# Patient Record
Sex: Female | Born: 1935
Health system: Southern US, Community
[De-identification: ages and names within clinical notes are randomized; demographics above are authoritative.]

## PROBLEM LIST (undated history)

## (undated) DIAGNOSIS — G8929 Other chronic pain: Secondary | ICD-10-CM

## (undated) DIAGNOSIS — E7800 Pure hypercholesterolemia, unspecified: Secondary | ICD-10-CM

## (undated) DIAGNOSIS — M2040 Other hammer toe(s) (acquired), unspecified foot: Secondary | ICD-10-CM

## (undated) DIAGNOSIS — I482 Chronic atrial fibrillation, unspecified: Secondary | ICD-10-CM

## (undated) DIAGNOSIS — F329 Major depressive disorder, single episode, unspecified: Secondary | ICD-10-CM

## (undated) DIAGNOSIS — I340 Nonrheumatic mitral (valve) insufficiency: Secondary | ICD-10-CM

## (undated) DIAGNOSIS — M1990 Unspecified osteoarthritis, unspecified site: Secondary | ICD-10-CM

## (undated) DIAGNOSIS — R296 Repeated falls: Secondary | ICD-10-CM

## (undated) DIAGNOSIS — F32A Depression, unspecified: Secondary | ICD-10-CM

## (undated) DIAGNOSIS — M549 Dorsalgia, unspecified: Secondary | ICD-10-CM

## (undated) DIAGNOSIS — I10 Essential (primary) hypertension: Secondary | ICD-10-CM

## (undated) DIAGNOSIS — I2510 Atherosclerotic heart disease of native coronary artery without angina pectoris: Secondary | ICD-10-CM

## (undated) DIAGNOSIS — E785 Hyperlipidemia, unspecified: Secondary | ICD-10-CM

## (undated) DIAGNOSIS — I4821 Permanent atrial fibrillation: Secondary | ICD-10-CM

## (undated) DIAGNOSIS — Z9289 Personal history of other medical treatment: Secondary | ICD-10-CM

## (undated) DIAGNOSIS — D649 Anemia, unspecified: Secondary | ICD-10-CM

## (undated) DIAGNOSIS — I119 Hypertensive heart disease without heart failure: Secondary | ICD-10-CM

## (undated) DIAGNOSIS — H3530 Unspecified macular degeneration: Secondary | ICD-10-CM

## (undated) DIAGNOSIS — J189 Pneumonia, unspecified organism: Secondary | ICD-10-CM

## (undated) DIAGNOSIS — Z7901 Long term (current) use of anticoagulants: Secondary | ICD-10-CM

## (undated) DIAGNOSIS — I272 Pulmonary hypertension, unspecified: Secondary | ICD-10-CM

## (undated) HISTORY — DX: Chronic atrial fibrillation, unspecified: I48.20

## (undated) HISTORY — PX: TOTAL HIP ARTHROPLASTY: SHX124

## (undated) HISTORY — PX: CARDIAC CATHETERIZATION: SHX172

## (undated) HISTORY — DX: Depression, unspecified: F32.A

## (undated) HISTORY — DX: Pulmonary hypertension, unspecified: I27.20

## (undated) HISTORY — DX: Dorsalgia, unspecified: M54.9

## (undated) HISTORY — DX: Major depressive disorder, single episode, unspecified: F32.9

## (undated) HISTORY — DX: Other chronic pain: G89.29

## (undated) HISTORY — DX: Pure hypercholesterolemia, unspecified: E78.00

## (undated) HISTORY — PX: ABDOMINAL HYSTERECTOMY: SHX81

## (undated) HISTORY — DX: Hypertensive heart disease without heart failure: I11.9

## (undated) HISTORY — DX: Nonrheumatic mitral (valve) insufficiency: I34.0

---

## 2001-11-07 HISTORY — PX: CATARACT EXTRACTION W/ INTRAOCULAR LENS  IMPLANT, BILATERAL: SHX1307

## 2002-02-04 ENCOUNTER — Ambulatory Visit (HOSPITAL_COMMUNITY): Admission: RE | Admit: 2002-02-04 | Discharge: 2002-02-04 | Payer: Self-pay | Admitting: Ophthalmology

## 2002-02-04 ENCOUNTER — Encounter: Payer: Self-pay | Admitting: Ophthalmology

## 2002-02-04 HISTORY — PX: PARS PLANA VITRECTOMY W/ REPAIR OF MACULAR HOLE: SHX2170

## 2002-02-20 ENCOUNTER — Other Ambulatory Visit: Admission: RE | Admit: 2002-02-20 | Discharge: 2002-02-20 | Payer: Self-pay | Admitting: Obstetrics and Gynecology

## 2002-04-16 ENCOUNTER — Encounter: Payer: Self-pay | Admitting: Obstetrics and Gynecology

## 2002-04-16 ENCOUNTER — Encounter: Admission: RE | Admit: 2002-04-16 | Discharge: 2002-04-16 | Payer: Self-pay | Admitting: Obstetrics and Gynecology

## 2002-05-03 ENCOUNTER — Ambulatory Visit: Admission: RE | Admit: 2002-05-03 | Discharge: 2002-05-03 | Payer: Self-pay | Admitting: Obstetrics and Gynecology

## 2002-05-03 HISTORY — PX: OTHER SURGICAL HISTORY: SHX169

## 2004-05-20 ENCOUNTER — Encounter: Admission: RE | Admit: 2004-05-20 | Discharge: 2004-05-20 | Payer: Self-pay | Admitting: Obstetrics and Gynecology

## 2004-05-31 ENCOUNTER — Ambulatory Visit (HOSPITAL_COMMUNITY): Admission: RE | Admit: 2004-05-31 | Discharge: 2004-05-31 | Payer: Self-pay | Admitting: Gastroenterology

## 2005-04-28 ENCOUNTER — Encounter: Admission: RE | Admit: 2005-04-28 | Discharge: 2005-04-28 | Payer: Self-pay | Admitting: Orthopedic Surgery

## 2005-05-05 ENCOUNTER — Observation Stay (HOSPITAL_COMMUNITY): Admission: RE | Admit: 2005-05-05 | Discharge: 2005-05-06 | Payer: Self-pay | Admitting: Orthopedic Surgery

## 2005-05-05 HISTORY — PX: OTHER SURGICAL HISTORY: SHX169

## 2005-12-28 ENCOUNTER — Encounter
Admission: RE | Admit: 2005-12-28 | Discharge: 2005-12-28 | Payer: Self-pay | Admitting: Physical Medicine and Rehabilitation

## 2006-05-22 ENCOUNTER — Encounter: Admission: RE | Admit: 2006-05-22 | Discharge: 2006-05-22 | Payer: Self-pay | Admitting: Obstetrics and Gynecology

## 2008-01-15 ENCOUNTER — Emergency Department (HOSPITAL_COMMUNITY): Admission: EM | Admit: 2008-01-15 | Discharge: 2008-01-15 | Payer: Self-pay | Admitting: Emergency Medicine

## 2008-02-26 ENCOUNTER — Encounter: Admission: RE | Admit: 2008-02-26 | Discharge: 2008-02-26 | Payer: Self-pay | Admitting: Internal Medicine

## 2008-05-22 ENCOUNTER — Encounter: Admission: RE | Admit: 2008-05-22 | Discharge: 2008-05-22 | Payer: Self-pay | Admitting: Obstetrics and Gynecology

## 2008-06-05 ENCOUNTER — Encounter: Admission: RE | Admit: 2008-06-05 | Discharge: 2008-06-05 | Payer: Self-pay | Admitting: Internal Medicine

## 2008-06-05 IMAGING — CR DG CHEST 2V
2 series · 2 of 2 positions shown · non-contrast
Comparison: PA and lateral chest [DATE].

CLINICAL DATA: Shortness of breath.

CHEST - 2 VIEW

[view not recorded (1 of 2)]
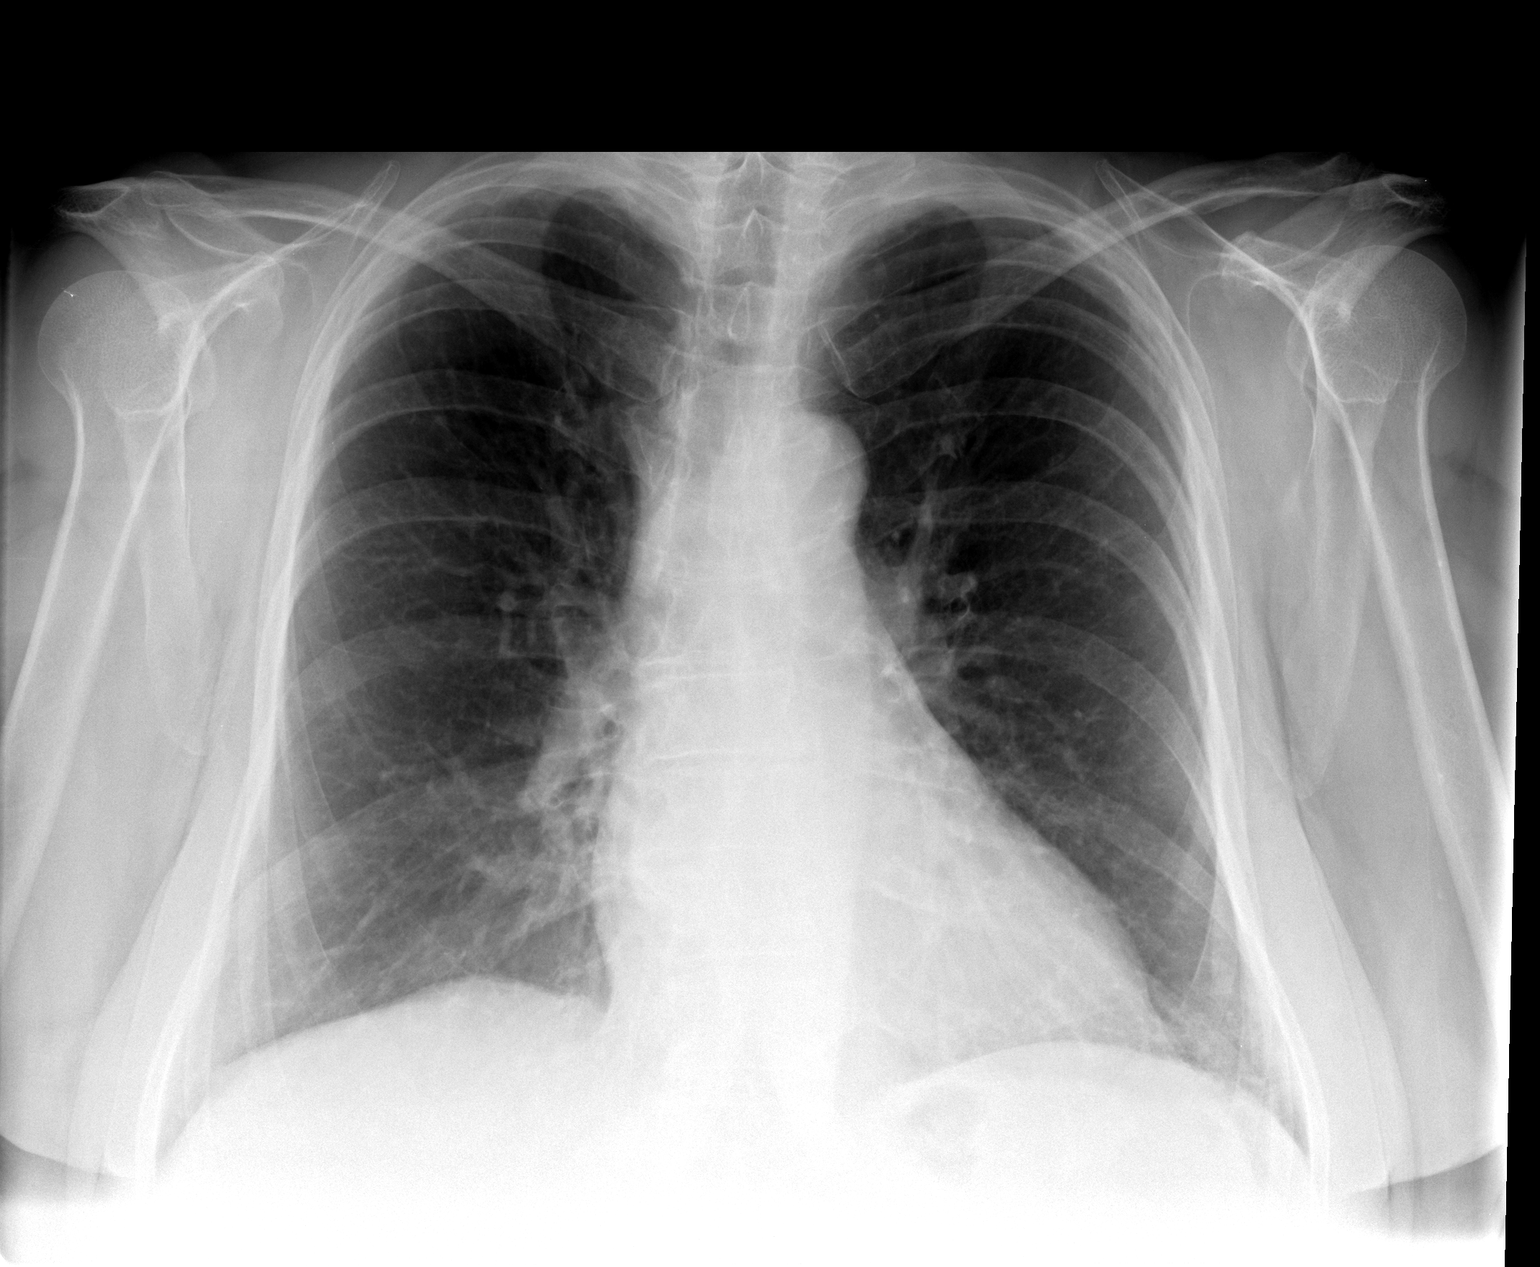

[view not recorded (2 of 2)]
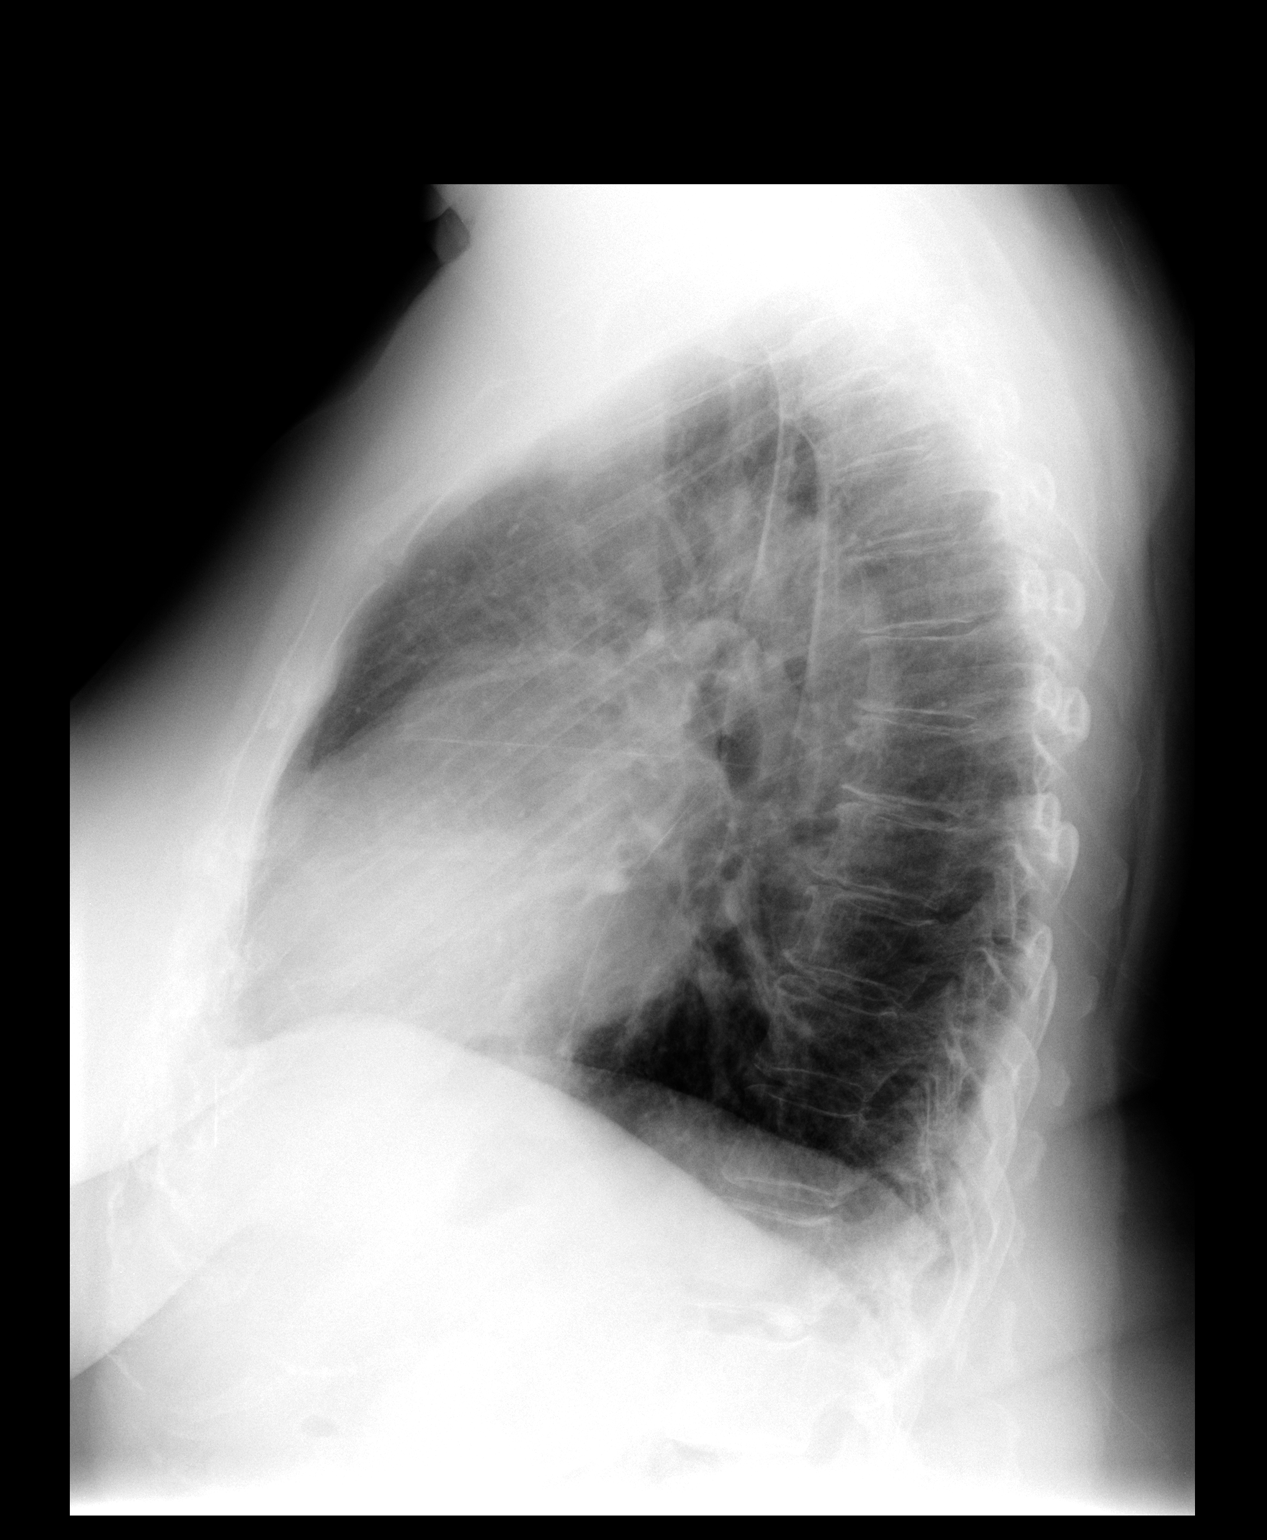

[2 of 2 positions shown; findings below may reference images not displayed]

FINDINGS: There is some minimal left base atelectasis or scar.
Lungs otherwise clear.  No effusion.  Heart size normal.
IMPRESSION: No acute disease.

## 2008-06-23 ENCOUNTER — Encounter: Admission: RE | Admit: 2008-06-23 | Discharge: 2008-06-23 | Payer: Self-pay | Admitting: Internal Medicine

## 2008-06-27 ENCOUNTER — Emergency Department (HOSPITAL_COMMUNITY): Admission: EM | Admit: 2008-06-27 | Discharge: 2008-06-28 | Payer: Self-pay | Admitting: Emergency Medicine

## 2008-09-25 ENCOUNTER — Other Ambulatory Visit: Payer: Self-pay | Admitting: Orthopedic Surgery

## 2008-10-27 ENCOUNTER — Ambulatory Visit (HOSPITAL_COMMUNITY): Admission: RE | Admit: 2008-10-27 | Discharge: 2008-10-27 | Payer: Self-pay | Admitting: Orthopedic Surgery

## 2008-10-27 HISTORY — PX: OTHER SURGICAL HISTORY: SHX169

## 2009-01-09 ENCOUNTER — Encounter: Admission: RE | Admit: 2009-01-09 | Discharge: 2009-01-09 | Payer: Self-pay | Admitting: Internal Medicine

## 2009-01-26 ENCOUNTER — Ambulatory Visit (HOSPITAL_COMMUNITY): Admission: RE | Admit: 2009-01-26 | Discharge: 2009-01-26 | Payer: Self-pay | Admitting: Cardiology

## 2009-02-03 ENCOUNTER — Inpatient Hospital Stay (HOSPITAL_BASED_OUTPATIENT_CLINIC_OR_DEPARTMENT_OTHER): Admission: RE | Admit: 2009-02-03 | Discharge: 2009-02-03 | Payer: Self-pay | Admitting: Cardiology

## 2009-02-17 ENCOUNTER — Encounter: Payer: Self-pay | Admitting: Cardiology

## 2009-02-17 ENCOUNTER — Ambulatory Visit (HOSPITAL_COMMUNITY): Admission: RE | Admit: 2009-02-17 | Discharge: 2009-02-17 | Payer: Self-pay | Admitting: Cardiology

## 2009-11-22 IMAGING — US US CAROTID DUPLEX BILAT
1 series · 13 of 24 positions shown · non-contrast
Comparison: None available

CLINICAL DATA: HTN; ;

BILATERAL CAROTID DUPLEX ULTRASOUND
TECHNIQUE: Gray scale imaging, color Doppler and duplex ultrasound
was performed of bilateral carotid and vertebral arteries in the
neck.

[Series 1: us carotid duplex bilat · 0.08mm/px · 13 of 55 slices shown]
[im 1/55]
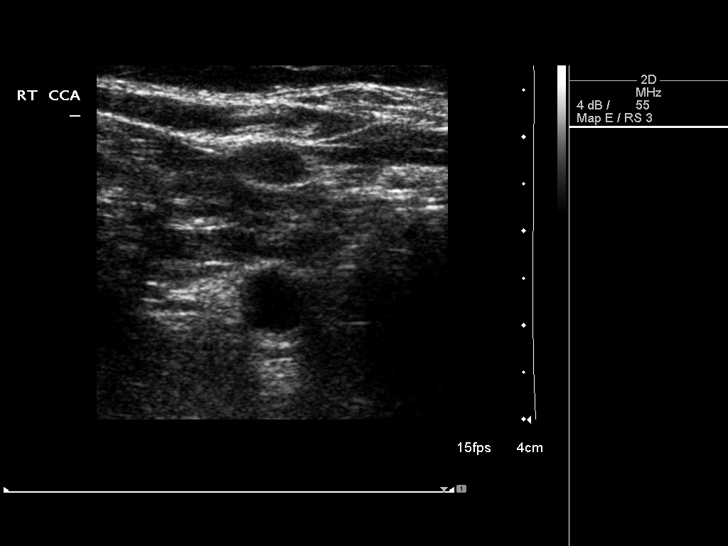
[im 5/55]
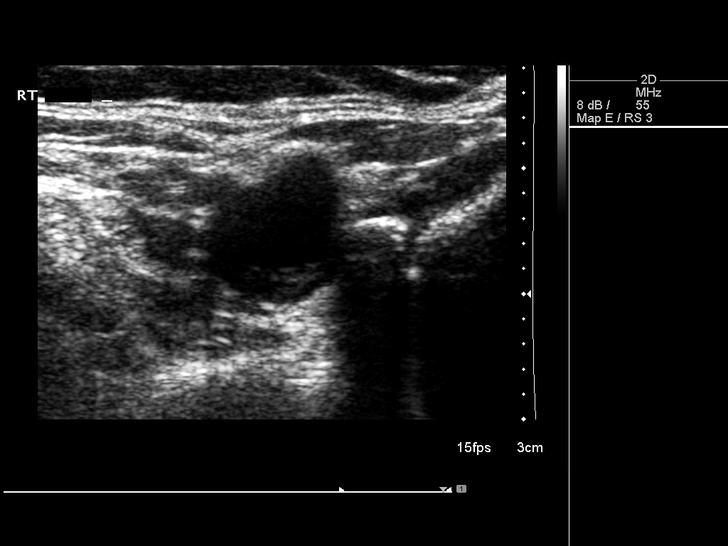
[im 10/55]
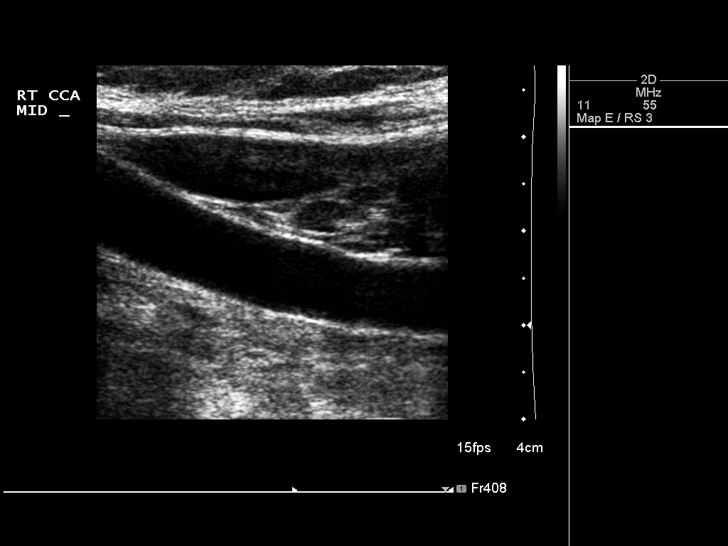
[im 15/55]
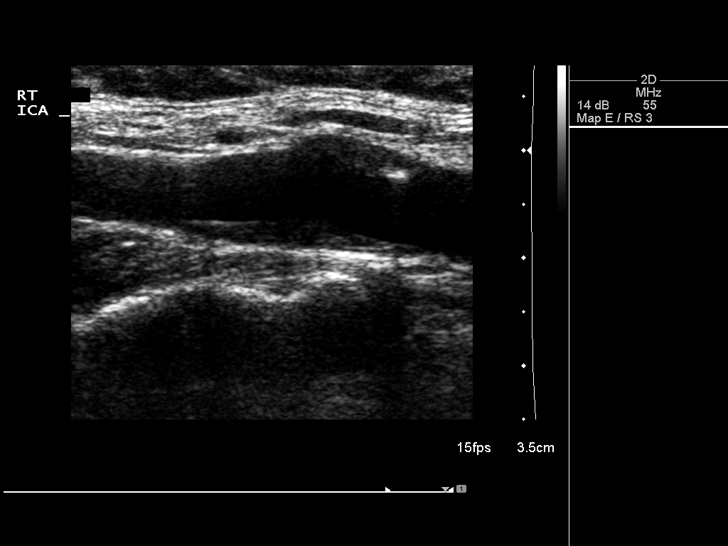
[im 19/55]
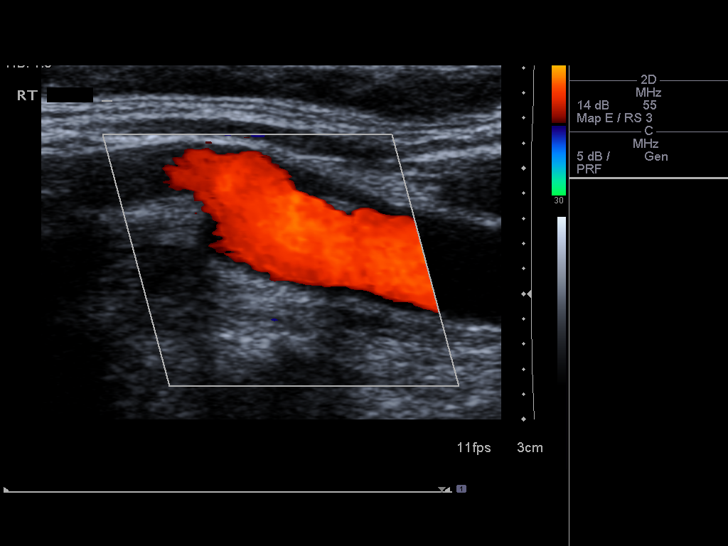
[im 24/55]
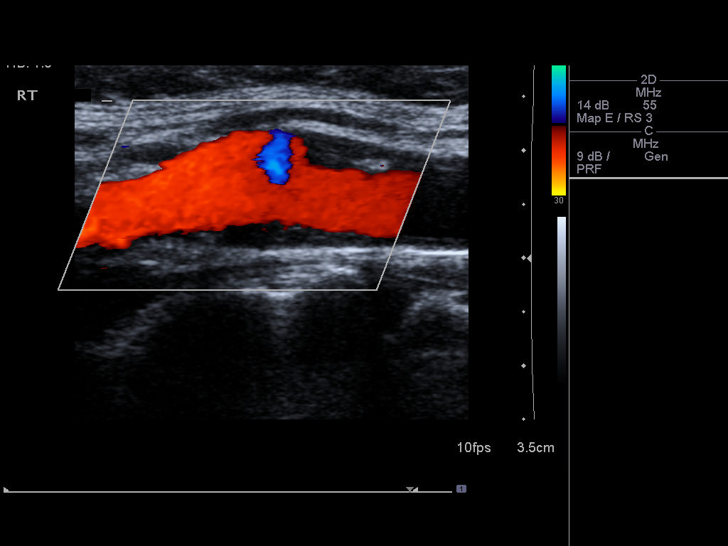
[im 29/55]
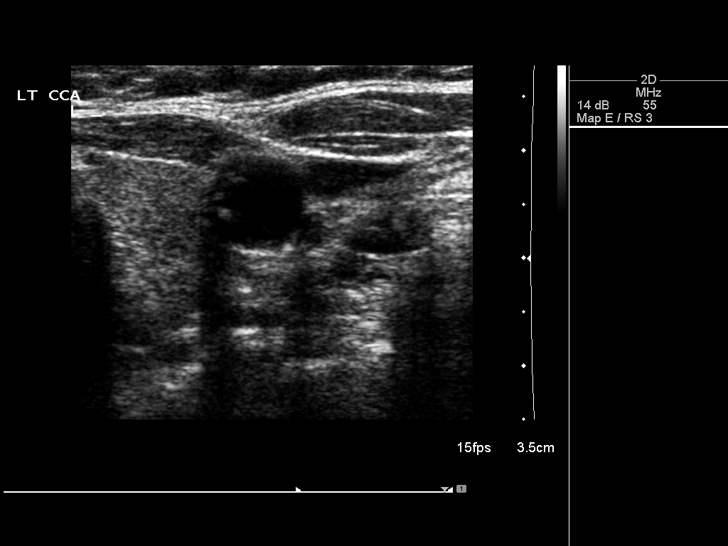
[im 31/55]
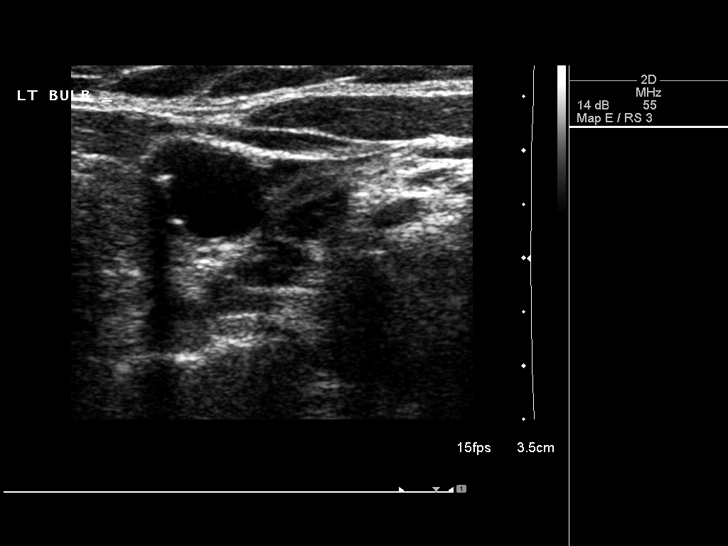
[im 36/55]
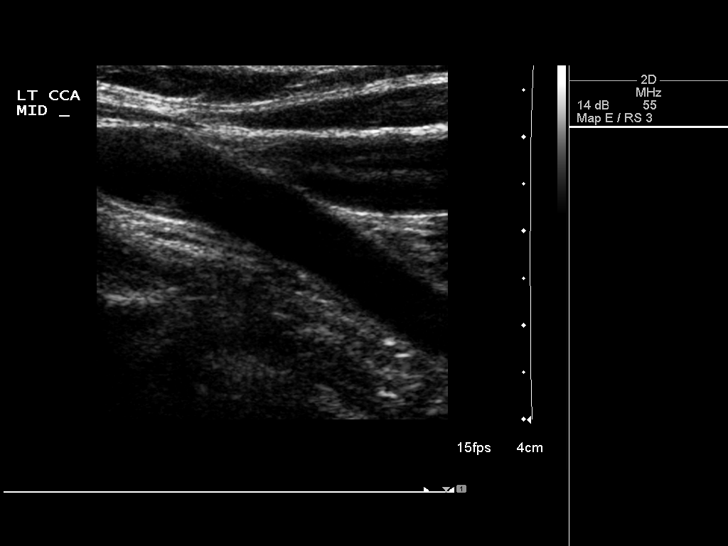
[im 40/55]
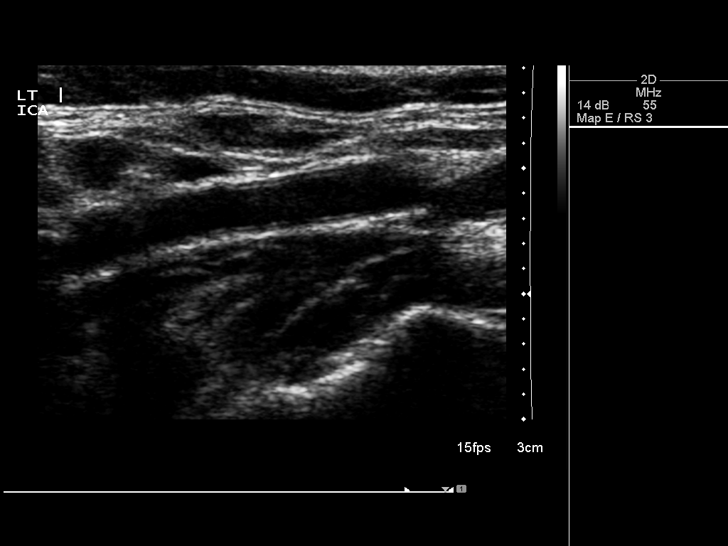
[im 45/55]
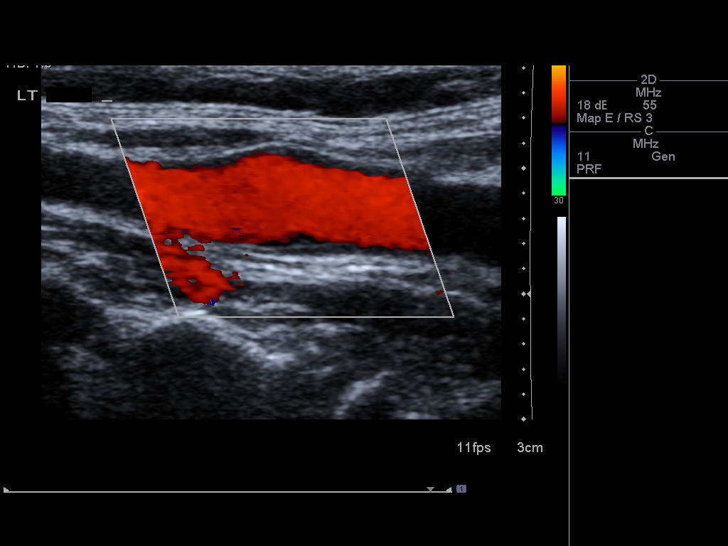
[im 50/55]
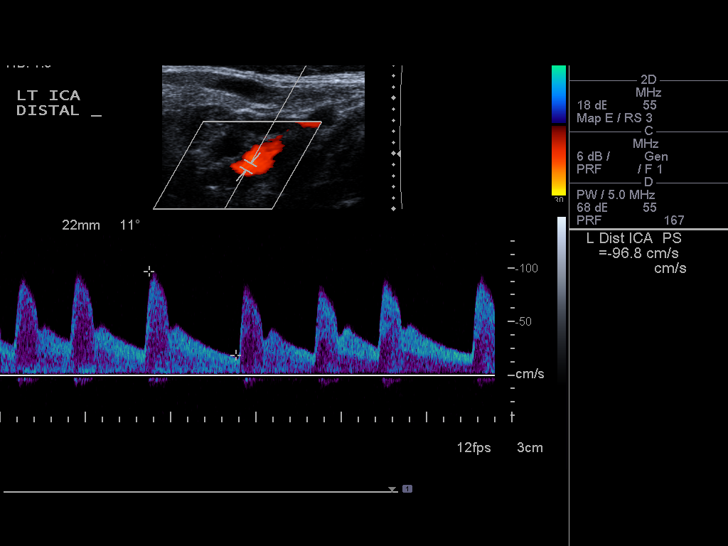
[im 55/55]
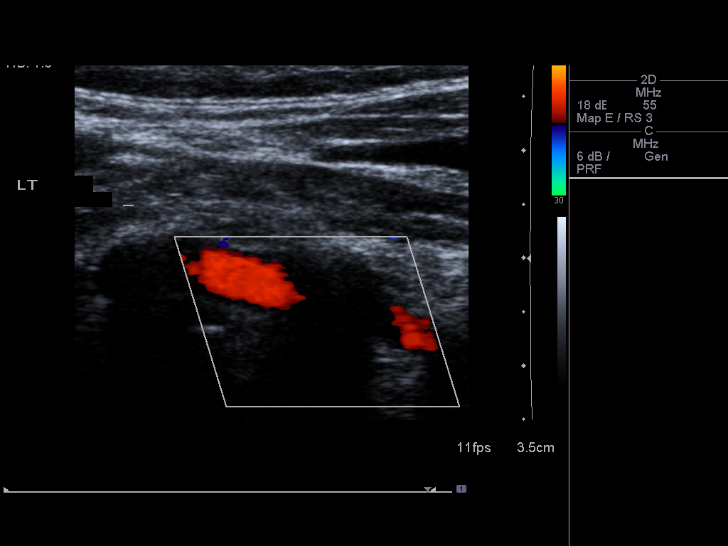

[13 of 24 positions shown; findings below may reference images not displayed]

Criteria:  Quantification of carotid stenosis is based on velocity
parameters that correlate the residual internal carotid diameter
with NASCET-based stenosis levels.

The following velocity measurements were obtained:

                 PEAK SYSTOLIC/END DIASTOLIC
RIGHT
ICA:                        75/18cm/sec
CCA:                        64/16cm/sec
SYSTOLIC ICA/CCA RATIO:
DIASTOLIC ICA/CCA RATIO:
ECA:                        73cm/sec

LEFT
ICA:                        97/19cm/sec
CCA:                        64/15cm/sec
SYSTOLIC ICA/CCA RATIO:
DIASTOLIC ICA/CCA RATIO:
ECA:                        83cm/sec
FINDINGS: RIGHT CAROTID ARTERY: Mild partially calcified carotid bifurcation
plaque without significant stenosis.

RIGHT VERTEBRAL ARTERY:  Normal flow direction and wave form

LEFT CAROTID ARTERY: Mild partially calcified carotid bifurcation
plaque extending to the proximal external carotid artery without
significant stenosis.

LEFT VERTEBRAL ARTERY:  Normal flow direction and wave form
IMPRESSION: 1.  Bilateral carotid bifurcation plaque without hemodynamically
significant stenosis.  Continued surveillance recommended.

## 2010-01-17 ENCOUNTER — Emergency Department (HOSPITAL_COMMUNITY): Admission: EM | Admit: 2010-01-17 | Discharge: 2010-01-17 | Payer: Self-pay | Admitting: Emergency Medicine

## 2010-03-25 ENCOUNTER — Encounter: Admission: RE | Admit: 2010-03-25 | Discharge: 2010-03-25 | Payer: Self-pay | Admitting: Cardiology

## 2010-06-07 ENCOUNTER — Ambulatory Visit: Payer: Self-pay | Admitting: Cardiology

## 2010-06-09 ENCOUNTER — Ambulatory Visit: Payer: Self-pay | Admitting: Cardiology

## 2010-06-19 ENCOUNTER — Emergency Department (HOSPITAL_COMMUNITY): Admission: EM | Admit: 2010-06-19 | Discharge: 2010-06-19 | Payer: Self-pay | Admitting: Emergency Medicine

## 2010-06-21 ENCOUNTER — Ambulatory Visit: Payer: Self-pay | Admitting: Cardiology

## 2010-06-29 ENCOUNTER — Ambulatory Visit: Payer: Self-pay | Admitting: Internal Medicine

## 2010-07-05 ENCOUNTER — Ambulatory Visit: Payer: Self-pay | Admitting: Cardiology

## 2010-07-19 ENCOUNTER — Ambulatory Visit: Payer: Self-pay | Admitting: Cardiology

## 2010-07-30 ENCOUNTER — Ambulatory Visit: Payer: Self-pay | Admitting: Cardiology

## 2010-08-09 ENCOUNTER — Ambulatory Visit: Payer: Self-pay | Admitting: Cardiology

## 2010-08-25 ENCOUNTER — Ambulatory Visit: Payer: Self-pay | Admitting: Cardiology

## 2010-09-22 ENCOUNTER — Ambulatory Visit: Payer: Self-pay | Admitting: Cardiology

## 2010-10-06 ENCOUNTER — Ambulatory Visit: Payer: Self-pay | Admitting: Cardiology

## 2010-10-13 ENCOUNTER — Ambulatory Visit: Payer: Self-pay | Admitting: Cardiology

## 2010-10-13 ENCOUNTER — Encounter
Admission: RE | Admit: 2010-10-13 | Discharge: 2010-10-13 | Payer: Self-pay | Source: Home / Self Care | Admitting: Obstetrics and Gynecology

## 2010-11-02 ENCOUNTER — Ambulatory Visit: Payer: Self-pay | Admitting: Cardiology

## 2010-11-24 ENCOUNTER — Ambulatory Visit: Payer: Self-pay | Admitting: Cardiology

## 2010-12-07 ENCOUNTER — Ambulatory Visit: Payer: Self-pay | Admitting: Cardiology

## 2010-12-07 NOTE — Assessment & Plan Note (Signed)
Summary: Pulmonary/ new pt eval, atypical sob/hoarseness ?ACE?   Visit Type:  Initial Consult Copy to:  Dr. Peter Sw[place]iland Primary Provider/Referring Provider:  Dr. Andi Devon  CC:  Dyspnea.  History of Present Illness: 51 yowf never smoker with HBP on ACE complicated by CAF and intermittent doe since 2010  June 29, 2010  1st pulmonary office eval c/o sev year h/o variable  sob worse on hot days and can do unlimited activity as long as cool and paces herself.  Hoarse for years and doesn't bother her. Worse with laughing or lots of voice use. Denies excess cough.   Pt denies any significant sore throat, dysphagia, itching, sneezing,  nasal congestion or excess secretions,  fever, chills, sweats, unintended wt loss, pleuritic or exertional cp, hempoptysis, orthopnea pnd or leg swelling Pt also denies any obvious fluctuation in symptoms with weather or environmental change or other alleviating or aggravating factors x for weather effects as above  Has never used inhalers  Current Medications (verified): 1)  Lisinopril 20 Mg Tabs (Lisinopril) .Marland Kitchen.. 1 Once Daily 2)  Lipitor 20 Mg Tabs (Atorvastatin Calcium) .... Take 1 Tablet By Mouth Once A Day 3)  Vitamin D .... Take 1 Tablet By Mouth Once A Day 4)  Vitamin B-6 100 Mg Tabs (Pyridoxine Hcl) .... Take 1 Tablet By Mouth Once A Day 5)  Vitamin C 1000 Mg Tabs (Ascorbic Acid) .... Take 1 Tablet By Mouth Once A Day 6)  Paxil Cr 37.5 Mg Xr24h-Tab (Paroxetine Hcl) .... Take 1 Tablet By Mouth Once A Day 7)  Vitamin A .... Take 1 Tablet By Mouth Once A Day 8)  Calcium + Vitamin D .... Take 1 Tablet By Mouth Once A Day 9)  Magnesium Oxide 400 Mg Tabs (Magnesium Oxide) .... Take 1 Tablet By Mouth Once A Day 10)  Zinc .... Take 1 Tablet By Mouth Once A Day 11)  Omega 3 Fish Oil .... Take 1 Tablet By Mouth Once A Day 12)  Metoprolol Succinate 50 Mg Xr24h-Tab (Metoprolol Succinate) .Marland Kitchen.. 1 At Bedtime 13)  Biotin 1000 Mcg Tabs (Biotin) .... Take 1  Tablet By Mouth Once A Day 14)  Hydrocodone-Acetaminophen 10-325 Mg Tabs (Hydrocodone-Acetaminophen) .... As Needed 15)  Furosemide 40 Mg Tabs (Furosemide) .... Take 2 Tabs By Mouth Each Morning and 1 Tab By Mouth Each Evening 16)  Coumadin 5 Mg Tabs (Warfarin Sodium) .... Take As Directed 17)  Cardizem 120 Mg Tabs (Diltiazem Hcl) .... Take 1 Tablet By Mouth Once A Day 18)  Premarin 1.25 Mg Tabs (Estrogens Conjugated) .... Take 1 Tablet By Mouth Once A Day 19)  Neurontin 100 Mg Caps (Gabapentin) .Marland Kitchen.. 1 Every Evening 20)  Potassium Gluconate 595 Mg Tabs (Potassium Gluconate) .... 3 Once Daily  Allergies: 1)  ! Sulfa 2)  ! Codeine  Past History:  Past Medical History: BACK PAIN, CHRONIC (ICD-724.5) HYPERCHOLESTEROLEMIA (ICD-272.0) HYPERTENSION, PULMONARY (ICD-416.8) ATRIAL FIBRILLATION (ICD-427.31) HYPERTENSIVE CARDIOVASCULAR DISEASE (ICD-402.90) MITRAL INSUFFICIENCY (ICD-396.3)...............................................Marland KitchenSwaziland    - ECHO 5/13/11Mild conc lvh, mild as, mild lae, mod pulmonary hypertension PAH     - RIGHT Ht cath 02/03/09 PA mean 39 vs Wedge 24 C0 4.4 so PVR = 272 (nl < 250) SPN  RLL by CT only    -See CT 03/25/10, c/w atx Unexplained sob/ hoarseness....................................................Marland KitchenWert     - PFT's 01/26/09 FEV1 1.59 ( 80%) ratio 73 no insp truncation    Family History: father : deceased with prostate cancer at age 88 mother deceased at age 24  with coronary disease Negative for respiratory diseases or atopy   Social History: pt is a retired Environmental health practitioner. pt is divorced. pt has 3 children. Never smoker  Review of Systems       The patient complains of shortness of breath with activity and joint stiffness or pain.  The patient denies shortness of breath at rest, productive cough, non-productive cough, coughing up blood, chest pain, irregular heartbeats, acid heartburn, indigestion, loss of appetite, weight change, abdominal pain,  difficulty swallowing, sore throat, tooth/dental problems, headaches, nasal congestion/difficulty breathing through nose, sneezing, itching, ear ache, anxiety, depression, hand/feet swelling, rash, change in color of mucus, and fever.    Vital Signs:  Patient profile:   77 year old female Height:      65 inches Weight:      173.25 pounds BMI:     28.93 O2 Sat:      91 % on Room air Temp:     98.1 degrees F oral Pulse rate:   95 / minute BP sitting:   122 / 60  (left arm)  Vitals Entered By: Vernie Murders (June 29, 2010 11:31 AM)  O2 Flow:  Room air  Physical Exam  Additional Exam:  amb very hoarse wf with classic pseudowheeze resolves with purse lip maneuver  wt 173 June 29, 2010 HEENT: nl dentition, turbinates, and orophanx. Nl external ear canals without cough reflex NECK :  without JVD/Nodes/TM/ nl carotid upstrokes bilaterally LUNGS: no acc muscle use, clear to A and P bilaterally without cough on insp or exp maneuvers CV:   IRR  no s3 or murmur or increase in P2, no edema  ABD:  soft and nontender with nl excursion in the supine position. No bruits or organomegaly, bowel sounds nl MS:  warm without deformities, calf tenderness, cyanosis or clubbing SKIN: warm and dry without lesions   NEURO:  alert, approp, no deficits     Impression & Recommendations:  Problem # 1:  HYPERTENSION, PULMONARY (ICD-416.8) Extremely mild and would not explain her variable sob or hoarseness, prob mostly related to valvular ht dz and lvh, no w/u needed at this point x perhaps overight pulse ox to r/o noct desat  Problem # 2:  DYSPNEA (ICD-786.09)  Quite variable and assoc with hoarseness so is most likely a form of  Upper airway cough syndrome, so named because it's frequently impossible to sort out how much is  CR/sinusitis with freq throat clearing (which can be related to primary GERD)   vs  causing  secondary extra esophageal GERD from wide swings in gastric pressure that occur with  throat clearing, promoting self use of mint and menthol lozenges that reduce the lower esophageal sphincter tone and exacerbate the problem further These are the same pts who not infrequently have failed to tolerate ace inhibitors,  dry powder inhalers or biphosphonates or report having reflux symptoms that don't respond to standard doses of PPI  I would avoid ace and treat possible gerd with both diet and ppi before I would consider treating her for primary airways dz given her age and lack of risk factors for asthma, including essentially nl pft's while symptomatic and the risk of worsening her afib.  I discussed all the options which she politely listened to then declined every one of them, especially when it comes to reducing or adjusting any over 20  medications so pulmonary f/u can be prn  Orders: New Patient Level V (16109)  Medications Added to Medication List This Visit:  1)  Lisinopril 20 Mg Tabs (Lisinopril) .Marland Kitchen.. 1 once daily 2)  Metoprolol Succinate 50 Mg Xr24h-tab (Metoprolol succinate) .Marland Kitchen.. 1 at bedtime 3)  Furosemide 40 Mg Tabs (Furosemide) .... Take 2 tabs by mouth each morning and 1 tab by mouth each evening 4)  Neurontin 100 Mg Caps (Gabapentin) .Marland Kitchen.. 1 every evening 5)  Potassium Gluconate 595 Mg Tabs (Potassium gluconate) .... 3 once daily   Patient Instructions: 1)  GERD (REFLUX)  is a common cause of respiratory symptoms. It commonly presents without heartburn and can be treated with medication, but also with lifestyle changes including avoidance of late meals, excessive alcohol, smoking cessation, and avoid fatty foods, chocolate, peppermint, colas, red wine, and acidic juices such as orange juice. NO MINT OR MENTHOL PRODUCTS SO NO COUGH DROPS  2)  USE SUGARLESS CANDY INSTEAD (jolley ranchers)  3)  NO OIL BASED VITAMINS (no fish oil) 4)  I strongly suspect the hoarseness and much of your breathing problems are caused by the medication you are taking with Lisnopril leading  the list  suspects  and would need to be subsituted for 6 weeks to see the difference  5)  We can see anytime on a same day basis Wert/Tammy NP if needed for breathing problems

## 2010-12-28 ENCOUNTER — Encounter (INDEPENDENT_AMBULATORY_CARE_PROVIDER_SITE_OTHER): Payer: Medicare Other

## 2010-12-28 DIAGNOSIS — I4891 Unspecified atrial fibrillation: Secondary | ICD-10-CM

## 2010-12-28 DIAGNOSIS — Z7901 Long term (current) use of anticoagulants: Secondary | ICD-10-CM

## 2011-01-24 ENCOUNTER — Encounter: Payer: Self-pay | Admitting: *Deleted

## 2011-01-24 DIAGNOSIS — I4891 Unspecified atrial fibrillation: Secondary | ICD-10-CM

## 2011-01-25 ENCOUNTER — Ambulatory Visit (INDEPENDENT_AMBULATORY_CARE_PROVIDER_SITE_OTHER): Payer: Medicare Other | Admitting: *Deleted

## 2011-01-25 DIAGNOSIS — Z7901 Long term (current) use of anticoagulants: Secondary | ICD-10-CM

## 2011-01-25 DIAGNOSIS — I4891 Unspecified atrial fibrillation: Secondary | ICD-10-CM

## 2011-01-25 LAB — POCT INR: INR: 1.4

## 2011-02-01 ENCOUNTER — Ambulatory Visit (INDEPENDENT_AMBULATORY_CARE_PROVIDER_SITE_OTHER): Payer: Medicare Other | Admitting: *Deleted

## 2011-02-01 DIAGNOSIS — Z7901 Long term (current) use of anticoagulants: Secondary | ICD-10-CM

## 2011-02-01 DIAGNOSIS — I4891 Unspecified atrial fibrillation: Secondary | ICD-10-CM

## 2011-02-01 LAB — POCT INR: INR: 1.5

## 2011-02-14 ENCOUNTER — Ambulatory Visit (INDEPENDENT_AMBULATORY_CARE_PROVIDER_SITE_OTHER): Payer: Medicare Other | Admitting: *Deleted

## 2011-02-14 DIAGNOSIS — Z7901 Long term (current) use of anticoagulants: Secondary | ICD-10-CM

## 2011-02-14 DIAGNOSIS — I4891 Unspecified atrial fibrillation: Secondary | ICD-10-CM

## 2011-02-14 LAB — POCT INR: INR: 2.6

## 2011-02-17 LAB — POCT I-STAT 3, VENOUS BLOOD GAS (G3P V)
Acid-base deficit: 1 mmol/L (ref 0.0–2.0)
Bicarbonate: 24.6 mEq/L — ABNORMAL HIGH (ref 20.0–24.0)
Bicarbonate: 25.7 mEq/L — ABNORMAL HIGH (ref 20.0–24.0)
O2 Saturation: 59 %
pCO2, Ven: 42.6 mmHg — ABNORMAL LOW (ref 45.0–50.0)
pCO2, Ven: 43.3 mmHg — ABNORMAL LOW (ref 45.0–50.0)
pH, Ven: 7.362 — ABNORMAL HIGH (ref 7.250–7.300)
pH, Ven: 7.389 — ABNORMAL HIGH (ref 7.250–7.300)
pO2, Ven: 32 mmHg (ref 30.0–45.0)
pO2, Ven: 32 mmHg (ref 30.0–45.0)

## 2011-02-17 LAB — POCT I-STAT 3, ART BLOOD GAS (G3+)
O2 Saturation: 91 %
pCO2 arterial: 37.3 mmHg (ref 35.0–45.0)
pH, Arterial: 7.424 — ABNORMAL HIGH (ref 7.350–7.400)
pO2, Arterial: 60 mmHg — ABNORMAL LOW (ref 80.0–100.0)

## 2011-02-22 ENCOUNTER — Encounter: Payer: Self-pay | Admitting: Cardiology

## 2011-02-22 DIAGNOSIS — I272 Pulmonary hypertension, unspecified: Secondary | ICD-10-CM | POA: Insufficient documentation

## 2011-02-22 DIAGNOSIS — I340 Nonrheumatic mitral (valve) insufficiency: Secondary | ICD-10-CM | POA: Insufficient documentation

## 2011-02-22 DIAGNOSIS — E785 Hyperlipidemia, unspecified: Secondary | ICD-10-CM | POA: Insufficient documentation

## 2011-02-22 DIAGNOSIS — M549 Dorsalgia, unspecified: Secondary | ICD-10-CM | POA: Insufficient documentation

## 2011-02-22 DIAGNOSIS — I119 Hypertensive heart disease without heart failure: Secondary | ICD-10-CM | POA: Insufficient documentation

## 2011-02-22 DIAGNOSIS — F329 Major depressive disorder, single episode, unspecified: Secondary | ICD-10-CM | POA: Insufficient documentation

## 2011-02-22 DIAGNOSIS — I10 Essential (primary) hypertension: Secondary | ICD-10-CM | POA: Insufficient documentation

## 2011-02-22 DIAGNOSIS — I482 Chronic atrial fibrillation, unspecified: Secondary | ICD-10-CM | POA: Insufficient documentation

## 2011-02-24 ENCOUNTER — Other Ambulatory Visit: Payer: Self-pay | Admitting: *Deleted

## 2011-02-24 ENCOUNTER — Ambulatory Visit (INDEPENDENT_AMBULATORY_CARE_PROVIDER_SITE_OTHER): Payer: Medicare Other | Admitting: Cardiology

## 2011-02-24 ENCOUNTER — Encounter: Payer: Self-pay | Admitting: Cardiology

## 2011-02-24 DIAGNOSIS — Z7901 Long term (current) use of anticoagulants: Secondary | ICD-10-CM

## 2011-02-24 DIAGNOSIS — I4891 Unspecified atrial fibrillation: Secondary | ICD-10-CM

## 2011-02-24 DIAGNOSIS — I482 Chronic atrial fibrillation, unspecified: Secondary | ICD-10-CM

## 2011-02-24 DIAGNOSIS — I059 Rheumatic mitral valve disease, unspecified: Secondary | ICD-10-CM

## 2011-02-24 DIAGNOSIS — I119 Hypertensive heart disease without heart failure: Secondary | ICD-10-CM

## 2011-02-24 DIAGNOSIS — I340 Nonrheumatic mitral (valve) insufficiency: Secondary | ICD-10-CM

## 2011-02-24 MED ORDER — DILTIAZEM HCL 120 MG PO TABS: 120.0000 mg | ORAL_TABLET | Freq: Every day | ORAL | Status: DC

## 2011-02-24 NOTE — Patient Instructions (Signed)
We will refill your diltiazem.  Continue your current medications.  We will see you back in 4 months and get fasting lab work.

## 2011-02-24 NOTE — Telephone Encounter (Signed)
Called for refill on cardizem;sent

## 2011-02-25 DIAGNOSIS — Z7901 Long term (current) use of anticoagulants: Secondary | ICD-10-CM | POA: Insufficient documentation

## 2011-02-25 NOTE — Assessment & Plan Note (Signed)
She is well compensated on exam without significant dyspnea. We will continue with her current medical therapy.

## 2011-02-25 NOTE — Assessment & Plan Note (Signed)
Rate is well controlled and she is therapeutic on her anticoagulation. She is scheduled for followup INR next week.

## 2011-02-25 NOTE — Progress Notes (Signed)
[name] Date of Birth: Aug 20, 1932   History of Present Illness: [name] is seen today for followup. She states she's doing very well. She remains reactive with her exercise program. She states the pollen doesn't really bother her very much but she has more trouble breathing when the humidity is high. She's had no recent bronchitic symptoms. She denies any chest pain.  Current Outpatient Prescriptions on File Prior to Visit  Medication Sig Dispense Refill  . Ascorbic Acid (VITAMIN C) 1000 MG tablet Take 1,000 mg by mouth daily.        Marland Kitchen atorvastatin (LIPITOR) 20 MG tablet Take 1 tablet by mouth Daily.      . Biotin 1000 MCG tablet Take 1,000 mcg by mouth daily.        . Calcium Carbonate-Vitamin D (CALCIUM + D PO) Take by mouth daily.        . Cholecalciferol (VITAMIN D PO) Take by mouth daily.        Marland Kitchen diltiazem (CARDIZEM) 120 MG tablet Take 1 tablet (120 mg total) by mouth daily.  30 tablet  5  . estrogens, conjugated, (PREMARIN) 1.25 MG tablet Take 1.25 mg by mouth daily.        . furosemide (LASIX) 40 MG tablet Take 40 mg by mouth daily. 2 PILLS IN THE AM, 1 PILL IN THE PM       . gabapentin (NEURONTIN) 100 MG capsule Take 1 tablet by mouth Daily.      Marland Kitchen HYDROcodone-acetaminophen (NORCO) 10-325 MG per tablet Take 1 tablet by mouth every 6 (six) hours as needed.        Marland Kitchen lisinopril (PRINIVIL,ZESTRIL) 20 MG tablet Take 1 tablet by mouth Daily.      Marland Kitchen lisinopril-hydrochlorothiazide (PRINZIDE,ZESTORETIC) 20-25 MG per tablet Take 1 tablet by mouth daily.        . Magnesium 400 MG CAPS Take by mouth daily.        . Multiple Vitamins-Minerals (ZINC PO) Take by mouth daily.        Marland Kitchen PARoxetine (PAXIL-CR) 37.5 MG 24 hr tablet Take 37.5 mg by mouth every morning.        . Potassium (POTASSIMIN PO) Take 595 mg by mouth daily. 3 TABLETS DAILY      . pyridOXINE (VITAMIN B-6) 100 MG tablet Take 100 mg by mouth daily.        . vitamin A 8000 UNIT capsule Take 8,000 Units by mouth  daily.        Marland Kitchen warfarin (COUMADIN) 5 MG tablet Take by mouth as directed.        Marland Kitchen DISCONTD: diltiazem (CARDIZEM CD) 120 MG 24 hr capsule Take 1 tablet by mouth Daily.        Allergies  Allergen Reactions  . Codeine   . Penicillins   . Sulfonamide Derivatives     Past Medical History  Diagnosis Date  . Mitral insufficiency     CHRONIC  . Hypertensive heart disease     WITH LVH  . LVH (left ventricular hypertrophy) due to hypertensive disease   . Chronic atrial fibrillation   . Pulmonary hypertension   . Hypercholesterolemia   . Back pain, chronic   . Depression     Past Surgical History  Procedure Date  . Total hip arthroplasty     BILATERAL  . Bladder surgery   . Abdominal hysterectomy     History  Smoking status  . Never Smoker   Smokeless tobacco  .  Never Used    History  Alcohol Use No    Family History  Problem Relation Age of Onset  . Heart disease Mother 82  . Prostate cancer Father 33    Review of Systems:   All other systems were reviewed and are negative.  Physical Exam: BP 138/80  Pulse 88  Ht 5' 4.5" (1.638 m)  Wt 157 lb (71.215 kg)  BMI 26.53 kg/m2 She is a pleasant white female in no acute distress. Her HEENT exam is unremarkable. Sclera are clear. Oropharynx is clear. Neck is supple without JVD, adenopathy, thyromegaly, or bruits. Lungs are clear. Cardiac exam reveals a regular rate and rhythm without gallop. There is a grade 2/6 systolic murmur heard best at the left sternal border. Abdomen is soft and nontender. She has no significant edema. Pedal pulses are palpable. She is alert and oriented x3. Cranial nerves II through XII are intact.  LABORATORY DATA: Recent INR was 2.6.  Assessment / Plan:

## 2011-02-25 NOTE — Assessment & Plan Note (Signed)
Blood pressure is well controlled on current therapy 

## 2011-02-28 ENCOUNTER — Ambulatory Visit (INDEPENDENT_AMBULATORY_CARE_PROVIDER_SITE_OTHER): Payer: Medicare Other | Admitting: *Deleted

## 2011-02-28 ENCOUNTER — Encounter: Payer: Medicare Other | Admitting: *Deleted

## 2011-02-28 DIAGNOSIS — I4891 Unspecified atrial fibrillation: Secondary | ICD-10-CM

## 2011-03-18 ENCOUNTER — Ambulatory Visit (INDEPENDENT_AMBULATORY_CARE_PROVIDER_SITE_OTHER): Payer: Medicare Other | Admitting: *Deleted

## 2011-03-18 DIAGNOSIS — I4891 Unspecified atrial fibrillation: Secondary | ICD-10-CM

## 2011-03-18 LAB — POCT INR: INR: 3.7

## 2011-03-22 NOTE — Op Note (Signed)
[name], [name]           ACCOUNT NO.:  [id number]   MEDICAL RECORD NO.:  [id number]          PATIENT TYPE:  AMB   LOCATION:  DAY                          FACILITY:  [facility]   PHYSICIAN:  Marlowe Kays, M.D.  DATE OF BIRTH:  [birth year]   DATE OF PROCEDURE:  10/27/2008  DATE OF DISCHARGE:                               OPERATIVE REPORT   PREOPERATIVE DIAGNOSIS:  Painful callus, inner aspect second toe,  secondary valgus deformity great toe, left foot.   POSTOPERATIVE DIAGNOSIS:  Painful callus, inner aspect second toe,  secondary valgus deformity great toe, left foot.   OPERATION/PROCEDURE:  1. Varus osteotomy proximal phalanx, great toe.  2. Paring down the callus, second toe left foot.   SURGEON:  Marlowe Kays, M.D.   ASSISTANT:  Nurse.   ANESTHESIA:  General.   INDICATIONS:  She has small bunion which she did not desire to have  corrected.  She had valgus of the great toe and which was rubbing on the  inner aspect of the second toe.  It was thought that the above procedure  would be appropriate to correct her problems.  She had cardiac clearance  prior to her surgery.   DESCRIPTION OF PROCEDURE:  Prophylactic antibiotics.  She has bilateral  total hips.  Pneumatic tourniquet to 300 mmHg with the left leg  Esmarched out non-sterilely and prepped from midcalf to toes with  DuraPrep, draped in a sterile field.  Time-out performed.  I first  gently pared down the callus over the inner aspect of the second toe  with 15 knife blade.  I then made a midline incision over the proximal  phalanx of the great toe and using the mini C-arm, placed two guide  pins, one at roughly the metaphyseal flare and the other obliquely to  this until they were in good position.  I then used a micro saw  supplemented with a very small osteotome to remove a wedge of bone from  the inner side of the great toe.  I then cracked down the osteotomy and  placed at 2.045 smooth K-wires distally  and then proximally, stabilizing  the osteotomy position. Pins were confirmed on this with a a mini C-arm.  She had a little bit of gapping left medially and I placed some of the  cancellous bone from the osteotomy in the gap area after irrigating the  wound well with sterile saline and closed the periosteal fascial complex  over with interrupted 3-0 Vicryl and skin with interrupted 4-0 nylon  mattress sutures.  The pins of the toe were blocked with 0.5% plain  Marcaine.  The  pins were bent, cut and capped.  Betadine, Adaptic, dry sterile dressing  were applied.  Tourniquet was released.  She tolerated the procedure  well and was taken to the recovery room in satisfactory condition with  no known complications.           ______________________________  Marlowe Kays, M.D.     JA/MEDQ  D:  10/27/2008  T:  10/28/2008  Job:  [id number]

## 2011-03-22 NOTE — Cardiovascular Report (Signed)
NAMELAQUILLA, [name]           ACCOUNT NO.:  [id number]   MEDICAL RECORD NO.:  [id number]          PATIENT TYPE:  OIB   LOCATION:  1961                         FACILITY:  [facility]   PHYSICIAN:  Peter M. Swaziland, M.D.  DATE OF BIRTH:  [birth year]   DATE OF PROCEDURE:  02/03/2009  DATE OF DISCHARGE:  02/03/2009                            CARDIAC CATHETERIZATION   INDICATIONS FOR PROCEDURE:  A 77 year old white female who presents with  progressive dyspnea.  She has chronic persistent atrial fibrillation.  By echocardiogram, she had evidence of moderate mitral insufficiency  with severe pulmonary hypertension and severe tricuspid insufficiency.  Cardiac catheterization was indicated to further define her hemodynamic  status.   PROCEDURES:  1. Right and left heart catheterization.  2. Coronary left ventricular angiography.  3. Access via the right femoral artery and vein using standard      Seldinger technique equipment.  4. A 4-French 4 cm left Judkins catheter, 4-French 3-D RCA catheter, 4-      French pigtail catheter, 4-French arterial sheath, 5-French venous      sheath, with 5-French balloon-tip Swan-Ganz catheter.   MEDICATIONS:  Local anesthesia with 1% Xylocaine and Versed 2 mg IV.   CONTRAST:  120 mL of Omnipaque.   HEMODYNAMIC DATA:  Thermodilution cardiac output was 3 L/min with an  index of 1.6 L/min/m2.  Cardiac output by Fick was 4.4 with an index of  2.4 L/min/m2.  Right atrial saturation was 59%.  Pulmonary artery  saturation was 60% showing no evidence of step-up.  Aortic saturation  was 91%, right atrial pressure was 19/18 with a mean of 15 mmHg, right  ventricular pressure was 61 with an EDP of 19 mmHg, coronary artery  pressure was 56/24 with a mean of 39 mmHg.  Pulmonary capillary wedge  pressure is 28/33 with a mean of 24 mmHg, left ventricular pressure was  141 with EDP of 25 mmHg.  The aortic pressure was 141/81 with a mean of  106 mmHg.   ANGIOGRAPHIC  DATA:  Left ventricular angiography was performed in the  RAO view.  This demonstrates modest mitral annular calcification.  Left  ventricular size and contractility were normal with ejection fraction  estimated at 65%.  There appears to be severe mitral insufficiency with  reflux freely into the pulmonary veins.   The left coronary artery arises and distributes normally.  The left main  coronary artery is short and appears normal.   Left anterior descending artery, large vessel and appears normal  throughout its course.  There is a very small first diagonal branch,  which has a 80-90% stenosis proximally.   The left circumflex coronary artery is a large dominant vessel and is  normal throughout.   The right coronary artery is a small-nondominant vessel appears normal.   FINAL INTERPRETATION:  1. Single-vessel obstructive coronary artery disease involving very      small diagonal branch.  2. Normal left ventricular function.  3. Severe mitral insufficiency.  4. Severe pulmonary hypertension, did elevated left ventricular      filling pressures of mitral insufficiency.  ______________________________  Peter M. [place], M.D.     PMJ/MEDQ  D:  02/03/2009  T:  02/03/2009  Job:  [id number]   cc:   Merlene Laughter. Renae Gloss, M.D.

## 2011-03-22 NOTE — H&P (Signed)
[name], MELKONIAN NO.:  [id number]   MEDICAL RECORD NO.:  [id number]           PATIENT TYPE:   LOCATION:                                 FACILITY:   PHYSICIAN:  Peter M. Swaziland, M.D.  DATE OF BIRTH:  04/22/1932   DATE OF ADMISSION:  DATE OF DISCHARGE:                              HISTORY & PHYSICAL   HISTORY OF PRESENT ILLNESS:  [name] is a 77 year old white female  who was seen for evaluation of progressive dyspnea.  She has a history  of chronic persistent atrial fibrillation dating back to at least June  2009.  She has been on Toprol for rate control.  She had an  echocardiogram performed in July 2009, which was reportedly showing  normal left ventricular function with mild biatrial enlargement.  She  had moderate-to-mitral insufficiency and mild-to-moderate tricuspid  insufficiency at that time.  Her estimated right ventricular systolic  pressure was felt to be mildly elevated.  She also had a stress  Cardiolite study in December 2009, which she was able to exercise for  only 4.5 minutes on the Bruce protocol.  She was noted to have 1.5-2 mm  ST-segment depression, but the leads were not mentioned.  Her Cardiolite  images apparently demonstrated no perfusion abnormality and normal left  trick function.  The patient denies any symptoms of chest pain or  palpitations.  She has had progressive shortness of breath.  She is  unable to do much of any exertion without getting short of breath and  having to stop.  She has never been a smoker.  Chest x-ray performed on  January 09, 2009 by Dr. Renae Gloss which suggested some mild bronchitis  changes.  There was also mild blunting of the costophrenic angle  consistent with a small effusion.  The patient denies any orthopnea or  PND.  She has had mild increased in lower extremity edema.  She has been  on chronic diuretic therapy and Coumadin.  To further evaluate her  progressive dyspnea, we performed a limited  walk test in our office,  which showed normal oxygen saturations at rest of 99%.  Her saturation  remained good at 97% despite significant dyspnea symptoms.  Her heart  rate increased from 90 to about 118.  We increased her diltiazem for  little better rate control and she has noticed some improvement with  this.  Further evaluation included pulmonary function studies, which  were performed on January 26, 2009.  This demonstrated reduction in FVC,  FEV-1, and FEV-1 to FVC ratio, and FEF 25-75% suggesting mild  obstructive airway disease in the peripheral airway.  There was no  significant response to bronchodilators and there was felt to be a lot  of variability in her effort.  She also had a repeat echocardiogram and  this showed mild concentric LVH with normal systolic function.  There  was moderate mitral insufficiency.  There was very mild aortic stenosis.  She had mild biatrial enlargement.  She now has severe tricuspid  insufficiency with estimated severe pulmonary hypertension with a right  ventricular systolic pressure estimated at 75 mmHg.  Her BNP level was  normal at 116.  Her BMET, CBC, TSH were unremarkable.  Given these  findings of severe pulmonary hypertension, it was recommended she  undergo right and left heart catheterization with coronary angiography  and potential vasodilator, evaluated for vasodilator response if  indicated.  She is now admitted for that purpose.   PAST MEDICAL HISTORY:  1. Chronic atrial fibrillation.  2. Chronic mitral insufficiency.  3. Hypertension.  4. Chronic back pain.  5. Hypercholesterolemia.   Prior surgeries include left and right hip replacement.  She has had  previous bladder tuck and sling.  She has had previous hysterectomy for  bleeding fibroids.   She is allergic to PENICILLIN and SULFA.   CURRENT MEDICATIONS:  1. Diltiazem ER 120 mg daily.  2. Furosemide 40 mg per day.  3. Hydrocodone APAP 10/325 mg q.6 h. p.r.n. pain.  4.  Lipitor 20 mg per day.  5. Lisinopril 20 mg daily.  6. Methocarbamol 500 mg q.8 h. p.r.n. muscle spasm.  7. Metoprolol ER 50 mg daily.  8. Paroxetine CR 37.5 mg daily.  9. Premarin 1.25 mg daily.  10.Warfarin, the patient has been on 2.5 mg daily.  11.Valtrex 500 mg b.i.d. p.r.n.  12.Ultram ER 200 mg p.r.n.   She was on a lot of vitamins including vitamin A 8000 international  units daily, B6 200 mg daily, B12 1000 mg daily, C 1000 mg daily,  calcium 1200 mg plus 200 international units vitamin D3 daily, D3 5000  units daily, magnesium and zinc supplement daily, omega-3 fish oil 1000  mg daily, potassium 595 mg daily, zinc 50 mg per day.   SOCIAL HISTORY:  The patient is a retired Environmental health practitioner.  She  is divorced.  She has 3 children.  She denies tobacco use.  She does  drink occasional alcohol.   FAMILY HISTORY:  Father died at age 63 with prostate CA.  Mother died at  age 58 with coronary artery disease and hypertension.  One brother's  history is unknown and he is still living.  One sister died with cancer.   REVIEW OF SYSTEMS:  She has had no hemoptysis, cough, fever, or chills.  No history of any bleeding disorder.  She does note that she bruises  easily on aspirin.  No history of TIA or stroke or other embolic events.  No recent change in bowel or bladder habits.  Her appetite has been  good.  All other systems were reviewed are negative.   PHYSICAL EXAMINATION:  GENERAL:  The patient is a pleasant white female  in no distress.  VITAL SIGNS:  Weight is 175.8, blood pressure 154/78, pulse is 82 and  irregular, respirations are 20 and unlabored.  HEENT:  She is normocephalic, atraumatic.  Pupils equal, round, reactive  to light, and accommodation.  Oropharynx is clear.  NECK:  Supple without JVD, adenopathy, thyromegaly, or bruits.  LUNGS:  Clear.  CARDIAC:  A grade 2/6 systolic murmur heard best at the left sternal  border.  There is no significant gallop.  PMI  is normal.  ABDOMEN:  Soft and nontender.  She has good bowel sounds without masses  or bruits.  EXTREMITIES:  Good pedal pulses and femoral pulses.  She has trace to 1+  edema.  There is no cyanosis.  She has some superficial venous  varicosities.  NEUROLOGIC:  She is alert and oriented x4.  Cranial nerves II through  XII are intact.  Mood is appropriate.  She  has no focal, sensory, motor  deficits.   LABORATORY DATA:  INR was 1.6 on January 30, 2009.  Coumadin subsequently  held for planned procedure.  Prior BUN was 11, creatinine 1.11, TSH of  3.620.  CBC was normal.  ECG shows atrial fibrillation with nonspecific  ST-T wave changes.   IMPRESSION:  1. Severe pulmonary hypertension with progressive dyspnea.  Need to      assess whether this is due to diastolic dysfunction or atrial      fibrillation with mitral insufficiency or is due predominately to      primary lung pathology.  2. Chronic atrial fibrillation.  3. Hypertension.  4. Anticoagulation.  5. Hypercholesterolemia.   PLAN:  We will admit for right and left heart catheterization, coronary  angiography, and possible vasodilator study.           ______________________________  Peter M. Swaziland, M.D.     PMJ/MEDQ  D:  01/30/2009  T:  01/31/2009  Job:  [id number]   cc:   Merlene Laughter. Renae Gloss, M.D.

## 2011-03-25 NOTE — Op Note (Signed)
[facility]  Patient:    [name], [name] Visit Number: [id number] MRN: [id number]          Service Type: DSU Location: [id number] Attending Physician:  Ernesto Rutherford Dictated by:   Ernesto Rutherford, M.D. Proc. Date: 02/04/02 Admit Date:  02/04/2002 Discharge Date: 02/04/2002                             Operative Report  PREOPERATIVE DIAGNOSIS:  Macular hole, left eye, stage 3 profound vision loss.  POSTOPERATIVE DIAGNOSIS:  Macular hole, left eye, stage 3 profound vision loss.  PROCEDURES: 1. Posterior vitrectomy with membrane peel-internal limiting membrane using    ICG guidance. 2. Injection of vitreous substitute-SF6 20% OS.  SURGEON:  Ernesto Rutherford, M.D.  ANESTHESIA:  General endotracheal anesthesia.  INDICATION FOR PROCEDURE:  The patient is a 77 year old woman with profound visual loss with 20/70, 20/80 vision who requires surgical intervention to recover the potential visual acuity in the left eye.  She understands the risk of anesthesia, including the rare occurrence of death; as well as to the eye including hemorrhage, infection, scarring, need for further surgery, no change in vision, loss of vision, and progression of disease despite intervention and the need for another surgery.  She understands the risk of anesthesia as well as surgical.  She also understands that there is a high risk of progression over the next year.  DESCRIPTION OF PROCEDURE:  After appropriate signed consent was obtained, the patient was taken to the operating room.  In the operating room, general endotracheal anesthesia was instituted without difficulty.  The left periocular region was sterilely prepped and draped in the usual ophthalmic fashion.  A lid speculum was applied.  A conjunctival peritomy was fashioned temporally and superonasally.  A 4 mm infusion was secured 4 mm posterior to the lumbus in the inferotemporal quadrant.   Placement in the vitreous cavity was verified visually.  A superior sclerotomy was then fashioned.  The [employer] was positioned with a Biom attachment.  Core vitrectomy was then begun.  Posterior vitreous detachment was then created.  Elevation anterior to the equated 360 degrees was carried out and scleral depression was then used to trim the vitreous base.  Care was taken to avoid the lens.  At this time, fluid air exchange was then completed.  ICD was mixed and secured on the table sterilely.  One cc was mixed with 9 cc of BSS to make a dilute solution of ICG.  A small amount less than 1 cc total was then placed over the posterior pole and immediately aspirated with a soft tip needle.  This procedure was repeated x1.  At this time, an air fluid exchange was then completed. Excellent staining of the eye and internal membrane was obtained.  At this time, a Rice pick was then used to engage the internal membrane.  DRC forceps were then used to complete a circular continuous tear of the internal membrane and its attachments to the edge of the macular hole.  This was completed without difficulty.  At this time, the fluid air exchange was then completed again.  An air and SF6 20% exchange was then completed.  No complications occurred.  The air fluid compression was found to be adequate.  The superior sclerotomy was then closed with a 7-0 Vicryl suture.  The infusion wound was similarly closed with 7-0 Vicryl suture.  The conjunctiva  was closed with a 7-0 Vicryl suture.  The subconjunctival injections of steroid were applied and Cipro had been given for prophylaxis intravenously.  The patient was awakened from anesthesia and returned to the recovery room in good, stable condition.  The patient tolerated the procedure well without complication. Dictated by:   Ernesto Rutherford, M.D. Attending Physician:  Ernesto Rutherford DD:  02/05/99 TD:  02/04/02 Job: [id number]

## 2011-03-25 NOTE — Op Note (Signed)
[name], [name]           ACCOUNT NO.:  [id number]   MEDICAL RECORD NO.:  [id number]          PATIENT TYPE:  AMB   LOCATION:  DAY                          FACILITY:  [facility]   PHYSICIAN:  Marlowe Kays, M.D.  DATE OF BIRTH:  1932-03-14   DATE OF PROCEDURE:  05/05/2005  DATE OF DISCHARGE:                                 OPERATIVE REPORT   PREOPERATIVE DIAGNOSES:  Symptomatic right second claw toe and hallux valgus  deformities.   POSTOPERATIVE DIAGNOSES:  Symptomatic right second claw toe and hallux  valgus deformities.   OPERATION:  1.  Fusion PIP joint with Z lengthening of extensor tendon and dorsal      capsulotomy MP joint second toe.  2.  Varus medial closing wedge osteotomy proximal phalanx great toe right      foot.   PATHOLOGY AND JUSTIFICATION FOR PROCEDURE:  She had bilateral total hips,  had been getting some chronic breakdown of the skin on the dorsum of the PIP  joint of her second toe. She had a very minimal bunion deformity which she  did not wish to have corrected. Her major problem elevating up the second  toe was valgus of the great toe due to deformity of the proximal phalanx.   DESCRIPTION OF PROCEDURE:  Satisfactory general anesthesia, prophylactic  antibiotics, pneumatic tourniquet, leg was esmarched out nonsterilely,  prepped with DuraPrep from mid calf to toes, draped in a sterile field. I  used the C-arm to monitor my osteotomy. I made a dorsal medial incision over  the proximal phalanx of the great toe and with subperiosteal dissection  exposed the base of the proximal phalanx. At the metaphyseal flare, I placed  a 0.045 guidepin until it was horizontal to the joint and just above the  metaphyseal flare on the C-arm. I then placed an oblique 0.035 K wire so  that it formed a pie shaped wedge and checked this position with a C-arm as  well. I then used a microsaw to remove most of the pie shaped wedge cracking  the osteotomy closed manually  with a nice correction of her valgus and good  apposition of the two bone surfaces. I then placed two smooth K wires  stabilizing the osteotomy with a 0.045 K wire obliquely and 0.062 through  the tip of the toe with position checked on AP and lateral C-arm. Pins were  in the proximal phalanx and in the proximal base of the phalanx without  injuring the MP joint. The wound was irrigated with sterile saline, the  subcutaneous tissue closed with 3-0 Vicryl and the skin with interrupted 4-0  nylon mattress sutures. The pins were bent, cut and capped. On the second  toe, I made a dorsal incision over the split in the extensor tendon over the  PIP joint. The PIP joint was exposed. I then, with large and small bone  cutters, removed the articular cartilage in squared off fashion of the  distal portion of the proximal phalanx and the proximal portion of the  middle phalanx. Even after removing adequate bone, she still had some  tightness and in trying  to flex the toe back to a neutral position, the  extensor tendon appeared to be tight and consequently I opened the incision  proximally splitting the extensor tendon and cutting it in Z fashion. I also  under direct visualization opened the dorsum of the MP joint with some small  scissors. These maneuvers allowed me to bring the toe into a correct  position. I irrigated this wound well with sterile saline as well and placed  a 0.45 smooth K wire antegrade and then retrograde stabilizing the PIP joint  fusion site in good position with a pin going up through the MP joint into  the second metatarsal. I then repaired the Z lengthened extensor tendon with  4-0 Vicryl, subcutaneous tissue with the same and skin with interrupted 4-0  nylon mattress sutures. This pin was likewise bent,  cut and capped. Both toes were blocked with 0.5% plain Marcaine. Betadine  adaptic dry sterile dressing were applied, tourniquet was released. She was  taken to the  recovery room in satisfactory condition with no known  complications.       JA/MEDQ  D:  05/05/2005  T:  05/05/2005  Job:  [id number]

## 2011-03-25 NOTE — Op Note (Signed)
[facility]  Patient:    [name], [name] Visit Number: [id number] MRN: [id number]          Service Type: GYN Location: 1S X005 01 Attending Physician:  Oliver Pila Dictated by:   Alvino Chapel, M.D. Proc. Date: 05/03/02 Admit Date:  05/03/2002                             Operative Report  PREOPERATIVE DIAGNOSES: 1. Cystocele. 2. Stress urinary incontinence.  POSTOPERATIVE DIAGNOSES: 1. Cystocele. 2. Stress urinary incontinence.  OPERATION: 1. Anterior repair. 2. Renaye Rakers sling by Dr. Vernie Ammons.  SURGEON:  Alvino Chapel, M.D./Mark C. Vernie Ammons, M.D.  ASSISTANT:  Zenaida Niece, M.D.  ANESTHESIA:   General  ESTIMATED BLOOD LOSS:  350  IV FLUIDS:  1700 cc  FINDINGS:  There is a moderate cystocele.  No rectocele was present.  DESCRIPTION OF PROCEDURE:  The patient was taken to the operating room where she was placed in the dorsal lithotomy position, awake secondary to a history of bilateral hip replacement and was in no discomfort.  She was then placed under general anesthesia without difficulty.  The patient was then prepped in a normal sterile fashion and the bladder emptied with a Robinson catheter. Attention was then turned to the vagina where a weighted speculum was placed within the vagina exposing the anterior vaginal mucosa.  The vaginal mucosa was grasped at the level of the vaginal cuff with two Allis clamps and area of vaginal mucosa removed with the Mayo scissors.  This was then underscored with the Metzenbaum scissors and a midline incision begin.  At each step Allis clamps were placed on the retracting vaginal mucosa and this incision was extended along the midline up to about 1 cm below the urethra.  With the vaginal mucosa thus opened in the midline, the underlying pubovesical fascia was dissected away with the Metzenbaum scissors and retracted medially.  This was performed bilaterally with  mucosal flaps satisfactorily isolated from the underlying mucosa.  Two sutures of 0 Vicryl were then placed suburethrally and reapproximated the pubovesical fascia in this portion of the cystocele.  At this point, Dr. Ihor Gully scrubbed in and performed his Sparks sling procedure.  For details of that please see his separately dictated operative report.  At the conclusion of the sling procedure, the remainder of the cystocele was reduced with interrupted sutures of 0 Vicryl placed to reapproximate the pubovesical fascia in the midline.  When the cystocele was reduced the overlying mucosal flaps were trimmed away with Mayo scissors and the vaginal mucosa itself then closed with 2-0 Vicryl in a running lock suture. One additional small area of bleeding was sutured with a 3-0 Vicryl in an interrupted suture.  The bladder was then emptied and there was some question of blood tinged urine; however, the Foley catheter had quite a bit of blood on it from the procedure itself.  Therefore this Foley was removed and a clean catheter placed within the bladder.  The vaginal was also packed with estrogen cream gauze.  With the clean Foley in place, the urine was immediately clear.  At the conclusion of the procedure, sponge, lap and needle counts were correct times two and the patient was taken to the recovery room awake and in stable condition. Dictated by:   Alvino Chapel, M.D. Attending Physician:  Oliver Pila DD:  05/03/02 TD:  05/06/02 Job: [id number]

## 2011-03-25 NOTE — Op Note (Signed)
NAME:  [name], [name]                     ACCOUNT NO.:  [id number]   MEDICAL RECORD NO.:  [id number]                   PATIENT TYPE:  AMB   LOCATION:  ENDO                                 FACILITY:  [facility]   PHYSICIAN:  Anselmo Rod, M.D.               DATE OF BIRTH:  1932-11-04   DATE OF PROCEDURE:  05/31/2004  DATE OF DISCHARGE:                                 OPERATIVE REPORT   PROCEDURE PERFORMED:  Screening colonoscopy.   ENDOSCOPIST:  Anselmo Rod, M.D.   INSTRUMENT USED:  Olympus video colonoscope.   INDICATIONS FOR PROCEDURE:  A 77 year old white female with history of  occasional rectal bleeding underwent screening colonoscopy to rule out  chronic polyps, masses, etc.   PREPROCEDURE PREPARATION:  Informed consent was procured from the patient.  The patient was fasted for eight hours prior to the procedure and prepped  with a bottle of magnesium citrate and a gallon of GoLYTELY the night prior  to the procedure.   PREPROCEDURE PHYSICAL:  VITAL SIGNS:  Stable.  NECK:  Supple.  CHEST:  Clear to auscultation.  CARDIOVASCULAR:  S1 and S2 regular.  ABDOMEN:  Soft with normal bowel sounds.   DESCRIPTION OF PROCEDURE:  The patient was placed in the left lateral  decubitus position, sedated with 50 mg of Demerol and 5 mg of Versed in slow  incremental doses.  Once the patient was adequately sedated and maintained  on low flow oxygen and continuous cardiac monitoring, the Olympus video  colonoscope was advanced into the rectum and the cecum, small internal  hemorrhoids were seen on retroflexion.  The rest of the examination was  unrevealing.  There was some residual stool in the colon.  Multiple washings  were done.  Visualization was adequate.  No masses, polyps or diverticula  were seen.  The patient tolerated the procedure well without immediate  complications.   IMPRESSION:  Normal colonoscopy of the cecum except for small internal  hemorrhoids.   RECOMMENDATIONS:  1. Continue high fiber diet with liberal fluid intake.  2. Repeat colonoscopy in the next 10 years unless the patient develops any     abnormal symptoms in the interim.  3. Outpatient follow-up as need arises in the future.                                               Anselmo Rod, M.D.    JNM/MEDQ  D:  05/31/2004  T:  05/31/2004  Job:  [id number]   cc:   Merlene Laughter. Renae Gloss, M.D.  [address]  [address]  [place]  [place] [address]  Fax: [phone]   Huel Cote, M.D.  [address] [place], [address]  [place], [place] [address]  Fax: [phone]

## 2011-03-25 NOTE — Op Note (Signed)
[facility]  Patient:    LEXIS, POTENZA Visit Number: [id number] MRN: [id number]          Service Type: GYN Location: 1S X005 01 Attending Physician:  Oliver Pila Dictated by:   Veverly Fells Vernie Ammons, M.D. Proc. Date: 05/03/02 Admit Date:  05/03/2002   CC:         Alvino Chapel, M.D.   Operative Report  PREOPERATIVE DIAGNOSIS:  Stress urinary incontinence.  POSTOPERATIVE DIAGNOSIS:  Stress urinary incontinence.  PROCEDURE:  SPARC pubovaginal sling.  SURGEON:  Mark C. Vernie Ammons, M.D.  ANESTHESIA:  General.  DRAINS:  None.  ESTIMATED BLOOD LOSS:  Approximately 50 cc.  SPECIMENS:  None.  COMPLICATIONS:  None.  INDICATIONS FOR PROCEDURE:  The patient is a 77 year old white female with stress urinary incontinence occurring with coughing, sneezing, and no significant irritative symptoms. She was very active and is bothered significantly by the leakage. She also had a significant cystocele. She was found on exam to have in addition to her cystocele loss of urethrovesical junction support and a moderate deflection by Q-tip exam. The patient understands the risks, complications and alternatives to surgical correction which she has elected to proceed with.  DESCRIPTION OF PROCEDURE:  After informed consent, the patient was brought to the major OR, placed on the table, administered general anesthesia and then moved to the dorsal lithotomy position. Her lower abdomen, genitalia and vagina were then sterilely prepped and draped and Dr. Senaida Ores initially incised the vaginal mucosa in the midline in preparation for the cystocele repair. After this was performed, I made two stab incisions in the suprapubic region about 3 fingerbreadths apart and placed the cystoscope sheath in the bladder and draining the bladder after palpating the mid urethra with the Foley catheter in. I then passed the trocar through the suprapubic incision and  out through the mid urethra behind the symphysis pubis on the left and then right sides.  The 7 degree lens was then inserted in the cystoscope sheath and the bladder was inspected and noted to have no perforations or bleeding and also was noted to be free of tumor, stones or inflammatory lesions.  I fixed the sling material to the trocars on each side and passed those up back behind the symphysis pubis and out through the suprapubic incisions. I positioned the sling material beneath the mid urethra, placed the Foley catheter in the bladder and then tightened the sling over a cystoscope sheath. Moderate tension was used but it still was tension free.  I then closed the suprapubic stab incisions with dermabond.  Dr. Senaida Ores then completed the anterior repair and closed the vaginal mucosa which has been dictated by her separately. The patient desires to go home today so she will be given a prescription for antibiotics and will follow-up in my office in 7-10 days. Dictated by:   Veverly Fells Vernie Ammons, M.D. Attending Physician:  Oliver Pila DD:  05/03/02 TD:  05/06/02 Job: [id number]

## 2011-03-25 NOTE — H&P (Signed)
[facility]  Patient:    [name], [name] Visit Number: [id number] MRN: [id number]          Service Type: Attending:  Alvino Chapel, M.D. Dictated by:   Alvino Chapel, M.D. Adm. Date:  05/03/02                           History and Physical  HISTORY OF PRESENT ILLNESS:  The patient is a 77 year old G3, P3, who is admitted for a scheduled anterior repair and placement of Sparks ring for a cystocele and symptomatic stress urinary incontinence.  The patient reports an ongoing problem with stress urinary incontinence where she leaks a tablespoon with a cough, sneeze, or exercise.  The patient has also been evaluated by [name], M.D., who agrees with this diagnosis and feels that she will be best suited with a Sparks ring to correct her urethral hypermobility.  PAST MEDICAL HISTORY: 1. Chronic hypertension. 2. Hypercholesterolemia. 3. Bilateral hip replacements.  PAST SURGICAL HISTORY:  The patient had a vaginal hysterectomy in 1986.  She had her left hip replacement in 1998, right hip replacement in 1998, and also had left macular eye surgery in 2003.  PAST OBSTETRICAL HISTORY:  Three normal spontaneous vaginal deliveries.  PAST GYNECOLOGIC HISTORY:  No abnormal Pap smears.  ALLERGIES:  PENICILLIN.  MEDICATIONS:  Premarin 1.25 mg recently decreased to 0.9 mg, Lovastatin, Lisinopril, Celebrex, Paxil, Lotemax, and several p.r.n. medications.  FAMILY HISTORY:  Insignificant, with no breast or colon cancer.  PHYSICAL EXAMINATION:  VITAL SIGNS:  The patients weight is 177 pounds, blood pressure 160/80.  CHEST:  Lungs are clear.  CARDIAC:  Regular rate and rhythm.  ABDOMEN:  Soft and nontender.  BREASTS:  No masses, discharge, or adenopathy noted.  PELVIC:  She has normal external genitalia with a moderate cystocele.  The cuff is well-supported, and there is no rectocele present.  Simple urodynamics revealed a  hyperdynamic UVJ with Q-Tip deflection of approximately 90 degrees.  PLAN:  Again, the patient is evaluated by Dr. Vernie Ammons, who agrees with a diagnosis of stress urinary incontinence, and has been counseled to the risks and benefits of proceeding with an anterior repair and placement of a Sparks sling.  The patient was counseled as to the possible risks of bleeding and bladder damage, including possible need for a larger incision as well as repair of the bladder should there be any incidental cystotomy.  The patient understands these risks and desires to proceed with surgery. Dictated by:   Alvino Chapel, M.D. Attending:  Alvino Chapel, M.D. DD:  04/30/02 TD:  05/01/02 Job: [id number]

## 2011-03-29 ENCOUNTER — Ambulatory Visit (INDEPENDENT_AMBULATORY_CARE_PROVIDER_SITE_OTHER): Payer: Medicare Other | Admitting: *Deleted

## 2011-03-29 DIAGNOSIS — I4891 Unspecified atrial fibrillation: Secondary | ICD-10-CM

## 2011-03-29 LAB — POCT INR: INR: 3

## 2011-04-12 ENCOUNTER — Ambulatory Visit (INDEPENDENT_AMBULATORY_CARE_PROVIDER_SITE_OTHER): Payer: Medicare Other | Admitting: *Deleted

## 2011-04-12 DIAGNOSIS — I4891 Unspecified atrial fibrillation: Secondary | ICD-10-CM

## 2011-04-12 LAB — POCT INR: INR: 3.3

## 2011-04-26 ENCOUNTER — Encounter: Payer: Medicare Other | Admitting: *Deleted

## 2011-04-27 ENCOUNTER — Ambulatory Visit (INDEPENDENT_AMBULATORY_CARE_PROVIDER_SITE_OTHER): Payer: Medicare Other | Admitting: *Deleted

## 2011-04-27 DIAGNOSIS — I4891 Unspecified atrial fibrillation: Secondary | ICD-10-CM

## 2011-05-12 ENCOUNTER — Ambulatory Visit (INDEPENDENT_AMBULATORY_CARE_PROVIDER_SITE_OTHER): Payer: Medicare Other | Admitting: *Deleted

## 2011-05-12 DIAGNOSIS — I4891 Unspecified atrial fibrillation: Secondary | ICD-10-CM

## 2011-05-12 LAB — POCT INR: INR: 2

## 2011-05-14 ENCOUNTER — Other Ambulatory Visit: Payer: Self-pay | Admitting: Cardiology

## 2011-05-16 NOTE — Telephone Encounter (Signed)
escribe medication per fax request  

## 2011-06-08 ENCOUNTER — Ambulatory Visit (INDEPENDENT_AMBULATORY_CARE_PROVIDER_SITE_OTHER): Payer: Medicare Other | Admitting: *Deleted

## 2011-06-08 DIAGNOSIS — I4891 Unspecified atrial fibrillation: Secondary | ICD-10-CM

## 2011-06-27 ENCOUNTER — Other Ambulatory Visit: Payer: Self-pay | Admitting: Cardiology

## 2011-06-27 DIAGNOSIS — I340 Nonrheumatic mitral (valve) insufficiency: Secondary | ICD-10-CM

## 2011-06-27 DIAGNOSIS — I119 Hypertensive heart disease without heart failure: Secondary | ICD-10-CM

## 2011-06-29 ENCOUNTER — Encounter: Payer: Self-pay | Admitting: Cardiology

## 2011-06-29 ENCOUNTER — Other Ambulatory Visit (INDEPENDENT_AMBULATORY_CARE_PROVIDER_SITE_OTHER): Payer: Medicare Other | Admitting: *Deleted

## 2011-06-29 ENCOUNTER — Ambulatory Visit (INDEPENDENT_AMBULATORY_CARE_PROVIDER_SITE_OTHER): Payer: Medicare Other | Admitting: *Deleted

## 2011-06-29 ENCOUNTER — Ambulatory Visit (INDEPENDENT_AMBULATORY_CARE_PROVIDER_SITE_OTHER): Payer: Medicare Other | Admitting: Cardiology

## 2011-06-29 DIAGNOSIS — Z7901 Long term (current) use of anticoagulants: Secondary | ICD-10-CM

## 2011-06-29 DIAGNOSIS — I482 Chronic atrial fibrillation, unspecified: Secondary | ICD-10-CM

## 2011-06-29 DIAGNOSIS — I2789 Other specified pulmonary heart diseases: Secondary | ICD-10-CM

## 2011-06-29 DIAGNOSIS — I4891 Unspecified atrial fibrillation: Secondary | ICD-10-CM

## 2011-06-29 DIAGNOSIS — I340 Nonrheumatic mitral (valve) insufficiency: Secondary | ICD-10-CM

## 2011-06-29 DIAGNOSIS — I272 Pulmonary hypertension, unspecified: Secondary | ICD-10-CM

## 2011-06-29 DIAGNOSIS — I119 Hypertensive heart disease without heart failure: Secondary | ICD-10-CM

## 2011-06-29 DIAGNOSIS — I059 Rheumatic mitral valve disease, unspecified: Secondary | ICD-10-CM

## 2011-06-29 LAB — BASIC METABOLIC PANEL
BUN: 15 mg/dL (ref 6–23)
CO2: 30 mEq/L (ref 19–32)
Calcium: 10 mg/dL (ref 8.4–10.5)
GFR: 66.16 mL/min (ref >60.00)
Glucose, Bld: 99 mg/dL (ref 70–99)
Sodium: 148 mEq/L — ABNORMAL HIGH (ref 135–145)

## 2011-06-29 LAB — POCT INR: INR: 2.8

## 2011-06-29 NOTE — Assessment & Plan Note (Signed)
INR is therapeutic. We will recheck in 4 weeks.

## 2011-06-29 NOTE — Progress Notes (Signed)
[name] Date of Birth: 1932-07-21   History of Present Illness: [name] is seen today for followup. She states she's doing very well. She remains very active with her exercise program. Her breathing has improved since the heat and humidity have decreased. She has more trouble breathing when the humidity is high. She's had no recent bronchitic symptoms. She denies any chest pain.  Current Outpatient Prescriptions on File Prior to Visit  Medication Sig Dispense Refill  . Ascorbic Acid (VITAMIN C) 1000 MG tablet Take 1,000 mg by mouth daily.        Marland Kitchen atorvastatin (LIPITOR) 20 MG tablet Take 1 tablet by mouth Daily.      . Biotin 1000 MCG tablet Take 1,000 mcg by mouth daily.        . Calcium Carbonate-Vitamin D (CALCIUM + D PO) Take by mouth daily.        . Cholecalciferol (VITAMIN D PO) Take by mouth daily.        Marland Kitchen diltiazem (CARDIZEM) 120 MG tablet Take 1 tablet (120 mg total) by mouth daily.  30 tablet  5  . estrogens, conjugated, (PREMARIN) 1.25 MG tablet Take 1.25 mg by mouth daily.        . furosemide (LASIX) 40 MG tablet TAKE 2 TABLETS IN THE MORNING AND 1 TABLET IN THE EVENING  90 tablet  5  . gabapentin (NEURONTIN) 100 MG capsule Take 1 tablet by mouth Daily.      Marland Kitchen HYDROcodone-acetaminophen (NORCO) 10-325 MG per tablet Take 1 tablet by mouth every 6 (six) hours as needed.        Marland Kitchen lisinopril (PRINIVIL,ZESTRIL) 20 MG tablet Take 1 tablet by mouth Daily.      Marland Kitchen lisinopril-hydrochlorothiazide (PRINZIDE,ZESTORETIC) 20-25 MG per tablet Take 1 tablet by mouth daily.        . Magnesium 400 MG CAPS Take by mouth daily.        . Multiple Vitamins-Minerals (ZINC PO) Take by mouth daily.        Marland Kitchen PARoxetine (PAXIL-CR) 37.5 MG 24 hr tablet Take 37.5 mg by mouth every morning.        . Potassium (POTASSIMIN PO) Take 595 mg by mouth daily. 3 TABLETS DAILY      . pyridOXINE (VITAMIN B-6) 100 MG tablet Take 100 mg by mouth daily.        . vitamin A 8000 UNIT capsule Take 8,000  Units by mouth daily.        Marland Kitchen warfarin (COUMADIN) 5 MG tablet TAKE 1/2 TABLET BY MOUTH DAILY FOR 6 DAYS, THEN 1 TABLET (5MG ) FOR 1 DAY, OR AS DIRECTED  30 tablet  5  . DISCONTD: diltiazem (CARDIZEM CD) 120 MG 24 hr capsule Take 1 tablet by mouth Daily.        Allergies  Allergen Reactions  . Codeine   . Penicillins   . Sulfonamide Derivatives     Past Medical History  Diagnosis Date  . Mitral insufficiency     CHRONIC  . Hypertensive heart disease     WITH LVH  . LVH (left ventricular hypertrophy) due to hypertensive disease   . Chronic atrial fibrillation   . Pulmonary hypertension   . Hypercholesterolemia   . Back pain, chronic   . Depression     Past Surgical History  Procedure Date  . Total hip arthroplasty     BILATERAL  . Bladder surgery   . Abdominal hysterectomy     History  Smoking  status  . Never Smoker   Smokeless tobacco  . Never Used    History  Alcohol Use No    Family History  Problem Relation Age of Onset  . Heart disease Mother 51  . Prostate cancer Father 47    Review of Systems:   All other systems were reviewed and are negative.  Physical Exam: BP 138/78  Pulse 80  Ht 5\' 5"  (1.651 m)  Wt 155 lb 3.2 oz (70.398 kg)  BMI 25.83 kg/m2 She is a pleasant white female in no acute distress. Her HEENT exam is unremarkable. Sclera are clear. Oropharynx is clear. Neck is supple without JVD, adenopathy, thyromegaly, or bruits. Lungs are clear. Cardiac exam reveals a regular rate and rhythm without gallop. There is a grade 2/6 systolic murmur heard best at the left sternal border. Abdomen is soft and nontender. She has no significant edema. Pedal pulses are palpable. She is alert and oriented x3. Cranial nerves II through XII are intact.  LABORATORY DATA: Recent INR was 2.8. ECG today demonstrates atrial fibrillation with low voltage and nonspecific ST abnormality.  Assessment / Plan:

## 2011-06-29 NOTE — Assessment & Plan Note (Signed)
Rate is well controlled and she is asymptomatic. She is on chronic anticoagulation and is therapeutic.

## 2011-06-29 NOTE — Patient Instructions (Signed)
Continue your current medications.  We will check your coumadin again in 4 weeks.  I will see you again in 3 months.

## 2011-06-29 NOTE — Assessment & Plan Note (Signed)
Symptoms of dyspnea have improved with exercise program. She will remain on her current therapy.

## 2011-06-29 NOTE — Assessment & Plan Note (Signed)
Her exam is stable. We'll continue with her current therapy and monitor.

## 2011-06-30 ENCOUNTER — Encounter: Payer: Self-pay | Admitting: *Deleted

## 2011-06-30 ENCOUNTER — Telehealth: Payer: Self-pay | Admitting: Cardiology

## 2011-06-30 NOTE — Telephone Encounter (Signed)
Patient requested to speak with you.  She is requesting a copy of her labwork done 06/29/2011.  She would like it mailed.  Confirmed address and phone number.

## 2011-06-30 NOTE — Telephone Encounter (Signed)
Message copied by Lorayne Bender on Thu Jun 30, 2011 11:43 AM ------      Message from: [place], PETER M      Created: Wed Jun 29, 2011  5:33 PM       bmet looks ok.

## 2011-06-30 NOTE — Telephone Encounter (Signed)
Notified of lab results. Will send to copy to Dr. Renae Gloss and to her.

## 2011-07-07 ENCOUNTER — Telehealth: Payer: Self-pay | Admitting: Cardiology

## 2011-07-07 NOTE — Telephone Encounter (Signed)
Pt wants to discuss bw results please call

## 2011-07-07 NOTE — Telephone Encounter (Signed)
Returned her call regarding lab results. She wants to speak with Synetta Fail regarding her high sodium and GFR. Informed her Synetta Fail is out of the office til later this afternoon. She verbalizes understanding that it may be tomorrow before she hears from [place].

## 2011-07-27 ENCOUNTER — Encounter: Payer: Medicare Other | Admitting: *Deleted

## 2011-07-28 ENCOUNTER — Ambulatory Visit (INDEPENDENT_AMBULATORY_CARE_PROVIDER_SITE_OTHER): Payer: Medicare Other | Admitting: *Deleted

## 2011-07-28 DIAGNOSIS — I4891 Unspecified atrial fibrillation: Secondary | ICD-10-CM

## 2011-08-09 LAB — BASIC METABOLIC PANEL
BUN: 6
CO2: 29
Calcium: 10.1
GFR calc Af Amer: >60
GFR calc non Af Amer: >60
Glucose, Bld: 122 — ABNORMAL HIGH
Potassium: 3.8
Sodium: 137

## 2011-08-11 LAB — BASIC METABOLIC PANEL
BUN: 11 mg/dL (ref 6–23)
Calcium: 9.1 mg/dL (ref 8.4–10.5)
Chloride: 106 mEq/L (ref 96–112)
Creatinine, Ser: 0.91 mg/dL (ref 0.4–1.2)
GFR calc Af Amer: >60 mL/min (ref >60)
Glucose, Bld: 111 mg/dL — ABNORMAL HIGH (ref 70–99)
Potassium: 4.1 mEq/L (ref 3.5–5.1)
Sodium: 141 mEq/L (ref 135–145)

## 2011-08-11 LAB — HEMOGLOBIN AND HEMATOCRIT, BLOOD
HCT: 36.3 % (ref 36.0–46.0)
Hemoglobin: 12 g/dL (ref 12.0–15.0)

## 2011-08-22 ENCOUNTER — Other Ambulatory Visit: Payer: Self-pay | Admitting: Cardiology

## 2011-08-24 ENCOUNTER — Ambulatory Visit (INDEPENDENT_AMBULATORY_CARE_PROVIDER_SITE_OTHER): Payer: Medicare Other | Admitting: *Deleted

## 2011-08-24 DIAGNOSIS — Z7901 Long term (current) use of anticoagulants: Secondary | ICD-10-CM

## 2011-08-24 DIAGNOSIS — I482 Chronic atrial fibrillation, unspecified: Secondary | ICD-10-CM

## 2011-08-24 DIAGNOSIS — I4891 Unspecified atrial fibrillation: Secondary | ICD-10-CM

## 2011-09-21 ENCOUNTER — Encounter: Payer: Medicare Other | Admitting: *Deleted

## 2011-09-26 ENCOUNTER — Ambulatory Visit: Payer: Medicare Other | Admitting: Cardiology

## 2011-09-28 ENCOUNTER — Ambulatory Visit (INDEPENDENT_AMBULATORY_CARE_PROVIDER_SITE_OTHER): Payer: Medicare Other | Admitting: *Deleted

## 2011-09-28 DIAGNOSIS — Z7901 Long term (current) use of anticoagulants: Secondary | ICD-10-CM

## 2011-09-28 DIAGNOSIS — I4891 Unspecified atrial fibrillation: Secondary | ICD-10-CM

## 2011-09-28 DIAGNOSIS — I482 Chronic atrial fibrillation, unspecified: Secondary | ICD-10-CM

## 2011-09-28 LAB — POCT INR: INR: 1.7

## 2011-10-07 ENCOUNTER — Ambulatory Visit (INDEPENDENT_AMBULATORY_CARE_PROVIDER_SITE_OTHER): Payer: Medicare Other | Admitting: Cardiology

## 2011-10-07 ENCOUNTER — Encounter: Payer: Self-pay | Admitting: Cardiology

## 2011-10-07 VITALS — BP 151/95 | HR 64 | Ht 65.0 in | Wt 149.0 lb

## 2011-10-07 DIAGNOSIS — I4891 Unspecified atrial fibrillation: Secondary | ICD-10-CM

## 2011-10-07 DIAGNOSIS — I340 Nonrheumatic mitral (valve) insufficiency: Secondary | ICD-10-CM

## 2011-10-07 DIAGNOSIS — I059 Rheumatic mitral valve disease, unspecified: Secondary | ICD-10-CM

## 2011-10-07 DIAGNOSIS — I10 Essential (primary) hypertension: Secondary | ICD-10-CM

## 2011-10-07 DIAGNOSIS — I2789 Other specified pulmonary heart diseases: Secondary | ICD-10-CM

## 2011-10-07 DIAGNOSIS — I482 Chronic atrial fibrillation, unspecified: Secondary | ICD-10-CM

## 2011-10-07 DIAGNOSIS — I272 Pulmonary hypertension, unspecified: Secondary | ICD-10-CM

## 2011-10-07 NOTE — Assessment & Plan Note (Addendum)
She remains asymptomatic. She has no cyanosis. Continue with her exercise program.

## 2011-10-07 NOTE — Patient Instructions (Signed)
Monitor your blood pressure at home. If it is staying high let me know and we will adjust your medication.  Check with your pharmacist about Xarelto. If is affordable we can make the switch.  I will see you again in 4 months.

## 2011-10-07 NOTE — Progress Notes (Signed)
[name] Date of Birth: [birth year]   History of Present Illness: [name] is seen today for followup. She states she's doing very well. She remains very active with her exercise program. Her breathing has improved since. He denies any cough or chest pain. She has had no lightheadedness or dizziness. She is currently living in an apartment in [place] to be closer to her daughter.  Current Outpatient Prescriptions on File Prior to Visit  Medication Sig Dispense Refill  . Ascorbic Acid (VITAMIN C) 1000 MG tablet Take 1,000 mg by mouth daily.        Marland Kitchen atorvastatin (LIPITOR) 20 MG tablet Take 1 tablet by mouth Daily.      . Biotin 1000 MCG tablet Take 1,000 mcg by mouth daily.        . Calcium Carbonate-Vitamin D (CALCIUM + D PO) Take by mouth daily.        . Cholecalciferol (VITAMIN D PO) Take by mouth daily.        Marland Kitchen diltiazem (CARDIZEM CD) 120 MG 24 hr capsule TAKE ONE CAPSULE EVERY DAY  30 capsule  5  . estrogens, conjugated, (PREMARIN) 1.25 MG tablet Take 1.25 mg by mouth daily.        . furosemide (LASIX) 40 MG tablet TAKE 2 TABLETS IN THE MORNING AND 1 TABLET IN THE EVENING  90 tablet  5  . gabapentin (NEURONTIN) 100 MG capsule Take 1 tablet by mouth Daily.      Marland Kitchen HYDROcodone-acetaminophen (NORCO) 10-325 MG per tablet Take 1 tablet by mouth every 6 (six) hours as needed.        Marland Kitchen lisinopril (PRINIVIL,ZESTRIL) 20 MG tablet Take 1 tablet by mouth Daily.      . Magnesium 400 MG CAPS Take by mouth daily.        . Multiple Vitamins-Minerals (ZINC PO) Take by mouth daily.        Marland Kitchen PARoxetine (PAXIL-CR) 37.5 MG 24 hr tablet Take 37.5 mg by mouth every morning.        . Potassium (POTASSIMIN PO) Take 595 mg by mouth daily. 3 TABLETS DAILY      . pyridOXINE (VITAMIN B-6) 100 MG tablet Take 100 mg by mouth daily.        . vitamin A 8000 UNIT capsule Take 8,000 Units by mouth daily.        Marland Kitchen warfarin (COUMADIN) 5 MG tablet TAKE 1/2 TABLET BY MOUTH DAILY FOR 6 DAYS, THEN 1 TABLET  (5MG ) FOR 1 DAY, OR AS DIRECTED  30 tablet  5  . DISCONTD: diltiazem (CARDIZEM) 120 MG tablet Take 1 tablet (120 mg total) by mouth daily.  30 tablet  5    Allergies  Allergen Reactions  . Codeine   . Penicillins   . Sulfonamide Derivatives     Past Medical History  Diagnosis Date  . Mitral insufficiency     CHRONIC  . Hypertensive heart disease     WITH LVH  . LVH (left ventricular hypertrophy) due to hypertensive disease   . Chronic atrial fibrillation   . Pulmonary hypertension   . Hypercholesterolemia   . Back pain, chronic   . Depression     Past Surgical History  Procedure Date  . Total hip arthroplasty     BILATERAL  . Bladder surgery   . Abdominal hysterectomy     History  Smoking status  . Never Smoker   Smokeless tobacco  . Never Used    History  Alcohol Use No    Family History  Problem Relation Age of Onset  . Heart disease Mother 20  . Prostate cancer Father 64    Review of Systems:  As noted in history of present illness. All other systems were reviewed and are negative.  Physical Exam: BP 151/95  Pulse 64  Ht 5\' 5"  (1.651 m)  Wt 149 lb (67.586 kg)  BMI 24.79 kg/m2 She is a pleasant white female in no acute distress. Her HEENT exam is unremarkable. Sclera are clear. Oropharynx is clear. Neck is supple without JVD, adenopathy, thyromegaly, or bruits. Lungs are clear. Cardiac exam reveals a regular rate and rhythm without gallop. There is a grade 2/6 systolic murmur heard best at the left sternal border. Abdomen is soft and nontender. She has no significant edema. Pedal pulses are palpable. She is alert and oriented x3. Cranial nerves II through XII are intact.  LABORATORY DATA:   Assessment / Plan:

## 2011-10-07 NOTE — Assessment & Plan Note (Signed)
Her rate is well controlled and she is on chronic anticoagulation. We will continue with her current therapy.

## 2011-10-19 ENCOUNTER — Encounter: Payer: Medicare Other | Admitting: *Deleted

## 2011-10-24 ENCOUNTER — Telehealth: Payer: Self-pay | Admitting: *Deleted

## 2011-10-24 ENCOUNTER — Telehealth: Payer: Self-pay | Admitting: Cardiology

## 2011-10-24 MED ORDER — RIVAROXABAN 20 MG PO TABS: 20.0000 mg | ORAL_TABLET | Freq: Once | ORAL | Status: DC

## 2011-10-24 NOTE — Telephone Encounter (Signed)
Pt ready to try the xarelto 30 day supply to [facility] [address], Pls call if ok [phone]

## 2011-10-24 NOTE — Telephone Encounter (Signed)
Received fax from [facility] for Prior Authorization for Xarelto. Called [phone] spoke w/Linda who approved. Notified [facility]. Also notified Ms. Ding that prior Berkley Harvey has been done and the cost to her would be $37. States she is going to stay on coumadin. Will advised coumadin clinic.

## 2011-10-24 NOTE — Telephone Encounter (Signed)
Called stating she wanted to start Xarelto. Will send Rx to [facility] [address]. Advised to stop Coumadin one day and start Xarelto 20 mg next day. Take w/largest meal usually supper.

## 2011-10-25 ENCOUNTER — Telehealth: Payer: Self-pay | Admitting: *Deleted

## 2011-10-25 NOTE — Telephone Encounter (Signed)
[name] changed her mind and does not want to be put on Xarelto but will stay on Coumadin.

## 2011-10-26 ENCOUNTER — Ambulatory Visit (INDEPENDENT_AMBULATORY_CARE_PROVIDER_SITE_OTHER): Payer: Medicare Other | Admitting: *Deleted

## 2011-10-26 DIAGNOSIS — I482 Chronic atrial fibrillation, unspecified: Secondary | ICD-10-CM

## 2011-10-26 DIAGNOSIS — Z7901 Long term (current) use of anticoagulants: Secondary | ICD-10-CM

## 2011-10-26 DIAGNOSIS — I4891 Unspecified atrial fibrillation: Secondary | ICD-10-CM

## 2011-10-26 LAB — POCT INR: INR: 1.7

## 2011-11-16 ENCOUNTER — Encounter: Payer: Medicare Other | Admitting: *Deleted

## 2011-11-21 ENCOUNTER — Other Ambulatory Visit: Payer: Self-pay | Admitting: *Deleted

## 2011-11-21 MED ORDER — WARFARIN SODIUM 5 MG PO TABS: 5.0000 mg | ORAL_TABLET | ORAL | Status: DC

## 2011-11-23 ENCOUNTER — Ambulatory Visit (INDEPENDENT_AMBULATORY_CARE_PROVIDER_SITE_OTHER): Payer: Medicare Other | Admitting: *Deleted

## 2011-11-23 DIAGNOSIS — I4891 Unspecified atrial fibrillation: Secondary | ICD-10-CM | POA: Diagnosis not present

## 2011-11-23 DIAGNOSIS — Z7901 Long term (current) use of anticoagulants: Secondary | ICD-10-CM

## 2011-11-23 DIAGNOSIS — I482 Chronic atrial fibrillation, unspecified: Secondary | ICD-10-CM

## 2011-11-23 LAB — POCT INR: INR: 3

## 2011-11-24 DIAGNOSIS — M169 Osteoarthritis of hip, unspecified: Secondary | ICD-10-CM | POA: Diagnosis not present

## 2011-12-19 ENCOUNTER — Other Ambulatory Visit: Payer: Self-pay | Admitting: *Deleted

## 2011-12-19 MED ORDER — FUROSEMIDE 40 MG PO TABS: ORAL_TABLET | ORAL | Status: DC

## 2011-12-19 NOTE — Telephone Encounter (Signed)
Refilled furosemide

## 2011-12-21 ENCOUNTER — Encounter: Payer: Medicare Other | Admitting: *Deleted

## 2011-12-27 ENCOUNTER — Other Ambulatory Visit: Payer: Self-pay | Admitting: Cardiology

## 2011-12-27 MED ORDER — FUROSEMIDE 40 MG PO TABS: ORAL_TABLET | ORAL | Status: DC

## 2011-12-28 ENCOUNTER — Ambulatory Visit (INDEPENDENT_AMBULATORY_CARE_PROVIDER_SITE_OTHER): Payer: Medicare Other | Admitting: *Deleted

## 2011-12-28 ENCOUNTER — Other Ambulatory Visit: Payer: Self-pay | Admitting: *Deleted

## 2011-12-28 DIAGNOSIS — I4891 Unspecified atrial fibrillation: Secondary | ICD-10-CM

## 2011-12-28 DIAGNOSIS — Z7901 Long term (current) use of anticoagulants: Secondary | ICD-10-CM | POA: Diagnosis not present

## 2011-12-28 DIAGNOSIS — I482 Chronic atrial fibrillation, unspecified: Secondary | ICD-10-CM

## 2011-12-28 LAB — POCT INR: INR: 2.5

## 2012-01-26 ENCOUNTER — Ambulatory Visit (INDEPENDENT_AMBULATORY_CARE_PROVIDER_SITE_OTHER): Payer: Medicare Other | Admitting: Cardiology

## 2012-01-26 ENCOUNTER — Ambulatory Visit (INDEPENDENT_AMBULATORY_CARE_PROVIDER_SITE_OTHER): Payer: Medicare Other | Admitting: *Deleted

## 2012-01-26 ENCOUNTER — Encounter: Payer: Self-pay | Admitting: Cardiology

## 2012-01-26 VITALS — BP 134/62 | HR 74 | Ht 65.0 in | Wt 156.0 lb

## 2012-01-26 DIAGNOSIS — I119 Hypertensive heart disease without heart failure: Secondary | ICD-10-CM

## 2012-01-26 DIAGNOSIS — I340 Nonrheumatic mitral (valve) insufficiency: Secondary | ICD-10-CM

## 2012-01-26 DIAGNOSIS — Z7901 Long term (current) use of anticoagulants: Secondary | ICD-10-CM

## 2012-01-26 DIAGNOSIS — I10 Essential (primary) hypertension: Secondary | ICD-10-CM | POA: Diagnosis not present

## 2012-01-26 DIAGNOSIS — I4891 Unspecified atrial fibrillation: Secondary | ICD-10-CM

## 2012-01-26 DIAGNOSIS — I059 Rheumatic mitral valve disease, unspecified: Secondary | ICD-10-CM | POA: Diagnosis not present

## 2012-01-26 DIAGNOSIS — E7800 Pure hypercholesterolemia, unspecified: Secondary | ICD-10-CM

## 2012-01-26 DIAGNOSIS — I482 Chronic atrial fibrillation, unspecified: Secondary | ICD-10-CM

## 2012-01-26 DIAGNOSIS — M205X9 Other deformities of toe(s) (acquired), unspecified foot: Secondary | ICD-10-CM | POA: Diagnosis not present

## 2012-01-26 LAB — HEPATIC FUNCTION PANEL
ALT: 15 U/L (ref 0–35)
AST: 22 U/L (ref 0–37)
Bilirubin, Direct: 0 mg/dL (ref 0.0–0.3)
Total Bilirubin: 0.3 mg/dL (ref 0.3–1.2)

## 2012-01-26 LAB — LIPID PANEL
Cholesterol: 157 mg/dL (ref 0–200)
HDL: 70.1 mg/dL (ref >39.00)
LDL Cholesterol: 69 mg/dL (ref 0–99)
Total CHOL/HDL Ratio: 2
Triglycerides: 91 mg/dL (ref 0.0–149.0)
VLDL: 18.2 mg/dL (ref 0.0–40.0)

## 2012-01-26 LAB — CBC WITH DIFFERENTIAL/PLATELET
Basophils Absolute: 0 10*3/uL (ref 0.0–0.1)
Eosinophils Absolute: 0.1 10*3/uL (ref 0.0–0.7)
Eosinophils Relative: 3.9 % (ref 0.0–5.0)
Lymphocytes Relative: 23 % (ref 12.0–46.0)
Lymphs Abs: 0.9 10*3/uL (ref 0.7–4.0)
MCV: 94.4 fl (ref 78.0–100.0)
Monocytes Absolute: 0.6 10*3/uL (ref 0.1–1.0)
Monocytes Relative: 15.8 % — ABNORMAL HIGH (ref 3.0–12.0)
Neutrophils Relative %: 56.2 % (ref 43.0–77.0)
Platelets: 225 10*3/uL (ref 150.0–400.0)
RDW: 15.8 % — ABNORMAL HIGH (ref 11.5–14.6)
WBC: 3.8 10*3/uL — ABNORMAL LOW (ref 4.5–10.5)

## 2012-01-26 LAB — BASIC METABOLIC PANEL
BUN: 15 mg/dL (ref 6–23)
Calcium: 9.3 mg/dL (ref 8.4–10.5)
Creatinine, Ser: 0.8 mg/dL (ref 0.4–1.2)
GFR: 69.71 mL/min (ref >60.00)
Glucose, Bld: 91 mg/dL (ref 70–99)

## 2012-01-26 NOTE — Assessment & Plan Note (Signed)
She is on chronic Lipitor therapy. We'll check fasting lab work today including chemistries, lipid panel, and CBC.

## 2012-01-26 NOTE — Progress Notes (Signed)
[name] Date of Birth: 1932/07/21   History of Present Illness: [name] is seen today for followup. She states she's doing very well. She remains very active with her exercise program and walking. She denies any chest pain or shortness of breath. She has had no bronchial infections this winter. She is still adjusting to living in [place]. She is seeing Dr. Simonne Come for a hammertoe and may require surgery.  Current Outpatient Prescriptions on File Prior to Visit  Medication Sig Dispense Refill  . Ascorbic Acid (VITAMIN C) 1000 MG tablet Take 1,000 mg by mouth daily.        Marland Kitchen atorvastatin (LIPITOR) 20 MG tablet Take 1 tablet by mouth Daily.      . Biotin 1000 MCG tablet Take 1,000 mcg by mouth daily.        . Calcium Carbonate-Vitamin D (CALCIUM + D PO) Take by mouth daily.        . Cholecalciferol (VITAMIN D PO) Take by mouth daily.        Marland Kitchen diltiazem (CARDIZEM CD) 120 MG 24 hr capsule TAKE ONE CAPSULE EVERY DAY  30 capsule  5  . estrogens, conjugated, (PREMARIN) 1.25 MG tablet Take 1.25 mg by mouth daily.        . furosemide (LASIX) 40 MG tablet Take 2 tablets in the am and 1 tablet in the pm  90 tablet  6  . gabapentin (NEURONTIN) 100 MG capsule Take 1 tablet by mouth Daily.      Marland Kitchen HYDROcodone-acetaminophen (NORCO) 10-325 MG per tablet Take 1 tablet by mouth every 6 (six) hours as needed.        Marland Kitchen lisinopril (PRINIVIL,ZESTRIL) 20 MG tablet Take 1 tablet by mouth Daily.      . Magnesium 400 MG CAPS Take by mouth daily.        . Multiple Vitamins-Minerals (ZINC PO) Take by mouth daily.        Marland Kitchen PARoxetine (PAXIL-CR) 37.5 MG 24 hr tablet Take 37.5 mg by mouth every morning.        . Potassium (POTASSIMIN PO) Take 595 mg by mouth daily. 3 TABLETS DAILY      . pyridOXINE (VITAMIN B-6) 100 MG tablet Take 100 mg by mouth daily.        . vitamin A 8000 UNIT capsule Take 8,000 Units by mouth daily.        Marland Kitchen warfarin (COUMADIN) 5 MG tablet Take 1 tablet (5 mg total) by mouth  as directed.  30 tablet  3  . DISCONTD: diltiazem (CARDIZEM) 120 MG tablet Take 1 tablet (120 mg total) by mouth daily.  30 tablet  5    Allergies  Allergen Reactions  . Codeine   . Penicillins   . Sulfonamide Derivatives     Past Medical History  Diagnosis Date  . Mitral insufficiency     CHRONIC  . Hypertensive heart disease     WITH LVH  . LVH (left ventricular hypertrophy) due to hypertensive disease   . Chronic atrial fibrillation   . Pulmonary hypertension   . Hypercholesterolemia   . Back pain, chronic   . Depression     Past Surgical History  Procedure Date  . Total hip arthroplasty     BILATERAL  . Bladder surgery   . Abdominal hysterectomy     History  Smoking status  . Never Smoker   Smokeless tobacco  . Never Used    History  Alcohol Use No  Family History  Problem Relation Age of Onset  . Heart disease Mother 7  . Prostate cancer Father 8    Review of Systems:  As noted in history of present illness. All other systems were reviewed and are negative.  Physical Exam: BP 134/62  Pulse 74  Ht 5\' 5"  (1.651 m)  Wt 70.761 kg (156 lb)  BMI 25.96 kg/m2 She is a pleasant white female in no acute distress. Her HEENT exam is unremarkable. Sclera are clear. Oropharynx is clear. Neck is supple without JVD, adenopathy, thyromegaly, or bruits. Lungs are clear. Cardiac exam reveals a regular rate and rhythm without gallop. There is a grade 2/6 systolic murmur heard best at the left sternal border. Abdomen is soft and nontender. She has no significant edema. Pedal pulses are palpable. She is alert and oriented x3. Cranial nerves II through XII are intact.  LABORATORY DATA:   Assessment / Plan:

## 2012-01-26 NOTE — Assessment & Plan Note (Signed)
Blood pressure is under excellent control today. We will continue her current antihypertensive therapy.

## 2012-01-26 NOTE — Patient Instructions (Addendum)
Continue your current medication.  We will check fasting lab work today.  I will see you again in 6 months.

## 2012-01-26 NOTE — Assessment & Plan Note (Signed)
Her atrial fibrillation rate is well controlled. She is currently asymptomatic. She is on chronic anticoagulation with Coumadin with a therapeutic INR of 2.9 today. The newer anticoagulant agents were too expensive for her. She does note bruising on her forearms but otherwise has had no significant bleeding.

## 2012-01-26 NOTE — Assessment & Plan Note (Signed)
She has a history of moderate mitral and tricuspid insufficiency. She is asymptomatic. Her exam is unchanged. We'll continue with her current medical management.

## 2012-01-27 ENCOUNTER — Other Ambulatory Visit: Payer: Self-pay

## 2012-02-17 ENCOUNTER — Other Ambulatory Visit: Payer: Medicare Other

## 2012-02-21 ENCOUNTER — Other Ambulatory Visit: Payer: Self-pay

## 2012-02-21 MED ORDER — DILTIAZEM HCL ER COATED BEADS 120 MG PO CP24: 120.0000 mg | ORAL_CAPSULE | Freq: Every day | ORAL | Status: DC

## 2012-02-23 ENCOUNTER — Other Ambulatory Visit (INDEPENDENT_AMBULATORY_CARE_PROVIDER_SITE_OTHER): Payer: Medicare Other

## 2012-02-23 ENCOUNTER — Encounter (HOSPITAL_BASED_OUTPATIENT_CLINIC_OR_DEPARTMENT_OTHER): Payer: Self-pay | Admitting: *Deleted

## 2012-02-23 ENCOUNTER — Telehealth: Payer: Self-pay | Admitting: Pharmacist

## 2012-02-23 ENCOUNTER — Ambulatory Visit (INDEPENDENT_AMBULATORY_CARE_PROVIDER_SITE_OTHER): Payer: Medicare Other | Admitting: Pharmacist

## 2012-02-23 DIAGNOSIS — M542 Cervicalgia: Secondary | ICD-10-CM | POA: Diagnosis not present

## 2012-02-23 DIAGNOSIS — M5137 Other intervertebral disc degeneration, lumbosacral region: Secondary | ICD-10-CM | POA: Diagnosis not present

## 2012-02-23 DIAGNOSIS — G894 Chronic pain syndrome: Secondary | ICD-10-CM | POA: Diagnosis not present

## 2012-02-23 DIAGNOSIS — I119 Hypertensive heart disease without heart failure: Secondary | ICD-10-CM

## 2012-02-23 DIAGNOSIS — I4891 Unspecified atrial fibrillation: Secondary | ICD-10-CM

## 2012-02-23 DIAGNOSIS — I482 Chronic atrial fibrillation, unspecified: Secondary | ICD-10-CM

## 2012-02-23 DIAGNOSIS — Z7901 Long term (current) use of anticoagulants: Secondary | ICD-10-CM | POA: Diagnosis not present

## 2012-02-23 LAB — BASIC METABOLIC PANEL
BUN: 15 mg/dL (ref 6–23)
CO2: 28 mEq/L (ref 19–32)
Calcium: 9.1 mg/dL (ref 8.4–10.5)
Chloride: 104 mEq/L (ref 96–112)
Creatinine, Ser: 0.7 mg/dL (ref 0.4–1.2)
GFR: 81.95 mL/min (ref >60.00)
Glucose, Bld: 89 mg/dL (ref 70–99)
Potassium: 4.3 mEq/L (ref 3.5–5.1)
Sodium: 139 mEq/L (ref 135–145)

## 2012-02-23 LAB — POCT INR: INR: 2.3

## 2012-02-23 NOTE — Telephone Encounter (Signed)
Message copied by Ival Bible on Thu Feb 23, 2012  1:31 PM ------      Message from: [place], PETER M      Created: Thu Feb 23, 2012 12:28 PM      Regarding: RE: Pre-op coumadin hold       She is cleared for this and doesn't need to be bridged.      Peter [place] MD, [facility]                  ----- Message -----         From: Ival Bible, PHARMD         Sent: 02/23/2012  12:04 PM           To: Peter M [place], MD, Charna Elizabeth, LPN      Subject: Pre-op coumadin hold                                     Dr. [place],            Ms. Azzaro has toe surgery scheduled next Tues (4/23) and was instructed to hold her coumadin for 5 days.  Is she cleared for this and does she need any bridging?            Thank you,      Albertine Grates, PharmD

## 2012-02-24 ENCOUNTER — Other Ambulatory Visit: Payer: Self-pay | Admitting: Orthopedic Surgery

## 2012-02-27 NOTE — Progress Notes (Signed)
Pt never returned call for review health history or pre-op instructions-Dr Aplington's office made aware.Message left on voice mail to arrive at 1030 in am -npo after mn-to take cardizem w/sip water only.-will need CXR,Istat on arrival.

## 2012-02-28 ENCOUNTER — Encounter (HOSPITAL_BASED_OUTPATIENT_CLINIC_OR_DEPARTMENT_OTHER): Payer: Self-pay

## 2012-02-28 ENCOUNTER — Ambulatory Visit (HOSPITAL_COMMUNITY): Payer: Medicare Other

## 2012-02-28 ENCOUNTER — Encounter (HOSPITAL_BASED_OUTPATIENT_CLINIC_OR_DEPARTMENT_OTHER): Payer: Self-pay | Admitting: Certified Registered"

## 2012-02-28 ENCOUNTER — Encounter (HOSPITAL_BASED_OUTPATIENT_CLINIC_OR_DEPARTMENT_OTHER): Payer: Self-pay | Admitting: *Deleted

## 2012-02-28 ENCOUNTER — Encounter (HOSPITAL_BASED_OUTPATIENT_CLINIC_OR_DEPARTMENT_OTHER)
Admission: RE | Disposition: A | Discharge status: Home / Self Care | Payer: Self-pay | Source: Ambulatory Visit | Attending: Orthopedic Surgery

## 2012-02-28 ENCOUNTER — Ambulatory Visit (HOSPITAL_BASED_OUTPATIENT_CLINIC_OR_DEPARTMENT_OTHER)
Admission: RE | Admit: 2012-02-28 | Discharge: 2012-02-28 | Disposition: A | Discharge status: Home / Self Care | Payer: Medicare Other | Source: Ambulatory Visit | Attending: Orthopedic Surgery | Admitting: Orthopedic Surgery

## 2012-02-28 ENCOUNTER — Ambulatory Visit (HOSPITAL_BASED_OUTPATIENT_CLINIC_OR_DEPARTMENT_OTHER): Payer: Medicare Other

## 2012-02-28 DIAGNOSIS — I2510 Atherosclerotic heart disease of native coronary artery without angina pectoris: Secondary | ICD-10-CM | POA: Insufficient documentation

## 2012-02-28 DIAGNOSIS — Z01811 Encounter for preprocedural respiratory examination: Secondary | ICD-10-CM | POA: Diagnosis not present

## 2012-02-28 DIAGNOSIS — I4891 Unspecified atrial fibrillation: Secondary | ICD-10-CM | POA: Diagnosis not present

## 2012-02-28 DIAGNOSIS — Z79899 Other long term (current) drug therapy: Secondary | ICD-10-CM | POA: Insufficient documentation

## 2012-02-28 DIAGNOSIS — E7800 Pure hypercholesterolemia, unspecified: Secondary | ICD-10-CM | POA: Insufficient documentation

## 2012-02-28 DIAGNOSIS — M205X9 Other deformities of toe(s) (acquired), unspecified foot: Secondary | ICD-10-CM | POA: Diagnosis not present

## 2012-02-28 DIAGNOSIS — Q6689 Other  specified congenital deformities of feet: Secondary | ICD-10-CM

## 2012-02-28 DIAGNOSIS — Z96649 Presence of unspecified artificial hip joint: Secondary | ICD-10-CM | POA: Insufficient documentation

## 2012-02-28 DIAGNOSIS — Q667 Congenital pes cavus, unspecified foot: Secondary | ICD-10-CM | POA: Diagnosis not present

## 2012-02-28 DIAGNOSIS — L84 Corns and callosities: Secondary | ICD-10-CM | POA: Diagnosis not present

## 2012-02-28 DIAGNOSIS — M2040 Other hammer toe(s) (acquired), unspecified foot: Secondary | ICD-10-CM | POA: Diagnosis not present

## 2012-02-28 HISTORY — DX: Atherosclerotic heart disease of native coronary artery without angina pectoris: I25.10

## 2012-02-28 HISTORY — PX: HAMMER TOE SURGERY: SHX385

## 2012-02-28 LAB — POCT I-STAT 4, (NA,K, GLUC, HGB,HCT)
Hemoglobin: 13.3 g/dL (ref 12.0–15.0)
Potassium: 4 mEq/L (ref 3.5–5.1)

## 2012-02-28 LAB — PROTIME-INR
INR: 1.14 (ref 0.00–1.49)
Prothrombin Time: 14.8 seconds (ref 11.6–15.2)

## 2012-02-28 SURGERY — CORRECTION, HAMMER TOE
Anesthesia: General | Site: Foot | Laterality: Right | Wound class: Clean

## 2012-02-28 MED ORDER — CEFAZOLIN SODIUM 1-5 GM-% IV SOLN: 1.0000 g | INTRAVENOUS | Status: DC

## 2012-02-28 MED ORDER — FENTANYL CITRATE 0.05 MG/ML IJ SOLN
INTRAMUSCULAR | Status: DC | PRN
  Administered 2012-02-28: 50 ug via INTRAVENOUS
  Administered 2012-02-28: 25 ug via INTRAVENOUS

## 2012-02-28 MED ORDER — PROPOFOL 10 MG/ML IV EMUL
INTRAVENOUS | Status: DC | PRN
  Administered 2012-02-28: 150 mg via INTRAVENOUS

## 2012-02-28 MED ORDER — FENTANYL CITRATE 0.05 MG/ML IJ SOLN
25.0000 ug | INTRAMUSCULAR | Status: DC | PRN
  Administered 2012-02-28 (×3): 25 ug via INTRAVENOUS

## 2012-02-28 MED ORDER — HYDROCODONE-ACETAMINOPHEN 5-325 MG PO TABS
1.0000 | ORAL_TABLET | Freq: Once | ORAL | Status: AC
  Administered 2012-02-28: 1 via ORAL

## 2012-02-28 MED ORDER — VANCOMYCIN HCL 1000 MG IV SOLR
INTRAVENOUS | Status: DC | PRN
  Administered 2012-02-28: 1000 mg

## 2012-02-28 MED ORDER — LACTATED RINGERS IV SOLN
INTRAVENOUS | Status: DC
  Administered 2012-02-28: 100 mL/h via INTRAVENOUS
  Administered 2012-02-28: 12:00:00 via INTRAVENOUS

## 2012-02-28 MED ORDER — POVIDONE-IODINE 7.5 % EX SOLN: Freq: Once | CUTANEOUS | Status: DC

## 2012-02-28 MED ORDER — BUPIVACAINE HCL (PF) 0.25 % IJ SOLN
INTRAMUSCULAR | Status: DC | PRN
  Administered 2012-02-28: 4 mL

## 2012-02-28 MED ORDER — ONDANSETRON HCL 4 MG/2ML IJ SOLN
INTRAMUSCULAR | Status: DC | PRN
  Administered 2012-02-28: 4 mg via INTRAVENOUS

## 2012-02-28 MED ORDER — LIDOCAINE HCL (CARDIAC) 20 MG/ML IV SOLN
INTRAVENOUS | Status: DC | PRN
  Administered 2012-02-28: 50 mg via INTRAVENOUS

## 2012-02-28 MED ORDER — 0.9 % SODIUM CHLORIDE (POUR BTL) OPTIME
TOPICAL | Status: DC | PRN
  Administered 2012-02-28: 500 mL

## 2012-02-28 MED ORDER — LACTATED RINGERS IV SOLN: INTRAVENOUS | Status: DC

## 2012-02-28 SURGICAL SUPPLY — 59 items
BANDAGE CONFORM 3  STR LF (GAUZE/BANDAGES/DRESSINGS) ×2 IMPLANT
BANDAGE ELASTIC 3 VELCRO ST LF (GAUZE/BANDAGES/DRESSINGS) ×2 IMPLANT
BANDAGE ELASTIC 4 VELCRO ST LF (GAUZE/BANDAGES/DRESSINGS) IMPLANT
BANDAGE ESMARK 6X9 LF (GAUZE/BANDAGES/DRESSINGS) ×1 IMPLANT
BLADE FLAT COURSE (BLADE) IMPLANT
BLADE FLAT FINE (BLADE) IMPLANT
BLADE SURG 15 STRL LF DISP TIS (BLADE) ×2 IMPLANT
BLADE SURG 15 STRL SS (BLADE) ×4
BNDG CMPR 9X6 STRL LF SNTH (GAUZE/BANDAGES/DRESSINGS) ×1
BNDG COHESIVE 3X5 TAN STRL LF (GAUZE/BANDAGES/DRESSINGS) IMPLANT
BNDG ESMARK 6X9 LF (GAUZE/BANDAGES/DRESSINGS) ×2
CLOTH BEACON ORANGE TIMEOUT ST (SAFETY) ×2 IMPLANT
CORDS BIPOLAR (ELECTRODE) ×2 IMPLANT
COVER TABLE BACK 60X90 (DRAPES) ×2 IMPLANT
DRAPE EXTREMITY T 121X128X90 (DRAPE) ×2 IMPLANT
DRAPE LG THREE QUARTER DISP (DRAPES) ×4 IMPLANT
DRAPE U-SHAPE 47X51 STRL (DRAPES) ×2 IMPLANT
DRSG EMULSION OIL 3X3 NADH (GAUZE/BANDAGES/DRESSINGS) ×2 IMPLANT
DURAPREP 26ML APPLICATOR (WOUND CARE) ×2 IMPLANT
ELECT REM PT RETURN 9FT ADLT (ELECTROSURGICAL) ×2
ELECTRODE REM PT RTRN 9FT ADLT (ELECTROSURGICAL) ×1 IMPLANT
GLOVE BIO SURGEON STRL SZ7 (GLOVE) ×2 IMPLANT
GLOVE BIOGEL PI IND STRL 8 (GLOVE) IMPLANT
GLOVE BIOGEL PI INDICATOR 8 (GLOVE)
GLOVE ECLIPSE 8.0 STRL XLNG CF (GLOVE) IMPLANT
GLOVE INDICATOR 7.0 STRL GRN (GLOVE) ×2 IMPLANT
GLOVE INDICATOR 8.0 STRL GRN (GLOVE) ×2 IMPLANT
GOWN SURGICAL LARGE (GOWNS) ×2 IMPLANT
GOWN W/COTTON TOWEL STD LRG (GOWNS) ×2 IMPLANT
GOWN XL W/COTTON TOWEL STD (GOWNS) ×2 IMPLANT
K-WIRE CAPS BLUE STERILE .035 (WIRE)
K-WIRE CAPS STERILE WHITE .045 (WIRE) ×2 IMPLANT
KWIRE 4.0 X .045IN (WIRE) ×2 IMPLANT
KWIRE CAPS BLUE STRL .035 (WIRE) IMPLANT
NEEDLE 27GAX1X1/2 (NEEDLE) IMPLANT
NEEDLE HYPO 22GX1.5 SAFETY (NEEDLE) ×2 IMPLANT
NS IRRIG 500ML POUR BTL (IV SOLUTION) ×2 IMPLANT
PACK BASIN DAY SURGERY FS (CUSTOM PROCEDURE TRAY) ×2 IMPLANT
PAD CAST 3X4 CTTN HI CHSV (CAST SUPPLIES) IMPLANT
PAD CAST 4YDX4 CTTN HI CHSV (CAST SUPPLIES) ×1 IMPLANT
PADDING CAST ABS 4INX4YD NS (CAST SUPPLIES) ×1
PADDING CAST ABS COTTON 4X4 ST (CAST SUPPLIES) ×1 IMPLANT
PADDING CAST COTTON 3X4 STRL (CAST SUPPLIES)
PADDING CAST COTTON 4X4 STRL (CAST SUPPLIES) ×2
PENCIL BUTTON HOLSTER BLD 10FT (ELECTRODE) IMPLANT
SHOE DARCO WOMENS LG 8.5-10 PD (MISCELLANEOUS) ×1 IMPLANT
SHOE DARCO WOMENS LRG (MISCELLANEOUS) ×2
SPONGE GAUZE 4X4 12PLY (GAUZE/BANDAGES/DRESSINGS) ×2 IMPLANT
STOCKINETTE 4X48 STRL (DRAPES) ×2 IMPLANT
STRYKER K-WIRE ×2 IMPLANT
SUT ETHILON 4 0 PS 2 18 (SUTURE) ×4 IMPLANT
SUT VIC AB 4-0 P-3 18XBRD (SUTURE) ×1 IMPLANT
SUT VIC AB 4-0 P3 18 (SUTURE) ×2
SYR BULB 3OZ (MISCELLANEOUS) ×2 IMPLANT
SYR CONTROL 10ML LL (SYRINGE) IMPLANT
TOWEL NATURAL 6PK STERILE (DISPOSABLE) ×4 IMPLANT
TOWEL OR 17X24 6PK STRL BLUE (TOWEL DISPOSABLE) ×4 IMPLANT
UNDERPAD 30X30 INCONTINENT (UNDERPADS AND DIAPERS) ×2 IMPLANT
WATER STERILE IRR 500ML POUR (IV SOLUTION) IMPLANT

## 2012-02-28 NOTE — H&P (Signed)
[name] is an 78 y.o. female.   Chief Complaint:painful rt third toe HPIproressive clawing rt third toe with dorsal callosity  Past Medical History  Diagnosis Date  . Mitral insufficiency     CHRONIC  . Hypertensive heart disease     WITH LVH  . LVH (left ventricular hypertrophy) due to hypertensive disease   . Chronic atrial fibrillation   . Pulmonary hypertension   . Hypercholesterolemia   . Back pain, chronic   . Depression   . Coronary artery disease   . Blood transfusion     Past Surgical History  Procedure Date  . Total hip arthroplasty     BILATERAL---  LEFT 4/98;   RIGHT  11/98  . Abdominal hysterectomy   . Varus osteotomy proximal phalanx, left great toe/ paring down the callus , second left toe 10-27-2008  . Repair right second claw toe/ varus medial closing wedge osteotomy proximal phalanx right great toe 05-05-2005  . Anterior repair/ sparc pubovaginal sling 05-03-2002  . Pars plana vitrectomy w/ repair of macular hole 02-04-2002    LEFT EYE  . Cardiac catheterization 02-03-2009  DR  [place]    SINGLE-VESSEL OBSTRUCTIVE CAD, SMALL DIAGONAL BRANCH/ NORMAL LVF/ SEVERE MITRAL INSUFFICIENCY/ SEVERE PULMONARY HYPERTENSION  . Cataract extraction w/ intraocular lens  implant, bilateral 2003    Family History  Problem Relation Age of Onset  . Heart disease Mother 39  . Prostate cancer Father 18   Social History:  reports that she has never smoked. She has never used smokeless tobacco. She reports that she does not drink alcohol or use illicit drugs.  Allergies:  Allergies  Allergen Reactions  . Codeine   . Penicillins   . Sulfonamide Derivatives     Medications Prior to Admission  Medication Sig Dispense Refill  . Ascorbic Acid (VITAMIN C) 1000 MG tablet Take 1,000 mg by mouth daily.        Marland Kitchen atorvastatin (LIPITOR) 20 MG tablet Take 1 tablet by mouth Daily.      . Biotin 1000 MCG tablet Take 1,000 mcg by mouth daily.        . Calcium  Carbonate-Vitamin D (CALCIUM + D PO) Take by mouth daily.        . Cholecalciferol (VITAMIN D PO) Take by mouth daily.        Marland Kitchen diltiazem (CARDIZEM CD) 120 MG 24 hr capsule Take 1 capsule (120 mg total) by mouth daily.  30 capsule  5  . estrogens, conjugated, (PREMARIN) 1.25 MG tablet Take 1.25 mg by mouth daily.        . furosemide (LASIX) 40 MG tablet Take 2 tablets in the am and 1 tablet in the pm  90 tablet  6  . gabapentin (NEURONTIN) 100 MG capsule Take 1 tablet by mouth Daily.      Marland Kitchen HYDROcodone-acetaminophen (NORCO) 10-325 MG per tablet Take 1 tablet by mouth every 6 (six) hours as needed.        Marland Kitchen lisinopril (PRINIVIL,ZESTRIL) 20 MG tablet Take 1 tablet by mouth Daily.      . Magnesium 400 MG CAPS Take by mouth daily.        . Multiple Vitamins-Minerals (ZINC PO) Take by mouth daily.        Marland Kitchen PARoxetine (PAXIL-CR) 37.5 MG 24 hr tablet Take 37.5 mg by mouth every morning.        . Potassium (POTASSIMIN PO) Take 595 mg by mouth daily. 3 TABLETS DAILY      .  pyridOXINE (VITAMIN B-6) 100 MG tablet Take 100 mg by mouth daily.        . vitamin A 8000 UNIT capsule Take 8,000 Units by mouth daily.        Marland Kitchen warfarin (COUMADIN) 5 MG tablet Take 1 tablet (5 mg total) by mouth as directed.  30 tablet  3    Results for orders placed during the hospital encounter of 02/28/12 (from the past 48 hour(s))  PROTIME-INR     Status: Normal   Collection Time   02/28/12 11:25 AM      Component Value Range Comment   Prothrombin Time 14.8  11.6 - 15.2 (seconds)    INR 1.14  0.00 - 1.49    POCT I-STAT 4, (NA,K, GLUC, HGB,HCT)     Status: Normal   Collection Time   02/28/12 11:34 AM      Component Value Range Comment   Sodium 141  135 - 145 (mEq/L)    Potassium 4.0  3.5 - 5.1 (mEq/L)    Glucose, Bld 93  70 - 99 (mg/dL)    HCT 16.1  09.6 - 04.5 (%)    Hemoglobin 13.3  12.0 - 15.0 (g/dL)    No results found.  ROS  Blood pressure 132/83, pulse 82, temperature 98.5 F (36.9 C), temperature source  Oral, resp. rate 18, SpO2 96.00%. Physical Exam  Constitutional: She is oriented to person, place, and time. She appears well-developed and well-nourished.  HENT:  Head: Normocephalic and atraumatic.  Right Ear: External ear normal.  Left Ear: External ear normal.  Nose: Nose normal.  Mouth/Throat: Oropharynx is clear and moist.  Eyes: Conjunctivae and EOM are normal. Pupils are equal, round, and reactive to light.  Neck: Normal range of motion. Neck supple.  Cardiovascular: Normal rate, regular rhythm, normal heart sounds and intact distal pulses.   Respiratory: Effort normal and breath sounds normal.  GI: Soft. Bowel sounds are normal.  Musculoskeletal: Normal range of motion.  Neurological: She is alert and oriented to person, place, and time. She has normal reflexes.  Skin: Skin is warm.  Psychiatric: She has a normal mood and affect. Her behavior is normal. Judgment and thought content normal.     Assessment/Plan Painful rt third clawtoe Correction rt third claw toe  Kateland Leisinger P 02/28/2012, 12:38 PM

## 2012-02-28 NOTE — Transfer of Care (Signed)
Immediate Anesthesia Transfer of Care Note  Patient: [name]  Procedure(s) Performed: Procedure(s) (LRB): HAMMER TOE CORRECTION (Right)  Patient Location: PACU  Anesthesia Type: General  Level of Consciousness: awake and oriented  Airway & Oxygen Therapy: Patient Spontanous Breathing and Patient connected to face mask oxygen  Post-op Assessment: Report given to PACU RN and Post -op Vital signs reviewed and stable  Post vital signs: Reviewed and stable  Complications: No apparent anesthesia complications

## 2012-02-28 NOTE — Anesthesia Postprocedure Evaluation (Signed)
  Anesthesia Post-op Note  Patient: [name]  Procedure(s) Performed: Procedure(s) (LRB): HAMMER TOE CORRECTION (Right)  Patient Location: PACU  Anesthesia Type: General  Level of Consciousness: awake and alert   Airway and Oxygen Therapy: Patient Spontanous Breathing  Post-op Pain: mild  Post-op Assessment: Post-op Vital signs reviewed, Patient's Cardiovascular Status Stable, Respiratory Function Stable, Patent Airway and No signs of Nausea or vomiting  Post-op Vital Signs: stable  Complications: No apparent anesthesia complications

## 2012-02-28 NOTE — Anesthesia Procedure Notes (Signed)
Procedure Name: LMA Insertion Performed by: Lance Coon Pre-anesthesia Checklist: Patient identified, Timeout performed, Emergency Drugs available, Suction available and Patient being monitored Patient Re-evaluated:Patient Re-evaluated prior to inductionOxygen Delivery Method: Circle system utilized Preoxygenation: Pre-oxygenation with 100% oxygen Intubation Type: IV induction Ventilation: Mask ventilation without difficulty LMA: LMA inserted LMA Size: 5.0 and 4.0 Placement Confirmation: breath sounds checked- equal and bilateral and positive ETCO2 Dental Injury: Teeth and Oropharynx as per pre-operative assessment

## 2012-02-28 NOTE — Anesthesia Preprocedure Evaluation (Signed)
Anesthesia Evaluation  Patient identified by MRN, date of birth, ID band Patient awake    Reviewed: Allergy & Precautions, H&P , NPO status , Patient's Chart, lab work & pertinent test results  Airway Mallampati: II TM Distance: >3 FB Neck ROM: full    Dental  (+) Caps, Missing and Dental Advisory Given,    Pulmonary neg pulmonary ROS,  Pulmonary htn. breath sounds clear to auscultation  Pulmonary exam normal       Cardiovascular Exercise Tolerance: Good hypertension, Pt. on medications + CAD negative cardio ROS  + dysrhythmias Atrial Fibrillation Rhythm:regular Rate:Normal + Systolic murmurs Cath. 2010 showed severe single vessel obstruction in a small branch of LAD.  Severe pulmonary htn.  Severe mitral insufficiency.  EF 60%.  She does walk every day with no symptoms.   Neuro/Psych Depression negative neurological ROS  negative psych ROS   GI/Hepatic negative GI ROS, Neg liver ROS,   Endo/Other  negative endocrine ROS  Renal/GU negative Renal ROS  negative genitourinary   Musculoskeletal   Abdominal   Peds  Hematology negative hematology ROS (+)   Anesthesia Other Findings   Reproductive/Obstetrics negative OB ROS                           Anesthesia Physical Anesthesia Plan  ASA: III  Anesthesia Plan: General   Post-op Pain Management:    Induction: Intravenous  Airway Management Planned: LMA  Additional Equipment:   Intra-op Plan:   Post-operative Plan:   Informed Consent: I have reviewed the patients History and Physical, chart, labs and discussed the procedure including the risks, benefits and alternatives for the proposed anesthesia with the patient or authorized representative who has indicated his/her understanding and acceptance.   Dental Advisory Given  Plan Discussed with: CRNA and Surgeon  Anesthesia Plan Comments:         Anesthesia Quick Evaluation

## 2012-02-28 NOTE — Discharge Instructions (Signed)

## 2012-02-28 NOTE — Brief Op Note (Signed)
02/28/2012  2:37 PM  PATIENT:  [name]  78 y.o. female  PRE-OPERATIVE DIAGNOSIS:   PAINFUL RIGHT THIRD CLAW TOE  POST-OPERATIVE DIAGNOSIS:   PAINFUL RIGHT THIRD CLAW TOE  PROCEDURE:  Procedure(s) (LRB): claw TOE CORRECTION (Right)  SURGEON:  Surgeon(s) and Role:    * Drucilla Schmidt, MD - Primary  PHYSICIAN ASSISTANT:   ASSISTANTS: nurse  ANESTHESIA:   general  EBL:     BLOOD ADMINISTERED:none  DRAINS: none   LOCAL MEDICATIONS USED:  MARCAINE     SPECIMEN:  No Specimen  DISPOSITION OF SPECIMEN:  [id number]  COUNTS:  YES  TOURNIQUET:   Total Tourniquet Time Documented: Thigh (Right) - 72 minutes  DICTATION: .Other Dictation: Dictation Number [id number]  PLAN OF CARE: Discharge to home after PACU  PATIENT DISPOSITION:  PACU - hemodynamically stable.   Delay start of Pharmacological VTE agent (>24hrs) due to surgical blood loss or risk of bleeding: not applicable

## 2012-02-29 ENCOUNTER — Telehealth: Payer: Self-pay | Admitting: Cardiology

## 2012-02-29 ENCOUNTER — Encounter (HOSPITAL_BASED_OUTPATIENT_CLINIC_OR_DEPARTMENT_OTHER): Payer: Self-pay | Admitting: Orthopedic Surgery

## 2012-02-29 NOTE — Telephone Encounter (Signed)
Spoke with patient, she understood d/c instructions post op for coumadin dose.  02/28/2012---take an extra half tablet for 2 days, then continue your normal dose of 1/2 tablet daily except for 1 tablet on Wednesday & Friday. rov 03/08/2012.

## 2012-02-29 NOTE — Op Note (Signed)
[name], [name]           ACCOUNT NO.:  [id number]  MEDICAL RECORD NO.:  [id number]  LOCATION:                               FACILITY:  [facility]  PHYSICIAN:  Marlowe Kays, M.D.  DATE OF BIRTH:  11-04-1932  DATE OF PROCEDURE: DATE OF DISCHARGE:                              OPERATIVE REPORT   PREOPERATIVE DIAGNOSIS:  Symptomatic right third claw toe.  POSTOPERATIVE DIAGNOSIS:  Symptomatic right third claw toe.  OPERATION:  Correction of right third claw toe.  SURGEON:  Marlowe Kays, M.D.  ASSISTANT:  Nurse.  ANESTHESIA:  General.  PATHOLOGY AND JUSTIFICATION FOR PROCEDURE:  She had a very tight clawing and had developed a very tender callosity over the dorsum of the PIP joint with the concern that this might lead to an infection.  She has bilateral total hips.  Accordingly, she is here for correction of the claw toe.  PROCEDURE IN DETAIL:  Prophylactic antibiotics, satisfactory general anesthesia, right pneumatic tourniquet applied to right lower extremity with the leg Esmarched out nonsterilely and the tourniquet inflated to 250 mmHg.  Right leg and foot were then prepped with DuraPrep, draped in sterile field.  Time-out performed.  I marked out a curved incision starting distally in the midline, then laterally around the dorsal callosity and then proximally in the midline once again.  With the dorsal extensor __________ splitting incision, I demarcated the PIP joint and with a combination of osteotome and small bone cutter, squared off the endplates cartilaginous edges, so that there was nice apposition of the raw bone to either side.  However, this was not completely correcting the clawing deformity with the toe still being tight and accordingly I extended the incision proximal ward and split the extensor tendon in Z-fashion and performed a dorsal capsulotomy of the MP joint. This still does not completely correct the tightness.  Consequently, I resected the  base of the proximal phalanx at the metaphyseal flare and this did finally loosen up the toe enough that I felt comfortable with the correction.  I then placed a smooth 0.045 K-wire, first from proximal to distal through the middle and distal phalanges and then under direct visualization with the 2 raw bone edges at the PIP joint, tied together and placed it retrograde into the 1st and to the 3rd metatarsal head under direct visualization.  I then irrigated the wound well with sterile saline.  I reapproximated the extensor tendon with interrupted 4-0 Vicryl and the skin and subcutaneous tissue with interrupted 4-0 nylon mattress sutures.  I released the tourniquet with a little over 1 hour of tourniquet time having elapsed and left down from there on to the end of the case.  Looking at the 3rd toe for about 5 minutes until I was sure that pinked up satisfactorily.  The pin distally was bent and cut and capped.  Betadine Adaptic dry sterile dressing were applied.  At time of this dictation, she was on her way to recovery room in satisfied condition with no known complications.          ______________________________ Marlowe Kays, M.D.     JA/MEDQ  D:  02/28/2012  T:  02/29/2012  Job:  [id number]

## 2012-02-29 NOTE — Telephone Encounter (Signed)
Patient called wanting to make sure she understands how to take her coumadin.States she came home yesterday from surgery on toe.Patient was told message will be sent to coumadin clinic and they will call her back.

## 2012-02-29 NOTE — Telephone Encounter (Signed)
Please return call to patient concerning coumadin instructions, she can be reached at [phone].

## 2012-03-02 DIAGNOSIS — M2040 Other hammer toe(s) (acquired), unspecified foot: Secondary | ICD-10-CM | POA: Diagnosis not present

## 2012-03-13 ENCOUNTER — Ambulatory Visit (INDEPENDENT_AMBULATORY_CARE_PROVIDER_SITE_OTHER): Payer: Medicare Other | Admitting: Pharmacist

## 2012-03-13 DIAGNOSIS — Z7901 Long term (current) use of anticoagulants: Secondary | ICD-10-CM | POA: Diagnosis not present

## 2012-03-13 DIAGNOSIS — I4891 Unspecified atrial fibrillation: Secondary | ICD-10-CM | POA: Diagnosis not present

## 2012-03-13 DIAGNOSIS — I482 Chronic atrial fibrillation, unspecified: Secondary | ICD-10-CM

## 2012-03-13 LAB — POCT INR: INR: 2.3

## 2012-03-19 DIAGNOSIS — M2040 Other hammer toe(s) (acquired), unspecified foot: Secondary | ICD-10-CM | POA: Diagnosis not present

## 2012-03-20 ENCOUNTER — Other Ambulatory Visit: Payer: Self-pay | Admitting: *Deleted

## 2012-03-20 MED ORDER — WARFARIN SODIUM 5 MG PO TABS: ORAL_TABLET | ORAL | Status: DC

## 2012-04-05 DIAGNOSIS — M25539 Pain in unspecified wrist: Secondary | ICD-10-CM | POA: Diagnosis not present

## 2012-04-26 ENCOUNTER — Ambulatory Visit (INDEPENDENT_AMBULATORY_CARE_PROVIDER_SITE_OTHER): Payer: Medicare Other | Admitting: *Deleted

## 2012-04-26 DIAGNOSIS — Z7901 Long term (current) use of anticoagulants: Secondary | ICD-10-CM | POA: Diagnosis not present

## 2012-04-26 DIAGNOSIS — I4891 Unspecified atrial fibrillation: Secondary | ICD-10-CM | POA: Diagnosis not present

## 2012-04-26 DIAGNOSIS — I482 Chronic atrial fibrillation, unspecified: Secondary | ICD-10-CM

## 2012-05-21 ENCOUNTER — Ambulatory Visit (INDEPENDENT_AMBULATORY_CARE_PROVIDER_SITE_OTHER): Payer: Medicare Other | Admitting: *Deleted

## 2012-05-21 DIAGNOSIS — I119 Hypertensive heart disease without heart failure: Secondary | ICD-10-CM | POA: Diagnosis not present

## 2012-05-21 DIAGNOSIS — E785 Hyperlipidemia, unspecified: Secondary | ICD-10-CM | POA: Diagnosis not present

## 2012-05-21 DIAGNOSIS — I4891 Unspecified atrial fibrillation: Secondary | ICD-10-CM

## 2012-05-21 DIAGNOSIS — I10 Essential (primary) hypertension: Secondary | ICD-10-CM | POA: Diagnosis not present

## 2012-05-21 DIAGNOSIS — I482 Chronic atrial fibrillation, unspecified: Secondary | ICD-10-CM

## 2012-05-21 DIAGNOSIS — E559 Vitamin D deficiency, unspecified: Secondary | ICD-10-CM | POA: Diagnosis not present

## 2012-05-21 DIAGNOSIS — Z7901 Long term (current) use of anticoagulants: Secondary | ICD-10-CM | POA: Diagnosis not present

## 2012-05-21 DIAGNOSIS — Z79899 Other long term (current) drug therapy: Secondary | ICD-10-CM | POA: Diagnosis not present

## 2012-05-21 DIAGNOSIS — Z0000 Encounter for general adult medical examination without abnormal findings: Secondary | ICD-10-CM | POA: Diagnosis not present

## 2012-05-21 LAB — POCT INR: INR: 2.4

## 2012-06-07 DIAGNOSIS — M25559 Pain in unspecified hip: Secondary | ICD-10-CM | POA: Diagnosis not present

## 2012-06-28 ENCOUNTER — Ambulatory Visit (INDEPENDENT_AMBULATORY_CARE_PROVIDER_SITE_OTHER): Payer: Medicare Other | Admitting: *Deleted

## 2012-06-28 DIAGNOSIS — Z7901 Long term (current) use of anticoagulants: Secondary | ICD-10-CM

## 2012-06-28 DIAGNOSIS — G894 Chronic pain syndrome: Secondary | ICD-10-CM | POA: Diagnosis not present

## 2012-06-28 DIAGNOSIS — I4891 Unspecified atrial fibrillation: Secondary | ICD-10-CM

## 2012-06-28 DIAGNOSIS — M5137 Other intervertebral disc degeneration, lumbosacral region: Secondary | ICD-10-CM | POA: Diagnosis not present

## 2012-06-28 DIAGNOSIS — I482 Chronic atrial fibrillation, unspecified: Secondary | ICD-10-CM

## 2012-06-28 DIAGNOSIS — M542 Cervicalgia: Secondary | ICD-10-CM | POA: Diagnosis not present

## 2012-07-25 DIAGNOSIS — Z23 Encounter for immunization: Secondary | ICD-10-CM | POA: Diagnosis not present

## 2012-07-26 ENCOUNTER — Ambulatory Visit (INDEPENDENT_AMBULATORY_CARE_PROVIDER_SITE_OTHER): Payer: Medicare Other | Admitting: *Deleted

## 2012-07-26 DIAGNOSIS — Z7901 Long term (current) use of anticoagulants: Secondary | ICD-10-CM | POA: Diagnosis not present

## 2012-07-26 DIAGNOSIS — I4891 Unspecified atrial fibrillation: Secondary | ICD-10-CM

## 2012-07-26 DIAGNOSIS — I482 Chronic atrial fibrillation, unspecified: Secondary | ICD-10-CM

## 2012-07-26 LAB — POCT INR: INR: 2.1

## 2012-08-23 ENCOUNTER — Other Ambulatory Visit: Payer: Self-pay

## 2012-08-23 MED ORDER — DILTIAZEM HCL ER COATED BEADS 120 MG PO CP24: 120.0000 mg | ORAL_CAPSULE | Freq: Every day | ORAL | Status: DC

## 2012-08-24 ENCOUNTER — Ambulatory Visit (INDEPENDENT_AMBULATORY_CARE_PROVIDER_SITE_OTHER): Payer: Medicare Other | Admitting: *Deleted

## 2012-08-24 ENCOUNTER — Encounter: Payer: Self-pay | Admitting: Cardiology

## 2012-08-24 ENCOUNTER — Ambulatory Visit (INDEPENDENT_AMBULATORY_CARE_PROVIDER_SITE_OTHER): Payer: Medicare Other | Admitting: Cardiology

## 2012-08-24 VITALS — BP 146/82 | HR 79 | Ht 65.0 in | Wt 154.2 lb

## 2012-08-24 DIAGNOSIS — I482 Chronic atrial fibrillation, unspecified: Secondary | ICD-10-CM

## 2012-08-24 DIAGNOSIS — I4891 Unspecified atrial fibrillation: Secondary | ICD-10-CM | POA: Diagnosis not present

## 2012-08-24 DIAGNOSIS — Z7901 Long term (current) use of anticoagulants: Secondary | ICD-10-CM

## 2012-08-24 DIAGNOSIS — I059 Rheumatic mitral valve disease, unspecified: Secondary | ICD-10-CM

## 2012-08-24 DIAGNOSIS — I119 Hypertensive heart disease without heart failure: Secondary | ICD-10-CM | POA: Diagnosis not present

## 2012-08-24 DIAGNOSIS — Z1231 Encounter for screening mammogram for malignant neoplasm of breast: Secondary | ICD-10-CM | POA: Diagnosis not present

## 2012-08-24 DIAGNOSIS — I340 Nonrheumatic mitral (valve) insufficiency: Secondary | ICD-10-CM

## 2012-08-24 NOTE — Progress Notes (Signed)
[name] Date of Birth: Nov 27, 1931   History of Present Illness: Mrs. Starzyk is seen today for followup. She has done well from a cardiac standpoint. She has had a couple falls recently. On one occasion she was out for her regular walk and tripped and fell hitting her face. She had a lot of abrasions but no other injury. She was then climbing a ladder when she lost her balance and fell back and hit her right shoulder. These both occurred over the past one to 2 months and have resolved. She denies any significant palpitations. Her breathing has been doing very well.  Current Outpatient Prescriptions on File Prior to Visit  Medication Sig Dispense Refill  . Ascorbic Acid (VITAMIN C) 1000 MG tablet Take 1,000 mg by mouth daily.        Marland Kitchen atorvastatin (LIPITOR) 20 MG tablet Take 1 tablet by mouth Daily.      . Biotin 1000 MCG tablet Take 1,000 mcg by mouth daily.        . Calcium Carbonate-Vitamin D (CALCIUM + D PO) Take by mouth daily.        . Cholecalciferol (VITAMIN D PO) Take by mouth daily.        Marland Kitchen diltiazem (CARDIZEM CD) 120 MG 24 hr capsule Take 1 capsule (120 mg total) by mouth daily.  30 capsule  5  . estrogens, conjugated, (PREMARIN) 1.25 MG tablet Take 1.25 mg by mouth daily.        . furosemide (LASIX) 40 MG tablet Take 2 tablets in the am and 1 tablet in the pm  90 tablet  6  . gabapentin (NEURONTIN) 100 MG capsule Take 1 tablet by mouth Daily.      Marland Kitchen HYDROcodone-acetaminophen (NORCO) 10-325 MG per tablet Take 1 tablet by mouth every 6 (six) hours as needed.        Marland Kitchen lisinopril (PRINIVIL,ZESTRIL) 20 MG tablet Take 1 tablet by mouth Daily.      . Magnesium 400 MG CAPS Take by mouth daily.        . Multiple Vitamins-Minerals (ZINC PO) Take by mouth daily.        Marland Kitchen PARoxetine (PAXIL-CR) 37.5 MG 24 hr tablet Take 37.5 mg by mouth every morning.        . Potassium (POTASSIMIN PO) Take 595 mg by mouth daily. 3 TABLETS DAILY      . vitamin A 8000 UNIT capsule Take 8,000  Units by mouth daily.        Marland Kitchen warfarin (COUMADIN) 5 MG tablet Take as directed by coumadin clinic  30 tablet  3  . DISCONTD: diltiazem (CARDIZEM) 120 MG tablet Take 1 tablet (120 mg total) by mouth daily.  30 tablet  5    Allergies  Allergen Reactions  . Codeine   . Penicillins   . Sulfonamide Derivatives     Past Medical History  Diagnosis Date  . Mitral insufficiency     CHRONIC  . Hypertensive heart disease     WITH LVH  . LVH (left ventricular hypertrophy) due to hypertensive disease   . Chronic atrial fibrillation   . Pulmonary hypertension   . Hypercholesterolemia   . Back pain, chronic   . Depression   . Coronary artery disease   . Blood transfusion     Past Surgical History  Procedure Date  . Total hip arthroplasty     BILATERAL---  LEFT 4/98;   RIGHT  11/98  . Abdominal hysterectomy   .  Varus osteotomy proximal phalanx, left great toe/ paring down the callus , second left toe 10-27-2008  . Repair right second claw toe/ varus medial closing wedge osteotomy proximal phalanx right great toe 05-05-2005  . Anterior repair/ sparc pubovaginal sling 05-03-2002  . Pars plana vitrectomy w/ repair of macular hole 02-04-2002    LEFT EYE  . Cardiac catheterization 02-03-2009  DR  [place]    SINGLE-VESSEL OBSTRUCTIVE CAD, SMALL DIAGONAL BRANCH/ NORMAL LVF/ SEVERE MITRAL INSUFFICIENCY/ SEVERE PULMONARY HYPERTENSION  . Cataract extraction w/ intraocular lens  implant, bilateral 2003  . Hammer toe surgery 02/28/2012    Procedure: HAMMER TOE CORRECTION;  Surgeon: Drucilla Schmidt, MD;  Location: [facility];  Service: Orthopedics;  Laterality: Right;  FUSION OF PROXIMAL INTERPHALANGEAL  RIGHT THIRD CLAW TOE    History  Smoking status  . Never Smoker   Smokeless tobacco  . Never Used    History  Alcohol Use No    Family History  Problem Relation Age of Onset  . Heart disease Mother 23  . Prostate cancer Father 46    Review of Systems:  As noted in  history of present illness. All other systems were reviewed and are negative.  Physical Exam: BP 146/82  Pulse 79  Ht 5\' 5"  (1.651 m)  Wt 69.945 kg (154 lb 3.2 oz)  BMI 25.66 kg/m2 She is a pleasant white female in no acute distress. Her HEENT exam is unremarkable. Sclera are clear. Oropharynx is clear. Neck is supple without JVD, adenopathy, thyromegaly, or bruits. Lungs are clear. Cardiac exam reveals a regular rate and rhythm without gallop. There is a grade 2/6 systolic murmur heard best at the left sternal border. Abdomen is soft and nontender. She has no significant edema. Pedal pulses are palpable. She is alert and oriented x3. Cranial nerves II through XII are intact.  LABORATORY DATA: ECG today demonstrates atrial fibrillation with a controlled ventricular spots of 79 beats per minute. It is otherwise normal.  Assessment / Plan: 1. Atrial fibrillation, permanent. Rate is well controlled on diltiazem should. She is anticoagulated on Coumadin with a therapeutic INR today of 2.2.  2. Mitral insufficiency, moderate.  3. Hypertension with hypertensive heart disease, well controlled.  4. Hyperlipidemia on chronic Coumadin therapy. Her last lipid panel showed excellent control.

## 2012-08-24 NOTE — Patient Instructions (Addendum)
Continue your current therapy  I will see you again in 6 months.   

## 2012-09-19 DIAGNOSIS — Z961 Presence of intraocular lens: Secondary | ICD-10-CM | POA: Diagnosis not present

## 2012-09-19 DIAGNOSIS — H40019 Open angle with borderline findings, low risk, unspecified eye: Secondary | ICD-10-CM | POA: Diagnosis not present

## 2012-09-19 DIAGNOSIS — H35379 Puckering of macula, unspecified eye: Secondary | ICD-10-CM | POA: Diagnosis not present

## 2012-09-19 DIAGNOSIS — H353190 Nonexudative age-related macular degeneration, unspecified eye, stage unspecified: Secondary | ICD-10-CM | POA: Diagnosis not present

## 2012-09-19 DIAGNOSIS — H01009 Unspecified blepharitis unspecified eye, unspecified eyelid: Secondary | ICD-10-CM | POA: Diagnosis not present

## 2012-09-24 ENCOUNTER — Telehealth: Payer: Self-pay | Admitting: Cardiology

## 2012-09-24 NOTE — Telephone Encounter (Signed)
Pt having teeth pulled on Thursday needs to stop coumadin today

## 2012-09-24 NOTE — Telephone Encounter (Signed)
Pt having 4 teeth pulled by Dr. Sheria Lang.  Dr. Sheria Lang has not requested pt stop Coumadin; pt just assumed this would need to be done.  Pt is going to call and clarify with dentist then let us know.  She is aware Dr. [place] is not in the office until tomorrow morning to be able to give [place] clearance to stop Coumadin.

## 2012-09-25 ENCOUNTER — Other Ambulatory Visit: Payer: Self-pay | Admitting: *Deleted

## 2012-09-25 MED ORDER — FUROSEMIDE 40 MG PO TABS: ORAL_TABLET | ORAL | Status: DC

## 2012-09-25 NOTE — Telephone Encounter (Signed)
Patient called was told Dr.Jordan received a fax from [facility].Dr.Jordan advised to hold coumadin 3 days prior to having teeth pulled.Form faxed back to [facility] fax # [phone].

## 2012-09-25 NOTE — Telephone Encounter (Signed)
New Problem:    Patient called in to follow-up on her coumadin questions from yesterday.  Please call back.

## 2012-09-26 ENCOUNTER — Ambulatory Visit (INDEPENDENT_AMBULATORY_CARE_PROVIDER_SITE_OTHER): Payer: Medicare Other | Admitting: *Deleted

## 2012-09-26 DIAGNOSIS — I4891 Unspecified atrial fibrillation: Secondary | ICD-10-CM | POA: Diagnosis not present

## 2012-09-26 DIAGNOSIS — Z7901 Long term (current) use of anticoagulants: Secondary | ICD-10-CM

## 2012-09-26 DIAGNOSIS — M25559 Pain in unspecified hip: Secondary | ICD-10-CM | POA: Diagnosis not present

## 2012-09-26 DIAGNOSIS — I482 Chronic atrial fibrillation, unspecified: Secondary | ICD-10-CM

## 2012-09-26 LAB — POCT INR: INR: 2.2

## 2012-10-05 ENCOUNTER — Ambulatory Visit (INDEPENDENT_AMBULATORY_CARE_PROVIDER_SITE_OTHER): Payer: Medicare Other

## 2012-10-05 DIAGNOSIS — I482 Chronic atrial fibrillation, unspecified: Secondary | ICD-10-CM

## 2012-10-05 DIAGNOSIS — Z7901 Long term (current) use of anticoagulants: Secondary | ICD-10-CM

## 2012-10-05 DIAGNOSIS — I4891 Unspecified atrial fibrillation: Secondary | ICD-10-CM | POA: Diagnosis not present

## 2012-10-05 LAB — POCT INR: INR: 1.7

## 2012-10-24 ENCOUNTER — Other Ambulatory Visit: Payer: Self-pay | Admitting: *Deleted

## 2012-10-24 MED ORDER — WARFARIN SODIUM 5 MG PO TABS: ORAL_TABLET | ORAL | Status: DC

## 2012-10-30 ENCOUNTER — Ambulatory Visit (INDEPENDENT_AMBULATORY_CARE_PROVIDER_SITE_OTHER): Payer: Medicare Other

## 2012-10-30 DIAGNOSIS — I482 Chronic atrial fibrillation, unspecified: Secondary | ICD-10-CM

## 2012-10-30 DIAGNOSIS — I4891 Unspecified atrial fibrillation: Secondary | ICD-10-CM | POA: Diagnosis not present

## 2012-10-30 DIAGNOSIS — Z7901 Long term (current) use of anticoagulants: Secondary | ICD-10-CM | POA: Diagnosis not present

## 2012-11-05 DIAGNOSIS — M5137 Other intervertebral disc degeneration, lumbosacral region: Secondary | ICD-10-CM | POA: Diagnosis not present

## 2012-11-23 DIAGNOSIS — M5137 Other intervertebral disc degeneration, lumbosacral region: Secondary | ICD-10-CM | POA: Diagnosis not present

## 2012-12-06 ENCOUNTER — Ambulatory Visit (INDEPENDENT_AMBULATORY_CARE_PROVIDER_SITE_OTHER): Payer: Medicare Other

## 2012-12-06 DIAGNOSIS — Z7901 Long term (current) use of anticoagulants: Secondary | ICD-10-CM

## 2012-12-06 DIAGNOSIS — I4891 Unspecified atrial fibrillation: Secondary | ICD-10-CM | POA: Diagnosis not present

## 2012-12-06 DIAGNOSIS — I482 Chronic atrial fibrillation, unspecified: Secondary | ICD-10-CM

## 2012-12-06 LAB — POCT INR: INR: 2

## 2012-12-12 ENCOUNTER — Telehealth: Payer: Self-pay | Admitting: Cardiology

## 2012-12-12 NOTE — Telephone Encounter (Signed)
Spoke to patient she stated she has a cold.Advised ok to take mucinex with no decongestant.

## 2012-12-12 NOTE — Telephone Encounter (Signed)
Pt needs to know which mucinex to take she has a cold

## 2012-12-13 ENCOUNTER — Telehealth: Payer: Self-pay | Admitting: Cardiology

## 2012-12-13 NOTE — Telephone Encounter (Signed)
Spoke to patient was told ok to take mucous relief dm.States she spoke to pharmacist at [facility] and it does not have decongestant.

## 2012-12-13 NOTE — Telephone Encounter (Signed)
Pt wants to know if ok to take mucus relief dm max with the meds she takes for her heart, pls advise [phone]

## 2012-12-17 ENCOUNTER — Other Ambulatory Visit: Payer: Self-pay | Admitting: Obstetrics and Gynecology

## 2012-12-17 DIAGNOSIS — M8580 Other specified disorders of bone density and structure, unspecified site: Secondary | ICD-10-CM

## 2012-12-26 ENCOUNTER — Ambulatory Visit
Admission: RE | Admit: 2012-12-26 | Discharge: 2012-12-26 | Disposition: A | Discharge status: Home / Self Care | Payer: Medicare Other | Source: Ambulatory Visit | Attending: Obstetrics and Gynecology | Admitting: Obstetrics and Gynecology

## 2012-12-26 ENCOUNTER — Other Ambulatory Visit: Payer: Medicare Other

## 2013-01-10 ENCOUNTER — Ambulatory Visit (INDEPENDENT_AMBULATORY_CARE_PROVIDER_SITE_OTHER): Payer: Medicare Other | Admitting: *Deleted

## 2013-01-10 DIAGNOSIS — Z7901 Long term (current) use of anticoagulants: Secondary | ICD-10-CM | POA: Diagnosis not present

## 2013-01-10 DIAGNOSIS — I482 Chronic atrial fibrillation, unspecified: Secondary | ICD-10-CM

## 2013-01-10 DIAGNOSIS — I4891 Unspecified atrial fibrillation: Secondary | ICD-10-CM

## 2013-01-10 LAB — POCT INR: INR: 2.4

## 2013-01-14 DIAGNOSIS — M2040 Other hammer toe(s) (acquired), unspecified foot: Secondary | ICD-10-CM | POA: Diagnosis not present

## 2013-01-17 ENCOUNTER — Other Ambulatory Visit: Payer: Self-pay | Admitting: Orthopedic Surgery

## 2013-02-18 DIAGNOSIS — Z79899 Other long term (current) drug therapy: Secondary | ICD-10-CM | POA: Diagnosis not present

## 2013-02-18 DIAGNOSIS — E559 Vitamin D deficiency, unspecified: Secondary | ICD-10-CM | POA: Diagnosis not present

## 2013-02-18 DIAGNOSIS — I119 Hypertensive heart disease without heart failure: Secondary | ICD-10-CM | POA: Diagnosis not present

## 2013-02-18 DIAGNOSIS — E785 Hyperlipidemia, unspecified: Secondary | ICD-10-CM | POA: Diagnosis not present

## 2013-02-18 DIAGNOSIS — I10 Essential (primary) hypertension: Secondary | ICD-10-CM | POA: Diagnosis not present

## 2013-02-20 ENCOUNTER — Other Ambulatory Visit: Payer: Self-pay

## 2013-02-20 MED ORDER — DILTIAZEM HCL ER COATED BEADS 120 MG PO CP24: 120.0000 mg | ORAL_CAPSULE | Freq: Every day | ORAL | Status: DC

## 2013-02-21 ENCOUNTER — Ambulatory Visit (INDEPENDENT_AMBULATORY_CARE_PROVIDER_SITE_OTHER): Payer: Medicare Other | Admitting: *Deleted

## 2013-02-21 ENCOUNTER — Encounter: Payer: Self-pay | Admitting: Cardiology

## 2013-02-21 ENCOUNTER — Ambulatory Visit (INDEPENDENT_AMBULATORY_CARE_PROVIDER_SITE_OTHER): Payer: Medicare Other | Admitting: Cardiology

## 2013-02-21 ENCOUNTER — Encounter (INDEPENDENT_AMBULATORY_CARE_PROVIDER_SITE_OTHER): Payer: Medicare Other

## 2013-02-21 VITALS — BP 144/82 | HR 87 | Ht 65.0 in | Wt 160.8 lb

## 2013-02-21 DIAGNOSIS — M79609 Pain in unspecified limb: Secondary | ICD-10-CM

## 2013-02-21 DIAGNOSIS — I2789 Other specified pulmonary heart diseases: Secondary | ICD-10-CM | POA: Diagnosis not present

## 2013-02-21 DIAGNOSIS — I059 Rheumatic mitral valve disease, unspecified: Secondary | ICD-10-CM | POA: Diagnosis not present

## 2013-02-21 DIAGNOSIS — I340 Nonrheumatic mitral (valve) insufficiency: Secondary | ICD-10-CM

## 2013-02-21 DIAGNOSIS — E7800 Pure hypercholesterolemia, unspecified: Secondary | ICD-10-CM

## 2013-02-21 DIAGNOSIS — Z7901 Long term (current) use of anticoagulants: Secondary | ICD-10-CM

## 2013-02-21 DIAGNOSIS — M79672 Pain in left foot: Secondary | ICD-10-CM | POA: Insufficient documentation

## 2013-02-21 DIAGNOSIS — I4891 Unspecified atrial fibrillation: Secondary | ICD-10-CM

## 2013-02-21 DIAGNOSIS — I739 Peripheral vascular disease, unspecified: Secondary | ICD-10-CM | POA: Diagnosis not present

## 2013-02-21 DIAGNOSIS — I272 Pulmonary hypertension, unspecified: Secondary | ICD-10-CM

## 2013-02-21 DIAGNOSIS — I482 Chronic atrial fibrillation, unspecified: Secondary | ICD-10-CM

## 2013-02-21 LAB — POCT INR: INR: 1.8

## 2013-02-21 NOTE — Patient Instructions (Signed)
When Dr Simonne Come tells you to restart your coumadin take an extra 1.=/2 tablet with your dose for 2 days

## 2013-02-21 NOTE — Patient Instructions (Signed)
Continue your therapy  We will schedule you for an Echocardiogram and lower extremity doppler  We will get a copy of your lab work from Dr. Renae Gloss  I will see you in 6 months.

## 2013-02-21 NOTE — Progress Notes (Signed)
[name] Date of Birth: 1932/04/09   History of Present Illness: [name] is seen today for followup. She states she tripped one month ago as she was getting out of bed. Since then she has felt some discomfort in her right upper chest in her chest her for her to take a deep breath. She does have some pain and numbness in her left foot. She's concerned about her circulation. She has been evaluated by orthopedics in the past and apparently has some compression of L4-5. She has no significant increase shortness of breath or edema. She denies any chest pain. She states that she wants all her arteries checked.  Current Outpatient Prescriptions on File Prior to Visit  Medication Sig Dispense Refill  . Ascorbic Acid (VITAMIN C) 1000 MG tablet Take 1,000 mg by mouth daily.        Marland Kitchen atorvastatin (LIPITOR) 20 MG tablet Take 1 tablet by mouth Daily.      . Biotin 1000 MCG tablet Take 1,000 mcg by mouth daily.        . Calcium Carbonate-Vitamin D (CALCIUM + D PO) Take by mouth daily.        . Cholecalciferol (VITAMIN D PO) Take by mouth daily.        . Cyanocobalamin (VITAMIN B-12 PO) Take by mouth as directed.      . diltiazem (CARDIZEM CD) 120 MG 24 hr capsule Take 1 capsule (120 mg total) by mouth daily.  30 capsule  5  . estrogens, conjugated, (PREMARIN) 1.25 MG tablet Take 1.25 mg by mouth daily.        . furosemide (LASIX) 40 MG tablet Take 2 tablets in the am and 1 tablet in the pm  90 tablet  6  . gabapentin (NEURONTIN) 100 MG capsule Take 1 tablet by mouth Daily.      Marland Kitchen HYDROcodone-acetaminophen (NORCO) 10-325 MG per tablet Take 1 tablet by mouth every 6 (six) hours as needed.        Marland Kitchen lisinopril (PRINIVIL,ZESTRIL) 20 MG tablet Take 1 tablet by mouth Daily.      . Magnesium 400 MG CAPS Take by mouth daily.        . Multiple Vitamins-Minerals (ZINC PO) Take by mouth daily.        Marland Kitchen PARoxetine (PAXIL-CR) 37.5 MG 24 hr tablet Take 37.5 mg by mouth every morning.        .  Potassium (POTASSIMIN PO) Take 595 mg by mouth daily. 3 TABLETS DAILY      . valACYclovir (VALTREX) 500 MG tablet       . vitamin A 8000 UNIT capsule Take 8,000 Units by mouth daily.        Marland Kitchen warfarin (COUMADIN) 5 MG tablet Take as directed by coumadin clinic  30 tablet  3  . [DISCONTINUED] diltiazem (CARDIZEM) 120 MG tablet Take 1 tablet (120 mg total) by mouth daily.  30 tablet  5   No current facility-administered medications on file prior to visit.    Allergies  Allergen Reactions  . Codeine   . Penicillins   . Sulfonamide Derivatives     Past Medical History  Diagnosis Date  . Mitral insufficiency     CHRONIC  . Hypertensive heart disease     WITH LVH  . LVH (left ventricular hypertrophy) due to hypertensive disease   . Chronic atrial fibrillation   . Pulmonary hypertension   . Hypercholesterolemia   . Back pain, chronic   . Depression   .  Coronary artery disease   . Blood transfusion     Past Surgical History  Procedure Laterality Date  . Total hip arthroplasty      BILATERAL---  LEFT 4/98;   RIGHT  11/98  . Abdominal hysterectomy    . Varus osteotomy proximal phalanx, left great toe/ paring down the callus , second left toe  10-27-2008  . Repair right second claw toe/ varus medial closing wedge osteotomy proximal phalanx right great toe  05-05-2005  . Anterior repair/ sparc pubovaginal sling  05-03-2002  . Pars plana vitrectomy w/ repair of macular hole  02-04-2002    LEFT EYE  . Cardiac catheterization  02-03-2009  DR  [place]    SINGLE-VESSEL OBSTRUCTIVE CAD, SMALL DIAGONAL BRANCH/ NORMAL LVF/ SEVERE MITRAL INSUFFICIENCY/ SEVERE PULMONARY HYPERTENSION  . Cataract extraction w/ intraocular lens  implant, bilateral  2003  . Hammer toe surgery  02/28/2012    Procedure: HAMMER TOE CORRECTION;  Surgeon: Drucilla Schmidt, MD;  Location: [facility];  Service: Orthopedics;  Laterality: Right;  FUSION OF PROXIMAL INTERPHALANGEAL  RIGHT THIRD CLAW TOE     History  Smoking status  . Never Smoker   Smokeless tobacco  . Never Used    History  Alcohol Use No    Family History  Problem Relation Age of Onset  . Heart disease Mother 30  . Prostate cancer Father 34    Review of Systems:  As noted in history of present illness. She is scheduled for outpatient surgery on her toe for a hammertoe. All other systems were reviewed and are negative.  Physical Exam: BP 144/82  Pulse 87  Ht 5\' 5"  (1.651 m)  Wt 160 lb 12.8 oz (72.938 kg)  BMI 26.76 kg/m2  SpO2 97% She is a pleasant white female in no acute distress. Her HEENT exam is unremarkable. Sclera are clear. Oropharynx is clear. Neck is supple without JVD, adenopathy, thyromegaly, or bruits. Lungs are clear. Cardiac exam reveals a regular rate and rhythm without gallop. There is a grade 2/6 systolic murmur heard best at the left sternal border. Abdomen is soft and nontender. She has no significant edema. Pedal pulses are palpable. She is alert and oriented x3. Cranial nerves II through XII are intact.  LABORATORY DATA:   Assessment / Plan: 1. Atrial fibrillation, permanent. Rate is well controlled on diltiazem. She is anticoagulated on Coumadin. We'll check INR today.  2. Mitral insufficiency, moderate. Last echo in 2010. We will update that at this time.  3. Hypertension with hypertensive heart disease, well controlled.  4. Hyperlipidemia on chronic Coumadin therapy. Her last lipid panel showed excellent control. She had recent lab with her primary care we will obtain a copy.  5. Left foot pain and numbness. I suspect this is more neuropathic. We will schedule her for lower extremity arterial Dopplers.  6. Coronary disease. Cardiac catheterization in 2010 showed severe stenosis in a small diagonal branch. She otherwise had no significant disease. She is asymptomatic. I do not feel that we need to do further evaluation at this time.

## 2013-02-26 ENCOUNTER — Encounter (HOSPITAL_BASED_OUTPATIENT_CLINIC_OR_DEPARTMENT_OTHER): Payer: Self-pay | Admitting: *Deleted

## 2013-02-27 ENCOUNTER — Encounter (HOSPITAL_BASED_OUTPATIENT_CLINIC_OR_DEPARTMENT_OTHER): Payer: Self-pay | Admitting: *Deleted

## 2013-02-27 ENCOUNTER — Ambulatory Visit (HOSPITAL_COMMUNITY): Payer: Medicare Other | Attending: Cardiovascular Disease

## 2013-02-27 DIAGNOSIS — I4891 Unspecified atrial fibrillation: Secondary | ICD-10-CM | POA: Diagnosis not present

## 2013-02-27 DIAGNOSIS — I2789 Other specified pulmonary heart diseases: Secondary | ICD-10-CM | POA: Insufficient documentation

## 2013-02-27 DIAGNOSIS — I340 Nonrheumatic mitral (valve) insufficiency: Secondary | ICD-10-CM

## 2013-02-27 DIAGNOSIS — I482 Chronic atrial fibrillation, unspecified: Secondary | ICD-10-CM

## 2013-02-27 DIAGNOSIS — I059 Rheumatic mitral valve disease, unspecified: Secondary | ICD-10-CM | POA: Insufficient documentation

## 2013-02-27 DIAGNOSIS — I272 Pulmonary hypertension, unspecified: Secondary | ICD-10-CM

## 2013-02-27 DIAGNOSIS — E7800 Pure hypercholesterolemia, unspecified: Secondary | ICD-10-CM

## 2013-02-27 DIAGNOSIS — Z7901 Long term (current) use of anticoagulants: Secondary | ICD-10-CM

## 2013-02-27 DIAGNOSIS — M79672 Pain in left foot: Secondary | ICD-10-CM

## 2013-02-27 NOTE — Progress Notes (Signed)
Pt instructed npo p mn 2/24 x cardiazem, paxil, potassium, neurontin w sip of water.  To [facility] 2/25 @ 1030.  Needs istat, PT/INR on arrival.  Lovn, ekg, 2D ECHO in epic.

## 2013-02-27 NOTE — Progress Notes (Signed)
Echocardiogram performed.  

## 2013-02-28 ENCOUNTER — Telehealth: Payer: Self-pay | Admitting: *Deleted

## 2013-02-28 NOTE — Telephone Encounter (Signed)
Message copied by Carmela Hurt on Thu Feb 28, 2013  8:41 AM ------      Message from: Carmela Hurt      Created: Thu Feb 21, 2013  9:41 AM      Regarding: surgery on  03/01/2013       Dr [place], Just wanted to make sure you were aware of this:            Right foot surgery, bunion and hammer toe repair, surgery 03/01/2013, last dose of coumadin is Saturday 02/23/2013 (holding for 5 days), per Dr Leslee Home             Thanks, Addison Lank, RN ------

## 2013-03-01 ENCOUNTER — Encounter (HOSPITAL_BASED_OUTPATIENT_CLINIC_OR_DEPARTMENT_OTHER): Payer: Self-pay | Admitting: Anesthesiology

## 2013-03-01 ENCOUNTER — Ambulatory Visit (HOSPITAL_BASED_OUTPATIENT_CLINIC_OR_DEPARTMENT_OTHER)
Admission: RE | Admit: 2013-03-01 | Discharge: 2013-03-01 | Disposition: A | Discharge status: Home / Self Care | Payer: Medicare Other | Source: Ambulatory Visit | Attending: Orthopedic Surgery | Admitting: Orthopedic Surgery

## 2013-03-01 ENCOUNTER — Encounter (HOSPITAL_BASED_OUTPATIENT_CLINIC_OR_DEPARTMENT_OTHER)
Admission: RE | Disposition: A | Discharge status: Home / Self Care | Payer: Self-pay | Source: Ambulatory Visit | Attending: Orthopedic Surgery

## 2013-03-01 ENCOUNTER — Ambulatory Visit (HOSPITAL_BASED_OUTPATIENT_CLINIC_OR_DEPARTMENT_OTHER): Payer: Medicare Other | Admitting: Anesthesiology

## 2013-03-01 DIAGNOSIS — Z882 Allergy status to sulfonamides status: Secondary | ICD-10-CM | POA: Insufficient documentation

## 2013-03-01 DIAGNOSIS — I2789 Other specified pulmonary heart diseases: Secondary | ICD-10-CM | POA: Diagnosis not present

## 2013-03-01 DIAGNOSIS — I519 Heart disease, unspecified: Secondary | ICD-10-CM | POA: Insufficient documentation

## 2013-03-01 DIAGNOSIS — I2510 Atherosclerotic heart disease of native coronary artery without angina pectoris: Secondary | ICD-10-CM | POA: Insufficient documentation

## 2013-03-01 DIAGNOSIS — F329 Major depressive disorder, single episode, unspecified: Secondary | ICD-10-CM | POA: Insufficient documentation

## 2013-03-01 DIAGNOSIS — I4891 Unspecified atrial fibrillation: Secondary | ICD-10-CM | POA: Insufficient documentation

## 2013-03-01 DIAGNOSIS — M205X9 Other deformities of toe(s) (acquired), unspecified foot: Secondary | ICD-10-CM | POA: Diagnosis not present

## 2013-03-01 DIAGNOSIS — M21619 Bunion of unspecified foot: Secondary | ICD-10-CM | POA: Diagnosis not present

## 2013-03-01 DIAGNOSIS — Z79899 Other long term (current) drug therapy: Secondary | ICD-10-CM | POA: Insufficient documentation

## 2013-03-01 DIAGNOSIS — Z885 Allergy status to narcotic agent status: Secondary | ICD-10-CM | POA: Insufficient documentation

## 2013-03-01 DIAGNOSIS — Z7901 Long term (current) use of anticoagulants: Secondary | ICD-10-CM | POA: Diagnosis not present

## 2013-03-01 DIAGNOSIS — Z880 Allergy status to penicillin: Secondary | ICD-10-CM | POA: Insufficient documentation

## 2013-03-01 DIAGNOSIS — I119 Hypertensive heart disease without heart failure: Secondary | ICD-10-CM | POA: Insufficient documentation

## 2013-03-01 DIAGNOSIS — M2040 Other hammer toe(s) (acquired), unspecified foot: Secondary | ICD-10-CM | POA: Diagnosis not present

## 2013-03-01 DIAGNOSIS — D649 Anemia, unspecified: Secondary | ICD-10-CM | POA: Insufficient documentation

## 2013-03-01 DIAGNOSIS — M205X1 Other deformities of toe(s) (acquired), right foot: Secondary | ICD-10-CM

## 2013-03-01 DIAGNOSIS — F3289 Other specified depressive episodes: Secondary | ICD-10-CM | POA: Insufficient documentation

## 2013-03-01 DIAGNOSIS — E7800 Pure hypercholesterolemia, unspecified: Secondary | ICD-10-CM | POA: Insufficient documentation

## 2013-03-01 HISTORY — DX: Long term (current) use of anticoagulants: Z79.01

## 2013-03-01 HISTORY — DX: Essential (primary) hypertension: I10

## 2013-03-01 HISTORY — DX: Other hammer toe(s) (acquired), unspecified foot: M20.40

## 2013-03-01 HISTORY — PX: BUNIONECTOMY WITH HAMMERTOE RECONSTRUCTION: SHX5600

## 2013-03-01 HISTORY — DX: Personal history of other medical treatment: Z92.89

## 2013-03-01 HISTORY — DX: Unspecified osteoarthritis, unspecified site: M19.90

## 2013-03-01 HISTORY — DX: Anemia, unspecified: D64.9

## 2013-03-01 LAB — POCT I-STAT, CHEM 8
Calcium, Ion: 1.06 mmol/L — ABNORMAL LOW (ref 1.13–1.30)
Chloride: 104 mEq/L (ref 96–112)
Creatinine, Ser: 0.9 mg/dL (ref 0.50–1.10)
Glucose, Bld: 102 mg/dL — ABNORMAL HIGH (ref 70–99)
Hemoglobin: 12.2 g/dL (ref 12.0–15.0)
TCO2: 31 mmol/L (ref 0–100)

## 2013-03-01 SURGERY — BUNIONECTOMY, WITH HAMMER TOE CORRECTION
Anesthesia: General | Site: Foot | Laterality: Right | Wound class: Clean

## 2013-03-01 MED ORDER — VANCOMYCIN HCL 1000 MG IV SOLR
1000.0000 mg | INTRAVENOUS | Status: DC | PRN
  Administered 2013-03-01: 1000 mg via INTRAVENOUS

## 2013-03-01 MED ORDER — HYDROCODONE-ACETAMINOPHEN 7.5-325 MG PO TABS: 1.0000 | ORAL_TABLET | Freq: Four times a day (QID) | ORAL | Status: DC | PRN

## 2013-03-01 MED ORDER — EPHEDRINE SULFATE 50 MG/ML IJ SOLN
INTRAMUSCULAR | Status: DC | PRN
  Administered 2013-03-01 (×2): 10 mg via INTRAVENOUS

## 2013-03-01 MED ORDER — DEXAMETHASONE SODIUM PHOSPHATE 4 MG/ML IJ SOLN
INTRAMUSCULAR | Status: DC | PRN
  Administered 2013-03-01: 10 mg via INTRAVENOUS

## 2013-03-01 MED ORDER — POVIDONE-IODINE 7.5 % EX SOLN
Freq: Once | CUTANEOUS | Status: DC
  Filled 2013-03-01: qty 118

## 2013-03-01 MED ORDER — FENTANYL CITRATE 0.05 MG/ML IJ SOLN
INTRAMUSCULAR | Status: DC | PRN
  Administered 2013-03-01: 50 ug via INTRAVENOUS
  Administered 2013-03-01 (×2): 25 ug via INTRAVENOUS

## 2013-03-01 MED ORDER — BUPIVACAINE HCL (PF) 0.25 % IJ SOLN
INTRAMUSCULAR | Status: DC | PRN
  Administered 2013-03-01: 4 mL

## 2013-03-01 MED ORDER — FENTANYL CITRATE 0.05 MG/ML IJ SOLN
25.0000 ug | INTRAMUSCULAR | Status: DC | PRN
  Filled 2013-03-01: qty 1

## 2013-03-01 MED ORDER — 0.9 % SODIUM CHLORIDE (POUR BTL) OPTIME
TOPICAL | Status: DC | PRN
  Administered 2013-03-01: 1000 mL

## 2013-03-01 MED ORDER — PROMETHAZINE HCL 25 MG/ML IJ SOLN
6.2500 mg | INTRAMUSCULAR | Status: DC | PRN
  Filled 2013-03-01: qty 1

## 2013-03-01 MED ORDER — LIDOCAINE HCL (CARDIAC) 20 MG/ML IV SOLN
INTRAVENOUS | Status: DC | PRN
  Administered 2013-03-01: 70 mg via INTRAVENOUS

## 2013-03-01 MED ORDER — PROPOFOL 10 MG/ML IV BOLUS
INTRAVENOUS | Status: DC | PRN
  Administered 2013-03-01: 120 mg via INTRAVENOUS
  Administered 2013-03-01: 20 mg via INTRAVENOUS

## 2013-03-01 MED ORDER — LACTATED RINGERS IV SOLN
INTRAVENOUS | Status: DC
  Administered 2013-03-01 (×2): via INTRAVENOUS
  Filled 2013-03-01: qty 1000

## 2013-03-01 SURGICAL SUPPLY — 31 items
BANDAGE CONFORM 3  STR LF (GAUZE/BANDAGES/DRESSINGS) ×2 IMPLANT
BANDAGE ELASTIC 4 VELCRO ST LF (GAUZE/BANDAGES/DRESSINGS) ×2 IMPLANT
CAUTERY EYE LOW TEMP 1300F FIN (OPHTHALMIC RELATED) IMPLANT
DRAPE LG THREE QUARTER DISP (DRAPES) ×2 IMPLANT
DRAPE PED LAPAROTOMY (DRAPES) ×2 IMPLANT
DRAPE STERI 35X30 U-POUCH (DRAPES) ×2 IMPLANT
DRSG EMULSION OIL 3X3 NADH (GAUZE/BANDAGES/DRESSINGS) ×2 IMPLANT
ELECT REM PT RETURN 9FT ADLT (ELECTROSURGICAL) ×2
ELECTRODE REM PT RTRN 9FT ADLT (ELECTROSURGICAL) ×1 IMPLANT
GAUZE SPONGE 4X4 12PLY STRL LF (GAUZE/BANDAGES/DRESSINGS) ×4 IMPLANT
GAUZE SPONGE 4X4 16PLY XRAY LF (GAUZE/BANDAGES/DRESSINGS) ×2 IMPLANT
GLOVE BIO SURGEON STRL SZ 6 (GLOVE) ×2 IMPLANT
GLOVE BIO SURGEON STRL SZ 6.5 (GLOVE) ×2 IMPLANT
GLOVE BIO SURGEON STRL SZ8 (GLOVE) ×2 IMPLANT
GLOVE ECLIPSE 8.0 STRL XLNG CF (GLOVE) ×2 IMPLANT
GLOVE INDICATOR 6.5 STRL GRN (GLOVE) ×2 IMPLANT
GLOVE INDICATOR 7.0 STRL GRN (GLOVE) ×2 IMPLANT
GOWN BRE IMP SLV SIRUS LXLNG (GOWN DISPOSABLE) IMPLANT
GOWN SRG XL XLNG 56XLVL 4 (GOWN DISPOSABLE) IMPLANT
GOWN STRL NON-REIN LRG LVL3 (GOWN DISPOSABLE) ×4 IMPLANT
GOWN STRL NON-REIN XL XLG LVL4 (GOWN DISPOSABLE)
GOWN STRL REIN XL XLG (GOWN DISPOSABLE) ×2 IMPLANT
K-WIRE CAPS STERILE WHITE .045 (WIRE) ×2 IMPLANT
PAD CAST 4YDX4 CTTN HI CHSV (CAST SUPPLIES) ×2 IMPLANT
PADDING CAST ABS 4INX4YD NS (CAST SUPPLIES) ×1
PADDING CAST ABS COTTON 4X4 ST (CAST SUPPLIES) ×1 IMPLANT
PADDING CAST COTTON 4X4 STRL (CAST SUPPLIES) ×4
SUT ETHILON 3 0 PS 1 (SUTURE) ×2 IMPLANT
SUT VIC AB 3-0 PS1 18 (SUTURE) ×2
SUT VIC AB 3-0 PS1 18XBRD (SUTURE) ×1 IMPLANT
TOWEL OR 17X26 4PK STRL BLUE (TOWEL DISPOSABLE) ×2 IMPLANT

## 2013-03-01 NOTE — Anesthesia Procedure Notes (Signed)
Procedure Name: LMA Insertion Date/Time: 03/01/2013 1:53 PM Performed by: Norva Pavlov Pre-anesthesia Checklist: Patient identified, Emergency Drugs available, Suction available and Patient being monitored Patient Re-evaluated:Patient Re-evaluated prior to inductionOxygen Delivery Method: Circle System Utilized Preoxygenation: Pre-oxygenation with 100% oxygen Intubation Type: IV induction Ventilation: Mask ventilation without difficulty LMA: LMA inserted LMA Size: 4.0 Number of attempts: 1 Airway Equipment and Method: bite block Placement Confirmation: positive ETCO2 Tube secured with: Tape Dental Injury: Teeth and Oropharynx as per pre-operative assessment

## 2013-03-01 NOTE — Anesthesia Preprocedure Evaluation (Signed)
Anesthesia Evaluation  Patient identified by MRN, date of birth, ID band Patient awake    Reviewed: Allergy & Precautions, H&P , NPO status , Patient's Chart, lab work & pertinent test results  Airway Mallampati: II TM Distance: >3 FB Neck ROM: Full    Dental no notable dental hx.    Pulmonary neg pulmonary ROS,  breath sounds clear to auscultation  Pulmonary exam normal       Cardiovascular Exercise Tolerance: Good hypertension, Pt. on medications + CAD + dysrhythmias Atrial Fibrillation Rhythm:Regular Rate:Normal  Office visit Dr. [place] April 2014 reviewed.   ECHO results reviewed. EF normal. Moderate pulmonary hypertension. Moderate mitral regurgitation.   Neuro/Psych PSYCHIATRIC DISORDERS Depression negative neurological ROS     GI/Hepatic negative GI ROS, Neg liver ROS,   Endo/Other  negative endocrine ROS  Renal/GU negative Renal ROS  negative genitourinary   Musculoskeletal negative musculoskeletal ROS (+)   Abdominal   Peds negative pediatric ROS (+)  Hematology negative hematology ROS (+)   Anesthesia Other Findings   Reproductive/Obstetrics negative OB ROS                           Anesthesia Physical Anesthesia Plan  ASA: IV  Anesthesia Plan: General   Post-op Pain Management:    Induction: Intravenous  Airway Management Planned: LMA  Additional Equipment:   Intra-op Plan:   Post-operative Plan: Extubation in OR  Informed Consent: I have reviewed the patients History and Physical, chart, labs and discussed the procedure including the risks, benefits and alternatives for the proposed anesthesia with the patient or authorized representative who has indicated his/her understanding and acceptance.   Dental advisory given  Plan Discussed with: CRNA  Anesthesia Plan Comments: (Did fine with GA for foot procedure April 2013.)        Anesthesia Quick  Evaluation

## 2013-03-01 NOTE — H&P (Signed)
[name] is an 79 y.o. female.   Chief Complaint: painful rt foot HPI: swollen and tenderbunionett;fourth toe claws with varus angulation  Past Medical History  Diagnosis Date  . Mitral insufficiency     CHRONIC  . Hypertensive heart disease     WITH LVH  . LVH (left ventricular hypertrophy) due to hypertensive disease   . Chronic atrial fibrillation   . Pulmonary hypertension   . Hypercholesterolemia   . Back pain, chronic   . Depression   . Coronary artery disease CARDIOLOGIST- DR [place]--  LOV IN EPIC  . Anticoagulated on Coumadin     CHRONIC  . Arthritis   . Anemia   . Hypertension   . Hammer toe   . Transfusion history     Past Surgical History  Procedure Laterality Date  . Total hip arthroplasty      BILATERAL---  LEFT 4/98;   RIGHT  11/98  . Abdominal hysterectomy    . Varus osteotomy proximal phalanx, left great toe/ paring down the callus , second left toe  10-27-2008  . Repair right second claw toe/ varus medial closing wedge osteotomy proximal phalanx right great toe  05-05-2005  . Anterior repair/ sparc pubovaginal sling  05-03-2002  . Pars plana vitrectomy w/ repair of macular hole  02-04-2002    LEFT EYE  . Cardiac catheterization  02-03-2009  DR  [place]    SINGLE-VESSEL OBSTRUCTIVE CAD, SMALL DIAGONAL BRANCH/ NORMAL LVF/ SEVERE MITRAL INSUFFICIENCY/ SEVERE PULMONARY HYPERTENSION  . Cataract extraction w/ intraocular lens  implant, bilateral  2003  . Hammer toe surgery  02/28/2012    Procedure: HAMMER TOE CORRECTION;  Surgeon: Drucilla Schmidt, MD;  Location: [facility];  Service: Orthopedics;  Laterality: Right;  FUSION OF PROXIMAL INTERPHALANGEAL  RIGHT THIRD CLAW TOE    Family History  Problem Relation Age of Onset  . Heart disease Mother 30  . Prostate cancer Father 56   Social History:  reports that she has never smoked. She has never used smokeless tobacco. She reports that  drinks alcohol. She reports that she does not  use illicit drugs.  Allergies:  Allergies  Allergen Reactions  . Codeine Nausea And Vomiting  . Penicillins   . Sulfonamide Derivatives Rash    Medications Prior to Admission  Medication Sig Dispense Refill  . Ascorbic Acid (VITAMIN C) 1000 MG tablet Take 1,000 mg by mouth daily.        Marland Kitchen atorvastatin (LIPITOR) 20 MG tablet Take 1 tablet by mouth Daily.      . Biotin 1000 MCG tablet Take 1,000 mcg by mouth daily.        . Calcium Carbonate-Vitamin D (CALCIUM + D PO) Take by mouth daily.        . Cholecalciferol (VITAMIN D PO) Take by mouth daily.        . Cyanocobalamin (VITAMIN B-12 PO) Take by mouth as directed.      . diltiazem (CARDIZEM CD) 120 MG 24 hr capsule Take 1 capsule (120 mg total) by mouth daily.  30 capsule  5  . estrogens, conjugated, (PREMARIN) 1.25 MG tablet Take 1.25 mg by mouth daily.        . furosemide (LASIX) 40 MG tablet Take 2 tablets in the am and 1 tablet in the pm  90 tablet  6  . gabapentin (NEURONTIN) 100 MG capsule Take 1 tablet by mouth Daily.      Marland Kitchen HYDROcodone-acetaminophen (NORCO) 10-325 MG per tablet  Take 1 tablet by mouth every 6 (six) hours as needed.        Marland Kitchen lisinopril (PRINIVIL,ZESTRIL) 20 MG tablet Take 1 tablet by mouth Daily.      . Magnesium 400 MG CAPS Take by mouth daily.        . Multiple Vitamins-Minerals (ZINC PO) Take by mouth daily.        Marland Kitchen PARoxetine (PAXIL-CR) 37.5 MG 24 hr tablet Take 37.5 mg by mouth every morning.        . Potassium (POTASSIMIN PO) Take 595 mg by mouth daily. 3 TABLETS DAILY      . vitamin A 8000 UNIT capsule Take 8,000 Units by mouth daily.        Marland Kitchen warfarin (COUMADIN) 5 MG tablet Take as directed by coumadin clinic  30 tablet  3  . valACYclovir (VALTREX) 500 MG tablet         Results for orders placed during the hospital encounter of 03/01/13 (from the past 48 hour(s))  PROTIME-INR     Status: None   Collection Time    03/01/13 10:50 AM      Result Value Range   Prothrombin Time 13.7  11.6 - 15.2  seconds   INR 1.06  0.00 - 1.49  POCT I-STAT, CHEM 8     Status: Abnormal   Collection Time    03/01/13 11:00 AM      Result Value Range   Sodium 141  135 - 145 mEq/L   Potassium 5.0  3.5 - 5.1 mEq/L   Chloride 104  96 - 112 mEq/L   BUN 10  6 - 23 mg/dL   Creatinine, Ser 1.61  0.50 - 1.10 mg/dL   Glucose, Bld 096 (*) 70 - 99 mg/dL   Calcium, Ion 0.45 (*) 1.13 - 1.30 mmol/L   TCO2 31  0 - 100 mmol/L   Hemoglobin 12.2  12.0 - 15.0 g/dL   HCT 40.9  81.1 - 91.4 %   No results found.  ROS  Blood pressure 136/79, pulse 95, temperature 97.3 F (36.3 C), temperature source Oral, resp. rate 18, height 5\' 5"  (1.651 m), weight 72.576 kg (160 lb), SpO2 96.00%. Physical Exam  Constitutional: She is oriented to person, place, and time. She appears well-developed and well-nourished.  HENT:  Head: Normocephalic and atraumatic.  Right Ear: External ear normal.  Left Ear: External ear normal.  Nose: Nose normal.  Mouth/Throat: Oropharynx is clear and moist.  Eyes: Conjunctivae and EOM are normal. Pupils are equal, round, and reactive to light.  Neck: Normal range of motion. Neck supple.  Cardiovascular: Normal rate, regular rhythm, normal heart sounds and intact distal pulses.   Respiratory: Effort normal and breath sounds normal.  GI: Soft. Bowel sounds are normal.  Musculoskeletal: Normal range of motion.  Right foot--tender bunionette and fourth claw toe  Neurological: She is alert and oriented to person, place, and time. She has normal reflexes.  Skin: Skin is warm and dry.  Psychiatric: She has a normal mood and affect. Her behavior is normal. Judgment and thought content normal.     Assessment/Plan Painful bunionette and fourth claw toe rt foot Excision bunionette and correction claw toe 4  [name] P 03/01/2013, 1:34 PM

## 2013-03-01 NOTE — Transfer of Care (Signed)
Immediate Anesthesia Transfer of Care Note  Patient: [name]  Procedure(s) Performed: Procedure(s) (LRB): RIGHT FOOT EXCISION BUNIONETTE AND FUSION OF DIP FOURTH TOE (Right)  Patient Location: PACU  Anesthesia Type: General  Level of Consciousness: awake, alert  and oriented  Airway & Oxygen Therapy: Patient Spontanous Breathing and Patient connected to face mask oxygen  Post-op Assessment: Report given to PACU RN and Post -op Vital signs reviewed and stable  Post vital signs: Reviewed and stable  Complications: No apparent anesthesia complications

## 2013-03-01 NOTE — Anesthesia Postprocedure Evaluation (Signed)
Anesthesia Post Note  Patient: [name]  Procedure(s) Performed: Procedure(s) (LRB): RIGHT FOOT EXCISION BUNIONETTE AND FUSION OF DIP FOURTH TOE (Right)  Anesthesia type: General  Patient location: PACU  Post pain: Pain level controlled  Post assessment: Post-op Vital signs reviewed  Last Vitals: BP 122/65  Pulse 87  Temp(Src) 37.2 C (Oral)  Resp 17  Ht 5\' 5"  (1.651 m)  Wt 160 lb (72.576 kg)  BMI 26.63 kg/m2  SpO2 95%  Post vital signs: Reviewed  Level of consciousness: sedated  Complications: No apparent anesthesia complications

## 2013-03-02 NOTE — Op Note (Signed)
[name], [name]           ACCOUNT NO.:  [id number]  MEDICAL RECORD NO.:  [id number]  LOCATION:  [facility]IN[facility]                         FACILITY:  [facility]  PHYSICIAN:  Marlowe Kays, M.D.  DATE OF BIRTH:  10/14/1932  DATE OF PROCEDURE:  03/01/2013 DATE OF DISCHARGE:                              OPERATIVE REPORT   PREOPERATIVE DIAGNOSES: 1. Painful bunionette. 2. Painful claw fourth toe, right foot.  POSTOPERATIVE DIAGNOSES: 1. Painful bunionette. 2. Painful claw fourth toe, right foot.  OPERATION: 1. Excision of bunionette. 2. Fusion of the distal interphalangeal joint, right fourth toe.  SURGEON:  Marlowe Kays, M.D.  ASSISTANT:  Nurse.  ANESTHESIA:  General.  PATHOLOGY AND JUSTIFICATION FOR PROCEDURE:  She has a painful bunionette bilaterally and elected to just have the right foot performed at this time.  Her fourth toe had an element of clawing but also varus posturing cutting it into the third toe with a defect primarily at the DIP joint.  DESCRIPTION OF PROCEDURE:  Satisfactory general anesthesia, pneumatic tourniquet with the right leg Esmarched out nonsterilely and tourniquet inflated to 300 mmHg, the right foot and ankle were then prepped with DuraPrep, and draped in sterile field.  She was also covered with prophylactic antibiotics because of a total hip replacement in the form of vancomycin.  Time-out performed.  I first made a dorsal lateral incision over center of the bunionette and carried the incision down through the capsule exposing the prominent bunion which I resected with rongeur.  I felt like we had completed the resection as desired.  I irrigated the wound well with sterile saline, then closed the capsule with interrupted 3-0 Vicryl, the skin with interrupted 4-0 nylon mattress sutures.  I then made a curved incision starting laterally distally just proximal to the DIP joint and then curving across the DIP joint and coming medially adjacent to the  nail.  Incision was carried down to the DIP joint which I opened vertically to expose the joint.  I then cut off the cartilaginous endplates with a small bone cutter until I could close the raw bone in a slush fashion and also correcting the varus deformity.  I then placed a 0.045 smooth K-wire from proximal to distal off the distal phalanx and then distal to proximal.  The fusion site was secure.  I then bent and cut the pin and covered it with a pin cap.  This wound was also irrigated with sterile saline and the skin and subcutaneous tissue were closed with interrupted 4-0 nylon mattress sutures.  I then infiltrated the fourth toe wound at the base of the proximal phalanx. Betadine, Adaptic dry sterile dressings were applied.  Tourniquet was released.  She tolerated the procedure well and was taken to the recovery room in satisfied condition with no complications.          ______________________________ Marlowe Kays, M.D.     JA/MEDQ  D:  03/01/2013  T:  03/02/2013  Job:  [id number]

## 2013-03-02 NOTE — Op Note (Deleted)
NAME:  [name], [name]           ACCOUNT NO.:  [id number]  MEDICAL RECORD NO.:  [id number]  LOCATION:  [facility]IN[facility]                         FACILITY:  [facility]  PHYSICIAN:  Sadye Kiernan, M.D.  DATE OF BIRTH:  07/14/1932  DATE OF PROCEDURE:  03/01/2013 DATE OF DISCHARGE:                              OPERATIVE REPORT   PREOPERATIVE DIAGNOSES: 1. Painful bunionette. 2. Painful claw fourth toe, right foot.  POSTOPERATIVE DIAGNOSES: 1. Painful bunionette. 2. Painful claw fourth toe, right foot.  OPERATION: 1. Excision of bunionette. 2. Fusion of the distal interphalangeal joint, right fourth toe.  SURGEON:  Tenna Lacko, M.D.  ASSISTANT:  Nurse.  ANESTHESIA:  General.  PATHOLOGY AND JUSTIFICATION FOR PROCEDURE:  She has a painful bunionette bilaterally and elected to just have the right foot performed at this time.  Her fourth toe had an element of clawing but also varus posturing cutting it into the third toe with a defect primarily at the DIP joint.  DESCRIPTION OF PROCEDURE:  Satisfactory general anesthesia, pneumatic tourniquet with the right leg Esmarched out nonsterilely and tourniquet inflated to 300 mmHg, the right foot and ankle were then prepped with DuraPrep, and draped in sterile field.  She was also covered with prophylactic antibiotics because of a total hip replacement in the form of vancomycin.  Time-out performed.  I first made a dorsal lateral incision over center of the bunionette and carried the incision down through the capsule exposing the prominent bunion which I resected with rongeur.  I felt like we had completed the resection as desired.  I irrigated the wound well with sterile saline, then closed the capsule with interrupted 3-0 Vicryl, the skin with interrupted 4-0 nylon mattress sutures.  I then made a curved incision starting laterally distally just proximal to the DIP joint and then curving across the DIP joint and coming medially adjacent to the  nail.  Incision was carried down to the DIP joint which I opened vertically to expose the joint.  I then cut off the cartilaginous endplates with a small bone cutter until I could close the raw bone in a slush fashion and also correcting the varus deformity.  I then placed a 0.045 smooth K-wire from proximal to distal off the distal phalanx and then distal to proximal.  The fusion site was secure.  I then bent and cut the pin and covered it with a pin cap.  This wound was also irrigated with sterile saline and the skin and subcutaneous tissue were closed with interrupted 4-0 nylon mattress sutures.  I then infiltrated the fourth toe wound at the base of the proximal phalanx. Betadine, Adaptic dry sterile dressings were applied.  Tourniquet was released.  She tolerated the procedure well and was taken to the recovery room in satisfied condition with no complications.          ______________________________ Dhruva Orndoff, M.D.     JA/MEDQ  D:  03/01/2013  T:  03/02/2013  Job:  [id number] 

## 2013-03-04 ENCOUNTER — Encounter (HOSPITAL_BASED_OUTPATIENT_CLINIC_OR_DEPARTMENT_OTHER): Payer: Self-pay | Admitting: Orthopedic Surgery

## 2013-03-04 ENCOUNTER — Telehealth: Payer: Self-pay | Admitting: Cardiology

## 2013-03-04 NOTE — Telephone Encounter (Signed)
New Prob    Pt is calling in regards to her test results. Would like to speak to nurse.

## 2013-03-04 NOTE — Telephone Encounter (Signed)
Returned call to patient echo results given. 

## 2013-03-15 ENCOUNTER — Ambulatory Visit (INDEPENDENT_AMBULATORY_CARE_PROVIDER_SITE_OTHER): Payer: Medicare Other

## 2013-03-15 DIAGNOSIS — Z7901 Long term (current) use of anticoagulants: Secondary | ICD-10-CM

## 2013-03-15 DIAGNOSIS — I4891 Unspecified atrial fibrillation: Secondary | ICD-10-CM

## 2013-03-15 DIAGNOSIS — I482 Chronic atrial fibrillation, unspecified: Secondary | ICD-10-CM

## 2013-03-15 DIAGNOSIS — Z4789 Encounter for other orthopedic aftercare: Secondary | ICD-10-CM | POA: Diagnosis not present

## 2013-03-15 LAB — POCT INR: INR: 2

## 2013-03-28 DIAGNOSIS — Z4789 Encounter for other orthopedic aftercare: Secondary | ICD-10-CM | POA: Diagnosis not present

## 2013-04-11 ENCOUNTER — Ambulatory Visit (INDEPENDENT_AMBULATORY_CARE_PROVIDER_SITE_OTHER): Payer: Medicare Other | Admitting: *Deleted

## 2013-04-11 DIAGNOSIS — I482 Chronic atrial fibrillation, unspecified: Secondary | ICD-10-CM

## 2013-04-11 DIAGNOSIS — I4891 Unspecified atrial fibrillation: Secondary | ICD-10-CM | POA: Diagnosis not present

## 2013-04-11 DIAGNOSIS — Z7901 Long term (current) use of anticoagulants: Secondary | ICD-10-CM

## 2013-04-11 DIAGNOSIS — Z01419 Encounter for gynecological examination (general) (routine) without abnormal findings: Secondary | ICD-10-CM | POA: Diagnosis not present

## 2013-04-17 DIAGNOSIS — L578 Other skin changes due to chronic exposure to nonionizing radiation: Secondary | ICD-10-CM | POA: Diagnosis not present

## 2013-04-17 DIAGNOSIS — L821 Other seborrheic keratosis: Secondary | ICD-10-CM | POA: Diagnosis not present

## 2013-04-17 DIAGNOSIS — L570 Actinic keratosis: Secondary | ICD-10-CM | POA: Diagnosis not present

## 2013-04-29 DIAGNOSIS — Z4789 Encounter for other orthopedic aftercare: Secondary | ICD-10-CM | POA: Diagnosis not present

## 2013-05-08 ENCOUNTER — Telehealth: Payer: Self-pay | Admitting: *Deleted

## 2013-05-08 NOTE — Telephone Encounter (Signed)
Patient called wanting to move her appt out 6 weeks, she has been subtherapeutic, risks of anticoagulation therapy risks explained, moved to 05/22/2013 per her request.

## 2013-05-22 ENCOUNTER — Ambulatory Visit (INDEPENDENT_AMBULATORY_CARE_PROVIDER_SITE_OTHER): Payer: Medicare Other | Admitting: *Deleted

## 2013-05-22 DIAGNOSIS — I4891 Unspecified atrial fibrillation: Secondary | ICD-10-CM

## 2013-05-22 DIAGNOSIS — Z7901 Long term (current) use of anticoagulants: Secondary | ICD-10-CM

## 2013-05-22 DIAGNOSIS — I482 Chronic atrial fibrillation, unspecified: Secondary | ICD-10-CM

## 2013-05-22 LAB — POCT INR: INR: 1.9

## 2013-05-29 DIAGNOSIS — Z4789 Encounter for other orthopedic aftercare: Secondary | ICD-10-CM | POA: Diagnosis not present

## 2013-06-17 DIAGNOSIS — R233 Spontaneous ecchymoses: Secondary | ICD-10-CM | POA: Diagnosis not present

## 2013-06-17 DIAGNOSIS — L821 Other seborrheic keratosis: Secondary | ICD-10-CM | POA: Diagnosis not present

## 2013-06-17 DIAGNOSIS — D485 Neoplasm of uncertain behavior of skin: Secondary | ICD-10-CM | POA: Diagnosis not present

## 2013-06-26 ENCOUNTER — Other Ambulatory Visit: Payer: Self-pay | Admitting: *Deleted

## 2013-06-26 MED ORDER — WARFARIN SODIUM 5 MG PO TABS: 5.0000 mg | ORAL_TABLET | ORAL | Status: DC

## 2013-06-27 ENCOUNTER — Ambulatory Visit (INDEPENDENT_AMBULATORY_CARE_PROVIDER_SITE_OTHER): Payer: Medicare Other | Admitting: *Deleted

## 2013-06-27 DIAGNOSIS — I119 Hypertensive heart disease without heart failure: Secondary | ICD-10-CM | POA: Diagnosis not present

## 2013-06-27 DIAGNOSIS — Z0000 Encounter for general adult medical examination without abnormal findings: Secondary | ICD-10-CM | POA: Diagnosis not present

## 2013-06-27 DIAGNOSIS — E785 Hyperlipidemia, unspecified: Secondary | ICD-10-CM | POA: Diagnosis not present

## 2013-06-27 DIAGNOSIS — Z7901 Long term (current) use of anticoagulants: Secondary | ICD-10-CM

## 2013-06-27 DIAGNOSIS — I4891 Unspecified atrial fibrillation: Secondary | ICD-10-CM

## 2013-06-27 DIAGNOSIS — Z79899 Other long term (current) drug therapy: Secondary | ICD-10-CM | POA: Diagnosis not present

## 2013-06-27 DIAGNOSIS — I482 Chronic atrial fibrillation, unspecified: Secondary | ICD-10-CM

## 2013-06-27 DIAGNOSIS — E559 Vitamin D deficiency, unspecified: Secondary | ICD-10-CM | POA: Diagnosis not present

## 2013-06-27 LAB — POCT INR: INR: 1.8

## 2013-06-28 ENCOUNTER — Encounter: Payer: Self-pay | Admitting: Cardiology

## 2013-07-24 ENCOUNTER — Ambulatory Visit (INDEPENDENT_AMBULATORY_CARE_PROVIDER_SITE_OTHER): Payer: Medicare Other | Admitting: *Deleted

## 2013-07-24 DIAGNOSIS — I4891 Unspecified atrial fibrillation: Secondary | ICD-10-CM

## 2013-07-24 DIAGNOSIS — Z7901 Long term (current) use of anticoagulants: Secondary | ICD-10-CM

## 2013-07-24 DIAGNOSIS — I482 Chronic atrial fibrillation, unspecified: Secondary | ICD-10-CM

## 2013-07-24 LAB — POCT INR: INR: 3.3

## 2013-08-20 ENCOUNTER — Other Ambulatory Visit: Payer: Self-pay | Admitting: Cardiology

## 2013-08-23 ENCOUNTER — Ambulatory Visit (INDEPENDENT_AMBULATORY_CARE_PROVIDER_SITE_OTHER): Payer: Medicare Other | Admitting: *Deleted

## 2013-08-23 ENCOUNTER — Encounter: Payer: Self-pay | Admitting: Cardiology

## 2013-08-23 ENCOUNTER — Ambulatory Visit (INDEPENDENT_AMBULATORY_CARE_PROVIDER_SITE_OTHER): Payer: Medicare Other | Admitting: Cardiology

## 2013-08-23 VITALS — BP 130/68 | HR 92 | Ht 65.0 in | Wt 161.8 lb

## 2013-08-23 DIAGNOSIS — Z7901 Long term (current) use of anticoagulants: Secondary | ICD-10-CM

## 2013-08-23 DIAGNOSIS — Z1231 Encounter for screening mammogram for malignant neoplasm of breast: Secondary | ICD-10-CM | POA: Diagnosis not present

## 2013-08-23 DIAGNOSIS — I482 Chronic atrial fibrillation, unspecified: Secondary | ICD-10-CM

## 2013-08-23 DIAGNOSIS — I4891 Unspecified atrial fibrillation: Secondary | ICD-10-CM

## 2013-08-23 DIAGNOSIS — Z23 Encounter for immunization: Secondary | ICD-10-CM | POA: Diagnosis not present

## 2013-08-23 DIAGNOSIS — I059 Rheumatic mitral valve disease, unspecified: Secondary | ICD-10-CM

## 2013-08-23 DIAGNOSIS — Z0000 Encounter for general adult medical examination without abnormal findings: Secondary | ICD-10-CM

## 2013-08-23 DIAGNOSIS — I2789 Other specified pulmonary heart diseases: Secondary | ICD-10-CM

## 2013-08-23 DIAGNOSIS — I340 Nonrheumatic mitral (valve) insufficiency: Secondary | ICD-10-CM

## 2013-08-23 DIAGNOSIS — I272 Pulmonary hypertension, unspecified: Secondary | ICD-10-CM

## 2013-08-23 DIAGNOSIS — I10 Essential (primary) hypertension: Secondary | ICD-10-CM | POA: Diagnosis not present

## 2013-08-23 NOTE — Progress Notes (Signed)
[name] Date of Birth: 1932/01/16   History of Present Illness: [name] is seen today for followup. She has a history of chronic atrial fibrillation, hypertensive heart disease, and pulmonary hypertension. She reports that she fell 2 weeks ago on her left knee. She had fairly extensive bruising related to this. It is getting better. She is walking more now that the weather is cool. She reports that her breathing is doing pretty well. She denies any cough or chest pain.  Current Outpatient Prescriptions on File Prior to Visit  Medication Sig Dispense Refill  . Ascorbic Acid (VITAMIN C) 1000 MG tablet Take 1,000 mg by mouth daily.        Marland Kitchen atorvastatin (LIPITOR) 20 MG tablet Take 1 tablet by mouth Daily.      . Biotin 1000 MCG tablet Take 1,000 mcg by mouth daily.        . Calcium Carbonate-Vitamin D (CALCIUM + D PO) Take by mouth daily.        . Cholecalciferol (VITAMIN D PO) Take by mouth daily.        . Cyanocobalamin (VITAMIN B-12 PO) Take by mouth as directed.      . diltiazem (CARDIZEM CD) 120 MG 24 hr capsule TAKE ONE CAPSULE EVERY DAY  30 capsule  0  . estrogens, conjugated, (PREMARIN) 1.25 MG tablet Take 1.25 mg by mouth daily.        . furosemide (LASIX) 40 MG tablet Take 2 tablets in the am and 1 tablet in the pm  90 tablet  6  . gabapentin (NEURONTIN) 100 MG capsule Take 1 tablet by mouth Daily.      Marland Kitchen HYDROcodone-acetaminophen (NORCO) 7.5-325 MG per tablet Take 1 tablet by mouth every 6 (six) hours as needed for pain.  30 tablet  0  . lisinopril (PRINIVIL,ZESTRIL) 20 MG tablet Take 1 tablet by mouth Daily.      . Magnesium 400 MG CAPS Take by mouth daily.        . Multiple Vitamins-Minerals (ZINC PO) Take by mouth daily.        Marland Kitchen PARoxetine (PAXIL-CR) 37.5 MG 24 hr tablet Take 37.5 mg by mouth every morning.        . Potassium (POTASSIMIN PO) Take 595 mg by mouth daily. 3 TABLETS DAILY      . valACYclovir (VALTREX) 500 MG tablet       . vitamin A 8000 UNIT  capsule Take 8,000 Units by mouth daily.        Marland Kitchen warfarin (COUMADIN) 5 MG tablet Take 1 tablet (5 mg total) by mouth as directed.  30 tablet  3  . [DISCONTINUED] diltiazem (CARDIZEM) 120 MG tablet Take 1 tablet (120 mg total) by mouth daily.  30 tablet  5   No current facility-administered medications on file prior to visit.    Allergies  Allergen Reactions  . Codeine Nausea And Vomiting  . Penicillins   . Sulfonamide Derivatives Rash    Past Medical History  Diagnosis Date  . Mitral insufficiency     CHRONIC  . Hypertensive heart disease     WITH LVH  . LVH (left ventricular hypertrophy) due to hypertensive disease   . Chronic atrial fibrillation   . Pulmonary hypertension   . Hypercholesterolemia   . Back pain, chronic   . Depression   . Coronary artery disease CARDIOLOGIST- DR [place]--  LOV IN EPIC  . Anticoagulated on Coumadin     CHRONIC  . Arthritis   .  Anemia   . Hypertension   . Hammer toe   . Transfusion history     Past Surgical History  Procedure Laterality Date  . Total hip arthroplasty      BILATERAL---  LEFT 4/98;   RIGHT  11/98  . Abdominal hysterectomy    . Varus osteotomy proximal phalanx, left great toe/ paring down the callus , second left toe  10-27-2008  . Repair right second claw toe/ varus medial closing wedge osteotomy proximal phalanx right great toe  05-05-2005  . Anterior repair/ sparc pubovaginal sling  05-03-2002  . Pars plana vitrectomy w/ repair of macular hole  02-04-2002    LEFT EYE  . Cardiac catheterization  02-03-2009  DR  [place]    SINGLE-VESSEL OBSTRUCTIVE CAD, SMALL DIAGONAL BRANCH/ NORMAL LVF/ SEVERE MITRAL INSUFFICIENCY/ SEVERE PULMONARY HYPERTENSION  . Cataract extraction w/ intraocular lens  implant, bilateral  2003  . Hammer toe surgery  02/28/2012    Procedure: HAMMER TOE CORRECTION;  Surgeon: Drucilla Schmidt, MD;  Location: [facility];  Service: Orthopedics;  Laterality: Right;  FUSION OF PROXIMAL  INTERPHALANGEAL  RIGHT THIRD CLAW TOE  . Bunionectomy with hammertoe reconstruction Right 03/01/2013    Procedure: RIGHT FOOT EXCISION BUNIONETTE AND FUSION OF DIP FOURTH TOE;  Surgeon: Drucilla Schmidt, MD;  Location: [facility];  Service: Orthopedics;  Laterality: Right;    History  Smoking status  . Never Smoker   Smokeless tobacco  . Never Used    History  Alcohol Use  . Yes    Comment: occasional    Family History  Problem Relation Age of Onset  . Heart disease Mother 21  . Prostate cancer Father 36    Review of Systems:  As noted in history of present illness. She is scheduled for outpatient surgery on her toe for a hammertoe. All other systems were reviewed and are negative.  Physical Exam: BP 130/68  Pulse 92  Ht 5\' 5"  (1.651 m)  Wt 161 lb 12.8 oz (73.392 kg)  BMI 26.92 kg/m2 She is a pleasant white female in no acute distress. Her HEENT exam is unremarkable. Sclera are clear. Oropharynx is clear. Neck is supple without JVD, adenopathy, thyromegaly, or bruits. Lungs are clear. Cardiac exam reveals a regular rate and rhythm without gallop. There is a grade 2/6 systolic murmur heard best at the left sternal border. Abdomen is soft and nontender. She has no significant edema. Pedal pulses are palpable. She does have fairly extensive bruising of her left leg from the knee down. She is alert and oriented x3. Cranial nerves II through XII are intact.  LABORATORY DATA: INR today is 4.4.  ECG demonstrates atrial fibrillation with a rate of 78 beats per minute. There is low voltage QRS.  Echo:Study Conclusions  - Left ventricle: The cavity size was normal. Wall thickness was increased in a pattern of moderate LVH. There was focal basal hypertrophy. Systolic function was vigorous. The estimated ejection fraction was in the range of 65% to 70%. Wall motion was normal; there were no regional wall motion abnormalities. - Aortic valve: Mildly to moderately  calcified annulus. Trileaflet. Mild regurgitation. - Mitral valve: There was systolic anterior motion. Moderate regurgitation. - Left atrium: The atrium was moderately dilated. - Right atrium: The atrium was mildly dilated. - Atrial septum: No defect or patent foramen ovale was identified. - Tricuspid valve: Moderate-severe regurgitation. - Pulmonary arteries: Systolic pressure was moderately increased. PA peak pressure: 51mm Hg (S).  Assessment / Plan: 1. Atrial fibrillation, permanent. Rate is well controlled on diltiazem. She is anticoagulated on Coumadin.   2. Mitral insufficiency, moderate.   3. Hypertension with hypertensive heart disease, well controlled.  4. hyperlipidemia on statin therapy. Her last lipid panel showed excellent control. She had recent lab with her primary care we will obtain a copy.  5. Pulmonary hypertension-moderate  6. Coronary disease. Cardiac catheterization in 2010 showed severe stenosis in a small diagonal branch. She otherwise had no significant disease. She is asymptomatic.   Patient was administered a flu shot today. I will followup again in 6 months.

## 2013-08-23 NOTE — Patient Instructions (Signed)
Continue your current medications except adjust your coumadin as directed.  I will see you in 6 months

## 2013-09-25 ENCOUNTER — Other Ambulatory Visit: Payer: Self-pay

## 2013-09-25 ENCOUNTER — Other Ambulatory Visit: Payer: Self-pay | Admitting: Cardiology

## 2013-09-25 ENCOUNTER — Ambulatory Visit (INDEPENDENT_AMBULATORY_CARE_PROVIDER_SITE_OTHER): Payer: Medicare Other | Admitting: *Deleted

## 2013-09-25 DIAGNOSIS — Z7901 Long term (current) use of anticoagulants: Secondary | ICD-10-CM

## 2013-09-25 DIAGNOSIS — I482 Chronic atrial fibrillation, unspecified: Secondary | ICD-10-CM

## 2013-09-25 DIAGNOSIS — I4891 Unspecified atrial fibrillation: Secondary | ICD-10-CM | POA: Diagnosis not present

## 2013-09-25 LAB — POCT INR: INR: 1.8

## 2013-09-25 MED ORDER — FUROSEMIDE 40 MG PO TABS: ORAL_TABLET | ORAL | Status: DC

## 2013-10-09 ENCOUNTER — Telehealth: Payer: Self-pay | Admitting: Cardiology

## 2013-10-09 NOTE — Telephone Encounter (Signed)
New Message  Pt called asks if our office is going to offer the new Prevnar 13// New Drug for influenza and pneumonia... For even more resistance// Please call back to discuss

## 2013-10-10 NOTE — Telephone Encounter (Signed)
Returned call to patient we do not give pneumonia vaccine.Advised to call PCP.

## 2013-10-10 NOTE — Telephone Encounter (Signed)
Follow up  ° ° ° °Returning call back to nurse  °

## 2013-10-10 NOTE — Telephone Encounter (Signed)
Returned call to patient no answer.LMTC. 

## 2013-10-23 ENCOUNTER — Ambulatory Visit (INDEPENDENT_AMBULATORY_CARE_PROVIDER_SITE_OTHER): Payer: Medicare Other | Admitting: *Deleted

## 2013-10-23 DIAGNOSIS — Z5181 Encounter for therapeutic drug level monitoring: Secondary | ICD-10-CM

## 2013-10-23 DIAGNOSIS — I4891 Unspecified atrial fibrillation: Secondary | ICD-10-CM

## 2013-10-23 DIAGNOSIS — Z7901 Long term (current) use of anticoagulants: Secondary | ICD-10-CM

## 2013-10-23 DIAGNOSIS — I482 Chronic atrial fibrillation, unspecified: Secondary | ICD-10-CM

## 2013-10-23 LAB — POCT INR: INR: 2.5

## 2013-11-18 ENCOUNTER — Telehealth: Payer: Self-pay | Admitting: Cardiology

## 2013-11-18 NOTE — Telephone Encounter (Signed)
New message      Want presc for new pneumonia shot prevnair----want to check with cardiologist instead of family doctor

## 2013-11-18 NOTE — Telephone Encounter (Signed)
Returned call to patient she stated she wanted to get new pneumonia vaccine.Advised to call PCP.

## 2013-11-24 IMAGING — CR DG CHEST 2V
2 series · 2 of 2 positions shown · non-contrast
Comparison: CT chest dated 03/25/2010

CLINICAL DATA: Preop hammer toe surgery

CHEST - 2 VIEW

[w chest pa]
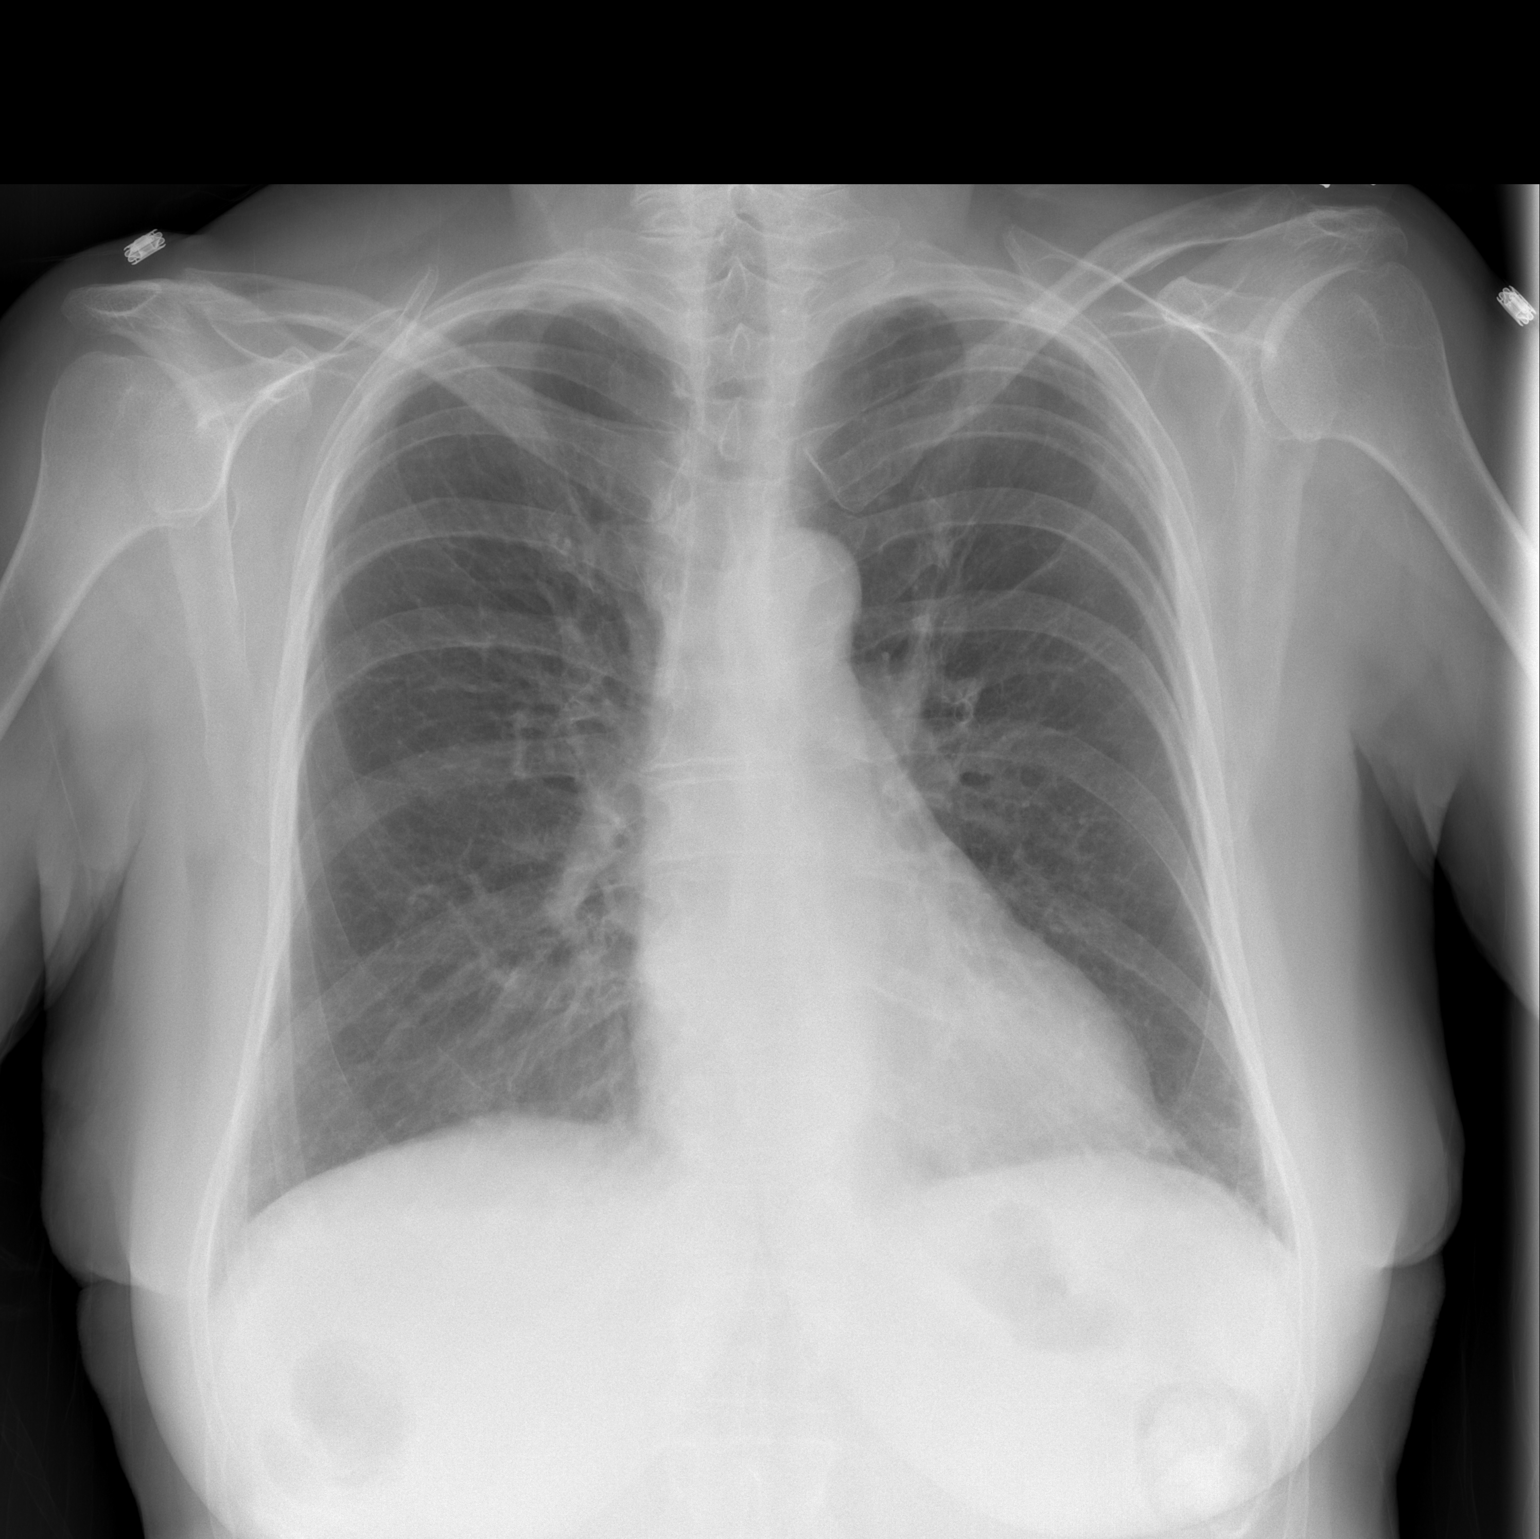

[w chest lat]
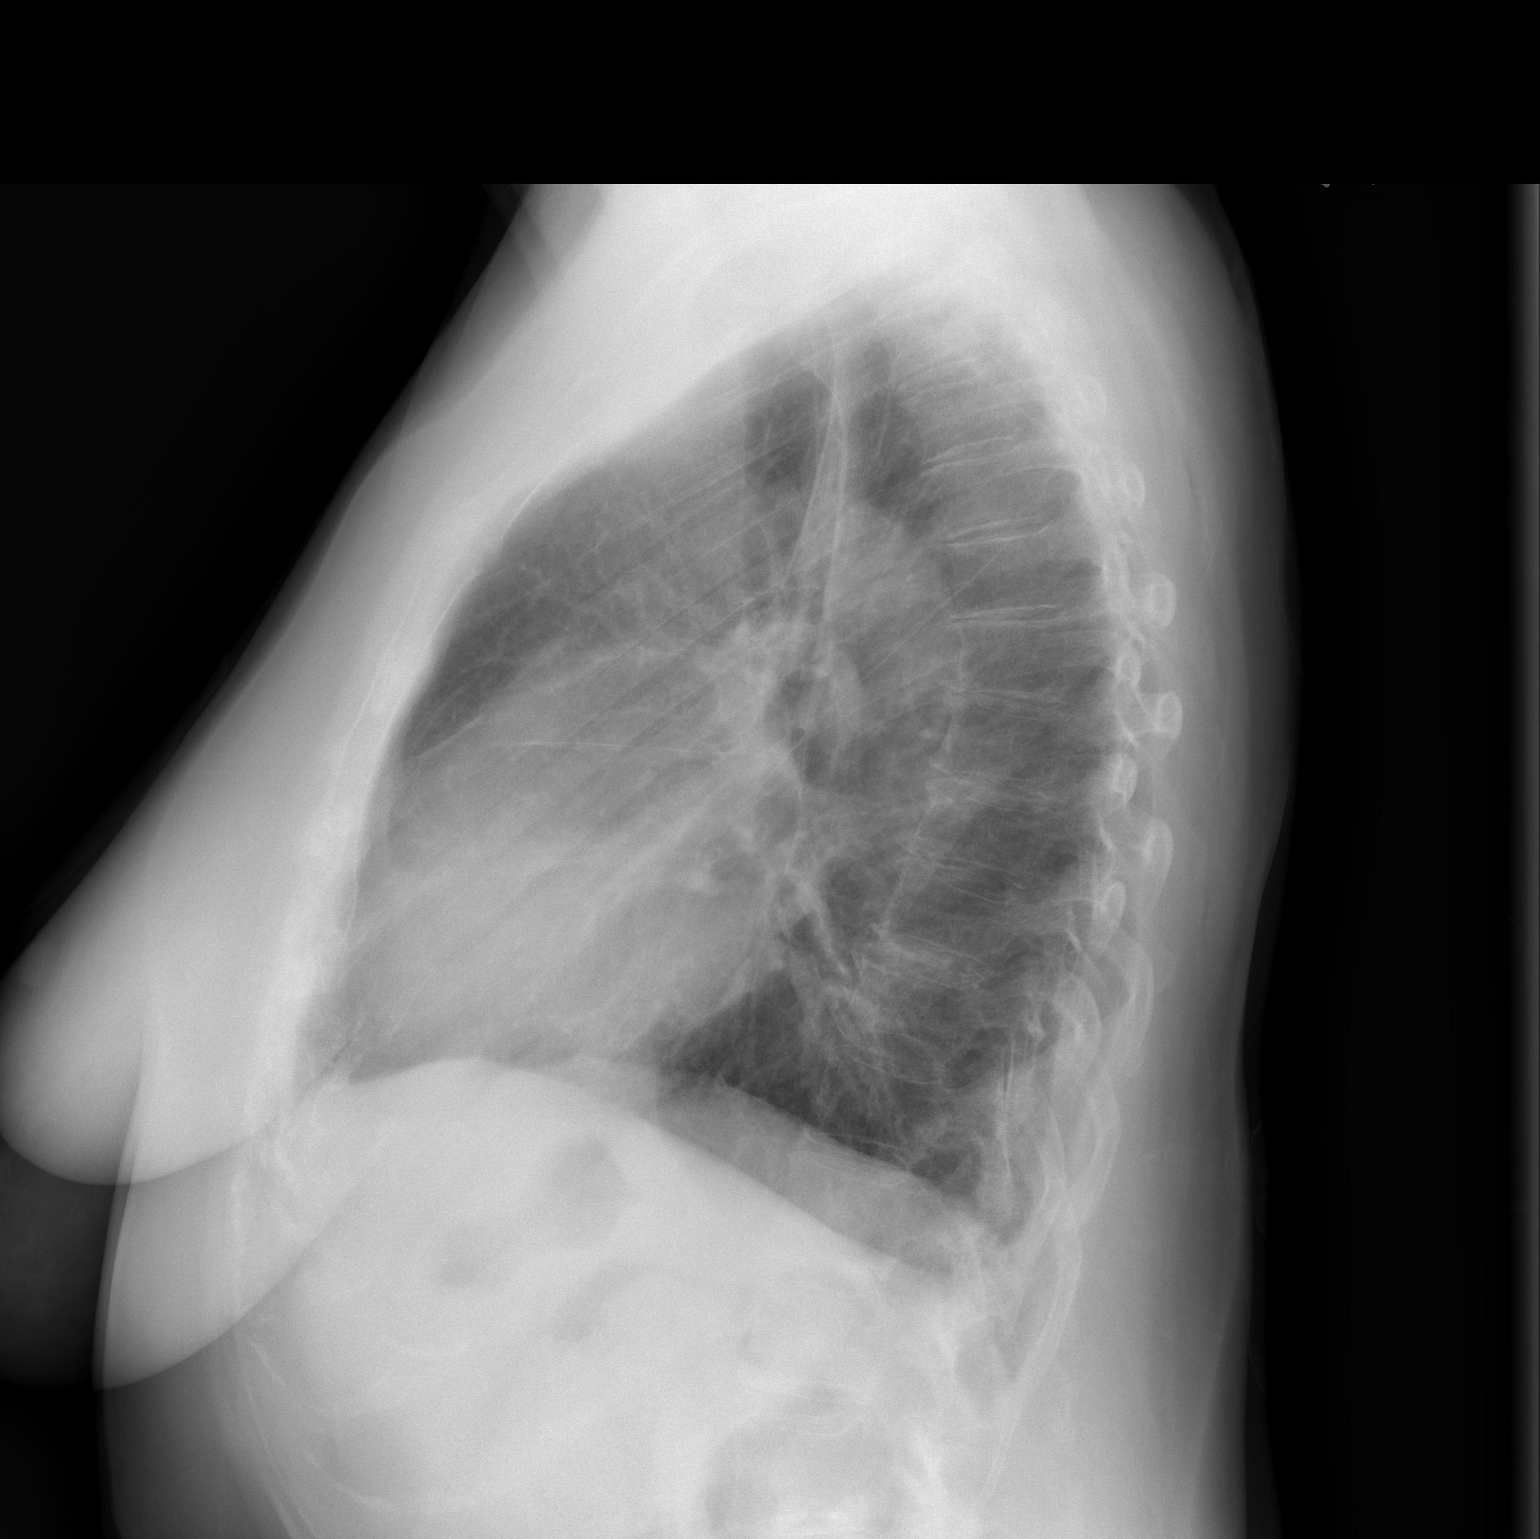

[2 of 2 positions shown; findings below may reference images not displayed]

FINDINGS: Chronic interstitial markings.  No pleural effusion or
pneumothorax.

The heart is normal in size.

Degenerative changes of the visualized thoracolumbar spine.
IMPRESSION: No evidence of acute cardiopulmonary disease.

## 2013-11-27 ENCOUNTER — Ambulatory Visit (INDEPENDENT_AMBULATORY_CARE_PROVIDER_SITE_OTHER): Payer: Medicare Other | Admitting: *Deleted

## 2013-11-27 DIAGNOSIS — G894 Chronic pain syndrome: Secondary | ICD-10-CM | POA: Diagnosis not present

## 2013-11-27 DIAGNOSIS — I4891 Unspecified atrial fibrillation: Secondary | ICD-10-CM | POA: Diagnosis not present

## 2013-11-27 DIAGNOSIS — M5137 Other intervertebral disc degeneration, lumbosacral region: Secondary | ICD-10-CM | POA: Diagnosis not present

## 2013-11-27 DIAGNOSIS — Z5181 Encounter for therapeutic drug level monitoring: Secondary | ICD-10-CM

## 2013-11-27 DIAGNOSIS — Z7901 Long term (current) use of anticoagulants: Secondary | ICD-10-CM | POA: Diagnosis not present

## 2013-11-27 DIAGNOSIS — I482 Chronic atrial fibrillation, unspecified: Secondary | ICD-10-CM

## 2013-11-27 LAB — POCT INR: INR: 2.8

## 2013-12-17 ENCOUNTER — Ambulatory Visit (INDEPENDENT_AMBULATORY_CARE_PROVIDER_SITE_OTHER): Payer: Medicare Other | Admitting: Pharmacist Clinician (PhC)/ Clinical Pharmacy Specialist

## 2013-12-17 DIAGNOSIS — I4891 Unspecified atrial fibrillation: Secondary | ICD-10-CM

## 2013-12-17 DIAGNOSIS — M47817 Spondylosis without myelopathy or radiculopathy, lumbosacral region: Secondary | ICD-10-CM | POA: Diagnosis not present

## 2013-12-17 DIAGNOSIS — I482 Chronic atrial fibrillation, unspecified: Secondary | ICD-10-CM

## 2013-12-17 DIAGNOSIS — Z7901 Long term (current) use of anticoagulants: Secondary | ICD-10-CM | POA: Diagnosis not present

## 2013-12-17 LAB — POCT INR: INR: 1.2

## 2013-12-25 ENCOUNTER — Other Ambulatory Visit: Payer: Self-pay | Admitting: *Deleted

## 2013-12-25 ENCOUNTER — Ambulatory Visit (INDEPENDENT_AMBULATORY_CARE_PROVIDER_SITE_OTHER): Payer: Medicare Other

## 2013-12-25 DIAGNOSIS — Z7901 Long term (current) use of anticoagulants: Secondary | ICD-10-CM | POA: Diagnosis not present

## 2013-12-25 DIAGNOSIS — I4891 Unspecified atrial fibrillation: Secondary | ICD-10-CM | POA: Diagnosis not present

## 2013-12-25 DIAGNOSIS — I482 Chronic atrial fibrillation, unspecified: Secondary | ICD-10-CM

## 2013-12-25 DIAGNOSIS — Z5181 Encounter for therapeutic drug level monitoring: Secondary | ICD-10-CM

## 2013-12-25 LAB — POCT INR: INR: 2.9

## 2013-12-25 MED ORDER — WARFARIN SODIUM 5 MG PO TABS: ORAL_TABLET | ORAL | Status: DC

## 2013-12-27 DIAGNOSIS — Z0000 Encounter for general adult medical examination without abnormal findings: Secondary | ICD-10-CM | POA: Diagnosis not present

## 2013-12-27 DIAGNOSIS — I119 Hypertensive heart disease without heart failure: Secondary | ICD-10-CM | POA: Diagnosis not present

## 2013-12-27 DIAGNOSIS — E785 Hyperlipidemia, unspecified: Secondary | ICD-10-CM | POA: Diagnosis not present

## 2013-12-27 DIAGNOSIS — R7309 Other abnormal glucose: Secondary | ICD-10-CM | POA: Diagnosis not present

## 2013-12-27 DIAGNOSIS — I4891 Unspecified atrial fibrillation: Secondary | ICD-10-CM | POA: Diagnosis not present

## 2013-12-27 DIAGNOSIS — Z23 Encounter for immunization: Secondary | ICD-10-CM | POA: Diagnosis not present

## 2013-12-27 DIAGNOSIS — E559 Vitamin D deficiency, unspecified: Secondary | ICD-10-CM | POA: Diagnosis not present

## 2014-01-01 DIAGNOSIS — G894 Chronic pain syndrome: Secondary | ICD-10-CM | POA: Diagnosis not present

## 2014-01-15 ENCOUNTER — Ambulatory Visit: Payer: Medicare Other | Admitting: Neurology

## 2014-01-15 ENCOUNTER — Encounter (INDEPENDENT_AMBULATORY_CARE_PROVIDER_SITE_OTHER): Payer: Self-pay

## 2014-01-15 ENCOUNTER — Ambulatory Visit (INDEPENDENT_AMBULATORY_CARE_PROVIDER_SITE_OTHER): Payer: Medicare Other | Admitting: Diagnostic Neuroimaging

## 2014-01-15 ENCOUNTER — Encounter: Payer: Self-pay | Admitting: Diagnostic Neuroimaging

## 2014-01-15 VITALS — BP 138/76 | HR 95 | Temp 98.3°F | Ht 65.5 in | Wt 160.0 lb

## 2014-01-15 DIAGNOSIS — M545 Low back pain, unspecified: Secondary | ICD-10-CM

## 2014-01-15 DIAGNOSIS — R209 Unspecified disturbances of skin sensation: Secondary | ICD-10-CM | POA: Diagnosis not present

## 2014-01-15 DIAGNOSIS — R200 Anesthesia of skin: Secondary | ICD-10-CM

## 2014-01-15 DIAGNOSIS — G609 Hereditary and idiopathic neuropathy, unspecified: Secondary | ICD-10-CM | POA: Diagnosis not present

## 2014-01-15 NOTE — Progress Notes (Addendum)
[facility]  PATIENT: [name] DOB: 04/10/1932  REFERRING CLINICIAN: Ramos HISTORY FROM: patient  REASON FOR VISIT: new consult   HISTORICAL  CHIEF COMPLAINT:  Chief Complaint  Patient presents with  . Numbness    HISTORY OF PRESENT ILLNESS:   80 year old right-handed female with history of atrial fibrillation, hypertension, hypercholesteremia, anxiety, here for evaluation of lower extremity numbness in feet and toes.  Since 2000 fall patient has had numbness and tingling sensation in her feet and toes. Symptoms worsen left than right foot. Now numbness is spreading up to her ankles. Patient also has chronic low back pain from 1990s, mainly in the lower back and buttock region.  Patient also has noted some blue discoloration of her toes, particularly in early morning, changing to a bright red color later in the day. Cold or hot temperature does not seem to affect this. She also has some cramps in her thighs. Patient had bilateral lower extremity arterial ultrasound in April 2014 which was unremarkable. Interestingly, patient does not recall having this test done. Also during her conversations, patient tends to repeat herself with some items but is somewhat tangential in other areas.  REVIEW OF SYSTEMS: Full 14 system review of systems performed and notable only for insomnia numbness anxiety not asleep joint pain cramps achy muscles consists of 3 shortness of breath anemia easy bruising easy bleeding weight gain.  ALLERGIES: Allergies  Allergen Reactions  . Codeine Nausea And Vomiting  . Penicillins   . Sulfonamide Derivatives Rash    HOME MEDICATIONS: Outpatient Prescriptions Prior to Visit  Medication Sig Dispense Refill  . Ascorbic Acid (VITAMIN C) 1000 MG tablet Take 1,000 mg by mouth daily.        Marland Kitchen atorvastatin (LIPITOR) 20 MG tablet Take 1 tablet by mouth Daily.      . Calcium Carbonate-Vitamin D (CALCIUM + D PO) Take by mouth daily.         . Cholecalciferol (VITAMIN D PO) Take by mouth daily.        . Cyanocobalamin (VITAMIN B-12 PO) Take by mouth as directed.      . diltiazem (CARDIZEM CD) 120 MG 24 hr capsule TAKE ONE CAPSULE BY MOUTH EVERY DAY  30 capsule  5  . estrogens, conjugated, (PREMARIN) 1.25 MG tablet Take 1.25 mg by mouth daily.        . furosemide (LASIX) 40 MG tablet Take 2 tablets in the am and 1 tablet in the pm  90 tablet  6  . gabapentin (NEURONTIN) 100 MG capsule Take 1 tablet by mouth Daily.      Marland Kitchen lisinopril (PRINIVIL,ZESTRIL) 20 MG tablet Take 1 tablet by mouth Daily.      . Magnesium 400 MG CAPS Take by mouth daily.        . Multiple Vitamins-Minerals (ZINC PO) Take by mouth daily.        Marland Kitchen PARoxetine (PAXIL-CR) 37.5 MG 24 hr tablet Take 37.5 mg by mouth every morning.        . Potassium (POTASSIMIN PO) Take 595 mg by mouth daily. 3 TABLETS DAILY      . valACYclovir (VALTREX) 500 MG tablet       . vitamin A 8000 UNIT capsule Take 8,000 Units by mouth daily.        Marland Kitchen warfarin (COUMADIN) 5 MG tablet Take as directed by coumadin clinic  30 tablet  3  . Biotin 1000 MCG tablet Take 1,000 mcg by mouth daily.        Marland Kitchen  HYDROcodone-acetaminophen (NORCO) 7.5-325 MG per tablet Take 1 tablet by mouth every 6 (six) hours as needed for pain.  30 tablet  0   No facility-administered medications prior to visit.    PAST MEDICAL HISTORY: Past Medical History  Diagnosis Date  . Mitral insufficiency     CHRONIC  . Hypertensive heart disease     WITH LVH  . LVH (left ventricular hypertrophy) due to hypertensive disease   . Chronic atrial fibrillation   . Pulmonary hypertension   . Hypercholesterolemia   . Back pain, chronic   . Depression   . Coronary artery disease CARDIOLOGIST- DR [name]--  LOV IN EPIC  . Anticoagulated on Coumadin     CHRONIC  . Arthritis   . Anemia   . Hypertension   . Hammer toe   . Transfusion history     PAST SURGICAL HISTORY: Past Surgical History  Procedure Laterality Date    . Total hip arthroplasty      BILATERAL---  LEFT 4/98;   RIGHT  11/98  . Abdominal hysterectomy    . Varus osteotomy proximal phalanx, left great toe/ paring down the callus , second left toe  10-27-2008  . Repair right second claw toe/ varus medial closing wedge osteotomy proximal phalanx right great toe  05-05-2005  . Anterior repair/ sparc pubovaginal sling  05-03-2002  . Pars plana vitrectomy w/ repair of macular hole  02-04-2002    LEFT EYE  . Cardiac catheterization  02-03-2009  DR  [name]    SINGLE-VESSEL OBSTRUCTIVE CAD, SMALL DIAGONAL BRANCH/ NORMAL LVF/ SEVERE MITRAL INSUFFICIENCY/ SEVERE PULMONARY HYPERTENSION  . Cataract extraction w/ intraocular lens  implant, bilateral  2003  . Hammer toe surgery  02/28/2012    Procedure: HAMMER TOE CORRECTION;  Surgeon: [name], MD;  Location: [facility];  Service: Orthopedics;  Laterality: Right;  FUSION OF PROXIMAL INTERPHALANGEAL  RIGHT THIRD CLAW TOE  . Bunionectomy with hammertoe reconstruction Right 03/01/2013    Procedure: RIGHT FOOT EXCISION BUNIONETTE AND FUSION OF DIP FOURTH TOE;  Surgeon: [name], MD;  Location: [facility];  Service: Orthopedics;  Laterality: Right;    FAMILY HISTORY: Family History  Problem Relation Age of Onset  . Heart disease Mother 32  . Prostate cancer Father 44    SOCIAL HISTORY:  History   Social History  . Marital Status: Divorced    Spouse Name: N/A    Number of Children: 3  . Years of Education: College   Occupational History  . Retired    Social History Main Topics  . Smoking status: Never Smoker   . Smokeless tobacco: Never Used  . Alcohol Use: Yes     Comment: occasional  . Drug Use: No  . Sexual Activity: Not on file   Other Topics Concern  . Not on file   Social History Narrative   Patient lives at home alone.   Caffeine Use: 1 cup daily occasionally     PHYSICAL EXAM  Filed Vitals:   01/15/14 0947  BP: 138/76   Pulse: 95  Temp: 98.3 F (36.8 C)  TempSrc: Oral  Height: 5' 5.5" (1.664 m)  Weight: 160 lb (72.576 kg)    Not recorded    Body mass index is 26.21 kg/(m^2).  GENERAL EXAM: Patient is in no distress; well developed, nourished and groomed; neck is supple  CARDIOVASCULAR: Regular rate and rhythm, no murmurs, no carotid bruits; VARICOSE VEINS IN FEET/LEGS. BLUE DISCOLORATION IN TOES (LEFT  WORSE THAN RIGHT). DORSALIS PEDIS PULSES FAINT BUT PALPABLE.  NEUROLOGIC: MENTAL STATUS: awake, alert, oriented to person, place and time, recent and remote memory intact, normal attention and concentration, language fluent, comprehension intact, naming intact, fund of knowledge appropriate; PERSEVERATES IN [place].  CRANIAL NERVE: no papilledema on fundoscopic exam, pupils equal and reactive to light, visual fields full to confrontation, extraocular muscles intact, no nystagmus, facial sensation and strength symmetric, hearing intact, palate elevates symmetrically, uvula midline, shoulder shrug symmetric, tongue midline. MOTOR: normal bulk and tone, full strength in the BUE, BLE SENSORY: normal and symmetric to light touch and proprioception; ABSENT VIB AT TOES. DECR PP AND TEMP IN FEET. COORDINATION: finger-nose-finger, fine finger movements normal REFLEXES: deep tendon reflexes present and symmetric GAIT/STATION: narrow based gait; SLOW TO RISE, UNSTEADY. ALMOST FALLS WHEN SHE STANDS FEET TOGETHER AND EYES OPEN. ALMOST FALLS WITH TANDEM, TOE AND HEEL GAIT.     DIAGNOSTIC DATA (LABS, IMAGING, TESTING) - I reviewed patient records, labs, notes, testing and imaging myself where available.  Lab Results  Component Value Date   WBC 3.8* 01/26/2012   HGB 12.2 03/01/2013   HCT 36.0 03/01/2013   MCV 94.4 01/26/2012   PLT 225.0 01/26/2012      Component Value Date/Time   NA 141 03/01/2013 1100   K 5.0 03/01/2013 1100   CL 104 03/01/2013 1100   CO2 28 02/23/2012 1155    GLUCOSE 102* 03/01/2013 1100   BUN 10 03/01/2013 1100   CREATININE 0.90 03/01/2013 1100   CALCIUM 9.1 02/23/2012 1155   PROT 6.8 01/26/2012 0924   ALBUMIN 3.5 01/26/2012 0924   AST 22 01/26/2012 0924   ALT 15 01/26/2012 0924   ALKPHOS 64 01/26/2012 0924   BILITOT 0.3 01/26/2012 0924   GFRNONAA >60 10/27/2008 1110   GFRAA  Value: >60        The eGFR has been calculated using the MDRD equation. This calculation has not been validated in all clinical 10/27/2008 1110   Lab Results  Component Value Date   CHOL 157 01/26/2012   HDL 70.10 01/26/2012   LDLCALC 69 01/26/2012   TRIG 91.0 01/26/2012   CHOLHDL 2 01/26/2012   No results found for this basename: HGBA1C   No results found for this basename: VITAMINB12   No results found for this basename: TSH    02/21/13 BLE arterial u/s - normal ABIs, no evidence of lower ext arterial disease    ASSESSMENT AND PLAN  80 y.o. year old female here with  numbness in feet since 2012. Suspect peripheral neuropathy. Also with varicose veins and blue discoloration and feet, may represent vascular insufficiency, small vessel disease or Raynaud phenomenon.   PLAN: - neuropathy panel labs - recommend physical therapy for balance and gait training; she is high risk for falls (due to neuropathy and lumbar radiculopathy) and this is particularly concerning since she is on coumadin - consider discussing fall risk and coumadin usage with cardiology; also discuss lower ext discoloration.   Orders Placed This Encounter  Procedures  . Neuropathy Panel  . Vitamin B12  . TSH  . Hemoglobin A1c   Return in about 6 months (around 07/18/2014) for with Charlott Holler or Tiffany Calmes.    Penni Bombard, MD 6/76/7209, 47:09 AM Certified in Neurology, Neurophysiology and Neuroimaging  [facility] [address], [place], [place] [address] [phone]

## 2014-01-15 NOTE — Patient Instructions (Signed)
I will check labs for causes of neuropathy.  Try physical therapy for balance and walking.  Be very careful with warfarin use and falling. I suggest you use a cane or walker.

## 2014-01-17 LAB — HEMOGLOBIN A1C
Est. average glucose Bld gHb Est-mCnc: 137 mg/dL
HEMOGLOBIN A1C: 6.4 % — AB (ref 4.8–5.6)

## 2014-01-17 LAB — NEUROPATHY PANEL
A/G RATIO SPE: 1.2 (ref 0.7–2.0)
ALPHA 1: 0.2 g/dL (ref 0.1–0.4)
ANA: NEGATIVE
Albumin ELP: 3.8 g/dL (ref 3.2–5.6)
Alpha 2: 0.9 g/dL (ref 0.4–1.2)
Beta: 1.3 g/dL (ref 0.6–1.3)
GAMMA GLOBULIN: 0.8 g/dL (ref 0.5–1.6)
Globulin, Total: 3.3 g/dL (ref 2.0–4.5)
RHEUMATOID FACTOR: 13.1 [IU]/mL (ref 0.0–13.9)
SED RATE: 26 mm/h (ref 0–40)
TSH: 1.98 u[IU]/mL (ref 0.450–4.500)
Total Protein: 7.1 g/dL (ref 6.0–8.5)
Vit D, 25-Hydroxy: 41.5 ng/mL (ref 30.0–100.0)
Vitamin B-12: >1999 pg/mL — ABNORMAL HIGH (ref 211–946)

## 2014-01-20 ENCOUNTER — Other Ambulatory Visit: Payer: Self-pay | Admitting: Cardiology

## 2014-01-20 NOTE — Progress Notes (Signed)
Elevated A1c; needs PCP follow up.

## 2014-01-22 ENCOUNTER — Ambulatory Visit (INDEPENDENT_AMBULATORY_CARE_PROVIDER_SITE_OTHER): Payer: Medicare Other | Admitting: Pharmacist

## 2014-01-22 DIAGNOSIS — Z7901 Long term (current) use of anticoagulants: Secondary | ICD-10-CM | POA: Diagnosis not present

## 2014-01-22 DIAGNOSIS — G894 Chronic pain syndrome: Secondary | ICD-10-CM | POA: Diagnosis not present

## 2014-01-22 DIAGNOSIS — I4891 Unspecified atrial fibrillation: Secondary | ICD-10-CM | POA: Diagnosis not present

## 2014-01-22 DIAGNOSIS — Z5181 Encounter for therapeutic drug level monitoring: Secondary | ICD-10-CM | POA: Diagnosis not present

## 2014-01-22 DIAGNOSIS — I482 Chronic atrial fibrillation, unspecified: Secondary | ICD-10-CM

## 2014-01-22 LAB — POCT INR: INR: 2.8

## 2014-01-27 DIAGNOSIS — H04219 Epiphora due to excess lacrimation, unspecified lacrimal gland: Secondary | ICD-10-CM | POA: Diagnosis not present

## 2014-01-27 DIAGNOSIS — H01009 Unspecified blepharitis unspecified eye, unspecified eyelid: Secondary | ICD-10-CM | POA: Diagnosis not present

## 2014-01-27 DIAGNOSIS — H353190 Nonexudative age-related macular degeneration, unspecified eye, stage unspecified: Secondary | ICD-10-CM | POA: Diagnosis not present

## 2014-01-27 DIAGNOSIS — H40019 Open angle with borderline findings, low risk, unspecified eye: Secondary | ICD-10-CM | POA: Diagnosis not present

## 2014-01-27 DIAGNOSIS — H04129 Dry eye syndrome of unspecified lacrimal gland: Secondary | ICD-10-CM | POA: Diagnosis not present

## 2014-01-31 DIAGNOSIS — M47817 Spondylosis without myelopathy or radiculopathy, lumbosacral region: Secondary | ICD-10-CM | POA: Diagnosis not present

## 2014-01-31 DIAGNOSIS — M545 Low back pain, unspecified: Secondary | ICD-10-CM | POA: Diagnosis not present

## 2014-01-31 DIAGNOSIS — M5030 Other cervical disc degeneration, unspecified cervical region: Secondary | ICD-10-CM | POA: Diagnosis not present

## 2014-01-31 DIAGNOSIS — M47812 Spondylosis without myelopathy or radiculopathy, cervical region: Secondary | ICD-10-CM | POA: Diagnosis not present

## 2014-02-10 ENCOUNTER — Telehealth: Payer: Self-pay | Admitting: Diagnostic Neuroimaging

## 2014-02-10 NOTE — Telephone Encounter (Signed)
Would like the visit information including labs sent to Dr. Nelva Bush who is the Dr. Parks Ranger referred her here.

## 2014-02-20 ENCOUNTER — Ambulatory Visit (INDEPENDENT_AMBULATORY_CARE_PROVIDER_SITE_OTHER): Payer: Medicare Other | Admitting: *Deleted

## 2014-02-20 ENCOUNTER — Ambulatory Visit (INDEPENDENT_AMBULATORY_CARE_PROVIDER_SITE_OTHER): Payer: Medicare Other | Admitting: Cardiology

## 2014-02-20 ENCOUNTER — Encounter: Payer: Self-pay | Admitting: Cardiology

## 2014-02-20 VITALS — BP 155/92 | HR 92 | Ht 65.0 in | Wt 162.0 lb

## 2014-02-20 DIAGNOSIS — I2789 Other specified pulmonary heart diseases: Secondary | ICD-10-CM

## 2014-02-20 DIAGNOSIS — Z7901 Long term (current) use of anticoagulants: Secondary | ICD-10-CM

## 2014-02-20 DIAGNOSIS — I340 Nonrheumatic mitral (valve) insufficiency: Secondary | ICD-10-CM

## 2014-02-20 DIAGNOSIS — I059 Rheumatic mitral valve disease, unspecified: Secondary | ICD-10-CM

## 2014-02-20 DIAGNOSIS — I272 Pulmonary hypertension, unspecified: Secondary | ICD-10-CM

## 2014-02-20 DIAGNOSIS — I482 Chronic atrial fibrillation, unspecified: Secondary | ICD-10-CM

## 2014-02-20 DIAGNOSIS — I4891 Unspecified atrial fibrillation: Secondary | ICD-10-CM

## 2014-02-20 DIAGNOSIS — Z5181 Encounter for therapeutic drug level monitoring: Secondary | ICD-10-CM

## 2014-02-20 DIAGNOSIS — I10 Essential (primary) hypertension: Secondary | ICD-10-CM | POA: Diagnosis not present

## 2014-02-20 LAB — POCT INR: INR: 1.6

## 2014-02-20 NOTE — Patient Instructions (Addendum)
Continue your current therapy  I will see you in 6 months.  HAPPY BIRTHDAY!!!!

## 2014-02-20 NOTE — Progress Notes (Signed)
[name] Date of Birth: [birth year]   History of Present Illness: Mrs. Adcox is seen today for followup. She has a history of chronic atrial fibrillation, hypertensive heart disease, and pulmonary hypertension. She has a history of chronic back problems and had a recent spinal injection by Dr. Nelva Bush. She complains of numbness in both feet. Recent evaluation by Neuro. No claudication. She is 80 years old today. No dyspnea or chest pain.  Current Outpatient Prescriptions on File Prior to Visit  Medication Sig Dispense Refill  . Ascorbic Acid (VITAMIN C) 1000 MG tablet Take 1,000 mg by mouth daily.        Marland Kitchen atorvastatin (LIPITOR) 20 MG tablet Take 1 tablet by mouth Daily.      . Calcium Carbonate-Vitamin D (CALCIUM + D PO) Take by mouth daily.        . Cholecalciferol (VITAMIN D PO) Take by mouth daily.        . Cyanocobalamin (VITAMIN B-12 PO) Take by mouth as directed.      . diltiazem (CARDIZEM CD) 120 MG 24 hr capsule TAKE ONE CAPSULE BY MOUTH EVERY DAY  30 capsule  5  . estrogens, conjugated, (PREMARIN) 1.25 MG tablet Take 1.25 mg by mouth daily.        . furosemide (LASIX) 40 MG tablet Take 2 tablets in the am and 1 tablet in the pm  90 tablet  6  . gabapentin (NEURONTIN) 100 MG capsule Take 1 tablet by mouth Daily.      Marland Kitchen HYDROcodone-acetaminophen (NORCO) 10-325 MG per tablet Take 1 tablet by mouth every 6 (six) hours as needed.      Marland Kitchen lisinopril (PRINIVIL,ZESTRIL) 20 MG tablet Take 1 tablet by mouth Daily.      . Magnesium 400 MG CAPS Take by mouth daily.        . Multiple Vitamins-Minerals (ZINC PO) Take by mouth daily.        Marland Kitchen PARoxetine (PAXIL-CR) 37.5 MG 24 hr tablet Take 37.5 mg by mouth every morning.        . Potassium (POTASSIMIN PO) Take 595 mg by mouth daily. 3 TABLETS DAILY      . valACYclovir (VALTREX) 500 MG tablet       . vitamin A 8000 UNIT capsule Take 8,000 Units by mouth daily.        Marland Kitchen warfarin (COUMADIN) 5 MG tablet Take as directed by coumadin  clinic  30 tablet  3  . [DISCONTINUED] diltiazem (CARDIZEM) 120 MG tablet Take 1 tablet (120 mg total) by mouth daily.  30 tablet  5   No current facility-administered medications on file prior to visit.    Allergies  Allergen Reactions  . Codeine Nausea And Vomiting  . Penicillins   . Sulfonamide Derivatives Rash    Past Medical History  Diagnosis Date  . Mitral insufficiency     CHRONIC  . Hypertensive heart disease     WITH LVH  . LVH (left ventricular hypertrophy) due to hypertensive disease   . Chronic atrial fibrillation   . Pulmonary hypertension   . Hypercholesterolemia   . Back pain, chronic   . Depression   . Coronary artery disease CARDIOLOGIST- DR [name]--  LOV IN EPIC  . Anticoagulated on Coumadin     CHRONIC  . Arthritis   . Anemia   . Hypertension   . Hammer toe   . Transfusion history     Past Surgical History  Procedure Laterality Date  . Total  hip arthroplasty      BILATERAL---  LEFT 4/98;   RIGHT  11/98  . Abdominal hysterectomy    . Varus osteotomy proximal phalanx, left great toe/ paring down the callus , second left toe  10-27-2008  . Repair right second claw toe/ varus medial closing wedge osteotomy proximal phalanx right great toe  05-05-2005  . Anterior repair/ sparc pubovaginal sling  05-03-2002  . Pars plana vitrectomy w/ repair of macular hole  02-04-2002    LEFT EYE  . Cardiac catheterization  02-03-2009  DR  [name]    SINGLE-VESSEL OBSTRUCTIVE CAD, SMALL DIAGONAL BRANCH/ NORMAL LVF/ SEVERE MITRAL INSUFFICIENCY/ SEVERE PULMONARY HYPERTENSION  . Cataract extraction w/ intraocular lens  implant, bilateral  2003  . Hammer toe surgery  02/28/2012    Procedure: HAMMER TOE CORRECTION;  Surgeon: [name], MD;  Location: [facility];  Service: Orthopedics;  Laterality: Right;  FUSION OF PROXIMAL INTERPHALANGEAL  RIGHT THIRD CLAW TOE  . Bunionectomy with hammertoe reconstruction Right 03/01/2013    Procedure: RIGHT FOOT  EXCISION BUNIONETTE AND FUSION OF DIP FOURTH TOE;  Surgeon: [name], MD;  Location: [place];  Service: Orthopedics;  Laterality: Right;    History  Smoking status  . Never Smoker   Smokeless tobacco  . Never Used    History  Alcohol Use  . Yes    Comment: occasional    Family History  Problem Relation Age of Onset  . Heart disease Mother 58  . Prostate cancer Father 35    Review of Systems:  As noted in history of present illness. She is scheduled for outpatient surgery on her toe for a hammertoe. All other systems were reviewed and are negative.  Physical Exam: BP 155/92  Pulse 92  Ht 5\' 5"  (1.651 m)  Wt 162 lb (73.483 kg)  BMI 26.96 kg/m2 She is a pleasant white female in no acute distress. Her HEENT exam is unremarkable. Sclera are clear. Oropharynx is clear. Neck is supple without JVD, adenopathy, thyromegaly, or bruits. Lungs are clear. Cardiac exam reveals a regular rate and rhythm without gallop. There is a grade 2/6 systolic murmur heard best at the left sternal border. Abdomen is soft and nontender. She has no significant edema. DP and PT pulses are palpable. She does have fairly extensive venous varicosities with some dependent bluish discoloration of her toes.  She is alert and oriented x3. Cranial nerves II through XII are intact.  LABORATORY DATA: INR today is 1.6     Assessment / Plan: 1. Atrial fibrillation, permanent. Rate is well controlled on diltiazem. She is anticoagulated on Coumadin.   2. Mitral insufficiency, moderate.   3. Hypertension with hypertensive heart disease, well controlled.  4. Hyperlipidemia on statin therapy. Her last lipid panel showed excellent control.   5. Pulmonary hypertension-moderate  6. Coronary disease. Cardiac catheterization in 2010 showed severe stenosis in a small diagonal branch. She otherwise had no significant disease. She is asymptomatic.   7. Venous varicosities with some  discoloration of her feet due to venous engorgement. Normal pulses.

## 2014-02-25 DIAGNOSIS — R350 Frequency of micturition: Secondary | ICD-10-CM | POA: Diagnosis not present

## 2014-03-01 ENCOUNTER — Encounter: Payer: Self-pay | Admitting: Cardiology

## 2014-03-04 DIAGNOSIS — R350 Frequency of micturition: Secondary | ICD-10-CM | POA: Diagnosis not present

## 2014-03-13 DIAGNOSIS — N3946 Mixed incontinence: Secondary | ICD-10-CM | POA: Diagnosis not present

## 2014-03-26 ENCOUNTER — Ambulatory Visit (INDEPENDENT_AMBULATORY_CARE_PROVIDER_SITE_OTHER): Payer: Medicare Other | Admitting: Surgery

## 2014-03-26 DIAGNOSIS — I119 Hypertensive heart disease without heart failure: Secondary | ICD-10-CM | POA: Diagnosis not present

## 2014-03-26 DIAGNOSIS — G609 Hereditary and idiopathic neuropathy, unspecified: Secondary | ICD-10-CM | POA: Diagnosis not present

## 2014-03-26 DIAGNOSIS — Z5181 Encounter for therapeutic drug level monitoring: Secondary | ICD-10-CM

## 2014-03-26 DIAGNOSIS — E559 Vitamin D deficiency, unspecified: Secondary | ICD-10-CM | POA: Diagnosis not present

## 2014-03-26 DIAGNOSIS — R7309 Other abnormal glucose: Secondary | ICD-10-CM | POA: Diagnosis not present

## 2014-03-26 DIAGNOSIS — R209 Unspecified disturbances of skin sensation: Secondary | ICD-10-CM | POA: Diagnosis not present

## 2014-03-26 DIAGNOSIS — Z7901 Long term (current) use of anticoagulants: Secondary | ICD-10-CM | POA: Diagnosis not present

## 2014-03-26 DIAGNOSIS — Z79899 Other long term (current) drug therapy: Secondary | ICD-10-CM | POA: Diagnosis not present

## 2014-03-26 DIAGNOSIS — M545 Low back pain, unspecified: Secondary | ICD-10-CM | POA: Diagnosis not present

## 2014-03-26 DIAGNOSIS — I4891 Unspecified atrial fibrillation: Secondary | ICD-10-CM | POA: Diagnosis not present

## 2014-03-26 DIAGNOSIS — I482 Chronic atrial fibrillation, unspecified: Secondary | ICD-10-CM

## 2014-03-26 LAB — POCT INR: INR: 1.9

## 2014-03-27 ENCOUNTER — Other Ambulatory Visit: Payer: Self-pay | Admitting: *Deleted

## 2014-03-27 MED ORDER — DILTIAZEM HCL ER COATED BEADS 120 MG PO CP24: ORAL_CAPSULE | ORAL | Status: DC

## 2014-03-28 LAB — NEUROPATHY PANEL
A/G Ratio: 1.3 (ref 0.7–2.0)
ALBUMIN ELP: 3.8 g/dL (ref 3.2–5.6)
ALPHA 1: 0.2 g/dL (ref 0.1–0.4)
ALPHA 2: 0.9 g/dL (ref 0.4–1.2)
ANA: NEGATIVE
Angio Convert Enzyme: <14 U/L — ABNORMAL LOW (ref 14–82)
BETA: 1.2 g/dL (ref 0.6–1.3)
Gamma Globulin: 0.8 g/dL (ref 0.5–1.6)
Globulin, Total: 3 g/dL (ref 2.0–4.5)
RHEUMATOID FACTOR: 11.2 [IU]/mL (ref 0.0–13.9)
Sed Rate: 11 mm/hr (ref 0–40)
TOTAL PROTEIN: 6.8 g/dL (ref 6.0–8.5)
TSH: 1.17 u[IU]/mL (ref 0.450–4.500)
VIT D 25 HYDROXY: 34.4 ng/mL (ref 30.0–100.0)
Vitamin B-12: 501 pg/mL (ref 211–946)

## 2014-03-28 LAB — HEMOGLOBIN A1C
Est. average glucose Bld gHb Est-mCnc: 123 mg/dL
HEMOGLOBIN A1C: 5.9 % — AB (ref 4.8–5.6)

## 2014-04-08 DIAGNOSIS — M5030 Other cervical disc degeneration, unspecified cervical region: Secondary | ICD-10-CM | POA: Diagnosis not present

## 2014-04-08 DIAGNOSIS — G894 Chronic pain syndrome: Secondary | ICD-10-CM | POA: Diagnosis not present

## 2014-04-08 DIAGNOSIS — M5137 Other intervertebral disc degeneration, lumbosacral region: Secondary | ICD-10-CM | POA: Diagnosis not present

## 2014-04-24 ENCOUNTER — Ambulatory Visit (INDEPENDENT_AMBULATORY_CARE_PROVIDER_SITE_OTHER): Payer: Medicare Other | Admitting: *Deleted

## 2014-04-24 DIAGNOSIS — I4891 Unspecified atrial fibrillation: Secondary | ICD-10-CM | POA: Diagnosis not present

## 2014-04-24 DIAGNOSIS — Z5181 Encounter for therapeutic drug level monitoring: Secondary | ICD-10-CM | POA: Diagnosis not present

## 2014-04-24 DIAGNOSIS — K648 Other hemorrhoids: Secondary | ICD-10-CM | POA: Diagnosis not present

## 2014-04-24 DIAGNOSIS — I482 Chronic atrial fibrillation, unspecified: Secondary | ICD-10-CM

## 2014-04-24 DIAGNOSIS — Z7901 Long term (current) use of anticoagulants: Secondary | ICD-10-CM

## 2014-04-24 DIAGNOSIS — Z1211 Encounter for screening for malignant neoplasm of colon: Secondary | ICD-10-CM | POA: Diagnosis not present

## 2014-04-24 LAB — POCT INR: INR: 3

## 2014-04-28 ENCOUNTER — Telehealth: Payer: Self-pay | Admitting: *Deleted

## 2014-04-28 NOTE — Telephone Encounter (Signed)
Pt was returning Sandy's call, states she mailed in a letter requesting Provider change and has asked to schedule to see Dr. Jannifer Franklin. Please call pt back concerning this matter. Thanks

## 2014-04-29 DIAGNOSIS — L819 Disorder of pigmentation, unspecified: Secondary | ICD-10-CM | POA: Diagnosis not present

## 2014-04-29 DIAGNOSIS — L570 Actinic keratosis: Secondary | ICD-10-CM | POA: Diagnosis not present

## 2014-04-29 DIAGNOSIS — L821 Other seborrheic keratosis: Secondary | ICD-10-CM | POA: Diagnosis not present

## 2014-04-30 NOTE — Telephone Encounter (Signed)
I called and spoke to pt.

## 2014-05-02 ENCOUNTER — Telehealth: Payer: Self-pay | Admitting: Diagnostic Neuroimaging

## 2014-05-02 NOTE — Telephone Encounter (Signed)
Pt calling requesting lab work results from 01/15/14. Dr. Leta Baptist out of the office, sending to [facility], Dr. Krista Blue, please advise.

## 2014-05-02 NOTE — Telephone Encounter (Signed)
I called patient with labs. A1c is borderline elevated. Otherwise unremarkable. She will follow up with me or NP in Sept 2015. She apologized to me for her behavior recently.  Penni Bombard, MD 1/47/8295, 6:21 PM Certified in Neurology, Neurophysiology and Neuroimaging  [facility] [address] [place], [place] [address] [phone]

## 2014-05-02 NOTE — Telephone Encounter (Signed)
Pt called states she needs someone to call her back concerning her lab results that were done at her last visit 01/15/14. Pt states no one called her and would like for Dr. Jearld Lesch nurse to call her back concerning this matter. Thanks

## 2014-05-16 DIAGNOSIS — L259 Unspecified contact dermatitis, unspecified cause: Secondary | ICD-10-CM | POA: Diagnosis not present

## 2014-05-21 ENCOUNTER — Ambulatory Visit: Payer: Medicare Other | Admitting: Pharmacist Clinician (PhC)/ Clinical Pharmacy Specialist

## 2014-05-24 DIAGNOSIS — L820 Inflamed seborrheic keratosis: Secondary | ICD-10-CM | POA: Diagnosis not present

## 2014-05-27 ENCOUNTER — Ambulatory Visit (INDEPENDENT_AMBULATORY_CARE_PROVIDER_SITE_OTHER): Payer: Medicare Other | Admitting: Pharmacist Clinician (PhC)/ Clinical Pharmacy Specialist

## 2014-05-27 DIAGNOSIS — I4891 Unspecified atrial fibrillation: Secondary | ICD-10-CM | POA: Diagnosis not present

## 2014-05-27 DIAGNOSIS — Z7901 Long term (current) use of anticoagulants: Secondary | ICD-10-CM | POA: Diagnosis not present

## 2014-05-27 DIAGNOSIS — I482 Chronic atrial fibrillation, unspecified: Secondary | ICD-10-CM

## 2014-05-27 DIAGNOSIS — Z5181 Encounter for therapeutic drug level monitoring: Secondary | ICD-10-CM | POA: Diagnosis not present

## 2014-05-27 LAB — POCT INR: INR: 3

## 2014-06-26 ENCOUNTER — Ambulatory Visit (INDEPENDENT_AMBULATORY_CARE_PROVIDER_SITE_OTHER): Payer: Medicare Other | Admitting: *Deleted

## 2014-06-26 ENCOUNTER — Other Ambulatory Visit: Payer: Self-pay | Admitting: Cardiology

## 2014-06-26 DIAGNOSIS — I4891 Unspecified atrial fibrillation: Secondary | ICD-10-CM

## 2014-06-26 DIAGNOSIS — M5137 Other intervertebral disc degeneration, lumbosacral region: Secondary | ICD-10-CM | POA: Diagnosis not present

## 2014-06-26 DIAGNOSIS — Z5181 Encounter for therapeutic drug level monitoring: Secondary | ICD-10-CM

## 2014-06-26 DIAGNOSIS — Z7901 Long term (current) use of anticoagulants: Secondary | ICD-10-CM | POA: Diagnosis not present

## 2014-06-26 DIAGNOSIS — I482 Chronic atrial fibrillation, unspecified: Secondary | ICD-10-CM

## 2014-06-26 DIAGNOSIS — M5030 Other cervical disc degeneration, unspecified cervical region: Secondary | ICD-10-CM | POA: Diagnosis not present

## 2014-06-26 DIAGNOSIS — G894 Chronic pain syndrome: Secondary | ICD-10-CM | POA: Diagnosis not present

## 2014-06-26 LAB — POCT INR: INR: 2.7

## 2014-07-08 ENCOUNTER — Ambulatory Visit (INDEPENDENT_AMBULATORY_CARE_PROVIDER_SITE_OTHER): Payer: Medicare Other | Admitting: Diagnostic Neuroimaging

## 2014-07-08 ENCOUNTER — Encounter: Payer: Self-pay | Admitting: Diagnostic Neuroimaging

## 2014-07-08 VITALS — BP 146/95 | HR 69 | Ht 64.0 in | Wt 165.8 lb

## 2014-07-08 DIAGNOSIS — M545 Low back pain, unspecified: Secondary | ICD-10-CM

## 2014-07-08 DIAGNOSIS — G609 Hereditary and idiopathic neuropathy, unspecified: Secondary | ICD-10-CM | POA: Diagnosis not present

## 2014-07-08 DIAGNOSIS — R209 Unspecified disturbances of skin sensation: Secondary | ICD-10-CM

## 2014-07-08 DIAGNOSIS — R200 Anesthesia of skin: Secondary | ICD-10-CM

## 2014-07-08 NOTE — Patient Instructions (Signed)
Follow up with Dr. Nelva Bush.

## 2014-07-08 NOTE — Progress Notes (Signed)
[facility]  PATIENT: [name] DOB: 13-Dec-1931  REFERRING CLINICIAN: Ramos HISTORY FROM: patient  REASON FOR VISIT: follow up   HISTORICAL  CHIEF COMPLAINT:  Chief Complaint  Patient presents with  . Follow-up    HISTORY OF PRESENT ILLNESS:   UPDATE 07/08/14: Since last visit, doing well. Still with some numbness in toes. Has noted some muscle atrophy of left calf (slowly over many years). No sig pain in feet.   PRIOR HPI (01/15/14): 80 year old right-handed female with history of atrial fibrillation, hypertension, hypercholesteremia, anxiety, here for evaluation of lower extremity numbness in feet and toes. Since 2012 patient has had numbness and tingling sensation in her feet and toes. Symptoms worsen left than right foot. Now numbness is spreading up to her ankles. Patient also has chronic low back pain from 1990s, mainly in the lower back and buttock region. Patient also has noted some blue discoloration of her toes, particularly in early morning, changing to a bright red color later in the day. Cold or hot temperature does not seem to affect this. She also has some cramps in her thighs. Patient had bilateral lower extremity arterial ultrasound in April 2014 which was unremarkable. Interestingly, patient does not recall having this test done. Also during her conversations, patient tends to repeat herself with some items but is somewhat tangential in other areas.  REVIEW OF SYSTEMS: Full 14 system review of systems performed and notable only for easy bruising anemia moles.    ALLERGIES: Allergies  Allergen Reactions  . Codeine Nausea And Vomiting  . Penicillins   . Sulfonamide Derivatives Rash    HOME MEDICATIONS: Outpatient Prescriptions Prior to Visit  Medication Sig Dispense Refill  . Ascorbic Acid (VITAMIN C) 1000 MG tablet Take 1,000 mg by mouth daily.        Marland Kitchen atorvastatin (LIPITOR) 20 MG tablet Take 1 tablet by mouth Daily.      .  Calcium Carbonate-Vitamin D (CALCIUM + D PO) Take by mouth daily.        . Cholecalciferol (VITAMIN D PO) Take by mouth daily.        . Cyanocobalamin (VITAMIN B-12 PO) Take by mouth as directed.      . diltiazem (CARDIZEM CD) 120 MG 24 hr capsule TAKE ONE CAPSULE BY MOUTH EVERY DAY  30 capsule  5  . estrogens, conjugated, (PREMARIN) 1.25 MG tablet Take 1.25 mg by mouth daily.        . furosemide (LASIX) 40 MG tablet Take 2 tablets in the am and 1 tablet in the pm  90 tablet  6  . gabapentin (NEURONTIN) 100 MG capsule Take 1 tablet by mouth 2 (two) times daily.       Marland Kitchen HYDROcodone-acetaminophen (NORCO) 10-325 MG per tablet Take 1 tablet by mouth every 6 (six) hours as needed.      Marland Kitchen lisinopril (PRINIVIL,ZESTRIL) 20 MG tablet Take 1 tablet by mouth Daily.      . Magnesium 400 MG CAPS Take by mouth daily.        . Multiple Vitamins-Minerals (ZINC PO) Take by mouth daily.        Marland Kitchen PARoxetine (PAXIL-CR) 37.5 MG 24 hr tablet Take 37.5 mg by mouth every morning.        . Potassium (POTASSIMIN PO) Take 595 mg by mouth daily. 3 TABLETS DAILY      . valACYclovir (VALTREX) 500 MG tablet       . vitamin A 8000 UNIT capsule Take 8,000  Units by mouth daily.        Marland Kitchen warfarin (COUMADIN) 5 MG tablet TAKE AS DIRECTED BY COUMADIN CLINIC  30 tablet  3  . mirabegron ER (MYRBETRIQ) 25 MG TB24 tablet Take 25 mg by mouth daily.      Marland Kitchen triamcinolone cream (KENALOG) 0.1 % Apply as directed       No facility-administered medications prior to visit.    PAST MEDICAL HISTORY: Past Medical History  Diagnosis Date  . Mitral insufficiency     CHRONIC  . Hypertensive heart disease     WITH LVH  . LVH (left ventricular hypertrophy) due to hypertensive disease   . Chronic atrial fibrillation   . Pulmonary hypertension   . Hypercholesterolemia   . Back pain, chronic   . Depression   . Coronary artery disease CARDIOLOGIST- DR [name]--  LOV IN EPIC  . Anticoagulated on Coumadin     CHRONIC  . Arthritis   . Anemia    . Hypertension   . Hammer toe   . Transfusion history     PAST SURGICAL HISTORY: Past Surgical History  Procedure Laterality Date  . Total hip arthroplasty      BILATERAL---  LEFT 4/98;   RIGHT  11/98  . Abdominal hysterectomy    . Varus osteotomy proximal phalanx, left great toe/ paring down the callus , second left toe  10-27-2008  . Repair right second claw toe/ varus medial closing wedge osteotomy proximal phalanx right great toe  05-05-2005  . Anterior repair/ sparc pubovaginal sling  05-03-2002  . Pars plana vitrectomy w/ repair of macular hole  02-04-2002    LEFT EYE  . Cardiac catheterization  02-03-2009  DR  [name]    SINGLE-VESSEL OBSTRUCTIVE CAD, SMALL DIAGONAL BRANCH/ NORMAL LVF/ SEVERE MITRAL INSUFFICIENCY/ SEVERE PULMONARY HYPERTENSION  . Cataract extraction w/ intraocular lens  implant, bilateral  2003  . Hammer toe surgery  02/28/2012    Procedure: HAMMER TOE CORRECTION;  Surgeon: [name], MD;  Location: [facility];  Service: Orthopedics;  Laterality: Right;  FUSION OF PROXIMAL INTERPHALANGEAL  RIGHT THIRD CLAW TOE  . Bunionectomy with hammertoe reconstruction Right 03/01/2013    Procedure: RIGHT FOOT EXCISION BUNIONETTE AND FUSION OF DIP FOURTH TOE;  Surgeon: [name], MD;  Location: [place];  Service: Orthopedics;  Laterality: Right;    FAMILY HISTORY: Family History  Problem Relation Age of Onset  . Heart disease Mother 59  . Prostate cancer Father 46    SOCIAL HISTORY:  History   Social History  . Marital Status: Divorced    Spouse Name: N/A    Number of Children: 3  . Years of Education: College   Occupational History  . Retired    Social History Main Topics  . Smoking status: Never Smoker   . Smokeless tobacco: Never Used  . Alcohol Use: Yes     Comment: occasional  . Drug Use: No  . Sexual Activity: Not on file   Other Topics Concern  . Not on file   Social History Narrative   Patient  lives at home alone.   Caffeine Use: 1 cup daily occasionally     PHYSICAL EXAM  Filed Vitals:   07/08/14 0836  BP: 146/95  Pulse: 69  Height: _0  (1.626 m)  Weight: 165 lb 12.8 oz (75.206 kg)    Not recorded    Body mass index is 28.45 kg/(m^2).  GENERAL EXAM: Patient is in no  distress; well developed, nourished and groomed; neck is supple  CARDIOVASCULAR: Regular rate and rhythm, no murmurs, no carotid bruits; VARICOSE VEINS IN FEET/LEGS. EXTENSIVE ECCHYMOSES IN HANDS, ARMS, LEGS.   NEUROLOGIC: MENTAL STATUS: awake, alert, language fluent, comprehension intact, naming intact, fund of knowledge appropriate; PLEASANT.  CRANIAL NERVE: pupils equal and reactive to light, visual fields full to confrontation, extraocular muscles intact, no nystagmus, facial sensation and strength symmetric, hearing intact, palate elevates symmetrically, uvula midline, shoulder shrug symmetric, tongue midline. MOTOR: normal bulk and tone, full strength in the BUE, BLE; SLIGHTLY SMALLER LEFT CALF THAN RIGHT CALF SENSORY: normal and symmetric to light touch, ABSENT VIB AT TOES. DECR PP AND TEMP IN FEET. COORDINATION: finger-nose-finger, fine finger movements normal REFLEXES: BUE TRACE, KNEES TRACE, ANKLES ABSENT GAIT/STATION: narrow based gait; ABLE TO TOE, HEEL AND TANDEM, BUT HAS TO HOLD ON TO THE WALL. ROMBERG NEG.    DIAGNOSTIC DATA (LABS, IMAGING, TESTING) - I reviewed patient records, labs, notes, testing and imaging myself where available.  Lab Results  Component Value Date   WBC 3.8* 01/26/2012   HGB 12.2 03/01/2013   HCT 36.0 03/01/2013   MCV 94.4 01/26/2012   PLT 225.0 01/26/2012      Component Value Date/Time   NA 141 03/01/2013 1100   K 5.0 03/01/2013 1100   CL 104 03/01/2013 1100   CO2 28 02/23/2012 1155   GLUCOSE 102* 03/01/2013 1100   BUN 10 03/01/2013 1100   CREATININE 0.90 03/01/2013 1100   CALCIUM 9.1 02/23/2012 1155   PROT 6.8 03/26/2014 1002   PROT 6.8 01/26/2012 0924    ALBUMIN 3.5 01/26/2012 0924   AST 22 01/26/2012 0924   ALT 15 01/26/2012 0924   ALKPHOS 64 01/26/2012 0924   BILITOT 0.3 01/26/2012 0924   GFRNONAA >60 10/27/2008 1110   GFRAA  Value: >60        The eGFR has been calculated using the MDRD equation. This calculation has not been validated in all clinical 10/27/2008 1110   Lab Results  Component Value Date   CHOL 157 01/26/2012   HDL 70.10 01/26/2012   LDLCALC 69 01/26/2012   TRIG 91.0 01/26/2012   CHOLHDL 2 01/26/2012   Lab Results  Component Value Date   HGBA1C 5.9* 03/26/2014   Lab Results  Component Value Date   VITAMINB12 501 03/26/2014   Lab Results  Component Value Date   TSH 1.170 03/26/2014    02/21/13 BLE arterial u/s - normal ABIs, no evidence of lower ext arterial disease     ASSESSMENT AND PLAN  80 y.o. year old female here with  numbness in feet since 2012. Suspect small fiber peripheral neuropathy (idiopathic vs pre-diabetic) + lumbar radiculopathy. A1c was 6.4 but has improved to 5.9.   PLAN: - continue pain mgmt per Dr. Nelva Bush - consider repeat MRI lumbar spine; I offered to order this but patient would like this to be performed at Dr. Nelva Bush office, and she requested that I check with him to arrange it  Return if symptoms worsen or fail to improve, for return to Dr. Nelva Bush.    Penni Bombard, MD 06/09/6628, 4:76 AM Certified in Neurology, Neurophysiology and Neuroimaging  [facility] [address] [place], [place] [address] [phone]

## 2014-07-09 ENCOUNTER — Telehealth: Payer: Self-pay | Admitting: Cardiology

## 2014-07-09 NOTE — Telephone Encounter (Signed)
Close encounter 

## 2014-07-22 ENCOUNTER — Ambulatory Visit (INDEPENDENT_AMBULATORY_CARE_PROVIDER_SITE_OTHER): Payer: Medicare Other | Admitting: *Deleted

## 2014-07-22 ENCOUNTER — Ambulatory Visit: Payer: Medicare Other | Admitting: *Deleted

## 2014-07-22 DIAGNOSIS — I4891 Unspecified atrial fibrillation: Secondary | ICD-10-CM | POA: Diagnosis not present

## 2014-07-22 DIAGNOSIS — Z5181 Encounter for therapeutic drug level monitoring: Secondary | ICD-10-CM

## 2014-07-22 DIAGNOSIS — D518 Other vitamin B12 deficiency anemias: Secondary | ICD-10-CM | POA: Diagnosis not present

## 2014-07-22 DIAGNOSIS — Z0000 Encounter for general adult medical examination without abnormal findings: Secondary | ICD-10-CM | POA: Diagnosis not present

## 2014-07-22 DIAGNOSIS — Z7901 Long term (current) use of anticoagulants: Secondary | ICD-10-CM

## 2014-07-22 DIAGNOSIS — I482 Chronic atrial fibrillation, unspecified: Secondary | ICD-10-CM

## 2014-07-22 DIAGNOSIS — E559 Vitamin D deficiency, unspecified: Secondary | ICD-10-CM | POA: Diagnosis not present

## 2014-07-22 DIAGNOSIS — Z79899 Other long term (current) drug therapy: Secondary | ICD-10-CM | POA: Diagnosis not present

## 2014-07-22 DIAGNOSIS — E785 Hyperlipidemia, unspecified: Secondary | ICD-10-CM | POA: Diagnosis not present

## 2014-07-22 DIAGNOSIS — I119 Hypertensive heart disease without heart failure: Secondary | ICD-10-CM | POA: Diagnosis not present

## 2014-07-22 DIAGNOSIS — Z23 Encounter for immunization: Secondary | ICD-10-CM | POA: Diagnosis not present

## 2014-07-22 DIAGNOSIS — R7309 Other abnormal glucose: Secondary | ICD-10-CM | POA: Diagnosis not present

## 2014-07-22 DIAGNOSIS — R252 Cramp and spasm: Secondary | ICD-10-CM | POA: Diagnosis not present

## 2014-07-22 LAB — POCT INR: INR: 3.4

## 2014-07-25 ENCOUNTER — Ambulatory Visit: Payer: Medicare Other | Admitting: Diagnostic Neuroimaging

## 2014-07-25 DIAGNOSIS — K5730 Diverticulosis of large intestine without perforation or abscess without bleeding: Secondary | ICD-10-CM | POA: Diagnosis not present

## 2014-07-25 DIAGNOSIS — Z1211 Encounter for screening for malignant neoplasm of colon: Secondary | ICD-10-CM | POA: Diagnosis not present

## 2014-07-25 HISTORY — PX: COLONOSCOPY: SHX174

## 2014-07-28 ENCOUNTER — Other Ambulatory Visit: Payer: Self-pay

## 2014-07-28 ENCOUNTER — Other Ambulatory Visit: Payer: Self-pay | Admitting: *Deleted

## 2014-07-28 MED ORDER — FUROSEMIDE 40 MG PO TABS: ORAL_TABLET | ORAL | Status: DC

## 2014-07-29 MED ORDER — WARFARIN SODIUM 5 MG PO TABS: ORAL_TABLET | ORAL | Status: DC

## 2014-08-20 ENCOUNTER — Ambulatory Visit: Payer: Medicare Other | Admitting: Pharmacist Clinician (PhC)/ Clinical Pharmacy Specialist

## 2014-08-28 ENCOUNTER — Encounter: Payer: Self-pay | Admitting: Cardiology

## 2014-08-28 ENCOUNTER — Ambulatory Visit (INDEPENDENT_AMBULATORY_CARE_PROVIDER_SITE_OTHER): Payer: Medicare Other | Admitting: Cardiology

## 2014-08-28 ENCOUNTER — Ambulatory Visit (INDEPENDENT_AMBULATORY_CARE_PROVIDER_SITE_OTHER): Payer: Medicare Other | Admitting: *Deleted

## 2014-08-28 VITALS — BP 163/93 | HR 75 | Ht 65.0 in | Wt 163.5 lb

## 2014-08-28 DIAGNOSIS — I270 Primary pulmonary hypertension: Secondary | ICD-10-CM

## 2014-08-28 DIAGNOSIS — I340 Nonrheumatic mitral (valve) insufficiency: Secondary | ICD-10-CM | POA: Diagnosis not present

## 2014-08-28 DIAGNOSIS — I119 Hypertensive heart disease without heart failure: Secondary | ICD-10-CM | POA: Diagnosis not present

## 2014-08-28 DIAGNOSIS — I10 Essential (primary) hypertension: Secondary | ICD-10-CM

## 2014-08-28 DIAGNOSIS — I4891 Unspecified atrial fibrillation: Secondary | ICD-10-CM | POA: Diagnosis not present

## 2014-08-28 DIAGNOSIS — I482 Chronic atrial fibrillation, unspecified: Secondary | ICD-10-CM

## 2014-08-28 DIAGNOSIS — Z7901 Long term (current) use of anticoagulants: Secondary | ICD-10-CM | POA: Diagnosis not present

## 2014-08-28 DIAGNOSIS — Z1231 Encounter for screening mammogram for malignant neoplasm of breast: Secondary | ICD-10-CM | POA: Diagnosis not present

## 2014-08-28 DIAGNOSIS — Z5181 Encounter for therapeutic drug level monitoring: Secondary | ICD-10-CM

## 2014-08-28 DIAGNOSIS — I272 Pulmonary hypertension, unspecified: Secondary | ICD-10-CM

## 2014-08-28 LAB — POCT INR: INR: 2.9

## 2014-08-28 NOTE — Patient Instructions (Signed)
We will get a copy of your lab work from Dr. Karlton Lemon  Continue your current therapy  I will see you in 6 months.

## 2014-08-28 NOTE — Progress Notes (Signed)
[name] Date of Birth: 11-03-1932   History of Present Illness: [name] is seen today for followup. She has a history of chronic atrial fibrillation, hypertensive heart disease, and pulmonary hypertension. She has a history of chronic back problems. She reports she had a normal colonoscopy last month that was normal. She has continued to have loose stools. Reduced potassium supplement and stopped magnesium with improvement. She is walking daily with mild SOB. No cough. No chest pain.  Current Outpatient Prescriptions on File Prior to Visit  Medication Sig Dispense Refill  . Ascorbic Acid (VITAMIN C) 1000 MG tablet Take 1,000 mg by mouth daily.        Marland Kitchen atorvastatin (LIPITOR) 20 MG tablet Take 1 tablet by mouth Daily.      . Calcium Carbonate-Vitamin D (CALCIUM + D PO) Take by mouth daily.        . Cholecalciferol (VITAMIN D PO) Take by mouth daily.        . Cyanocobalamin (VITAMIN B-12 PO) Take by mouth as directed.      . diltiazem (CARDIZEM CD) 120 MG 24 hr capsule TAKE ONE CAPSULE BY MOUTH EVERY DAY  30 capsule  5  . estrogens, conjugated, (PREMARIN) 1.25 MG tablet Take 1.25 mg by mouth daily.        . furosemide (LASIX) 40 MG tablet Take 2 tablets in the am and 1 tablet in the pm  90 tablet  6  . gabapentin (NEURONTIN) 100 MG capsule Take 1 tablet by mouth 2 (two) times daily.       Marland Kitchen HYDROcodone-acetaminophen (NORCO) 10-325 MG per tablet Take 1 tablet by mouth every 6 (six) hours as needed.      Marland Kitchen lisinopril (PRINIVIL,ZESTRIL) 20 MG tablet Take 1 tablet by mouth Daily.      . Multiple Vitamins-Minerals (ZINC PO) Take by mouth daily.        . Potassium (POTASSIMIN PO) Take 595 mg by mouth daily. 1 Tablet daily      . valACYclovir (VALTREX) 500 MG tablet       . vitamin A 8000 UNIT capsule Take 8,000 Units by mouth daily.        Marland Kitchen warfarin (COUMADIN) 5 MG tablet Take 1 tablet by mouth daily or as directed by coumadin clinic  30 tablet  3  . [DISCONTINUED] diltiazem  (CARDIZEM) 120 MG tablet Take 1 tablet (120 mg total) by mouth daily.  30 tablet  5   No current facility-administered medications on file prior to visit.    Allergies  Allergen Reactions  . Codeine Nausea And Vomiting  . Penicillins   . Sulfonamide Derivatives Rash    Past Medical History  Diagnosis Date  . Mitral insufficiency     CHRONIC  . Hypertensive heart disease     WITH LVH  . LVH (left ventricular hypertrophy) due to hypertensive disease   . Chronic atrial fibrillation   . Pulmonary hypertension   . Hypercholesterolemia   . Back pain, chronic   . Depression   . Coronary artery disease CARDIOLOGIST- DR [name]--  LOV IN EPIC  . Anticoagulated on Coumadin     CHRONIC  . Arthritis   . Anemia   . Hypertension   . Hammer toe   . Transfusion history     Past Surgical History  Procedure Laterality Date  . Total hip arthroplasty      BILATERAL---  LEFT 4/98;   RIGHT  11/98  . Abdominal hysterectomy    .  Varus osteotomy proximal phalanx, left great toe/ paring down the callus , second left toe  10-27-2008  . Repair right second claw toe/ varus medial closing wedge osteotomy proximal phalanx right great toe  05-05-2005  . Anterior repair/ sparc pubovaginal sling  05-03-2002  . Pars plana vitrectomy w/ repair of macular hole  02-04-2002    LEFT EYE  . Cardiac catheterization  02-03-2009  DR  [name]    SINGLE-VESSEL OBSTRUCTIVE CAD, SMALL DIAGONAL BRANCH/ NORMAL LVF/ SEVERE MITRAL INSUFFICIENCY/ SEVERE PULMONARY HYPERTENSION  . Cataract extraction w/ intraocular lens  implant, bilateral  2003  . Hammer toe surgery  02/28/2012    Procedure: HAMMER TOE CORRECTION;  Surgeon: [name], MD;  Location: [facility];  Service: Orthopedics;  Laterality: Right;  FUSION OF PROXIMAL INTERPHALANGEAL  RIGHT THIRD CLAW TOE  . Bunionectomy with hammertoe reconstruction Right 03/01/2013    Procedure: RIGHT FOOT EXCISION BUNIONETTE AND FUSION OF DIP FOURTH TOE;   Surgeon: [name], MD;  Location: [place];  Service: Orthopedics;  Laterality: Right;    History  Smoking status  . Never Smoker   Smokeless tobacco  . Never Used    History  Alcohol Use  . Yes    Comment: occasional    Family History  Problem Relation Age of Onset  . Heart disease Mother 33  . Prostate cancer Father 28    Review of Systems:  As noted in history of present illness.  All other systems were reviewed and are negative.  Physical Exam: BP 163/93  Pulse 75  Ht 5\' 5"  (1.651 m)  Wt 163 lb 8 oz (74.163 kg)  BMI 27.21 kg/m2 She is a pleasant white female in no acute distress. Her HEENT exam is unremarkable. Sclera are clear. Oropharynx is clear. Neck is supple without JVD, adenopathy, thyromegaly, or bruits. Lungs are clear. Cardiac exam reveals a regular rate and rhythm without gallop. There is a grade 2/6 systolic murmur heard best at the left sternal border. Abdomen is soft and nontender. She has no significant edema. DP and PT pulses are palpable. She does have fairly extensive venous varicosities with some dependent bluish discoloration of her toes.  She is alert and oriented x3. Cranial nerves II through XII are intact.  LABORATORY DATA: INR today is 2.9  Ecg today shows atrial fibrillation with rate 75 bpm. Low voltage. I have personally reviewed and interpreted this study.      Assessment / Plan: 1. Atrial fibrillation, permanent. Rate is well controlled on diltiazem. She is anticoagulated on Coumadin.   2. Mitral insufficiency, moderate. Asymptomatic.   3. Hypertension with hypertensive heart disease, well controlled.  4. Hyperlipidemia on statin therapy.   5. Pulmonary hypertension-moderate  6. Coronary disease. Cardiac catheterization in 2010 showed severe stenosis in a small diagonal branch. She otherwise had no significant disease. She is asymptomatic.

## 2014-09-01 DIAGNOSIS — R233 Spontaneous ecchymoses: Secondary | ICD-10-CM | POA: Diagnosis not present

## 2014-09-01 DIAGNOSIS — L821 Other seborrheic keratosis: Secondary | ICD-10-CM | POA: Diagnosis not present

## 2014-09-18 DIAGNOSIS — M5136 Other intervertebral disc degeneration, lumbar region: Secondary | ICD-10-CM | POA: Diagnosis not present

## 2014-09-18 DIAGNOSIS — Z79891 Long term (current) use of opiate analgesic: Secondary | ICD-10-CM | POA: Diagnosis not present

## 2014-09-18 DIAGNOSIS — H35373 Puckering of macula, bilateral: Secondary | ICD-10-CM | POA: Diagnosis not present

## 2014-09-18 DIAGNOSIS — G894 Chronic pain syndrome: Secondary | ICD-10-CM | POA: Diagnosis not present

## 2014-09-18 DIAGNOSIS — H3531 Nonexudative age-related macular degeneration: Secondary | ICD-10-CM | POA: Diagnosis not present

## 2014-09-18 DIAGNOSIS — M5030 Other cervical disc degeneration, unspecified cervical region: Secondary | ICD-10-CM | POA: Diagnosis not present

## 2014-09-18 DIAGNOSIS — H40013 Open angle with borderline findings, low risk, bilateral: Secondary | ICD-10-CM | POA: Diagnosis not present

## 2014-09-18 DIAGNOSIS — H5711 Ocular pain, right eye: Secondary | ICD-10-CM | POA: Diagnosis not present

## 2014-09-24 ENCOUNTER — Ambulatory Visit: Payer: Medicare Other

## 2014-09-25 ENCOUNTER — Ambulatory Visit (INDEPENDENT_AMBULATORY_CARE_PROVIDER_SITE_OTHER): Payer: Medicare Other | Admitting: *Deleted

## 2014-09-25 DIAGNOSIS — I482 Chronic atrial fibrillation, unspecified: Secondary | ICD-10-CM

## 2014-09-25 DIAGNOSIS — I4891 Unspecified atrial fibrillation: Secondary | ICD-10-CM | POA: Diagnosis not present

## 2014-09-25 DIAGNOSIS — Z7901 Long term (current) use of anticoagulants: Secondary | ICD-10-CM | POA: Diagnosis not present

## 2014-09-25 DIAGNOSIS — Z5181 Encounter for therapeutic drug level monitoring: Secondary | ICD-10-CM

## 2014-09-25 LAB — POCT INR: INR: 2.1

## 2014-09-25 NOTE — Patient Instructions (Signed)
Your physician recommends that you schedule a follow-up appointment in: 4 weeks with Cyril Mourning

## 2014-09-26 ENCOUNTER — Other Ambulatory Visit: Payer: Self-pay

## 2014-09-26 MED ORDER — DILTIAZEM HCL ER COATED BEADS 120 MG PO CP24: ORAL_CAPSULE | ORAL | Status: DC

## 2014-09-30 ENCOUNTER — Other Ambulatory Visit: Payer: Self-pay | Admitting: Obstetrics and Gynecology

## 2014-09-30 DIAGNOSIS — L820 Inflamed seborrheic keratosis: Secondary | ICD-10-CM | POA: Diagnosis not present

## 2014-09-30 DIAGNOSIS — L853 Xerosis cutis: Secondary | ICD-10-CM | POA: Diagnosis not present

## 2014-09-30 DIAGNOSIS — R5381 Other malaise: Secondary | ICD-10-CM

## 2014-09-30 DIAGNOSIS — M8580 Other specified disorders of bone density and structure, unspecified site: Secondary | ICD-10-CM

## 2014-10-21 ENCOUNTER — Telehealth: Payer: Self-pay | Admitting: Cardiology

## 2014-10-21 NOTE — Telephone Encounter (Signed)
Returned call to patient she stated for the past 2 months she notices her breathing is labored after she walks.No chest pain.Stated she would like to see Dr.Jordan 11/27/13 she has another appointment that day.Appointment scheduled with Dr.Jordan 11/27/14 at 11:45 am.Advised to call sooner if needed.

## 2014-10-21 NOTE — Telephone Encounter (Signed)
[name] is calling because her breathing is a little more labored when she is walking and would like to come in to see Dr. [name] . Offered her to see a PA , she does not want that. Please call    Thanks

## 2014-10-22 ENCOUNTER — Other Ambulatory Visit: Payer: Medicare Other

## 2014-10-22 ENCOUNTER — Ambulatory Visit (INDEPENDENT_AMBULATORY_CARE_PROVIDER_SITE_OTHER): Payer: Medicare Other | Admitting: Pharmacist Clinician (PhC)/ Clinical Pharmacy Specialist

## 2014-10-22 DIAGNOSIS — I482 Chronic atrial fibrillation, unspecified: Secondary | ICD-10-CM

## 2014-10-22 DIAGNOSIS — I4891 Unspecified atrial fibrillation: Secondary | ICD-10-CM

## 2014-10-22 DIAGNOSIS — Z5181 Encounter for therapeutic drug level monitoring: Secondary | ICD-10-CM

## 2014-10-22 DIAGNOSIS — Z7901 Long term (current) use of anticoagulants: Secondary | ICD-10-CM

## 2014-10-22 LAB — POCT INR: INR: 2.3

## 2014-11-26 DIAGNOSIS — C44729 Squamous cell carcinoma of skin of left lower limb, including hip: Secondary | ICD-10-CM | POA: Diagnosis not present

## 2014-11-27 ENCOUNTER — Ambulatory Visit (INDEPENDENT_AMBULATORY_CARE_PROVIDER_SITE_OTHER): Payer: Medicare Other | Admitting: Pharmacist Clinician (PhC)/ Clinical Pharmacy Specialist

## 2014-11-27 ENCOUNTER — Encounter: Payer: Self-pay | Admitting: Cardiology

## 2014-11-27 ENCOUNTER — Ambulatory Visit (INDEPENDENT_AMBULATORY_CARE_PROVIDER_SITE_OTHER): Payer: Medicare Other | Admitting: Cardiology

## 2014-11-27 ENCOUNTER — Encounter (INDEPENDENT_AMBULATORY_CARE_PROVIDER_SITE_OTHER): Payer: Medicare Other | Admitting: *Deleted

## 2014-11-27 VITALS — BP 132/74 | HR 84 | Ht 65.0 in | Wt 163.2 lb

## 2014-11-27 DIAGNOSIS — I482 Chronic atrial fibrillation, unspecified: Secondary | ICD-10-CM

## 2014-11-27 DIAGNOSIS — I4891 Unspecified atrial fibrillation: Secondary | ICD-10-CM | POA: Diagnosis not present

## 2014-11-27 DIAGNOSIS — Z7901 Long term (current) use of anticoagulants: Secondary | ICD-10-CM

## 2014-11-27 DIAGNOSIS — I270 Primary pulmonary hypertension: Secondary | ICD-10-CM

## 2014-11-27 DIAGNOSIS — Z471 Aftercare following joint replacement surgery: Secondary | ICD-10-CM | POA: Diagnosis not present

## 2014-11-27 DIAGNOSIS — I272 Pulmonary hypertension, unspecified: Secondary | ICD-10-CM

## 2014-11-27 DIAGNOSIS — I10 Essential (primary) hypertension: Secondary | ICD-10-CM | POA: Diagnosis not present

## 2014-11-27 DIAGNOSIS — Z5181 Encounter for therapeutic drug level monitoring: Secondary | ICD-10-CM

## 2014-11-27 DIAGNOSIS — Z96642 Presence of left artificial hip joint: Secondary | ICD-10-CM | POA: Diagnosis not present

## 2014-11-27 DIAGNOSIS — Z96641 Presence of right artificial hip joint: Secondary | ICD-10-CM | POA: Diagnosis not present

## 2014-11-27 LAB — POCT INR
INR: 2.3
INR: 2.3

## 2014-11-27 NOTE — Progress Notes (Signed)
[name] Date of Birth: 11/15/1931   History of Present Illness: [name] is seen as a work in for evaluation of dyspnea. She has a history of chronic atrial fibrillation, hypertensive heart disease, and pulmonary hypertension. She has a history of chronic back problems. She called last month with complaints of increasing dyspnea with exertion. Since then her breathing has improved significantly. She attributes this to the fact that the weather is much cooler now and she does better when the temperature is low. We did have unseasonably warm weather in Dec. And this seems to have affected her. No current cough, edema, PND, or orthopnea. She has had her flu shot and Prevnar 13.   Current Outpatient Prescriptions on File Prior to Visit  Medication Sig Dispense Refill  . Ascorbic Acid (VITAMIN C) 1000 MG tablet Take 1,000 mg by mouth daily.      Marland Kitchen atorvastatin (LIPITOR) 20 MG tablet Take 1 tablet by mouth Daily.    . Calcium Carbonate-Vitamin D (CALCIUM + D PO) Take by mouth daily.      . Cholecalciferol (VITAMIN D PO) Take by mouth daily.      . Cyanocobalamin (VITAMIN B-12 PO) Take by mouth as directed.    . diltiazem (CARDIZEM CD) 120 MG 24 hr capsule TAKE ONE CAPSULE BY MOUTH EVERY DAY 30 capsule 3  . estrogens, conjugated, (PREMARIN) 1.25 MG tablet Take 1.25 mg by mouth daily.      . furosemide (LASIX) 40 MG tablet Take 2 tablets in the am and 1 tablet in the pm 90 tablet 6  . gabapentin (NEURONTIN) 100 MG capsule Take 1 tablet by mouth 2 (two) times daily.     Marland Kitchen HYDROcodone-acetaminophen (NORCO) 10-325 MG per tablet Take 1 tablet by mouth every 6 (six) hours as needed.    Marland Kitchen lisinopril (PRINIVIL,ZESTRIL) 20 MG tablet Take 1 tablet by mouth Daily.    . Multiple Vitamins-Minerals (ZINC PO) Take by mouth daily.      . Potassium (POTASSIMIN PO) Take 595 mg by mouth daily. 1 Tablet daily    . valACYclovir (VALTREX) 500 MG tablet     . vitamin A 8000 UNIT capsule Take 8,000  Units by mouth daily.      Marland Kitchen warfarin (COUMADIN) 5 MG tablet Take 1 tablet by mouth daily or as directed by coumadin clinic 30 tablet 3  . [DISCONTINUED] diltiazem (CARDIZEM) 120 MG tablet Take 1 tablet (120 mg total) by mouth daily. 30 tablet 5   No current facility-administered medications on file prior to visit.    Allergies  Allergen Reactions  . Codeine Nausea And Vomiting  . Penicillins   . Sulfonamide Derivatives Rash    Past Medical History  Diagnosis Date  . Mitral insufficiency     CHRONIC  . Hypertensive heart disease     WITH LVH  . LVH (left ventricular hypertrophy) due to hypertensive disease   . Chronic atrial fibrillation   . Pulmonary hypertension   . Hypercholesterolemia   . Back pain, chronic   . Depression   . Coronary artery disease CARDIOLOGIST- DR [name]--  LOV IN EPIC  . Anticoagulated on Coumadin     CHRONIC  . Arthritis   . Anemia   . Hypertension   . Hammer toe   . Transfusion history     Past Surgical History  Procedure Laterality Date  . Total hip arthroplasty      BILATERAL---  LEFT 4/98;   RIGHT  11/98  .  Abdominal hysterectomy    . Varus osteotomy proximal phalanx, left great toe/ paring down the callus , second left toe  10-27-2008  . Repair right second claw toe/ varus medial closing wedge osteotomy proximal phalanx right great toe  05-05-2005  . Anterior repair/ sparc pubovaginal sling  05-03-2002  . Pars plana vitrectomy w/ repair of macular hole  02-04-2002    LEFT EYE  . Cardiac catheterization  02-03-2009  DR  [name]    SINGLE-VESSEL OBSTRUCTIVE CAD, SMALL DIAGONAL BRANCH/ NORMAL LVF/ SEVERE MITRAL INSUFFICIENCY/ SEVERE PULMONARY HYPERTENSION  . Cataract extraction w/ intraocular lens  implant, bilateral  2003  . Hammer toe surgery  02/28/2012    Procedure: HAMMER TOE CORRECTION;  Surgeon: [name], MD;  Location: [facility];  Service: Orthopedics;  Laterality: Right;  FUSION OF PROXIMAL INTERPHALANGEAL   RIGHT THIRD CLAW TOE  . Bunionectomy with hammertoe reconstruction Right 03/01/2013    Procedure: RIGHT FOOT EXCISION BUNIONETTE AND FUSION OF DIP FOURTH TOE;  Surgeon: [name], MD;  Location: [place];  Service: Orthopedics;  Laterality: Right;    History  Smoking status  . Never Smoker   Smokeless tobacco  . Never Used    History  Alcohol Use  . Yes    Comment: occasional    Family History  Problem Relation Age of Onset  . Heart disease Mother 35  . Prostate cancer Father 56    Review of Systems:  As noted in history of present illness.  All other systems were reviewed and are negative.  Physical Exam: BP 132/74 mmHg  Pulse 84  Ht 5\' 5"  (1.651 m)  Wt 163 lb 3.2 oz (74.027 kg)  BMI 27.16 kg/m2 She is a pleasant white female in no acute distress. Her HEENT exam is unremarkable. Sclera are clear. Oropharynx is clear. Neck is supple without JVD, adenopathy, thyromegaly, or bruits. Lungs are clear. Cardiac exam reveals a regular rate and rhythm without gallop. There is a grade 2/6 systolic murmur heard best at the left sternal border. Abdomen is soft and nontender. She has no significant edema. DP and PT pulses are palpable. She does have fairly extensive venous varicosities with some dependent bluish discoloration of her toes.  She is alert and oriented x3. Cranial nerves II through XII are intact.  LABORATORY DATA: INR today is 2.3  Assessment / Plan: 1. Atrial fibrillation, permanent. Rate is well controlled on diltiazem. She is anticoagulated on Coumadin. INR is therapeutic.  2. Dyspnea on exertion. Now clinically much better. She seems to do worse in warm humid weather suggesting this is more of a pulmonary issue. She really thrives in cold weather. No change in therapy today.  3. Hypertension with hypertensive heart disease, well controlled.  4. Hyperlipidemia on statin therapy.   5. Pulmonary hypertension-moderate  6. Coronary disease.  Cardiac catheterization in 2010 showed severe stenosis in a small diagonal branch. She otherwise had no significant disease. She is asymptomatic.   7. Moderate mitral insufficiency.

## 2014-11-27 NOTE — Patient Instructions (Signed)
Continue your current therapy  I will see you in 6 months.   

## 2014-11-27 NOTE — Progress Notes (Signed)
This encounter was created in error - please disregard.

## 2014-12-11 DIAGNOSIS — C44729 Squamous cell carcinoma of skin of left lower limb, including hip: Secondary | ICD-10-CM | POA: Diagnosis not present

## 2014-12-23 ENCOUNTER — Other Ambulatory Visit: Payer: Self-pay

## 2014-12-23 MED ORDER — WARFARIN SODIUM 5 MG PO TABS: ORAL_TABLET | ORAL | Status: DC

## 2014-12-25 ENCOUNTER — Ambulatory Visit (INDEPENDENT_AMBULATORY_CARE_PROVIDER_SITE_OTHER): Payer: Medicare Other | Admitting: *Deleted

## 2014-12-25 DIAGNOSIS — M5136 Other intervertebral disc degeneration, lumbar region: Secondary | ICD-10-CM | POA: Diagnosis not present

## 2014-12-25 DIAGNOSIS — I4891 Unspecified atrial fibrillation: Secondary | ICD-10-CM

## 2014-12-25 DIAGNOSIS — I482 Chronic atrial fibrillation, unspecified: Secondary | ICD-10-CM

## 2014-12-25 DIAGNOSIS — Z7901 Long term (current) use of anticoagulants: Secondary | ICD-10-CM | POA: Diagnosis not present

## 2014-12-25 DIAGNOSIS — G894 Chronic pain syndrome: Secondary | ICD-10-CM | POA: Diagnosis not present

## 2014-12-25 DIAGNOSIS — Z5181 Encounter for therapeutic drug level monitoring: Secondary | ICD-10-CM

## 2014-12-25 DIAGNOSIS — M5030 Other cervical disc degeneration, unspecified cervical region: Secondary | ICD-10-CM | POA: Diagnosis not present

## 2014-12-25 DIAGNOSIS — Z79891 Long term (current) use of opiate analgesic: Secondary | ICD-10-CM | POA: Diagnosis not present

## 2014-12-25 LAB — POCT INR: INR: 2.2

## 2015-01-01 ENCOUNTER — Telehealth: Payer: Self-pay | Admitting: Cardiology

## 2015-01-01 NOTE — Telephone Encounter (Signed)
Returned call to [facility] with Dr.Rammos's office.Dr.Jordan advised ok to hold Coumadin 5 days prior to steroid injection.Note faxed to Deanna at fax # [phone].

## 2015-01-01 NOTE — Telephone Encounter (Signed)
Received a call from [facility] with Dr.Ramos's office.Patient will be receiving steroid injection,wanting to know if ok to hold coumadin.Will check with Dr.Jordan this afternoon and call back.

## 2015-01-13 DIAGNOSIS — L853 Xerosis cutis: Secondary | ICD-10-CM | POA: Diagnosis not present

## 2015-01-13 DIAGNOSIS — L299 Pruritus, unspecified: Secondary | ICD-10-CM | POA: Diagnosis not present

## 2015-01-13 DIAGNOSIS — L300 Nummular dermatitis: Secondary | ICD-10-CM | POA: Diagnosis not present

## 2015-01-14 ENCOUNTER — Ambulatory Visit (INDEPENDENT_AMBULATORY_CARE_PROVIDER_SITE_OTHER): Payer: Medicare Other | Admitting: Pharmacist Clinician (PhC)/ Clinical Pharmacy Specialist

## 2015-01-14 DIAGNOSIS — Z7901 Long term (current) use of anticoagulants: Secondary | ICD-10-CM | POA: Diagnosis not present

## 2015-01-14 DIAGNOSIS — M5136 Other intervertebral disc degeneration, lumbar region: Secondary | ICD-10-CM | POA: Diagnosis not present

## 2015-01-14 DIAGNOSIS — I482 Chronic atrial fibrillation, unspecified: Secondary | ICD-10-CM

## 2015-01-14 DIAGNOSIS — Z5181 Encounter for therapeutic drug level monitoring: Secondary | ICD-10-CM | POA: Diagnosis not present

## 2015-01-14 DIAGNOSIS — I4891 Unspecified atrial fibrillation: Secondary | ICD-10-CM | POA: Diagnosis not present

## 2015-01-14 LAB — POCT INR: INR: 1.1

## 2015-01-21 ENCOUNTER — Ambulatory Visit: Payer: Medicare Other | Admitting: Pharmacist Clinician (PhC)/ Clinical Pharmacy Specialist

## 2015-01-21 ENCOUNTER — Ambulatory Visit
Admission: RE | Admit: 2015-01-21 | Discharge: 2015-01-21 | Disposition: A | Discharge status: Home / Self Care | Payer: Medicare Other | Source: Ambulatory Visit | Attending: Obstetrics and Gynecology | Admitting: Obstetrics and Gynecology

## 2015-01-21 DIAGNOSIS — Z13820 Encounter for screening for osteoporosis: Secondary | ICD-10-CM | POA: Diagnosis not present

## 2015-01-21 DIAGNOSIS — M8580 Other specified disorders of bone density and structure, unspecified site: Secondary | ICD-10-CM

## 2015-01-21 DIAGNOSIS — Z780 Asymptomatic menopausal state: Secondary | ICD-10-CM | POA: Diagnosis not present

## 2015-01-28 DIAGNOSIS — H5711 Ocular pain, right eye: Secondary | ICD-10-CM | POA: Diagnosis not present

## 2015-01-28 DIAGNOSIS — Z79899 Other long term (current) drug therapy: Secondary | ICD-10-CM | POA: Diagnosis not present

## 2015-01-28 DIAGNOSIS — E784 Other hyperlipidemia: Secondary | ICD-10-CM | POA: Diagnosis not present

## 2015-01-28 DIAGNOSIS — Z0000 Encounter for general adult medical examination without abnormal findings: Secondary | ICD-10-CM | POA: Diagnosis not present

## 2015-01-28 DIAGNOSIS — E559 Vitamin D deficiency, unspecified: Secondary | ICD-10-CM | POA: Diagnosis not present

## 2015-01-28 DIAGNOSIS — H40013 Open angle with borderline findings, low risk, bilateral: Secondary | ICD-10-CM | POA: Diagnosis not present

## 2015-01-28 DIAGNOSIS — I119 Hypertensive heart disease without heart failure: Secondary | ICD-10-CM | POA: Diagnosis not present

## 2015-02-10 DIAGNOSIS — N3946 Mixed incontinence: Secondary | ICD-10-CM | POA: Diagnosis not present

## 2015-02-13 ENCOUNTER — Ambulatory Visit: Payer: Medicare Other | Admitting: Pharmacist Clinician (PhC)/ Clinical Pharmacy Specialist

## 2015-02-26 ENCOUNTER — Ambulatory Visit (INDEPENDENT_AMBULATORY_CARE_PROVIDER_SITE_OTHER): Payer: Medicare Other | Admitting: Pharmacist Clinician (PhC)/ Clinical Pharmacy Specialist

## 2015-02-26 DIAGNOSIS — I482 Chronic atrial fibrillation, unspecified: Secondary | ICD-10-CM

## 2015-02-26 DIAGNOSIS — I4891 Unspecified atrial fibrillation: Secondary | ICD-10-CM

## 2015-02-26 DIAGNOSIS — Z7901 Long term (current) use of anticoagulants: Secondary | ICD-10-CM | POA: Diagnosis not present

## 2015-02-26 DIAGNOSIS — Z5181 Encounter for therapeutic drug level monitoring: Secondary | ICD-10-CM | POA: Diagnosis not present

## 2015-02-26 LAB — POCT INR: INR: 2.4

## 2015-03-02 DIAGNOSIS — H01003 Unspecified blepharitis right eye, unspecified eyelid: Secondary | ICD-10-CM | POA: Diagnosis not present

## 2015-03-02 DIAGNOSIS — H5711 Ocular pain, right eye: Secondary | ICD-10-CM | POA: Diagnosis not present

## 2015-03-17 DIAGNOSIS — C44729 Squamous cell carcinoma of skin of left lower limb, including hip: Secondary | ICD-10-CM | POA: Diagnosis not present

## 2015-03-17 DIAGNOSIS — L570 Actinic keratosis: Secondary | ICD-10-CM | POA: Diagnosis not present

## 2015-03-23 ENCOUNTER — Other Ambulatory Visit: Payer: Self-pay | Admitting: *Deleted

## 2015-03-23 MED ORDER — DILTIAZEM HCL ER COATED BEADS 120 MG PO CP24: ORAL_CAPSULE | ORAL | Status: DC

## 2015-03-26 ENCOUNTER — Ambulatory Visit (INDEPENDENT_AMBULATORY_CARE_PROVIDER_SITE_OTHER): Payer: Medicare Other | Admitting: Pharmacist Clinician (PhC)/ Clinical Pharmacy Specialist

## 2015-03-26 DIAGNOSIS — I482 Chronic atrial fibrillation, unspecified: Secondary | ICD-10-CM

## 2015-03-26 DIAGNOSIS — Z79891 Long term (current) use of opiate analgesic: Secondary | ICD-10-CM | POA: Diagnosis not present

## 2015-03-26 DIAGNOSIS — M5136 Other intervertebral disc degeneration, lumbar region: Secondary | ICD-10-CM | POA: Diagnosis not present

## 2015-03-26 DIAGNOSIS — Z5181 Encounter for therapeutic drug level monitoring: Secondary | ICD-10-CM

## 2015-03-26 DIAGNOSIS — I4891 Unspecified atrial fibrillation: Secondary | ICD-10-CM | POA: Diagnosis not present

## 2015-03-26 DIAGNOSIS — G894 Chronic pain syndrome: Secondary | ICD-10-CM | POA: Diagnosis not present

## 2015-03-26 DIAGNOSIS — Z7901 Long term (current) use of anticoagulants: Secondary | ICD-10-CM | POA: Diagnosis not present

## 2015-03-26 LAB — POCT INR: INR: 1.8

## 2015-04-10 DIAGNOSIS — E782 Mixed hyperlipidemia: Secondary | ICD-10-CM | POA: Diagnosis not present

## 2015-04-10 DIAGNOSIS — R7301 Impaired fasting glucose: Secondary | ICD-10-CM | POA: Diagnosis not present

## 2015-04-19 ENCOUNTER — Other Ambulatory Visit: Payer: Self-pay | Admitting: Cardiology

## 2015-04-20 NOTE — Telephone Encounter (Signed)
REFILL 

## 2015-04-22 ENCOUNTER — Ambulatory Visit: Payer: Medicare Other | Admitting: Pharmacist Clinician (PhC)/ Clinical Pharmacy Specialist

## 2015-04-23 ENCOUNTER — Ambulatory Visit (INDEPENDENT_AMBULATORY_CARE_PROVIDER_SITE_OTHER): Payer: Medicare Other | Admitting: *Deleted

## 2015-04-23 DIAGNOSIS — Z5181 Encounter for therapeutic drug level monitoring: Secondary | ICD-10-CM | POA: Diagnosis not present

## 2015-04-23 DIAGNOSIS — I482 Chronic atrial fibrillation, unspecified: Secondary | ICD-10-CM

## 2015-04-23 DIAGNOSIS — Z7901 Long term (current) use of anticoagulants: Secondary | ICD-10-CM

## 2015-04-23 DIAGNOSIS — R7301 Impaired fasting glucose: Secondary | ICD-10-CM | POA: Diagnosis not present

## 2015-04-23 DIAGNOSIS — R7309 Other abnormal glucose: Secondary | ICD-10-CM | POA: Diagnosis not present

## 2015-04-23 DIAGNOSIS — E782 Mixed hyperlipidemia: Secondary | ICD-10-CM | POA: Diagnosis not present

## 2015-04-23 DIAGNOSIS — E559 Vitamin D deficiency, unspecified: Secondary | ICD-10-CM | POA: Diagnosis not present

## 2015-04-23 DIAGNOSIS — I4891 Unspecified atrial fibrillation: Secondary | ICD-10-CM | POA: Diagnosis not present

## 2015-04-23 DIAGNOSIS — I10 Essential (primary) hypertension: Secondary | ICD-10-CM | POA: Diagnosis not present

## 2015-04-23 LAB — POCT INR: INR: 2.2

## 2015-04-23 NOTE — Progress Notes (Signed)
Pt came for coumadin check today. Therapeutic INR. Instructed to continue current dosing unless called and alerted to change. Pt verbalized understanding.

## 2015-04-23 NOTE — Patient Instructions (Signed)
See dosing instructions. Repeat in 4 weeks.

## 2015-04-24 DIAGNOSIS — R7301 Impaired fasting glucose: Secondary | ICD-10-CM | POA: Diagnosis not present

## 2015-04-29 ENCOUNTER — Telehealth: Payer: Self-pay | Admitting: Cardiology

## 2015-04-29 NOTE — Telephone Encounter (Signed)
Returned call to patient she stated she has been having sob with exertion.Stated sob no worse than it normally is.No chest pain.Stated she would like to see Dr.Jordan before 05/28/15.Stated she does not want to see a PA.Dr.Jordan out of office this week.Appointment offered 05/04/15 at 4:30 pm.Stated she cannot come wants a morning appointment.Advised Dr.Jordan is only in office first week of July in afternoons.Stated she will keep her appointment 05/28/15 at 3:00 pm.Advised to call back if sob becomes worse or go to ER.

## 2015-04-29 NOTE — Telephone Encounter (Signed)
[name] is calling because she is having some shortness of breath when she does a little more activity . Please call    Thanks

## 2015-04-29 NOTE — Telephone Encounter (Signed)
Received records from [facility] for appointment on 05/28/15 with Dr [name].  Records given to [facility] (medical records) for Dr Doug Sou schedule on 05/28/15. lp

## 2015-05-06 ENCOUNTER — Telehealth: Payer: Self-pay | Admitting: Pharmacist Clinician (PhC)/ Clinical Pharmacy Specialist

## 2015-05-06 NOTE — Telephone Encounter (Signed)
Request from [place] to hold warfarin x 5 days for injection.  Scheduled for 7.19.16.  Per protocol, pt ok to come off warfarin x 5 days.  CHADS2 score of 2.  Form faxed back to [facility] and patient notified.

## 2015-05-15 DIAGNOSIS — C44729 Squamous cell carcinoma of skin of left lower limb, including hip: Secondary | ICD-10-CM | POA: Diagnosis not present

## 2015-05-15 DIAGNOSIS — L570 Actinic keratosis: Secondary | ICD-10-CM | POA: Diagnosis not present

## 2015-05-19 DIAGNOSIS — L570 Actinic keratosis: Secondary | ICD-10-CM | POA: Diagnosis not present

## 2015-05-21 ENCOUNTER — Ambulatory Visit: Payer: Medicare Other | Admitting: Cardiology

## 2015-05-26 ENCOUNTER — Ambulatory Visit (INDEPENDENT_AMBULATORY_CARE_PROVIDER_SITE_OTHER): Payer: Medicare Other | Admitting: *Deleted

## 2015-05-26 DIAGNOSIS — M5136 Other intervertebral disc degeneration, lumbar region: Secondary | ICD-10-CM | POA: Diagnosis not present

## 2015-05-26 DIAGNOSIS — I482 Chronic atrial fibrillation, unspecified: Secondary | ICD-10-CM

## 2015-05-26 DIAGNOSIS — I4891 Unspecified atrial fibrillation: Secondary | ICD-10-CM

## 2015-05-26 DIAGNOSIS — Z7901 Long term (current) use of anticoagulants: Secondary | ICD-10-CM

## 2015-05-26 DIAGNOSIS — Z5181 Encounter for therapeutic drug level monitoring: Secondary | ICD-10-CM | POA: Diagnosis not present

## 2015-05-26 LAB — POCT INR: INR: 1

## 2015-05-28 ENCOUNTER — Encounter: Payer: Self-pay | Admitting: Cardiology

## 2015-05-28 ENCOUNTER — Ambulatory Visit (INDEPENDENT_AMBULATORY_CARE_PROVIDER_SITE_OTHER): Payer: Medicare Other | Admitting: Cardiology

## 2015-05-28 VITALS — BP 128/62 | HR 96 | Ht 65.0 in | Wt 164.2 lb

## 2015-05-28 DIAGNOSIS — Z7901 Long term (current) use of anticoagulants: Secondary | ICD-10-CM | POA: Diagnosis not present

## 2015-05-28 DIAGNOSIS — I272 Pulmonary hypertension, unspecified: Secondary | ICD-10-CM

## 2015-05-28 DIAGNOSIS — I10 Essential (primary) hypertension: Secondary | ICD-10-CM

## 2015-05-28 DIAGNOSIS — I270 Primary pulmonary hypertension: Secondary | ICD-10-CM

## 2015-05-28 DIAGNOSIS — I482 Chronic atrial fibrillation, unspecified: Secondary | ICD-10-CM

## 2015-05-28 DIAGNOSIS — R7301 Impaired fasting glucose: Secondary | ICD-10-CM | POA: Diagnosis not present

## 2015-05-28 DIAGNOSIS — I340 Nonrheumatic mitral (valve) insufficiency: Secondary | ICD-10-CM

## 2015-05-28 NOTE — Patient Instructions (Signed)
Continue your current therapy  We will schedule you for an Echocardiogram   I will see you in 6 months   

## 2015-05-28 NOTE — Progress Notes (Signed)
[name] Date of Birth: 1932-04-02   History of Present Illness: [name] is seen for follow up Afib. She has a history of chronic atrial fibrillation, hypertensive heart disease, and pulmonary hypertension. She has a history of chronic back problems. She has moderate MR and COPD. She is having a tough time with the hot, humid weather. No current cough, edema, PND, or orthopnea. Breathing is fair. No chest pain or palpitations.   Current Outpatient Prescriptions on File Prior to Visit  Medication Sig Dispense Refill  . Ascorbic Acid (VITAMIN C) 1000 MG tablet Take 1,000 mg by mouth daily.      Marland Kitchen atorvastatin (LIPITOR) 20 MG tablet Take 1 tablet by mouth Daily.    . Calcium Carbonate-Vitamin D (CALCIUM + D PO) Take by mouth daily.      . Cholecalciferol (VITAMIN D PO) Take by mouth daily.      . Cyanocobalamin (VITAMIN B-12 PO) Take by mouth as directed.    . diltiazem (CARDIZEM CD) 120 MG 24 hr capsule TAKE ONE CAPSULE BY MOUTH EVERY DAY 30 capsule 9  . estrogens, conjugated, (PREMARIN) 1.25 MG tablet Take 1.25 mg by mouth daily.      . furosemide (LASIX) 40 MG tablet TAKE 2 TABLETS BY MOUTH EVERY MORNING & 1 TABLET IN THE EVENING 90 tablet 3  . gabapentin (NEURONTIN) 100 MG capsule Take 1 tablet by mouth 2 (two) times daily.     Marland Kitchen HYDROcodone-acetaminophen (NORCO) 10-325 MG per tablet Take 1 tablet by mouth every 6 (six) hours as needed.    Marland Kitchen lisinopril (PRINIVIL,ZESTRIL) 20 MG tablet Take 1 tablet by mouth Daily.    . Multiple Vitamins-Minerals (ZINC PO) Take by mouth daily.      Marland Kitchen MYRBETRIQ 25 MG TB24 tablet Take 25 mg by mouth daily.    . Potassium (POTASSIMIN PO) Take 595 mg by mouth daily. 1 Tablet daily    . sertraline (ZOLOFT) 50 MG tablet Take 1 tablet by mouth daily.  3  . valACYclovir (VALTREX) 500 MG tablet     . vitamin A 8000 UNIT capsule Take 8,000 Units by mouth daily.      Marland Kitchen warfarin (COUMADIN) 5 MG tablet Take 1 tablet by mouth daily or as directed by  coumadin clinic 30 tablet 3  . [DISCONTINUED] diltiazem (CARDIZEM) 120 MG tablet Take 1 tablet (120 mg total) by mouth daily. 30 tablet 5   No current facility-administered medications on file prior to visit.    Allergies  Allergen Reactions  . Codeine Nausea And Vomiting  . Penicillins   . Sulfonamide Derivatives Rash    Past Medical History  Diagnosis Date  . Mitral insufficiency     CHRONIC  . Hypertensive heart disease     WITH LVH  . LVH (left ventricular hypertrophy) due to hypertensive disease   . Chronic atrial fibrillation   . Pulmonary hypertension   . Hypercholesterolemia   . Back pain, chronic   . Depression   . Coronary artery disease CARDIOLOGIST- DR [name]--  LOV IN EPIC  . Anticoagulated on Coumadin     CHRONIC  . Arthritis   . Anemia   . Hypertension   . Hammer toe   . Transfusion history     Past Surgical History  Procedure Laterality Date  . Total hip arthroplasty      BILATERAL---  LEFT 4/98;   RIGHT  11/98  . Abdominal hysterectomy    . Varus osteotomy proximal phalanx, left great  toe/ paring down the callus , second left toe  10-27-2008  . Repair right second claw toe/ varus medial closing wedge osteotomy proximal phalanx right great toe  05-05-2005  . Anterior repair/ sparc pubovaginal sling  05-03-2002  . Pars plana vitrectomy w/ repair of macular hole  02-04-2002    LEFT EYE  . Cardiac catheterization  02-03-2009  DR  [name]    SINGLE-VESSEL OBSTRUCTIVE CAD, SMALL DIAGONAL BRANCH/ NORMAL LVF/ SEVERE MITRAL INSUFFICIENCY/ SEVERE PULMONARY HYPERTENSION  . Cataract extraction w/ intraocular lens  implant, bilateral  2003  . Hammer toe surgery  02/28/2012    Procedure: HAMMER TOE CORRECTION;  Surgeon: Jodi Sinning, MD;  Location: [name];  Service: Orthopedics;  Laterality: Right;  FUSION OF PROXIMAL INTERPHALANGEAL  RIGHT THIRD CLAW TOE  . Bunionectomy with hammertoe reconstruction Right 03/01/2013    Procedure: RIGHT FOOT  EXCISION BUNIONETTE AND FUSION OF DIP FOURTH TOE;  Surgeon: Jodi Sinning, MD;  Location: [place];  Service: Orthopedics;  Laterality: Right;    History  Smoking status  . Never Smoker   Smokeless tobacco  . Never Used    History  Alcohol Use  . Yes    Comment: occasional    Family History  Problem Relation Age of Onset  . Heart disease Mother 33  . Prostate cancer Father 21    Review of Systems:  As noted in history of present illness.  All other systems were reviewed and are negative.  Physical Exam: BP 128/62 mmHg  Pulse 96  Ht 5\' 5"  (1.651 m)  Wt 74.481 kg (164 lb 3.2 oz)  BMI 27.32 kg/m2 She is a pleasant white female in no acute distress. Her HEENT exam is unremarkable. Sclera are clear. Oropharynx is clear. Neck is supple without JVD, adenopathy, thyromegaly, or bruits. Lungs are clear. Cardiac exam reveals a regular rate and rhythm without gallop. There is a grade 2/6 systolic murmur heard best at the left sternal border. Abdomen is soft and nontender. She has no significant edema. DP and PT pulses are palpable. She does have fairly extensive venous varicosities with some dependent bluish discoloration of her toes.  She is alert and oriented x3. Cranial nerves II through XII are intact.  LABORATORY DATA: INR today is 1.0 on 7/19- coumadin held for back injection  Assessment / Plan: 1. Atrial fibrillation, permanent. Rate is well controlled on diltiazem. She is anticoagulated on Coumadin. Resume coumadin  2. Moderate MR. It has been 2 years since last Echo- will update.   3. Hypertension with hypertensive heart disease, well controlled.  4. Hyperlipidemia on statin therapy.   5. Pulmonary hypertension-moderate  6. Coronary disease. Cardiac catheterization in 2010 showed severe stenosis in a small diagonal branch. She otherwise had no significant disease. She is asymptomatic.

## 2015-06-03 ENCOUNTER — Ambulatory Visit: Payer: Medicare Other | Admitting: Pharmacist Clinician (PhC)/ Clinical Pharmacy Specialist

## 2015-06-04 DIAGNOSIS — I10 Essential (primary) hypertension: Secondary | ICD-10-CM | POA: Diagnosis not present

## 2015-06-04 DIAGNOSIS — R7309 Other abnormal glucose: Secondary | ICD-10-CM | POA: Diagnosis not present

## 2015-06-04 DIAGNOSIS — R7301 Impaired fasting glucose: Secondary | ICD-10-CM | POA: Diagnosis not present

## 2015-06-04 DIAGNOSIS — E559 Vitamin D deficiency, unspecified: Secondary | ICD-10-CM | POA: Diagnosis not present

## 2015-06-04 DIAGNOSIS — E782 Mixed hyperlipidemia: Secondary | ICD-10-CM | POA: Diagnosis not present

## 2015-06-04 DIAGNOSIS — I482 Chronic atrial fibrillation: Secondary | ICD-10-CM | POA: Diagnosis not present

## 2015-06-22 ENCOUNTER — Other Ambulatory Visit: Payer: Self-pay | Admitting: Pharmacist Clinician (PhC)/ Clinical Pharmacy Specialist

## 2015-06-22 MED ORDER — WARFARIN SODIUM 5 MG PO TABS: ORAL_TABLET | ORAL | Status: DC

## 2015-06-25 ENCOUNTER — Other Ambulatory Visit: Payer: Self-pay

## 2015-06-25 ENCOUNTER — Ambulatory Visit (HOSPITAL_COMMUNITY): Payer: Medicare Other | Attending: Cardiology

## 2015-06-25 ENCOUNTER — Ambulatory Visit (INDEPENDENT_AMBULATORY_CARE_PROVIDER_SITE_OTHER): Payer: Medicare Other | Admitting: Pharmacist Clinician (PhC)/ Clinical Pharmacy Specialist

## 2015-06-25 DIAGNOSIS — I482 Chronic atrial fibrillation, unspecified: Secondary | ICD-10-CM

## 2015-06-25 DIAGNOSIS — I340 Nonrheumatic mitral (valve) insufficiency: Secondary | ICD-10-CM | POA: Diagnosis not present

## 2015-06-25 DIAGNOSIS — I4891 Unspecified atrial fibrillation: Secondary | ICD-10-CM | POA: Diagnosis not present

## 2015-06-25 DIAGNOSIS — Z5181 Encounter for therapeutic drug level monitoring: Secondary | ICD-10-CM | POA: Diagnosis not present

## 2015-06-25 DIAGNOSIS — Z7901 Long term (current) use of anticoagulants: Secondary | ICD-10-CM

## 2015-06-25 DIAGNOSIS — I270 Primary pulmonary hypertension: Secondary | ICD-10-CM

## 2015-06-25 DIAGNOSIS — M5030 Other cervical disc degeneration, unspecified cervical region: Secondary | ICD-10-CM | POA: Diagnosis not present

## 2015-06-25 DIAGNOSIS — I10 Essential (primary) hypertension: Secondary | ICD-10-CM

## 2015-06-25 DIAGNOSIS — I272 Pulmonary hypertension, unspecified: Secondary | ICD-10-CM

## 2015-06-25 LAB — POCT INR: INR: 2

## 2015-07-30 ENCOUNTER — Ambulatory Visit: Payer: Medicare Other | Admitting: Pharmacist Clinician (PhC)/ Clinical Pharmacy Specialist

## 2015-08-05 ENCOUNTER — Ambulatory Visit: Payer: Medicare Other | Admitting: Pharmacist Clinician (PhC)/ Clinical Pharmacy Specialist

## 2015-08-05 ENCOUNTER — Ambulatory Visit (INDEPENDENT_AMBULATORY_CARE_PROVIDER_SITE_OTHER): Payer: Medicare Other | Admitting: Pharmacist Clinician (PhC)/ Clinical Pharmacy Specialist

## 2015-08-05 DIAGNOSIS — I482 Chronic atrial fibrillation, unspecified: Secondary | ICD-10-CM

## 2015-08-05 DIAGNOSIS — Z471 Aftercare following joint replacement surgery: Secondary | ICD-10-CM | POA: Diagnosis not present

## 2015-08-05 DIAGNOSIS — Z7901 Long term (current) use of anticoagulants: Secondary | ICD-10-CM | POA: Diagnosis not present

## 2015-08-05 DIAGNOSIS — I4891 Unspecified atrial fibrillation: Secondary | ICD-10-CM | POA: Diagnosis not present

## 2015-08-05 DIAGNOSIS — Z96643 Presence of artificial hip joint, bilateral: Secondary | ICD-10-CM | POA: Diagnosis not present

## 2015-08-05 DIAGNOSIS — Z5181 Encounter for therapeutic drug level monitoring: Secondary | ICD-10-CM

## 2015-08-05 LAB — POCT INR: INR: 3.5

## 2015-08-13 ENCOUNTER — Ambulatory Visit (INDEPENDENT_AMBULATORY_CARE_PROVIDER_SITE_OTHER): Payer: Medicare Other | Admitting: Pharmacist Clinician (PhC)/ Clinical Pharmacy Specialist

## 2015-08-13 ENCOUNTER — Ambulatory Visit: Payer: Medicare Other | Admitting: Pharmacist Clinician (PhC)/ Clinical Pharmacy Specialist

## 2015-08-13 DIAGNOSIS — I482 Chronic atrial fibrillation, unspecified: Secondary | ICD-10-CM

## 2015-08-13 DIAGNOSIS — Z7901 Long term (current) use of anticoagulants: Secondary | ICD-10-CM

## 2015-08-13 DIAGNOSIS — I4891 Unspecified atrial fibrillation: Secondary | ICD-10-CM | POA: Diagnosis not present

## 2015-08-13 DIAGNOSIS — M5030 Other cervical disc degeneration, unspecified cervical region: Secondary | ICD-10-CM | POA: Diagnosis not present

## 2015-08-13 DIAGNOSIS — M5136 Other intervertebral disc degeneration, lumbar region: Secondary | ICD-10-CM | POA: Diagnosis not present

## 2015-08-13 DIAGNOSIS — Z5181 Encounter for therapeutic drug level monitoring: Secondary | ICD-10-CM | POA: Diagnosis not present

## 2015-08-13 DIAGNOSIS — G894 Chronic pain syndrome: Secondary | ICD-10-CM | POA: Diagnosis not present

## 2015-08-13 DIAGNOSIS — Z79891 Long term (current) use of opiate analgesic: Secondary | ICD-10-CM | POA: Diagnosis not present

## 2015-08-13 LAB — POCT INR: INR: 2.1

## 2015-08-23 ENCOUNTER — Other Ambulatory Visit: Payer: Self-pay | Admitting: Cardiology

## 2015-08-24 NOTE — Telephone Encounter (Signed)
Rx request sent to pharmacy.  

## 2015-08-25 DIAGNOSIS — E782 Mixed hyperlipidemia: Secondary | ICD-10-CM | POA: Diagnosis not present

## 2015-08-25 DIAGNOSIS — R7309 Other abnormal glucose: Secondary | ICD-10-CM | POA: Diagnosis not present

## 2015-08-25 DIAGNOSIS — R7301 Impaired fasting glucose: Secondary | ICD-10-CM | POA: Diagnosis not present

## 2015-08-27 DIAGNOSIS — R7301 Impaired fasting glucose: Secondary | ICD-10-CM | POA: Diagnosis not present

## 2015-08-27 DIAGNOSIS — E782 Mixed hyperlipidemia: Secondary | ICD-10-CM | POA: Diagnosis not present

## 2015-08-27 DIAGNOSIS — R7309 Other abnormal glucose: Secondary | ICD-10-CM | POA: Diagnosis not present

## 2015-08-27 DIAGNOSIS — E559 Vitamin D deficiency, unspecified: Secondary | ICD-10-CM | POA: Diagnosis not present

## 2015-08-27 DIAGNOSIS — I10 Essential (primary) hypertension: Secondary | ICD-10-CM | POA: Diagnosis not present

## 2015-08-27 DIAGNOSIS — I482 Chronic atrial fibrillation: Secondary | ICD-10-CM | POA: Diagnosis not present

## 2015-09-02 ENCOUNTER — Ambulatory Visit (INDEPENDENT_AMBULATORY_CARE_PROVIDER_SITE_OTHER): Payer: Medicare Other | Admitting: Pharmacist Clinician (PhC)/ Clinical Pharmacy Specialist

## 2015-09-02 DIAGNOSIS — I482 Chronic atrial fibrillation, unspecified: Secondary | ICD-10-CM

## 2015-09-02 DIAGNOSIS — I4891 Unspecified atrial fibrillation: Secondary | ICD-10-CM

## 2015-09-02 DIAGNOSIS — Z7901 Long term (current) use of anticoagulants: Secondary | ICD-10-CM

## 2015-09-02 DIAGNOSIS — Z96643 Presence of artificial hip joint, bilateral: Secondary | ICD-10-CM | POA: Diagnosis not present

## 2015-09-02 DIAGNOSIS — Z5181 Encounter for therapeutic drug level monitoring: Secondary | ICD-10-CM

## 2015-09-02 DIAGNOSIS — M7061 Trochanteric bursitis, right hip: Secondary | ICD-10-CM | POA: Diagnosis not present

## 2015-09-02 DIAGNOSIS — M7062 Trochanteric bursitis, left hip: Secondary | ICD-10-CM | POA: Diagnosis not present

## 2015-09-02 DIAGNOSIS — Z471 Aftercare following joint replacement surgery: Secondary | ICD-10-CM | POA: Diagnosis not present

## 2015-09-02 DIAGNOSIS — Z1231 Encounter for screening mammogram for malignant neoplasm of breast: Secondary | ICD-10-CM | POA: Diagnosis not present

## 2015-09-02 LAB — POCT INR: INR: 2.9

## 2015-09-24 DIAGNOSIS — H353120 Nonexudative age-related macular degeneration, left eye, stage unspecified: Secondary | ICD-10-CM | POA: Diagnosis not present

## 2015-09-24 DIAGNOSIS — H04123 Dry eye syndrome of bilateral lacrimal glands: Secondary | ICD-10-CM | POA: Diagnosis not present

## 2015-09-24 DIAGNOSIS — H35373 Puckering of macula, bilateral: Secondary | ICD-10-CM | POA: Diagnosis not present

## 2015-09-24 DIAGNOSIS — H353110 Nonexudative age-related macular degeneration, right eye, stage unspecified: Secondary | ICD-10-CM | POA: Diagnosis not present

## 2015-09-30 ENCOUNTER — Ambulatory Visit (INDEPENDENT_AMBULATORY_CARE_PROVIDER_SITE_OTHER): Payer: Medicare Other | Admitting: Pharmacist Clinician (PhC)/ Clinical Pharmacy Specialist

## 2015-09-30 DIAGNOSIS — Z7901 Long term (current) use of anticoagulants: Secondary | ICD-10-CM | POA: Diagnosis not present

## 2015-09-30 DIAGNOSIS — I482 Chronic atrial fibrillation, unspecified: Secondary | ICD-10-CM

## 2015-09-30 DIAGNOSIS — Z5181 Encounter for therapeutic drug level monitoring: Secondary | ICD-10-CM

## 2015-09-30 DIAGNOSIS — I4891 Unspecified atrial fibrillation: Secondary | ICD-10-CM

## 2015-09-30 LAB — POCT INR: INR: 2.2

## 2015-10-27 DIAGNOSIS — L820 Inflamed seborrheic keratosis: Secondary | ICD-10-CM | POA: Diagnosis not present

## 2015-10-27 DIAGNOSIS — L853 Xerosis cutis: Secondary | ICD-10-CM | POA: Diagnosis not present

## 2015-10-29 ENCOUNTER — Ambulatory Visit (INDEPENDENT_AMBULATORY_CARE_PROVIDER_SITE_OTHER): Payer: Medicare Other | Admitting: Pharmacist Clinician (PhC)/ Clinical Pharmacy Specialist

## 2015-10-29 DIAGNOSIS — M5136 Other intervertebral disc degeneration, lumbar region: Secondary | ICD-10-CM | POA: Diagnosis not present

## 2015-10-29 DIAGNOSIS — G894 Chronic pain syndrome: Secondary | ICD-10-CM | POA: Diagnosis not present

## 2015-10-29 DIAGNOSIS — I482 Chronic atrial fibrillation, unspecified: Secondary | ICD-10-CM

## 2015-10-29 DIAGNOSIS — Z5181 Encounter for therapeutic drug level monitoring: Secondary | ICD-10-CM | POA: Diagnosis not present

## 2015-10-29 DIAGNOSIS — I4891 Unspecified atrial fibrillation: Secondary | ICD-10-CM

## 2015-10-29 DIAGNOSIS — Z7901 Long term (current) use of anticoagulants: Secondary | ICD-10-CM | POA: Diagnosis not present

## 2015-10-29 DIAGNOSIS — M5030 Other cervical disc degeneration, unspecified cervical region: Secondary | ICD-10-CM | POA: Diagnosis not present

## 2015-10-29 LAB — POCT INR: INR: 2

## 2015-11-22 ENCOUNTER — Other Ambulatory Visit: Payer: Self-pay | Admitting: Cardiology

## 2015-11-26 ENCOUNTER — Ambulatory Visit (INDEPENDENT_AMBULATORY_CARE_PROVIDER_SITE_OTHER): Payer: Medicare Other | Admitting: Pharmacist Clinician (PhC)/ Clinical Pharmacy Specialist

## 2015-11-26 DIAGNOSIS — Z5181 Encounter for therapeutic drug level monitoring: Secondary | ICD-10-CM | POA: Diagnosis not present

## 2015-11-26 DIAGNOSIS — I482 Chronic atrial fibrillation, unspecified: Secondary | ICD-10-CM

## 2015-11-26 DIAGNOSIS — I4891 Unspecified atrial fibrillation: Secondary | ICD-10-CM

## 2015-11-26 DIAGNOSIS — Z7901 Long term (current) use of anticoagulants: Secondary | ICD-10-CM | POA: Diagnosis not present

## 2015-11-26 LAB — POCT INR: INR: 2.9

## 2015-12-15 DIAGNOSIS — L570 Actinic keratosis: Secondary | ICD-10-CM | POA: Diagnosis not present

## 2015-12-21 ENCOUNTER — Other Ambulatory Visit: Payer: Self-pay | Admitting: Cardiology

## 2015-12-21 NOTE — Telephone Encounter (Signed)
Rx request sent to pharmacy.  

## 2015-12-22 DIAGNOSIS — I10 Essential (primary) hypertension: Secondary | ICD-10-CM | POA: Diagnosis not present

## 2015-12-22 DIAGNOSIS — E782 Mixed hyperlipidemia: Secondary | ICD-10-CM | POA: Diagnosis not present

## 2015-12-22 DIAGNOSIS — R7301 Impaired fasting glucose: Secondary | ICD-10-CM | POA: Diagnosis not present

## 2015-12-24 ENCOUNTER — Ambulatory Visit (INDEPENDENT_AMBULATORY_CARE_PROVIDER_SITE_OTHER): Payer: Medicare Other | Admitting: Pharmacist Clinician (PhC)/ Clinical Pharmacy Specialist

## 2015-12-24 DIAGNOSIS — Z7901 Long term (current) use of anticoagulants: Secondary | ICD-10-CM | POA: Diagnosis not present

## 2015-12-24 DIAGNOSIS — I4891 Unspecified atrial fibrillation: Secondary | ICD-10-CM | POA: Diagnosis not present

## 2015-12-24 DIAGNOSIS — E782 Mixed hyperlipidemia: Secondary | ICD-10-CM | POA: Diagnosis not present

## 2015-12-24 DIAGNOSIS — I482 Chronic atrial fibrillation, unspecified: Secondary | ICD-10-CM

## 2015-12-24 DIAGNOSIS — I10 Essential (primary) hypertension: Secondary | ICD-10-CM | POA: Diagnosis not present

## 2015-12-24 DIAGNOSIS — Z5181 Encounter for therapeutic drug level monitoring: Secondary | ICD-10-CM

## 2015-12-24 DIAGNOSIS — E559 Vitamin D deficiency, unspecified: Secondary | ICD-10-CM | POA: Diagnosis not present

## 2015-12-24 DIAGNOSIS — R7309 Other abnormal glucose: Secondary | ICD-10-CM | POA: Diagnosis not present

## 2015-12-24 DIAGNOSIS — R7301 Impaired fasting glucose: Secondary | ICD-10-CM | POA: Diagnosis not present

## 2015-12-24 LAB — POCT INR: INR: 2.5

## 2015-12-25 ENCOUNTER — Telehealth: Payer: Self-pay | Admitting: Cardiology

## 2015-12-25 NOTE — Telephone Encounter (Signed)
12/25/2015 Received faxed records packet   from [facility] apointments schedule with [facility] for coumadin check on 01/21/2016 and will see Dr. [name] on 03/24/2016.  cbr

## 2015-12-31 DIAGNOSIS — Z79891 Long term (current) use of opiate analgesic: Secondary | ICD-10-CM | POA: Diagnosis not present

## 2015-12-31 DIAGNOSIS — G894 Chronic pain syndrome: Secondary | ICD-10-CM | POA: Diagnosis not present

## 2015-12-31 DIAGNOSIS — M5136 Other intervertebral disc degeneration, lumbar region: Secondary | ICD-10-CM | POA: Diagnosis not present

## 2015-12-31 DIAGNOSIS — M5030 Other cervical disc degeneration, unspecified cervical region: Secondary | ICD-10-CM | POA: Diagnosis not present

## 2016-01-05 ENCOUNTER — Telehealth: Payer: Self-pay | Admitting: Diagnostic Neuroimaging

## 2016-01-05 NOTE — Telephone Encounter (Signed)
I received a referral from Dr. Nelva Bush for [name] and he is referring her to Dr. Jaynee Eagles. She last saw you in 2015. I called the patient to schedule her with you and she said she does not want to schedule with you. Is it ok to change this  patient to another doctor. Please advise. Thanks Diane

## 2016-01-14 NOTE — Telephone Encounter (Signed)
I will discuss with Dr. Jaynee Eagles. -VRP

## 2016-01-17 ENCOUNTER — Other Ambulatory Visit: Payer: Self-pay | Admitting: Cardiology

## 2016-01-18 NOTE — Telephone Encounter (Signed)
REFILL 

## 2016-01-21 ENCOUNTER — Ambulatory Visit (INDEPENDENT_AMBULATORY_CARE_PROVIDER_SITE_OTHER): Payer: Medicare Other | Admitting: Pharmacist Clinician (PhC)/ Clinical Pharmacy Specialist

## 2016-01-21 DIAGNOSIS — Z7901 Long term (current) use of anticoagulants: Secondary | ICD-10-CM | POA: Diagnosis not present

## 2016-01-21 DIAGNOSIS — M1711 Unilateral primary osteoarthritis, right knee: Secondary | ICD-10-CM | POA: Diagnosis not present

## 2016-01-21 DIAGNOSIS — Z5181 Encounter for therapeutic drug level monitoring: Secondary | ICD-10-CM

## 2016-01-21 DIAGNOSIS — I482 Chronic atrial fibrillation, unspecified: Secondary | ICD-10-CM

## 2016-01-21 DIAGNOSIS — I4891 Unspecified atrial fibrillation: Secondary | ICD-10-CM | POA: Diagnosis not present

## 2016-01-21 DIAGNOSIS — M1712 Unilateral primary osteoarthritis, left knee: Secondary | ICD-10-CM | POA: Diagnosis not present

## 2016-01-21 DIAGNOSIS — H40013 Open angle with borderline findings, low risk, bilateral: Secondary | ICD-10-CM | POA: Diagnosis not present

## 2016-01-21 LAB — POCT INR: INR: 2.9

## 2016-01-25 ENCOUNTER — Telehealth: Payer: Self-pay | Admitting: Cardiology

## 2016-01-25 NOTE — Telephone Encounter (Signed)
New Message  Pt requested to speak w/Rn concerning recent breathing issues she is having- did not want earlier appt w/ [name]- is keeping 5/18 appt for now- requested to speak w/ RN. Please call back and discuss.

## 2016-01-25 NOTE — Telephone Encounter (Signed)
Pt did not want to take earlier appt.  Offered waitlist. She declined this as well. We discussed pt's self-reported dyspnea. She describes chronic SOB as becoming a little more noticeable over a period of weeks. She states she feels well enough to f/u in May. Advised to call sooner if increase in noticeable concerns. Keep f/u visit. If any increase in swelling we could consider incr in lasix dose. If other problems, call sooner. Pt voiced understanding.

## 2016-02-03 DIAGNOSIS — E559 Vitamin D deficiency, unspecified: Secondary | ICD-10-CM | POA: Diagnosis not present

## 2016-02-03 DIAGNOSIS — Z79899 Other long term (current) drug therapy: Secondary | ICD-10-CM | POA: Diagnosis not present

## 2016-02-03 DIAGNOSIS — R7309 Other abnormal glucose: Secondary | ICD-10-CM | POA: Diagnosis not present

## 2016-02-03 DIAGNOSIS — Z0000 Encounter for general adult medical examination without abnormal findings: Secondary | ICD-10-CM | POA: Diagnosis not present

## 2016-02-03 DIAGNOSIS — I119 Hypertensive heart disease without heart failure: Secondary | ICD-10-CM | POA: Diagnosis not present

## 2016-02-04 ENCOUNTER — Telehealth: Payer: Self-pay

## 2016-02-04 NOTE — Telephone Encounter (Signed)
Received medical clearance from [facility].Dr.Jordan advised ok to hold coumadin 5 days prior to injection.Form faxed back to fax # [phone].

## 2016-02-15 ENCOUNTER — Other Ambulatory Visit: Payer: Self-pay | Admitting: *Deleted

## 2016-02-15 MED ORDER — DILTIAZEM HCL ER COATED BEADS 120 MG PO CP24: 120.0000 mg | ORAL_CAPSULE | Freq: Every day | ORAL | Status: DC

## 2016-02-16 ENCOUNTER — Other Ambulatory Visit: Payer: Self-pay | Admitting: Pharmacist Clinician (PhC)/ Clinical Pharmacy Specialist

## 2016-02-16 MED ORDER — WARFARIN SODIUM 5 MG PO TABS: ORAL_TABLET | ORAL | Status: DC

## 2016-02-23 ENCOUNTER — Encounter: Payer: Medicare Other | Admitting: Pharmacist Clinician (PhC)/ Clinical Pharmacy Specialist

## 2016-02-24 ENCOUNTER — Telehealth: Payer: Self-pay | Admitting: Cardiology

## 2016-02-24 NOTE — Telephone Encounter (Signed)
No answer. Left message to call back.   

## 2016-02-24 NOTE — Telephone Encounter (Signed)
I would just keep her off coumadin until next week.  Cartrell Bentsen [name] MD, [facility]

## 2016-02-24 NOTE — Telephone Encounter (Signed)
[name] was to have her INR checked on today so that she can have an injection done next week . She will be off of her coumadin for ten days when she gets her injection on next week , Please call Estill Bamberg at [place]   Thanks

## 2016-02-24 NOTE — Telephone Encounter (Signed)
Estill Bamberg called back. Pt has been off her coumadin since 02/20/16 in preparation for having an injection today. However, the pt did not have the injection today and is rescheduled for 02/29/16.   She has currently been off her coumadin for 5 days. Please advise as to whether pt can continue to remain off coumadin until after the injection on Monday or if she should restart her coumadin for a time and then have the injection as a later time.  Routing to Dr [name] and Malachy Mood P.

## 2016-02-24 NOTE — Telephone Encounter (Signed)
Called [name] back to give her Dr Doug Sou recommendations.  Pt scheduled on 03/02/16 for lumbar injection at 9:30. Pt scheduled to have INR checked just prior to injection.   [name] verbalized understanding and will contact pt.

## 2016-02-25 ENCOUNTER — Encounter: Payer: Medicare Other | Admitting: Pharmacist Clinician (PhC)/ Clinical Pharmacy Specialist

## 2016-02-29 DIAGNOSIS — Z01419 Encounter for gynecological examination (general) (routine) without abnormal findings: Secondary | ICD-10-CM | POA: Diagnosis not present

## 2016-02-29 DIAGNOSIS — Z1389 Encounter for screening for other disorder: Secondary | ICD-10-CM | POA: Diagnosis not present

## 2016-02-29 DIAGNOSIS — Z6826 Body mass index (BMI) 26.0-26.9, adult: Secondary | ICD-10-CM | POA: Diagnosis not present

## 2016-02-29 DIAGNOSIS — Z79890 Hormone replacement therapy (postmenopausal): Secondary | ICD-10-CM | POA: Diagnosis not present

## 2016-03-02 ENCOUNTER — Ambulatory Visit (INDEPENDENT_AMBULATORY_CARE_PROVIDER_SITE_OTHER): Payer: Medicare Other | Admitting: Pharmacist Clinician (PhC)/ Clinical Pharmacy Specialist

## 2016-03-02 DIAGNOSIS — Z7901 Long term (current) use of anticoagulants: Secondary | ICD-10-CM | POA: Diagnosis not present

## 2016-03-02 DIAGNOSIS — M5136 Other intervertebral disc degeneration, lumbar region: Secondary | ICD-10-CM | POA: Diagnosis not present

## 2016-03-02 DIAGNOSIS — Z5181 Encounter for therapeutic drug level monitoring: Secondary | ICD-10-CM | POA: Diagnosis not present

## 2016-03-02 DIAGNOSIS — I4891 Unspecified atrial fibrillation: Secondary | ICD-10-CM

## 2016-03-02 DIAGNOSIS — I482 Chronic atrial fibrillation, unspecified: Secondary | ICD-10-CM

## 2016-03-02 LAB — POCT INR: INR: 1

## 2016-03-20 ENCOUNTER — Other Ambulatory Visit: Payer: Self-pay | Admitting: Cardiology

## 2016-03-21 NOTE — Telephone Encounter (Signed)
Rx request sent to pharmacy.  

## 2016-03-24 ENCOUNTER — Encounter: Payer: Self-pay | Admitting: Cardiology

## 2016-03-24 ENCOUNTER — Ambulatory Visit (INDEPENDENT_AMBULATORY_CARE_PROVIDER_SITE_OTHER): Payer: Medicare Other | Admitting: Pharmacist Clinician (PhC)/ Clinical Pharmacy Specialist

## 2016-03-24 ENCOUNTER — Ambulatory Visit (INDEPENDENT_AMBULATORY_CARE_PROVIDER_SITE_OTHER): Payer: Medicare Other | Admitting: Cardiology

## 2016-03-24 VITALS — BP 128/72 | HR 92 | Ht 65.0 in | Wt 158.0 lb

## 2016-03-24 DIAGNOSIS — I272 Other secondary pulmonary hypertension: Secondary | ICD-10-CM

## 2016-03-24 DIAGNOSIS — J449 Chronic obstructive pulmonary disease, unspecified: Secondary | ICD-10-CM

## 2016-03-24 DIAGNOSIS — M7061 Trochanteric bursitis, right hip: Secondary | ICD-10-CM | POA: Diagnosis not present

## 2016-03-24 DIAGNOSIS — I10 Essential (primary) hypertension: Secondary | ICD-10-CM | POA: Diagnosis not present

## 2016-03-24 DIAGNOSIS — Z79891 Long term (current) use of opiate analgesic: Secondary | ICD-10-CM | POA: Diagnosis not present

## 2016-03-24 DIAGNOSIS — I482 Chronic atrial fibrillation, unspecified: Secondary | ICD-10-CM

## 2016-03-24 DIAGNOSIS — Z7901 Long term (current) use of anticoagulants: Secondary | ICD-10-CM

## 2016-03-24 DIAGNOSIS — J4489 Other specified chronic obstructive pulmonary disease: Secondary | ICD-10-CM | POA: Insufficient documentation

## 2016-03-24 DIAGNOSIS — M5136 Other intervertebral disc degeneration, lumbar region: Secondary | ICD-10-CM | POA: Diagnosis not present

## 2016-03-24 DIAGNOSIS — I4891 Unspecified atrial fibrillation: Secondary | ICD-10-CM | POA: Diagnosis not present

## 2016-03-24 DIAGNOSIS — Z5181 Encounter for therapeutic drug level monitoring: Secondary | ICD-10-CM

## 2016-03-24 LAB — POCT INR: INR: 2.2

## 2016-03-24 MED ORDER — ALBUTEROL SULFATE HFA 108 (90 BASE) MCG/ACT IN AERS: 2.0000 | INHALATION_SPRAY | Freq: Four times a day (QID) | RESPIRATORY_TRACT | Status: DC | PRN

## 2016-03-24 NOTE — Patient Instructions (Signed)
Continue your current therapy  Try Albuterol inhaler 2 puffs every 6 hours as needed.  I will see you in 6 months

## 2016-03-24 NOTE — Progress Notes (Signed)
[name] Date of Birth: 1932/01/19   History of Present Illness: [name] is seen for follow up Afib. She has a history of chronic atrial fibrillation, hypertensive heart disease, and pulmonary hypertension. She has a history of chronic back problems. She has moderate MR and COPD.  She reports increased SOB over the last 4-5 months. This is worse when the pollen is high or when the weather is hot. No chest pain. No cough. No edema. Weight has been stable. She has had to reduce her walking due to SOB, back and foot pain.    Current Outpatient Prescriptions on File Prior to Visit  Medication Sig Dispense Refill  . Ascorbic Acid (VITAMIN C) 1000 MG tablet Take 1,000 mg by mouth daily.      Marland Kitchen atorvastatin (LIPITOR) 20 MG tablet Take 1 tablet by mouth Daily.    . Calcium Carbonate-Vitamin D (CALCIUM + D PO) Take by mouth daily.      . Cholecalciferol (VITAMIN D PO) Take by mouth daily.      . Cyanocobalamin (VITAMIN B-12 PO) Take by mouth as directed.    . diltiazem (CARDIZEM CD) 120 MG 24 hr capsule Take 1 capsule (120 mg total) by mouth daily. Please schedule appointment for refills. 30 capsule 0  . estrogens, conjugated, (PREMARIN) 1.25 MG tablet Take 1.25 mg by mouth daily.      . furosemide (LASIX) 40 MG tablet TAKE 2 TABLETS EVERY DAY IN THE MORNING AND TAKE 1 TABLET IN THE EVENING 90 tablet 2  . gabapentin (NEURONTIN) 100 MG capsule Take 1 tablet by mouth 2 (two) times daily.     Marland Kitchen HYDROcodone-acetaminophen (NORCO) 10-325 MG per tablet Take 1 tablet by mouth every 6 (six) hours as needed.    Marland Kitchen lisinopril (PRINIVIL,ZESTRIL) 20 MG tablet Take 1 tablet by mouth Daily.    . Multiple Vitamins-Minerals (ZINC PO) Take by mouth daily.      Marland Kitchen MYRBETRIQ 25 MG TB24 tablet Take 25 mg by mouth daily.    . Potassium (POTASSIMIN PO) Take 595 mg by mouth daily. 1 Tablet daily    . sertraline (ZOLOFT) 50 MG tablet Take 1 tablet by mouth daily.  3  . valACYclovir (VALTREX) 500 MG tablet      . vitamin A 8000 UNIT capsule Take 8,000 Units by mouth daily.      Marland Kitchen warfarin (COUMADIN) 5 MG tablet Take 1 tablet by mouth daily or as directed by coumadin clinic 30 tablet 3  . [DISCONTINUED] diltiazem (CARDIZEM) 120 MG tablet Take 1 tablet (120 mg total) by mouth daily. 30 tablet 5   No current facility-administered medications on file prior to visit.    Allergies  Allergen Reactions  . Codeine Nausea And Vomiting  . Penicillins   . Sulfonamide Derivatives Rash    Past Medical History  Diagnosis Date  . Mitral insufficiency     CHRONIC  . Hypertensive heart disease     WITH LVH  . LVH (left ventricular hypertrophy) due to hypertensive disease   . Chronic atrial fibrillation ([place])   . Pulmonary hypertension ([place])   . Hypercholesterolemia   . Back pain, chronic   . Depression   . Coronary artery disease CARDIOLOGIST- DR [name]--  LOV IN EPIC  . Anticoagulated on Coumadin     CHRONIC  . Arthritis   . Anemia   . Hypertension   . Hammer toe   . Transfusion history     Past Surgical History  Procedure  Laterality Date  . Total hip arthroplasty      BILATERAL---  LEFT 4/98;   RIGHT  11/98  . Abdominal hysterectomy    . Varus osteotomy proximal phalanx, left great toe/ paring down the callus , second left toe  10-27-2008  . Repair right second claw toe/ varus medial closing wedge osteotomy proximal phalanx right great toe  05-05-2005  . Anterior repair/ sparc pubovaginal sling  05-03-2002  . Pars plana vitrectomy w/ repair of macular hole  02-04-2002    LEFT EYE  . Cardiac catheterization  02-03-2009  DR  [name]    SINGLE-VESSEL OBSTRUCTIVE CAD, SMALL DIAGONAL BRANCH/ NORMAL LVF/ SEVERE MITRAL INSUFFICIENCY/ SEVERE PULMONARY HYPERTENSION  . Cataract extraction w/ intraocular lens  implant, bilateral  2003  . Hammer toe surgery  02/28/2012    Procedure: HAMMER TOE CORRECTION;  Surgeon: [name], MD;  Location: [facility];  Service: Orthopedics;   Laterality: Right;  FUSION OF PROXIMAL INTERPHALANGEAL  RIGHT THIRD CLAW TOE  . Bunionectomy with hammertoe reconstruction Right 03/01/2013    Procedure: RIGHT FOOT EXCISION BUNIONETTE AND FUSION OF DIP FOURTH TOE;  Surgeon: [name], MD;  Location: [place];  Service: Orthopedics;  Laterality: Right;    History  Smoking status  . Never Smoker   Smokeless tobacco  . Never Used    History  Alcohol Use  . Yes    Comment: occasional    Family History  Problem Relation Age of Onset  . Heart disease Mother 50  . Prostate cancer Father 58    Review of Systems:  As noted in history of present illness.  All other systems were reviewed and are negative.  Physical Exam: BP 128/72 mmHg  Pulse 92  Ht 5\' 5"  (1.651 m)  Wt 71.668 kg (158 lb)  BMI 26.29 kg/m2 She is a pleasant white female in no acute distress. Her HEENT exam is unremarkable. Sclera are clear. Oropharynx is clear. Neck is supple without JVD, adenopathy, thyromegaly, or bruits. Lungs are clear with scant wheeze. Cardiac exam reveals a regular rate and rhythm without gallop. There is a grade 2/6 systolic murmur heard best at the left sternal border. Abdomen is soft and nontender. She has no significant edema. DP and PT pulses are palpable. She does have fairly extensive venous varicosities with some dependent bluish discoloration of her toes.  She is alert and oriented x3. Cranial nerves II through XII are intact.  LABORATORY DATA: INR today is 2.2   Ecg today shows Atrial fibrillation with rate 92. Nonspecific ST-T wave changes. I have personally reviewed and interpreted this study.   Assessment / Plan: 1. Atrial fibrillation, permanent. Rate is well controlled on diltiazem. She is anticoagulated on Coumadin.   2. Mild to Moderate MR. Last Echo 8/16.   3. Hypertension with hypertensive heart disease, well controlled.  4. Hyperlipidemia on statin therapy.   5. Pulmonary  hypertension-moderate  6. Coronary disease. Cardiac catheterization in 2010 showed severe stenosis in a small diagonal branch. She otherwise had no significant disease. She is asymptomatic.   7. DOE. I think this is predominantly related to pulmonary with chronic bronchitis and COPD. She does not want to use an inhaler. I prescribed an albuterol inhaler prn.

## 2016-04-19 ENCOUNTER — Other Ambulatory Visit: Payer: Self-pay | Admitting: Cardiology

## 2016-04-19 NOTE — Telephone Encounter (Signed)
Rx request sent to pharmacy.  

## 2016-04-22 ENCOUNTER — Other Ambulatory Visit: Payer: Self-pay

## 2016-04-22 MED ORDER — DILTIAZEM HCL ER COATED BEADS 120 MG PO CP24: ORAL_CAPSULE | ORAL | Status: DC

## 2016-04-22 MED ORDER — LISINOPRIL 20 MG PO TABS: 20.0000 mg | ORAL_TABLET | Freq: Every day | ORAL | Status: DC

## 2016-04-28 ENCOUNTER — Ambulatory Visit (INDEPENDENT_AMBULATORY_CARE_PROVIDER_SITE_OTHER): Payer: Medicare Other | Admitting: Pharmacist Clinician (PhC)/ Clinical Pharmacy Specialist

## 2016-04-28 DIAGNOSIS — I482 Chronic atrial fibrillation, unspecified: Secondary | ICD-10-CM

## 2016-04-28 DIAGNOSIS — Z5181 Encounter for therapeutic drug level monitoring: Secondary | ICD-10-CM | POA: Diagnosis not present

## 2016-04-28 DIAGNOSIS — I4891 Unspecified atrial fibrillation: Secondary | ICD-10-CM | POA: Diagnosis not present

## 2016-04-28 DIAGNOSIS — Z7901 Long term (current) use of anticoagulants: Secondary | ICD-10-CM | POA: Diagnosis not present

## 2016-04-28 LAB — POCT INR: INR: 2.5

## 2016-05-03 DIAGNOSIS — M7061 Trochanteric bursitis, right hip: Secondary | ICD-10-CM | POA: Diagnosis not present

## 2016-05-26 ENCOUNTER — Ambulatory Visit (INDEPENDENT_AMBULATORY_CARE_PROVIDER_SITE_OTHER): Payer: Medicare Other | Admitting: Pharmacist Clinician (PhC)/ Clinical Pharmacy Specialist

## 2016-05-26 DIAGNOSIS — I4891 Unspecified atrial fibrillation: Secondary | ICD-10-CM | POA: Diagnosis not present

## 2016-05-26 DIAGNOSIS — Z7901 Long term (current) use of anticoagulants: Secondary | ICD-10-CM | POA: Diagnosis not present

## 2016-05-26 DIAGNOSIS — I482 Chronic atrial fibrillation, unspecified: Secondary | ICD-10-CM

## 2016-05-26 DIAGNOSIS — Z5181 Encounter for therapeutic drug level monitoring: Secondary | ICD-10-CM | POA: Diagnosis not present

## 2016-05-26 LAB — POCT INR: INR: 3.2

## 2016-05-30 ENCOUNTER — Telehealth: Payer: Self-pay | Admitting: Cardiology

## 2016-05-30 NOTE — Telephone Encounter (Signed)
Returned call to patient. She has weaned herself down to 2 lasix daily. She has no swelling, weight stable, always short of breath (not new). She wants to know if she can wean off this medication altogether.   Patient was Rx'ed lasix 40mg  - take 2 in the morning, 1 in the evening. She has been taking 2 tablets in the morning. She wants to know if she can wean down to 1 tablet daily.   Informed patient I will have to defer to MD to advise on this. She is aware Dr. [name] is OOO so the message will be sent to him & DOD (Dr. Claiborne Billings)

## 2016-05-30 NOTE — Telephone Encounter (Signed)
[name] is calling about her Lasik . She is only taking one a day  and will probably will stop taking it all together today . She is now going to bathroom regularly and not  all the time and no swelling because she has cut down the amount she was taking . Please call if you have any questions .  Thanks

## 2016-06-05 NOTE — Telephone Encounter (Signed)
We can try and reduce to 20 mg daily and see how she does. Keep an eye on weight and any swelling.  Peter [name] MD, [facility]

## 2016-06-09 ENCOUNTER — Other Ambulatory Visit: Payer: Self-pay

## 2016-06-09 NOTE — Telephone Encounter (Signed)
Returned call to patient.She will decrease lasix to 20 mg daily.Advised to call back if she has increased weight or swelling.Follow up appointment scheduled with Dr.Jordan 09/26/16 at 9:30 am.

## 2016-06-17 ENCOUNTER — Telehealth: Payer: Self-pay

## 2016-06-17 NOTE — Telephone Encounter (Signed)
Received medical clearance from [facility].Dr.Jordan advised ok to hold warfarin 5 days before injection.

## 2016-06-21 ENCOUNTER — Other Ambulatory Visit: Payer: Self-pay | Admitting: Cardiology

## 2016-06-23 ENCOUNTER — Ambulatory Visit (INDEPENDENT_AMBULATORY_CARE_PROVIDER_SITE_OTHER): Payer: Medicare Other | Admitting: Pharmacist

## 2016-06-23 DIAGNOSIS — Z5181 Encounter for therapeutic drug level monitoring: Secondary | ICD-10-CM

## 2016-06-23 DIAGNOSIS — I482 Chronic atrial fibrillation, unspecified: Secondary | ICD-10-CM

## 2016-06-23 DIAGNOSIS — M5030 Other cervical disc degeneration, unspecified cervical region: Secondary | ICD-10-CM | POA: Diagnosis not present

## 2016-06-23 DIAGNOSIS — I4891 Unspecified atrial fibrillation: Secondary | ICD-10-CM

## 2016-06-23 DIAGNOSIS — G894 Chronic pain syndrome: Secondary | ICD-10-CM | POA: Diagnosis not present

## 2016-06-23 DIAGNOSIS — M5136 Other intervertebral disc degeneration, lumbar region: Secondary | ICD-10-CM | POA: Diagnosis not present

## 2016-06-23 DIAGNOSIS — Z7901 Long term (current) use of anticoagulants: Secondary | ICD-10-CM | POA: Diagnosis not present

## 2016-06-23 DIAGNOSIS — Z79891 Long term (current) use of opiate analgesic: Secondary | ICD-10-CM | POA: Diagnosis not present

## 2016-06-23 LAB — POCT INR: INR: 2.7

## 2016-06-29 ENCOUNTER — Ambulatory Visit: Payer: Medicare Other

## 2016-06-29 DIAGNOSIS — M5416 Radiculopathy, lumbar region: Secondary | ICD-10-CM | POA: Diagnosis not present

## 2016-07-14 DIAGNOSIS — L728 Other follicular cysts of the skin and subcutaneous tissue: Secondary | ICD-10-CM | POA: Diagnosis not present

## 2016-07-14 DIAGNOSIS — L300 Nummular dermatitis: Secondary | ICD-10-CM | POA: Diagnosis not present

## 2016-07-14 DIAGNOSIS — L853 Xerosis cutis: Secondary | ICD-10-CM | POA: Diagnosis not present

## 2016-07-14 DIAGNOSIS — L820 Inflamed seborrheic keratosis: Secondary | ICD-10-CM | POA: Diagnosis not present

## 2016-07-27 ENCOUNTER — Ambulatory Visit (INDEPENDENT_AMBULATORY_CARE_PROVIDER_SITE_OTHER): Payer: Medicare Other | Admitting: Pharmacist Clinician (PhC)/ Clinical Pharmacy Specialist

## 2016-07-27 ENCOUNTER — Encounter: Payer: Self-pay | Admitting: Cardiology

## 2016-07-27 DIAGNOSIS — I482 Chronic atrial fibrillation, unspecified: Secondary | ICD-10-CM

## 2016-07-27 DIAGNOSIS — Z5181 Encounter for therapeutic drug level monitoring: Secondary | ICD-10-CM | POA: Diagnosis not present

## 2016-07-27 DIAGNOSIS — E782 Mixed hyperlipidemia: Secondary | ICD-10-CM | POA: Diagnosis not present

## 2016-07-27 DIAGNOSIS — Z7901 Long term (current) use of anticoagulants: Secondary | ICD-10-CM

## 2016-07-27 DIAGNOSIS — I10 Essential (primary) hypertension: Secondary | ICD-10-CM | POA: Diagnosis not present

## 2016-07-27 DIAGNOSIS — I4891 Unspecified atrial fibrillation: Secondary | ICD-10-CM | POA: Diagnosis not present

## 2016-07-27 DIAGNOSIS — E559 Vitamin D deficiency, unspecified: Secondary | ICD-10-CM | POA: Diagnosis not present

## 2016-07-27 DIAGNOSIS — R7301 Impaired fasting glucose: Secondary | ICD-10-CM | POA: Diagnosis not present

## 2016-07-27 LAB — POCT INR: INR: 2.8

## 2016-07-29 ENCOUNTER — Telehealth: Payer: Self-pay | Admitting: Cardiology

## 2016-07-29 DIAGNOSIS — R7301 Impaired fasting glucose: Secondary | ICD-10-CM | POA: Diagnosis not present

## 2016-07-29 DIAGNOSIS — E782 Mixed hyperlipidemia: Secondary | ICD-10-CM | POA: Diagnosis not present

## 2016-07-29 DIAGNOSIS — Z23 Encounter for immunization: Secondary | ICD-10-CM | POA: Diagnosis not present

## 2016-07-29 DIAGNOSIS — I10 Essential (primary) hypertension: Secondary | ICD-10-CM | POA: Diagnosis not present

## 2016-07-29 DIAGNOSIS — I340 Nonrheumatic mitral (valve) insufficiency: Secondary | ICD-10-CM

## 2016-07-29 DIAGNOSIS — I482 Chronic atrial fibrillation, unspecified: Secondary | ICD-10-CM

## 2016-07-29 DIAGNOSIS — R7309 Other abnormal glucose: Secondary | ICD-10-CM | POA: Diagnosis not present

## 2016-07-29 DIAGNOSIS — E559 Vitamin D deficiency, unspecified: Secondary | ICD-10-CM | POA: Diagnosis not present

## 2016-07-29 NOTE — Telephone Encounter (Signed)
Received call from patient.She stated she saw Dr.Michael Altheimer this morning.Stated her potassium was 3.2.Stated he increased her potassium 99 mg to 5 tablets per day and he told her to ask Dr.Jordan about decreasing lasix.Stated she is currently taking lasix 40 mg 1 tablet in am and 1&11/2 tablets in pm.Advised on 05/30/16 Dr.Jordan decreased lasix to 20 mg daily.Stated she never decreased.Advised to take lasix 20 mg daily.Advised she needs to repeat potassium blood work in 1 week.Stated Dr.Alheimer told her to have done in 11/2016.  Spoke to [place] with Dr.Altheimer's office.She stated Dr.Alheimer advised patient to call and ask Dr.Jordan to prescribe a prescription potassium.He advised her today to take potassium 99 mg 5 tablets daily and decrease lasix to 20 mg daily.[name] will fax lab for Dr.Jordan to review.Advised Dr.Jordan out of office today,I will speak to Dr.Jordan on Monday 08/01/16.

## 2016-07-29 NOTE — Telephone Encounter (Signed)
Please call,she wants to make sure you get a message to Dr [name] please.

## 2016-08-01 NOTE — Telephone Encounter (Signed)
Returned call to patient left message on her personal voice mail.I never received Dr.Altheimer's recent lab.I will call his office again and request a copy for Dr.Jordan to review.

## 2016-08-03 ENCOUNTER — Telehealth: Payer: Self-pay | Admitting: Cardiology

## 2016-08-03 DIAGNOSIS — I119 Hypertensive heart disease without heart failure: Secondary | ICD-10-CM | POA: Diagnosis not present

## 2016-08-03 DIAGNOSIS — R7309 Other abnormal glucose: Secondary | ICD-10-CM | POA: Diagnosis not present

## 2016-08-03 DIAGNOSIS — E784 Other hyperlipidemia: Secondary | ICD-10-CM | POA: Diagnosis not present

## 2016-08-03 DIAGNOSIS — Z23 Encounter for immunization: Secondary | ICD-10-CM | POA: Diagnosis not present

## 2016-08-03 DIAGNOSIS — I4891 Unspecified atrial fibrillation: Secondary | ICD-10-CM | POA: Diagnosis not present

## 2016-08-03 NOTE — Telephone Encounter (Signed)
Records received from [facility] for apt on 08/25/16 with Dr [name]. Records given to [employer] (medical records) CN

## 2016-08-04 MED ORDER — POTASSIUM CHLORIDE CRYS ER 20 MEQ PO TBCR: 20.0000 meq | EXTENDED_RELEASE_TABLET | Freq: Two times a day (BID) | ORAL | 6 refills | Status: DC

## 2016-08-04 NOTE — Telephone Encounter (Signed)
Returned call to patient.Dr.Jordan advised to stop OTC potassium.Start Kdur 20 meq twice a day.Repeat bmet in 2 weeks.Lab order mailed.

## 2016-08-15 ENCOUNTER — Telehealth: Payer: Self-pay | Admitting: Cardiology

## 2016-08-15 NOTE — Telephone Encounter (Signed)
Please call her,she thinks she is having a reaction to her Potassium medicine.

## 2016-08-15 NOTE — Telephone Encounter (Signed)
Pt returned call call to Cheryl-pls call

## 2016-08-15 NOTE — Telephone Encounter (Signed)
Follow up    Pt returned call call to Cheryl-pls call

## 2016-08-15 NOTE — Telephone Encounter (Signed)
Follow up   Pt verbalized that she forgot to mention in the earlier message that    Pt c/o medication issue:  1. Name of Medication: clora m-20 tablets & potassium  2. How are you currently taking this medication (dosage and times per day)? 2x day   3. Are you having a reaction (difficulty breathing--STAT)? no  4. What is your medication issue? listlessness and ver little confusion weakness of legs

## 2016-08-16 ENCOUNTER — Telehealth: Payer: Self-pay | Admitting: Cardiology

## 2016-08-16 NOTE — Telephone Encounter (Signed)
Returned call to patient 08/15/16.She stated she is having weakness in both legs,can hardly walk.Stated weakness started when she started taking Kdur .Stated she stopped taking on Sat 08/13/16.She continues to have weakness in both legs,can hardly walk.Spoke to [place] he advised to restart Kdur.Advised to stop lipitor.Advised to call back in 2 weeks to let us know if she continues to have leg weakness.

## 2016-08-16 NOTE — Telephone Encounter (Signed)
[name] is calling because she is on lasix and potassium and  Dr. [name] has taking her off of the cholesterol medication and she is needing to know if  Dr. [name] going to put her on a another cholesterol medication . Please  Call  Thanks

## 2016-08-16 NOTE — Telephone Encounter (Signed)
Returned call to patient.She stated she stopped taking Atorvastatin yesterday and she notices legs feeling better today.She was wanting to know if she needs to take another cholesterol medication.Advised she needs to be off Atorvastatin for 2 weeks then call back and let me know how she is doing.Then we will ask Dr.Jordan to prescribe another statin.

## 2016-08-22 ENCOUNTER — Telehealth: Payer: Self-pay | Admitting: Cardiology

## 2016-08-22 NOTE — Telephone Encounter (Signed)
New Message  Pt call requesting to speak with RN to request a prescription for a walker. Please call back to discuss

## 2016-08-22 NOTE — Telephone Encounter (Signed)
Returned call to patient.She stated she needs a letter to purchase a walker with a seat.Stated she is unable to receive a call back from her PCP.Stated since she stopped lipitor 1 week ago weakness in legs feel alittle better.Stated she has been having tightness in left calf for the past 1 year and numbness in both feet.Spoke to [place] he advised she has had lower ext dopplers which were normal.She has appointment with Dr.Ramos this week.Advised to speak to him about this.Advised I will leave letter to purchase walker at front desk.

## 2016-08-22 NOTE — Telephone Encounter (Signed)
called patient. Patient states she only wants to speak to [place].RN could not be any assistant to patient.  defer to call to [facility]

## 2016-08-25 ENCOUNTER — Ambulatory Visit (INDEPENDENT_AMBULATORY_CARE_PROVIDER_SITE_OTHER): Payer: Medicare Other | Admitting: Pharmacist Clinician (PhC)/ Clinical Pharmacy Specialist

## 2016-08-25 DIAGNOSIS — I340 Nonrheumatic mitral (valve) insufficiency: Secondary | ICD-10-CM | POA: Diagnosis not present

## 2016-08-25 DIAGNOSIS — M5136 Other intervertebral disc degeneration, lumbar region: Secondary | ICD-10-CM | POA: Diagnosis not present

## 2016-08-25 DIAGNOSIS — I482 Chronic atrial fibrillation, unspecified: Secondary | ICD-10-CM

## 2016-08-25 DIAGNOSIS — Z7901 Long term (current) use of anticoagulants: Secondary | ICD-10-CM

## 2016-08-25 DIAGNOSIS — M79605 Pain in left leg: Secondary | ICD-10-CM | POA: Diagnosis not present

## 2016-08-25 DIAGNOSIS — Z5181 Encounter for therapeutic drug level monitoring: Secondary | ICD-10-CM | POA: Diagnosis not present

## 2016-08-25 DIAGNOSIS — G894 Chronic pain syndrome: Secondary | ICD-10-CM | POA: Diagnosis not present

## 2016-08-25 DIAGNOSIS — I4891 Unspecified atrial fibrillation: Secondary | ICD-10-CM | POA: Diagnosis not present

## 2016-08-25 DIAGNOSIS — Z79891 Long term (current) use of opiate analgesic: Secondary | ICD-10-CM | POA: Diagnosis not present

## 2016-08-25 LAB — POCT INR: INR: 3.1

## 2016-08-26 LAB — BASIC METABOLIC PANEL
BUN: 18 mg/dL (ref 7–25)
CHLORIDE: 103 mmol/L (ref 98–110)
CO2: 24 mmol/L (ref 20–31)
CREATININE: 1.09 mg/dL — AB (ref 0.60–0.88)
Calcium: 11.1 mg/dL — ABNORMAL HIGH (ref 8.6–10.4)
GLUCOSE: 108 mg/dL — AB (ref 65–99)
POTASSIUM: 4.8 mmol/L (ref 3.5–5.3)
Sodium: 141 mmol/L (ref 135–146)

## 2016-08-29 ENCOUNTER — Telehealth: Payer: Self-pay | Admitting: Cardiology

## 2016-08-29 NOTE — Telephone Encounter (Signed)
Returned call to patient.She stated she already found answer to her question.Stated she can drive her self when she has a MRI on 09/02/16.

## 2016-08-29 NOTE — Telephone Encounter (Signed)
New message    Patient calling need to ask question regarding upcoming test.

## 2016-09-02 DIAGNOSIS — M79605 Pain in left leg: Secondary | ICD-10-CM | POA: Diagnosis not present

## 2016-09-05 ENCOUNTER — Telehealth: Payer: Self-pay | Admitting: Cardiology

## 2016-09-05 NOTE — Telephone Encounter (Signed)
New Message ° °Pt voiced returning nurses call. °

## 2016-09-05 NOTE — Telephone Encounter (Signed)
Returned call to patient no answer.LMTC. 

## 2016-09-05 NOTE — Telephone Encounter (Signed)
Called patient. She wanted to wait until Malachy Mood could call her back. She did not want to give any other information.  Routing to [place].

## 2016-09-05 NOTE — Telephone Encounter (Signed)
Returned call to patient.She stated she wanted to know if Dr.Jordan wants her to restart Lipitor.Stated she stopped taking 2 weeks ago she continues to have weakness in both legs.Stated she had a MRI this past Friday has not heard the results yet.Advised Dr.Jordan out of office this week.I will speak to him next week and call her back.

## 2016-09-05 NOTE — Telephone Encounter (Signed)
Follow Up:    Returning your girl.

## 2016-09-16 MED ORDER — FUROSEMIDE 40 MG PO TABS: ORAL_TABLET | ORAL | 6 refills | Status: DC

## 2016-09-16 NOTE — Telephone Encounter (Signed)
Returned call to patient.Spoke to [place] he advised to restart lipitor 20 mg daily.

## 2016-09-16 NOTE — Addendum Note (Signed)
Addended by: Kathyrn Lass on: 09/16/2016 06:29 PM   Modules accepted: Orders

## 2016-09-22 ENCOUNTER — Encounter: Payer: Self-pay | Admitting: Nurse Practitioner

## 2016-09-22 DIAGNOSIS — H353120 Nonexudative age-related macular degeneration, left eye, stage unspecified: Secondary | ICD-10-CM | POA: Diagnosis not present

## 2016-09-22 DIAGNOSIS — M79605 Pain in left leg: Secondary | ICD-10-CM | POA: Diagnosis not present

## 2016-09-22 DIAGNOSIS — H04123 Dry eye syndrome of bilateral lacrimal glands: Secondary | ICD-10-CM | POA: Diagnosis not present

## 2016-09-22 DIAGNOSIS — H40013 Open angle with borderline findings, low risk, bilateral: Secondary | ICD-10-CM | POA: Diagnosis not present

## 2016-09-22 DIAGNOSIS — Z1231 Encounter for screening mammogram for malignant neoplasm of breast: Secondary | ICD-10-CM | POA: Diagnosis not present

## 2016-09-22 DIAGNOSIS — M5136 Other intervertebral disc degeneration, lumbar region: Secondary | ICD-10-CM | POA: Diagnosis not present

## 2016-09-22 DIAGNOSIS — H353110 Nonexudative age-related macular degeneration, right eye, stage unspecified: Secondary | ICD-10-CM | POA: Diagnosis not present

## 2016-09-22 DIAGNOSIS — G894 Chronic pain syndrome: Secondary | ICD-10-CM | POA: Diagnosis not present

## 2016-09-22 DIAGNOSIS — Z79891 Long term (current) use of opiate analgesic: Secondary | ICD-10-CM | POA: Diagnosis not present

## 2016-09-23 NOTE — Progress Notes (Signed)
[name] Date of Birth: 1932-09-23   History of Present Illness: [name] is seen for follow up Afib. She has a history of chronic atrial fibrillation, hypertensive heart disease, and pulmonary hypertension. She has a history of chronic back problems. She has moderate MR and COPD. In October she developed progressive weakness in her legs. We held her lipitor without improvement. MRI was done. This showed some chronic fatty atrophy of the muscles c/w denervation in the peroneal nerve distribution.  She reports her breathing is doing very well. She has not had to use inhaler. This is worse when the pollen is high or when the weather is hot. No chest pain. No cough. No edema. Weight has been stable.    Current Outpatient Prescriptions on File Prior to Visit  Medication Sig Dispense Refill  . albuterol (PROVENTIL HFA;VENTOLIN HFA) 108 (90 Base) MCG/ACT inhaler Inhale 2 puffs into the lungs every 6 (six) hours as needed for wheezing or shortness of breath. 1 Inhaler 2  . Ascorbic Acid (VITAMIN C) 1000 MG tablet Take 1,000 mg by mouth daily.      Marland Kitchen atorvastatin (LIPITOR) 20 MG tablet Take 1 tablet (20 mg total) by mouth daily. 90 tablet 3  . Calcium Carbonate-Vitamin D (CALCIUM + D PO) Take by mouth daily.      . Cholecalciferol (VITAMIN D PO) Take by mouth daily.      . Cyanocobalamin (VITAMIN B-12 PO) Take by mouth as directed.    . diltiazem (CARDIZEM CD) 120 MG 24 hr capsule Take 120 mg daily 30 capsule 6  . estrogens, conjugated, (PREMARIN) 1.25 MG tablet Take 1.25 mg by mouth daily.      . furosemide (LASIX) 40 MG tablet Take 1/2 tablet daily ( 20 mg ) 30 tablet 6  . HYDROcodone-acetaminophen (NORCO) 10-325 MG per tablet Take 1 tablet by mouth every 6 (six) hours as needed.    Marland Kitchen lisinopril (PRINIVIL,ZESTRIL) 20 MG tablet Take 1 tablet (20 mg total) by mouth daily. 30 tablet 6  . Multiple Vitamins-Minerals (ZINC PO) Take by mouth daily.      Marland Kitchen MYRBETRIQ 25 MG TB24 tablet  Take 25 mg by mouth daily.    . potassium chloride SA (K-DUR,KLOR-CON) 20 MEQ tablet Take 1 tablet (20 mEq total) by mouth 2 (two) times daily. 60 tablet 6  . sertraline (ZOLOFT) 50 MG tablet Take 1 tablet by mouth daily.  3  . valACYclovir (VALTREX) 500 MG tablet     . vitamin A 8000 UNIT capsule Take 8,000 Units by mouth daily.      Marland Kitchen warfarin (COUMADIN) 5 MG tablet Take 1 tablet by mouth daily or as directed by coumadin clinic 30 tablet 3  . [DISCONTINUED] diltiazem (CARDIZEM) 120 MG tablet Take 1 tablet (120 mg total) by mouth daily. 30 tablet 5   No current facility-administered medications on file prior to visit.     Allergies  Allergen Reactions  . Codeine Nausea And Vomiting  . Penicillins   . Sulfonamide Derivatives Rash    Past Medical History:  Diagnosis Date  . Anemia   . Anticoagulated on Coumadin    CHRONIC  . Arthritis   . Back pain, chronic   . Chronic atrial fibrillation ([place])   . Coronary artery disease CARDIOLOGIST- DR [name]--  LOV IN EPIC  . Depression   . Hammer toe   . Hypercholesterolemia   . Hypertension   . Hypertensive heart disease    WITH LVH  .  LVH (left ventricular hypertrophy) due to hypertensive disease   . Mitral insufficiency    CHRONIC  . Pulmonary hypertension   . Transfusion history     Past Surgical History:  Procedure Laterality Date  . ABDOMINAL HYSTERECTOMY    . ANTERIOR REPAIR/ [place] PUBOVAGINAL SLING  05-03-2002  . BUNIONECTOMY WITH HAMMERTOE RECONSTRUCTION Right 03/01/2013   Procedure: RIGHT FOOT EXCISION BUNIONETTE AND FUSION OF DIP FOURTH TOE;  Surgeon: [name], MD;  Location: [facility];  Service: Orthopedics;  Laterality: Right;  . CARDIAC CATHETERIZATION  02-03-2009  DR  [name]   SINGLE-VESSEL OBSTRUCTIVE CAD, SMALL DIAGONAL BRANCH/ NORMAL LVF/ SEVERE MITRAL INSUFFICIENCY/ SEVERE PULMONARY HYPERTENSION  . CATARACT EXTRACTION W/ INTRAOCULAR LENS  IMPLANT, BILATERAL  2003  . HAMMER TOE SURGERY   02/28/2012   Procedure: HAMMER TOE CORRECTION;  Surgeon: [name], MD;  Location: [facility];  Service: Orthopedics;  Laterality: Right;  FUSION OF PROXIMAL INTERPHALANGEAL  RIGHT THIRD CLAW TOE  . PARS PLANA VITRECTOMY W/ REPAIR OF MACULAR HOLE  02-04-2002   LEFT EYE  . REPAIR RIGHT SECOND CLAW TOE/ VARUS MEDIAL CLOSING WEDGE OSTEOTOMY PROXIMAL PHALANX RIGHT GREAT TOE  05-05-2005  . TOTAL HIP ARTHROPLASTY     BILATERAL---  LEFT 4/98;   RIGHT  11/98  . VARUS OSTEOTOMY PROXIMAL PHALANX, LEFT GREAT TOE/ PARING DOWN THE CALLUS , SECOND LEFT TOE  10-27-2008    History  Smoking Status  . Never Smoker  Smokeless Tobacco  . Never Used    History  Alcohol Use  . Yes    Comment: occasional    Family History  Problem Relation Age of Onset  . Heart disease Mother 66  . Prostate cancer Father 8    Review of Systems:  As noted in history of present illness.  All other systems were reviewed and are negative.  Physical Exam: BP 140/62 (BP Location: Left Arm, Patient Position: Sitting, Cuff Size: Normal)   Pulse 82   Ht 5\' 5"  (1.651 m)   Wt 160 lb 3.2 oz (72.7 kg)   BMI 26.66 kg/m  She is a pleasant white female in no acute distress. Her HEENT exam is unremarkable. Sclera are clear. Oropharynx is clear. Neck is supple without JVD, adenopathy, thyromegaly, or bruits. Lungs are clear. Cardiac exam reveals a regular rate and rhythm without gallop. There is a grade 2/6 systolic murmur heard best at the left sternal border. Abdomen is soft and nontender. She has no significant edema. DP and PT pulses are palpable. She does have fairly extensive venous varicosities with some dependent bluish discoloration of her toes.  She is alert and oriented x3. Cranial nerves II through XII are intact.  LABORATORY DATA: INR today is pending  Lab Results  Component Value Date   WBC 3.8 (L) 01/26/2012   HGB 12.2 03/01/2013   HCT 36.0 03/01/2013   PLT 225.0 01/26/2012   GLUCOSE  108 (H) 08/25/2016   CHOL 157 01/26/2012   TRIG 91.0 01/26/2012   HDL 70.10 01/26/2012   LDLCALC 69 01/26/2012   ALT 15 01/26/2012   AST 22 01/26/2012   NA 141 08/25/2016   K 4.8 08/25/2016   CL 103 08/25/2016   CREATININE 1.09 (H) 08/25/2016   BUN 18 08/25/2016   CO2 24 08/25/2016   TSH 1.170 03/26/2014   INR 3.1 08/25/2016   HGBA1C 5.9 (H) 03/26/2014     Labs reviewed from 08/03/16: cholesterol 151, triglycerides 162, LDL 69, HDL 50.  A1c 6.1%. CMET normal.  Assessment / Plan: 1. Atrial fibrillation, permanent. Rate is well controlled on diltiazem. She is anticoagulated on Coumadin. Check INR today.  2. Mild to Moderate MR. Last Echo 8/16.   3. Hypertension with hypertensive heart disease, well controlled.  4. Hyperlipidemia on statin therapy. Good control.  5. Pulmonary hypertension-moderate  6. Coronary disease. Cardiac catheterization in 2010 showed severe stenosis in a small diagonal branch. She otherwise had no significant disease. She is asymptomatic.   7. DOE. I think this is predominantly related to pulmonary with chronic bronchitis and COPD. Currently well controlled.   Follow up in 6 months. Will repeat BMET in 3 months.

## 2016-09-26 ENCOUNTER — Ambulatory Visit (INDEPENDENT_AMBULATORY_CARE_PROVIDER_SITE_OTHER): Payer: Medicare Other | Admitting: Pharmacist Clinician (PhC)/ Clinical Pharmacy Specialist

## 2016-09-26 ENCOUNTER — Ambulatory Visit (INDEPENDENT_AMBULATORY_CARE_PROVIDER_SITE_OTHER): Payer: Medicare Other | Admitting: Cardiology

## 2016-09-26 ENCOUNTER — Encounter: Payer: Self-pay | Admitting: Cardiology

## 2016-09-26 VITALS — BP 140/62 | HR 82 | Ht 65.0 in | Wt 160.2 lb

## 2016-09-26 DIAGNOSIS — Z7901 Long term (current) use of anticoagulants: Secondary | ICD-10-CM

## 2016-09-26 DIAGNOSIS — I272 Pulmonary hypertension, unspecified: Secondary | ICD-10-CM

## 2016-09-26 DIAGNOSIS — I482 Chronic atrial fibrillation, unspecified: Secondary | ICD-10-CM

## 2016-09-26 DIAGNOSIS — I340 Nonrheumatic mitral (valve) insufficiency: Secondary | ICD-10-CM

## 2016-09-26 DIAGNOSIS — I4891 Unspecified atrial fibrillation: Secondary | ICD-10-CM

## 2016-09-26 DIAGNOSIS — I10 Essential (primary) hypertension: Secondary | ICD-10-CM | POA: Diagnosis not present

## 2016-09-26 DIAGNOSIS — E7800 Pure hypercholesterolemia, unspecified: Secondary | ICD-10-CM

## 2016-09-26 DIAGNOSIS — Z5181 Encounter for therapeutic drug level monitoring: Secondary | ICD-10-CM

## 2016-09-26 LAB — POCT INR: INR: 3

## 2016-09-26 NOTE — Patient Instructions (Signed)
Continue your current therapy  I will see you in 6 months.   

## 2016-09-27 ENCOUNTER — Telehealth: Payer: Self-pay | Admitting: Cardiology

## 2016-09-27 NOTE — Telephone Encounter (Signed)
Follow up ° ° °Pt is returning call for rn  °

## 2016-09-27 NOTE — Telephone Encounter (Signed)
New message   Pt verbalized that she wishes to talk to the rn and not disclose any information

## 2016-09-27 NOTE — Telephone Encounter (Signed)
Pt refuses to give me any further information and insists that "Dr Morrison Old nurse knows what this is about and I want to speak with her."

## 2016-09-27 NOTE — Telephone Encounter (Signed)
Returned call to patient no answer.LMTC. 

## 2016-09-27 NOTE — Telephone Encounter (Signed)
Returned call to patient.She stated she scheduled appointment with Dr.Jordan in 3 months.Advised she don't need to see Dr.Jordan in 3 months.Advised she needs bmet in 3 months and follow up with Dr.Jordan in 6 months.

## 2016-10-06 DIAGNOSIS — L853 Xerosis cutis: Secondary | ICD-10-CM | POA: Diagnosis not present

## 2016-10-06 DIAGNOSIS — L300 Nummular dermatitis: Secondary | ICD-10-CM | POA: Diagnosis not present

## 2016-10-20 ENCOUNTER — Telehealth: Payer: Self-pay | Admitting: Pharmacist Clinician (PhC)/ Clinical Pharmacy Specialist

## 2016-10-20 NOTE — Telephone Encounter (Signed)
Pt wants to know that when the weather is bad can she have her coumadin drawn at [facility] in [place]?

## 2016-10-20 NOTE — Telephone Encounter (Signed)
Assured patient that should the weather turn bad, we will make arrangements for INR draw closer to home

## 2016-10-27 ENCOUNTER — Ambulatory Visit (INDEPENDENT_AMBULATORY_CARE_PROVIDER_SITE_OTHER): Payer: Medicare Other | Admitting: Pharmacist Clinician (PhC)/ Clinical Pharmacy Specialist

## 2016-10-27 DIAGNOSIS — I482 Chronic atrial fibrillation, unspecified: Secondary | ICD-10-CM

## 2016-10-27 DIAGNOSIS — Z5181 Encounter for therapeutic drug level monitoring: Secondary | ICD-10-CM | POA: Diagnosis not present

## 2016-10-27 DIAGNOSIS — I4891 Unspecified atrial fibrillation: Secondary | ICD-10-CM

## 2016-10-27 DIAGNOSIS — Z7901 Long term (current) use of anticoagulants: Secondary | ICD-10-CM | POA: Diagnosis not present

## 2016-10-27 LAB — POCT INR: INR: 1.4

## 2016-11-04 ENCOUNTER — Other Ambulatory Visit: Payer: Self-pay | Admitting: Cardiology

## 2016-11-04 NOTE — Telephone Encounter (Signed)
°*  STAT* If patient is at the pharmacy, call can be transferred to refill team.   1. Which medications need to be refilled? (please list name of each medication and dose if known) Warfarin  2. Which pharmacy/location (including street and city if local pharmacy) is medication to [facility] sent to?[phone] 3. Do they need a 30 day or 90 day supply? 30 and refills

## 2016-11-04 NOTE — Telephone Encounter (Signed)
New Message      *STAT* If patient is at the pharmacy, call can be transferred to refill team.   1. Which medications need to be refilled? (please list name of each medication and dose if known)   warfarin (COUMADIN) 5 MG tablet     2. Which pharmacy/location (including street and city if local pharmacy) is medication to be sent to? [facility] [address] [facility] [place]  3. Do they need a 30 day or 90 day supply? 30 day

## 2016-11-09 ENCOUNTER — Telehealth: Payer: Self-pay | Admitting: Cardiology

## 2016-11-09 NOTE — Telephone Encounter (Signed)
Ms. Simuel is requesting someone call her. Please call, thanks.

## 2016-11-09 NOTE — Telephone Encounter (Signed)
Returned call to patient. She wants to speak with [facility] routed

## 2016-11-10 NOTE — Telephone Encounter (Signed)
Returned call to patient 11/10/15, she stated she is still sob.Stated she has not been using albuterol inhaler correctly.Stated her daughter showed her how to use.She will call back if Albuterol does not help.

## 2016-11-18 ENCOUNTER — Other Ambulatory Visit: Payer: Self-pay | Admitting: Cardiology

## 2016-11-30 ENCOUNTER — Ambulatory Visit (INDEPENDENT_AMBULATORY_CARE_PROVIDER_SITE_OTHER): Payer: Medicare Other | Admitting: Pharmacist

## 2016-11-30 DIAGNOSIS — Z7901 Long term (current) use of anticoagulants: Secondary | ICD-10-CM | POA: Diagnosis not present

## 2016-11-30 DIAGNOSIS — I482 Chronic atrial fibrillation, unspecified: Secondary | ICD-10-CM

## 2016-11-30 DIAGNOSIS — H5711 Ocular pain, right eye: Secondary | ICD-10-CM | POA: Diagnosis not present

## 2016-11-30 DIAGNOSIS — I4891 Unspecified atrial fibrillation: Secondary | ICD-10-CM | POA: Diagnosis not present

## 2016-11-30 DIAGNOSIS — Z5181 Encounter for therapeutic drug level monitoring: Secondary | ICD-10-CM | POA: Diagnosis not present

## 2016-11-30 DIAGNOSIS — H04123 Dry eye syndrome of bilateral lacrimal glands: Secondary | ICD-10-CM | POA: Diagnosis not present

## 2016-11-30 LAB — POCT INR: INR: 2.2

## 2016-12-01 ENCOUNTER — Other Ambulatory Visit: Payer: Self-pay | Admitting: Obstetrics and Gynecology

## 2016-12-01 DIAGNOSIS — E2839 Other primary ovarian failure: Secondary | ICD-10-CM

## 2016-12-29 ENCOUNTER — Ambulatory Visit (INDEPENDENT_AMBULATORY_CARE_PROVIDER_SITE_OTHER): Payer: Medicare Other | Admitting: Pharmacist

## 2016-12-29 ENCOUNTER — Ambulatory Visit: Payer: Medicare Other | Admitting: Cardiology

## 2016-12-29 DIAGNOSIS — M79605 Pain in left leg: Secondary | ICD-10-CM | POA: Diagnosis not present

## 2016-12-29 DIAGNOSIS — Z5181 Encounter for therapeutic drug level monitoring: Secondary | ICD-10-CM

## 2016-12-29 DIAGNOSIS — I482 Chronic atrial fibrillation, unspecified: Secondary | ICD-10-CM

## 2016-12-29 DIAGNOSIS — G894 Chronic pain syndrome: Secondary | ICD-10-CM | POA: Diagnosis not present

## 2016-12-29 DIAGNOSIS — Z7901 Long term (current) use of anticoagulants: Secondary | ICD-10-CM | POA: Diagnosis not present

## 2016-12-29 DIAGNOSIS — I4891 Unspecified atrial fibrillation: Secondary | ICD-10-CM | POA: Diagnosis not present

## 2016-12-29 DIAGNOSIS — M5136 Other intervertebral disc degeneration, lumbar region: Secondary | ICD-10-CM | POA: Diagnosis not present

## 2016-12-29 DIAGNOSIS — Z79891 Long term (current) use of opiate analgesic: Secondary | ICD-10-CM | POA: Diagnosis not present

## 2016-12-29 LAB — POCT INR: INR: 1.9

## 2017-01-26 ENCOUNTER — Ambulatory Visit (INDEPENDENT_AMBULATORY_CARE_PROVIDER_SITE_OTHER): Payer: Medicare Other | Admitting: Pharmacist Clinician (PhC)/ Clinical Pharmacy Specialist

## 2017-01-26 ENCOUNTER — Ambulatory Visit
Admission: RE | Admit: 2017-01-26 | Discharge: 2017-01-26 | Disposition: A | Discharge status: Home / Self Care | Payer: Medicare Other | Source: Ambulatory Visit | Attending: Obstetrics and Gynecology | Admitting: Obstetrics and Gynecology

## 2017-01-26 DIAGNOSIS — I482 Chronic atrial fibrillation, unspecified: Secondary | ICD-10-CM

## 2017-01-26 DIAGNOSIS — I4891 Unspecified atrial fibrillation: Secondary | ICD-10-CM | POA: Diagnosis not present

## 2017-01-26 DIAGNOSIS — E2839 Other primary ovarian failure: Secondary | ICD-10-CM

## 2017-01-26 DIAGNOSIS — Z7901 Long term (current) use of anticoagulants: Secondary | ICD-10-CM | POA: Diagnosis not present

## 2017-01-26 DIAGNOSIS — Z5181 Encounter for therapeutic drug level monitoring: Secondary | ICD-10-CM

## 2017-01-26 DIAGNOSIS — M85832 Other specified disorders of bone density and structure, left forearm: Secondary | ICD-10-CM | POA: Diagnosis not present

## 2017-01-26 LAB — POCT INR: INR: 3.1

## 2017-02-23 ENCOUNTER — Ambulatory Visit (INDEPENDENT_AMBULATORY_CARE_PROVIDER_SITE_OTHER): Payer: Medicare Other | Admitting: Pharmacist Clinician (PhC)/ Clinical Pharmacy Specialist

## 2017-02-23 DIAGNOSIS — I482 Chronic atrial fibrillation, unspecified: Secondary | ICD-10-CM

## 2017-02-23 DIAGNOSIS — Z7901 Long term (current) use of anticoagulants: Secondary | ICD-10-CM

## 2017-02-23 DIAGNOSIS — I119 Hypertensive heart disease without heart failure: Secondary | ICD-10-CM | POA: Diagnosis not present

## 2017-02-23 DIAGNOSIS — E559 Vitamin D deficiency, unspecified: Secondary | ICD-10-CM | POA: Diagnosis not present

## 2017-02-23 DIAGNOSIS — Z5181 Encounter for therapeutic drug level monitoring: Secondary | ICD-10-CM

## 2017-02-23 DIAGNOSIS — E784 Other hyperlipidemia: Secondary | ICD-10-CM | POA: Diagnosis not present

## 2017-02-23 DIAGNOSIS — I4891 Unspecified atrial fibrillation: Secondary | ICD-10-CM | POA: Diagnosis not present

## 2017-02-23 DIAGNOSIS — Z79899 Other long term (current) drug therapy: Secondary | ICD-10-CM | POA: Diagnosis not present

## 2017-02-23 DIAGNOSIS — Z0000 Encounter for general adult medical examination without abnormal findings: Secondary | ICD-10-CM | POA: Diagnosis not present

## 2017-02-23 DIAGNOSIS — M545 Low back pain: Secondary | ICD-10-CM | POA: Diagnosis not present

## 2017-02-23 LAB — POCT INR: INR: 2.9

## 2017-03-01 DIAGNOSIS — Z471 Aftercare following joint replacement surgery: Secondary | ICD-10-CM | POA: Diagnosis not present

## 2017-03-01 DIAGNOSIS — Z96641 Presence of right artificial hip joint: Secondary | ICD-10-CM | POA: Diagnosis not present

## 2017-03-01 DIAGNOSIS — M7061 Trochanteric bursitis, right hip: Secondary | ICD-10-CM | POA: Diagnosis not present

## 2017-03-01 DIAGNOSIS — M7062 Trochanteric bursitis, left hip: Secondary | ICD-10-CM | POA: Diagnosis not present

## 2017-03-02 ENCOUNTER — Other Ambulatory Visit (HOSPITAL_COMMUNITY): Payer: Self-pay | Admitting: Orthopedic Surgery

## 2017-03-02 DIAGNOSIS — M7062 Trochanteric bursitis, left hip: Principal | ICD-10-CM

## 2017-03-02 DIAGNOSIS — M7061 Trochanteric bursitis, right hip: Secondary | ICD-10-CM

## 2017-03-08 DIAGNOSIS — H40013 Open angle with borderline findings, low risk, bilateral: Secondary | ICD-10-CM | POA: Diagnosis not present

## 2017-03-08 DIAGNOSIS — H04123 Dry eye syndrome of bilateral lacrimal glands: Secondary | ICD-10-CM | POA: Diagnosis not present

## 2017-03-08 DIAGNOSIS — H01003 Unspecified blepharitis right eye, unspecified eyelid: Secondary | ICD-10-CM | POA: Diagnosis not present

## 2017-03-08 DIAGNOSIS — H5711 Ocular pain, right eye: Secondary | ICD-10-CM | POA: Diagnosis not present

## 2017-03-12 ENCOUNTER — Other Ambulatory Visit: Payer: Self-pay | Admitting: Cardiology

## 2017-03-14 ENCOUNTER — Encounter (HOSPITAL_COMMUNITY)
Admission: RE | Admit: 2017-03-14 | Discharge: 2017-03-14 | Disposition: A | Discharge status: Home / Self Care | Payer: Medicare Other | Source: Ambulatory Visit | Attending: Orthopedic Surgery | Admitting: Orthopedic Surgery

## 2017-03-14 DIAGNOSIS — M25551 Pain in right hip: Secondary | ICD-10-CM | POA: Diagnosis not present

## 2017-03-14 DIAGNOSIS — M7062 Trochanteric bursitis, left hip: Secondary | ICD-10-CM | POA: Insufficient documentation

## 2017-03-14 DIAGNOSIS — M7061 Trochanteric bursitis, right hip: Secondary | ICD-10-CM

## 2017-03-14 MED ORDER — TECHNETIUM TC 99M MEDRONATE IV KIT
20.0000 | PACK | Freq: Once | INTRAVENOUS | Status: AC | PRN
  Administered 2017-03-14: 20 via INTRAVENOUS

## 2017-03-20 ENCOUNTER — Other Ambulatory Visit: Payer: Self-pay | Admitting: Cardiology

## 2017-03-23 DIAGNOSIS — M5136 Other intervertebral disc degeneration, lumbar region: Secondary | ICD-10-CM | POA: Diagnosis not present

## 2017-03-23 DIAGNOSIS — G894 Chronic pain syndrome: Secondary | ICD-10-CM | POA: Diagnosis not present

## 2017-03-23 DIAGNOSIS — Z79891 Long term (current) use of opiate analgesic: Secondary | ICD-10-CM | POA: Diagnosis not present

## 2017-03-25 NOTE — Progress Notes (Signed)
[name] Date of Birth: Nov 17, 1931   History of Present Illness: [name] is seen for follow up Afib. She has a history of chronic atrial fibrillation, hypertensive heart disease, and pulmonary hypertension. She has a history of chronic back problems. She has moderate MR and COPD. In October she developed progressive weakness in her legs. We held her lipitor without improvement. MRI was done. This showed some chronic fatty atrophy of the muscles c/w denervation in the peroneal nerve distribution.  She has a number of complaints today. Her breathing has not been as good recently.  This is worse when the pollen is high or when the weather is hot. No chest pain. No cough. No edema. She has had some diarrhea recently. Is scheduled to see Dr. Collene Mares. Balance is poor with her leg weakness and she uses a cane now. She is interesting in switching to a DOAC.    Current Outpatient Prescriptions on File Prior to Visit  Medication Sig Dispense Refill  . albuterol (PROVENTIL HFA;VENTOLIN HFA) 108 (90 Base) MCG/ACT inhaler Inhale 2 puffs into the lungs every 6 (six) hours as needed for wheezing or shortness of breath. 1 Inhaler 2  . Ascorbic Acid (VITAMIN C) 1000 MG tablet Take 1,000 mg by mouth daily.      Marland Kitchen atorvastatin (LIPITOR) 20 MG tablet Take 1 tablet (20 mg total) by mouth daily. 90 tablet 3  . Calcium Carbonate-Vitamin D (CALCIUM + D PO) Take by mouth daily.      . Cholecalciferol (VITAMIN D PO) Take by mouth daily.      . Cyanocobalamin (VITAMIN B-12 PO) Take by mouth as directed.    . diltiazem (CARDIZEM CD) 120 MG 24 hr capsule Take 1 capsule (120 mg total) by mouth daily. 30 capsule 10  . estrogens, conjugated, (PREMARIN) 1.25 MG tablet Take 1.25 mg by mouth daily.      . furosemide (LASIX) 40 MG tablet Take 1/2 tablet daily ( 20 mg ) 30 tablet 6  . gabapentin (NEURONTIN) 300 MG capsule Take 1 capsule by mouth daily.    Marland Kitchen HYDROcodone-acetaminophen (NORCO) 10-325 MG per tablet  Take 1 tablet by mouth every 6 (six) hours as needed.    Marland Kitchen KLOR-CON M20 20 MEQ tablet TAKE 1 TABLET (20 MEQ TOTAL) BY MOUTH 2 (TWO) TIMES DAILY. 60 tablet 0  . lisinopril (PRINIVIL,ZESTRIL) 20 MG tablet Take 1 tablet (20 mg total) by mouth daily. 30 tablet 6  . Multiple Vitamins-Minerals (ZINC PO) Take by mouth daily.      . sertraline (ZOLOFT) 50 MG tablet Take 1 tablet by mouth daily.  3  . valACYclovir (VALTREX) 500 MG tablet     . vitamin A 8000 UNIT capsule Take 8,000 Units by mouth daily.      Marland Kitchen warfarin (COUMADIN) 5 MG tablet TAKE 1 TABLET BY MOUTH EVERY DAY OR AS DIRECTED BY THE COUMADIN CLINIC 60 tablet 2  . [DISCONTINUED] diltiazem (CARDIZEM) 120 MG tablet Take 1 tablet (120 mg total) by mouth daily. 30 tablet 5   No current facility-administered medications on file prior to visit.     Allergies  Allergen Reactions  . Codeine Nausea And Vomiting  . Penicillins   . Sulfonamide Derivatives Rash    Past Medical History:  Diagnosis Date  . Anemia   . Anticoagulated on Coumadin    CHRONIC  . Arthritis   . Back pain, chronic   . Chronic atrial fibrillation ([place])   . Coronary artery disease CARDIOLOGIST-  DR [name]--  LOV IN EPIC  . Depression   . Hammer toe   . Hypercholesterolemia   . Hypertension   . Hypertensive heart disease    WITH LVH  . LVH (left ventricular hypertrophy) due to hypertensive disease   . Mitral insufficiency    CHRONIC  . Pulmonary hypertension ([place])   . Transfusion history     Past Surgical History:  Procedure Laterality Date  . ABDOMINAL HYSTERECTOMY    . ANTERIOR REPAIR/ [place] PUBOVAGINAL SLING  05-03-2002  . BUNIONECTOMY WITH HAMMERTOE RECONSTRUCTION Right 03/01/2013   Procedure: RIGHT FOOT EXCISION BUNIONETTE AND FUSION OF DIP FOURTH TOE;  Surgeon: [name], MD;  Location: [facility];  Service: Orthopedics;  Laterality: Right;  . CARDIAC CATHETERIZATION  02-03-2009  DR  [name]   SINGLE-VESSEL OBSTRUCTIVE CAD, SMALL  DIAGONAL BRANCH/ NORMAL LVF/ SEVERE MITRAL INSUFFICIENCY/ SEVERE PULMONARY HYPERTENSION  . CATARACT EXTRACTION W/ INTRAOCULAR LENS  IMPLANT, BILATERAL  2003  . HAMMER TOE SURGERY  02/28/2012   Procedure: HAMMER TOE CORRECTION;  Surgeon: [name], MD;  Location: [facility];  Service: Orthopedics;  Laterality: Right;  FUSION OF PROXIMAL INTERPHALANGEAL  RIGHT THIRD CLAW TOE  . PARS PLANA VITRECTOMY W/ REPAIR OF MACULAR HOLE  02-04-2002   LEFT EYE  . REPAIR RIGHT SECOND CLAW TOE/ VARUS MEDIAL CLOSING WEDGE OSTEOTOMY PROXIMAL PHALANX RIGHT GREAT TOE  05-05-2005  . TOTAL HIP ARTHROPLASTY     BILATERAL---  LEFT 4/98;   RIGHT  11/98  . VARUS OSTEOTOMY PROXIMAL PHALANX, LEFT GREAT TOE/ PARING DOWN THE CALLUS , SECOND LEFT TOE  10-27-2008    History  Smoking Status  . Never Smoker  Smokeless Tobacco  . Never Used    History  Alcohol Use  . Yes    Comment: occasional    Family History  Problem Relation Age of Onset  . Heart disease Mother 70  . Prostate cancer Father 39    Review of Systems:  As noted in history of present illness.  All other systems were reviewed and are negative.  Physical Exam: BP 132/86   Pulse 96   Ht 5\' 5"  (1.651 m)   Wt 162 lb (73.5 kg)   SpO2 98%   BMI 26.96 kg/m  She is a pleasant white female in no acute distress. Her HEENT exam is unremarkable.  Neck is supple without JVD, adenopathy, thyromegaly, or bruits. Lungs are clear. Cardiac exam reveals an irregular rate and rhythm without gallop. There is a grade 2/6 systolic murmur heard best at the left sternal border. Abdomen is soft and nontender. She has no significant edema. DP and PT pulses are palpable. She does have fairly extensive venous varicosities with some dependent bluish discoloration of her toes.  She is alert and oriented x3. Cranial nerves II through XII are intact.  LABORATORY DATA: INR today is pending  Lab Results  Component Value Date   WBC 3.8 (L) 01/26/2012    HGB 12.2 03/01/2013   HCT 36.0 03/01/2013   PLT 225.0 01/26/2012   GLUCOSE 108 (H) 08/25/2016   CHOL 157 01/26/2012   TRIG 91.0 01/26/2012   HDL 70.10 01/26/2012   LDLCALC 69 01/26/2012   ALT 15 01/26/2012   AST 22 01/26/2012   NA 141 08/25/2016   K 4.8 08/25/2016   CL 103 08/25/2016   CREATININE 1.09 (H) 08/25/2016   BUN 18 08/25/2016   CO2 24 08/25/2016   TSH 1.170 03/26/2014   INR 2.9  02/23/2017   HGBA1C 5.9 (H) 03/26/2014    Labs reviewed from 02/24/17: cholesterol 174, triglycerides 110, LDL 90, HDL 62. A1c 6.0%. CMET and TSH normal.  Ecg today shows atrial fibrillation with rate 84. Nonspecific ST- T wave abnormality. Poor R wave progression. I have personally reviewed and interpreted this study.  Assessment / Plan: 1. Atrial fibrillation, permanent. Rate is well controlled on diltiazem. She is anticoagulated on Coumadin. Check INR today. Will transition to Eliquis under pharmacy guidance.   2. Mild to Moderate MR. Last Echo 8/16.   3. Hypertension with hypertensive heart disease, BP well controlled.  4. Hyperlipidemia on statin therapy. Good control.  5. Pulmonary hypertension-moderate  6. Coronary disease. Cardiac catheterization in 2010 showed severe stenosis in a small diagonal branch. She otherwise had no significant disease. She is asymptomatic.   7. Chronic bronchitis and COPD. Currently well controlled.   Follow up in 6 months.

## 2017-03-27 ENCOUNTER — Ambulatory Visit (INDEPENDENT_AMBULATORY_CARE_PROVIDER_SITE_OTHER): Payer: Medicare Other | Admitting: Cardiology

## 2017-03-27 ENCOUNTER — Encounter: Payer: Self-pay | Admitting: Cardiology

## 2017-03-27 ENCOUNTER — Ambulatory Visit (INDEPENDENT_AMBULATORY_CARE_PROVIDER_SITE_OTHER): Payer: Medicare Other | Admitting: Pharmacist

## 2017-03-27 VITALS — BP 132/86 | HR 96 | Ht 65.0 in | Wt 162.0 lb

## 2017-03-27 DIAGNOSIS — I340 Nonrheumatic mitral (valve) insufficiency: Secondary | ICD-10-CM | POA: Diagnosis not present

## 2017-03-27 DIAGNOSIS — I482 Chronic atrial fibrillation, unspecified: Secondary | ICD-10-CM

## 2017-03-27 DIAGNOSIS — I10 Essential (primary) hypertension: Secondary | ICD-10-CM

## 2017-03-27 DIAGNOSIS — E7800 Pure hypercholesterolemia, unspecified: Secondary | ICD-10-CM

## 2017-03-27 DIAGNOSIS — Z5181 Encounter for therapeutic drug level monitoring: Secondary | ICD-10-CM | POA: Diagnosis not present

## 2017-03-27 DIAGNOSIS — M8580 Other specified disorders of bone density and structure, unspecified site: Secondary | ICD-10-CM | POA: Insufficient documentation

## 2017-03-27 DIAGNOSIS — Z7901 Long term (current) use of anticoagulants: Secondary | ICD-10-CM

## 2017-03-27 DIAGNOSIS — Z780 Asymptomatic menopausal state: Secondary | ICD-10-CM | POA: Insufficient documentation

## 2017-03-27 DIAGNOSIS — I4891 Unspecified atrial fibrillation: Secondary | ICD-10-CM

## 2017-03-27 LAB — POCT INR: INR: 3.8

## 2017-03-27 NOTE — Patient Instructions (Signed)
We will transition Coumadin to Eliquis  Continue your other therapy  I will see you in 6 months.

## 2017-03-30 DIAGNOSIS — R194 Change in bowel habit: Secondary | ICD-10-CM | POA: Diagnosis not present

## 2017-03-30 DIAGNOSIS — K648 Other hemorrhoids: Secondary | ICD-10-CM | POA: Diagnosis not present

## 2017-04-04 ENCOUNTER — Telehealth: Payer: Self-pay | Admitting: Cardiology

## 2017-04-04 MED ORDER — APIXABAN 5 MG PO TABS: 5.0000 mg | ORAL_TABLET | Freq: Two times a day (BID) | ORAL | 6 refills | Status: DC

## 2017-04-04 NOTE — Telephone Encounter (Signed)
Returned call to patient.She stated she needed Eliquis refill.Refill sent to pharmacy.

## 2017-04-04 NOTE — Telephone Encounter (Signed)
New Message  Pt voiced wanting to speak with Malachy Mood and for her to give her a call back.  Please f/u

## 2017-04-05 ENCOUNTER — Telehealth: Payer: Self-pay | Admitting: Cardiology

## 2017-04-05 NOTE — Telephone Encounter (Signed)
New message    Patient would like to speak with you about her new prescription  pixaban (ELIQUIS) 5 MG TABS tablet Take 1 tablet (5 mg total) by mouth 2 (two) times daily

## 2017-04-05 NOTE — Telephone Encounter (Signed)
Returned call to patient.She stated Eliquis will be costing $45 every month.Stated she don't know if she will be able to afford.Stated she will call me back if she decides to switch back to Warfarin.

## 2017-04-07 ENCOUNTER — Telehealth: Payer: Self-pay | Admitting: Cardiology

## 2017-04-07 DIAGNOSIS — I10 Essential (primary) hypertension: Secondary | ICD-10-CM

## 2017-04-07 DIAGNOSIS — I340 Nonrheumatic mitral (valve) insufficiency: Secondary | ICD-10-CM

## 2017-04-07 NOTE — Telephone Encounter (Signed)
Returned call to patient.Shingrix vaccine phoned in to pharmacy.

## 2017-04-07 NOTE — Telephone Encounter (Signed)
Spoke with pt she states that she only wants to talk to [name]

## 2017-04-07 NOTE — Telephone Encounter (Signed)
New message     Pt needs a prescription for the shingles injection

## 2017-04-10 ENCOUNTER — Other Ambulatory Visit: Payer: Self-pay | Admitting: Cardiology

## 2017-04-26 DIAGNOSIS — Z79890 Hormone replacement therapy (postmenopausal): Secondary | ICD-10-CM | POA: Diagnosis not present

## 2017-04-26 DIAGNOSIS — Z01419 Encounter for gynecological examination (general) (routine) without abnormal findings: Secondary | ICD-10-CM | POA: Diagnosis not present

## 2017-04-26 DIAGNOSIS — Z1389 Encounter for screening for other disorder: Secondary | ICD-10-CM | POA: Diagnosis not present

## 2017-05-02 DIAGNOSIS — K5730 Diverticulosis of large intestine without perforation or abscess without bleeding: Secondary | ICD-10-CM | POA: Diagnosis not present

## 2017-05-02 DIAGNOSIS — R194 Change in bowel habit: Secondary | ICD-10-CM | POA: Diagnosis not present

## 2017-05-04 ENCOUNTER — Telehealth: Payer: Self-pay | Admitting: Cardiology

## 2017-05-04 NOTE — Telephone Encounter (Signed)
New message   Pt verbalized that she is calling for rn   She said she is returning call

## 2017-05-04 NOTE — Telephone Encounter (Signed)
Called patient. She refused to speak to me and wanted to speak to [place] only. She would not indicate reason for call. Aware that Malachy Mood is out of office today, pt voiced that is fine, she will speak to her when she is back in office.

## 2017-05-05 ENCOUNTER — Telehealth: Payer: Self-pay | Admitting: Pharmacist

## 2017-05-05 ENCOUNTER — Other Ambulatory Visit: Payer: Self-pay | Admitting: Pharmacist

## 2017-05-05 MED ORDER — WARFARIN SODIUM 5 MG PO TABS: ORAL_TABLET | ORAL | 3 refills | Status: DC

## 2017-05-05 NOTE — Telephone Encounter (Signed)
Patient to see Dr Nelva Bush on 05/24/17. No beed to stop anticoagulation per Dr Nelva Bush instructions. INR f/u same day.

## 2017-05-05 NOTE — Telephone Encounter (Signed)
Patient has enough supply of Eliquis until 05/13/2017. Will resume warfarin at previously stable dose of 2mg  daily except 5mg  on Mondays and Fridays.  Repeat INR 10 days after initiating warfarin 05/24/17.

## 2017-05-05 NOTE — Telephone Encounter (Signed)
°  Follow Up  Pt requesting to speak to a nurse regarding switching back to Coumadin from Eliquis due to cost. Pt is aware Malachy Mood is out of the office and another nurse would be calling her back to speak to her.

## 2017-05-07 NOTE — Telephone Encounter (Signed)
OK to switch back due to cost.  Caroll Weinheimer [name] MD, [facility]

## 2017-05-12 NOTE — Telephone Encounter (Signed)
Spoke to patient she stopped eliquis and went back on coumadin.She has INR appointment 05/24/17.

## 2017-05-15 ENCOUNTER — Telehealth: Payer: Self-pay | Admitting: Cardiology

## 2017-05-15 NOTE — Telephone Encounter (Signed)
New message   Pt has questions about some over the counter medications and only wants to speak to [place]. Requests call back.

## 2017-05-15 NOTE — Telephone Encounter (Signed)
Returned call to patient.She wanted to know if she can take a new aleve for back pain.Advised she should only take tylenol as needed.Advised to avoid taking all  NSAIDs.

## 2017-05-24 ENCOUNTER — Ambulatory Visit (INDEPENDENT_AMBULATORY_CARE_PROVIDER_SITE_OTHER): Payer: Medicare Other | Admitting: Pharmacist Clinician (PhC)/ Clinical Pharmacy Specialist

## 2017-05-24 DIAGNOSIS — Z5181 Encounter for therapeutic drug level monitoring: Secondary | ICD-10-CM

## 2017-05-24 DIAGNOSIS — I4891 Unspecified atrial fibrillation: Secondary | ICD-10-CM | POA: Diagnosis not present

## 2017-05-24 DIAGNOSIS — M5136 Other intervertebral disc degeneration, lumbar region: Secondary | ICD-10-CM | POA: Diagnosis not present

## 2017-05-24 DIAGNOSIS — Z7901 Long term (current) use of anticoagulants: Secondary | ICD-10-CM

## 2017-05-24 DIAGNOSIS — I482 Chronic atrial fibrillation, unspecified: Secondary | ICD-10-CM

## 2017-05-24 LAB — POCT INR: INR: 3.1

## 2017-05-30 DIAGNOSIS — J0100 Acute maxillary sinusitis, unspecified: Secondary | ICD-10-CM | POA: Diagnosis not present

## 2017-06-27 ENCOUNTER — Ambulatory Visit (INDEPENDENT_AMBULATORY_CARE_PROVIDER_SITE_OTHER): Payer: Medicare Other | Admitting: Pharmacist

## 2017-06-27 DIAGNOSIS — Z7901 Long term (current) use of anticoagulants: Secondary | ICD-10-CM | POA: Diagnosis not present

## 2017-06-27 LAB — POCT INR: INR: 4.3

## 2017-07-26 ENCOUNTER — Ambulatory Visit (INDEPENDENT_AMBULATORY_CARE_PROVIDER_SITE_OTHER): Payer: Medicare Other | Admitting: Pharmacist

## 2017-07-26 DIAGNOSIS — I4891 Unspecified atrial fibrillation: Secondary | ICD-10-CM | POA: Diagnosis not present

## 2017-07-26 DIAGNOSIS — Z5181 Encounter for therapeutic drug level monitoring: Secondary | ICD-10-CM

## 2017-07-26 DIAGNOSIS — Z79891 Long term (current) use of opiate analgesic: Secondary | ICD-10-CM | POA: Diagnosis not present

## 2017-07-26 DIAGNOSIS — Z7901 Long term (current) use of anticoagulants: Secondary | ICD-10-CM | POA: Diagnosis not present

## 2017-07-26 DIAGNOSIS — G894 Chronic pain syndrome: Secondary | ICD-10-CM | POA: Diagnosis not present

## 2017-07-26 DIAGNOSIS — M5136 Other intervertebral disc degeneration, lumbar region: Secondary | ICD-10-CM | POA: Diagnosis not present

## 2017-07-26 DIAGNOSIS — I482 Chronic atrial fibrillation, unspecified: Secondary | ICD-10-CM

## 2017-07-26 LAB — POCT INR: INR: 3.8

## 2017-07-31 ENCOUNTER — Other Ambulatory Visit: Payer: Self-pay | Admitting: Cardiology

## 2017-08-24 ENCOUNTER — Ambulatory Visit (INDEPENDENT_AMBULATORY_CARE_PROVIDER_SITE_OTHER): Payer: Medicare Other | Admitting: Pharmacist

## 2017-08-24 DIAGNOSIS — Z7901 Long term (current) use of anticoagulants: Secondary | ICD-10-CM

## 2017-08-24 DIAGNOSIS — Z5181 Encounter for therapeutic drug level monitoring: Secondary | ICD-10-CM | POA: Diagnosis not present

## 2017-08-24 DIAGNOSIS — Z23 Encounter for immunization: Secondary | ICD-10-CM | POA: Diagnosis not present

## 2017-08-24 DIAGNOSIS — I4891 Unspecified atrial fibrillation: Secondary | ICD-10-CM

## 2017-08-24 DIAGNOSIS — I482 Chronic atrial fibrillation, unspecified: Secondary | ICD-10-CM

## 2017-08-24 DIAGNOSIS — E785 Hyperlipidemia, unspecified: Secondary | ICD-10-CM | POA: Diagnosis not present

## 2017-08-24 DIAGNOSIS — I119 Hypertensive heart disease without heart failure: Secondary | ICD-10-CM | POA: Diagnosis not present

## 2017-08-24 DIAGNOSIS — R7309 Other abnormal glucose: Secondary | ICD-10-CM | POA: Diagnosis not present

## 2017-08-24 LAB — POCT INR: INR: 2.6

## 2017-09-15 ENCOUNTER — Encounter: Payer: Self-pay | Admitting: Cardiology

## 2017-09-21 DIAGNOSIS — H35033 Hypertensive retinopathy, bilateral: Secondary | ICD-10-CM | POA: Diagnosis not present

## 2017-09-21 DIAGNOSIS — H40013 Open angle with borderline findings, low risk, bilateral: Secondary | ICD-10-CM | POA: Diagnosis not present

## 2017-09-21 DIAGNOSIS — H5310 Unspecified subjective visual disturbances: Secondary | ICD-10-CM | POA: Diagnosis not present

## 2017-09-21 DIAGNOSIS — H353132 Nonexudative age-related macular degeneration, bilateral, intermediate dry stage: Secondary | ICD-10-CM | POA: Diagnosis not present

## 2017-09-23 NOTE — Progress Notes (Signed)
[name] Date of Birth: [birth year]   History of Present Illness: [name] is seen for follow up Afib. She has a history of chronic atrial fibrillation, hypertensive heart disease, and pulmonary hypertension. She has a history of chronic back problems. She has moderate MR and COPD. In October she developed progressive weakness in her legs. We held her lipitor without improvement. MRI was done. This showed some chronic fatty atrophy of the muscles c/w denervation in the peroneal nerve distribution. On her last visit we discussed switching to a DOAC but she could not afford it.   On follow up today she is doing fairly well. Denies any increase in SOB. No chest pain. Has some numbness in her toes. Denies any recent cough or bronchitis. No edema. She is sedentary. Walks with a cane.   Current Outpatient Medications on File Prior to Visit  Medication Sig Dispense Refill  . albuterol (PROVENTIL HFA;VENTOLIN HFA) 108 (90 Base) MCG/ACT inhaler Inhale 2 puffs into the lungs every 6 (six) hours as needed for wheezing or shortness of breath. 1 Inhaler 2  . Ascorbic Acid (VITAMIN C) 1000 MG tablet Take 1,000 mg by mouth daily.      Marland Kitchen atorvastatin (LIPITOR) 20 MG tablet Take 1 tablet (20 mg total) by mouth daily. 90 tablet 3  . Calcium Carbonate-Vitamin D (CALCIUM + D PO) Take by mouth daily.      . Cholecalciferol (VITAMIN D PO) Take by mouth daily.      . Cyanocobalamin (VITAMIN B-12 PO) Take by mouth as directed.    . diltiazem (CARDIZEM CD) 120 MG 24 hr capsule Take 1 capsule (120 mg total) by mouth daily. 30 capsule 10  . estrogens, conjugated, (PREMARIN) 1.25 MG tablet Take 1.25 mg by mouth daily.      . furosemide (LASIX) 40 MG tablet TAKE 1/2 TABLET BY MOUTH DAILY 30 tablet 2  . gabapentin (NEURONTIN) 300 MG capsule Take 1 capsule by mouth daily.    Marland Kitchen HYDROcodone-acetaminophen (NORCO) 10-325 MG per tablet Take 1 tablet by mouth every 6 (six) hours as needed.    Marland Kitchen KLOR-CON M20 20  MEQ tablet TAKE 1 TABLET (20 MEQ TOTAL) BY MOUTH 2 (TWO) TIMES DAILY. 60 tablet 11  . lisinopril (PRINIVIL,ZESTRIL) 20 MG tablet Take 1 tablet (20 mg total) by mouth daily. 30 tablet 6  . Multiple Vitamins-Minerals (ZINC PO) Take by mouth daily.      . sertraline (ZOLOFT) 50 MG tablet Take 1 tablet by mouth daily.  3  . valACYclovir (VALTREX) 500 MG tablet     . vitamin A 8000 UNIT capsule Take 8,000 Units by mouth daily.      Marland Kitchen warfarin (COUMADIN) 5 MG tablet Take 1/2 to 1 tablet daily as directed by coumadin clinic 90 tablet 3  . [DISCONTINUED] diltiazem (CARDIZEM) 120 MG tablet Take 1 tablet (120 mg total) by mouth daily. 30 tablet 5   No current facility-administered medications on file prior to visit.     Allergies  Allergen Reactions  . Codeine Nausea And Vomiting  . Penicillins   . Sulfonamide Derivatives Rash    Past Medical History:  Diagnosis Date  . Anemia   . Anticoagulated on Coumadin    CHRONIC  . Arthritis   . Back pain, chronic   . Chronic atrial fibrillation ([place])   . Coronary artery disease CARDIOLOGIST- DR [name]--  LOV IN EPIC  . Depression   . Hammer toe   . Hypercholesterolemia   .  Hypertension   . Hypertensive heart disease    WITH LVH  . LVH (left ventricular hypertrophy) due to hypertensive disease   . Mitral insufficiency    CHRONIC  . Pulmonary hypertension ([place])   . Transfusion history     Past Surgical History:  Procedure Laterality Date  . ABDOMINAL HYSTERECTOMY    . ANTERIOR REPAIR/ [place] PUBOVAGINAL SLING  05-03-2002  . CARDIAC CATHETERIZATION  02-03-2009  DR  [name]   SINGLE-VESSEL OBSTRUCTIVE CAD, SMALL DIAGONAL BRANCH/ NORMAL LVF/ SEVERE MITRAL INSUFFICIENCY/ SEVERE PULMONARY HYPERTENSION  . CATARACT EXTRACTION W/ INTRAOCULAR LENS  IMPLANT, BILATERAL  2003  . HAMMER TOE CORRECTION Right 02/28/2012   Performed by Aplington, Laurice Record, MD at [facility]  . PARS PLANA VITRECTOMY W/ REPAIR OF MACULAR HOLE  02-04-2002   LEFT  EYE  . REPAIR RIGHT SECOND CLAW TOE/ VARUS MEDIAL CLOSING WEDGE OSTEOTOMY PROXIMAL PHALANX RIGHT GREAT TOE  05-05-2005  . RIGHT FOOT EXCISION BUNIONETTE AND FUSION OF DIP FOURTH TOE Right 03/01/2013   Performed by Aplington, Laurice Record, MD at [facility]  . TOTAL HIP ARTHROPLASTY     BILATERAL---  LEFT 4/98;   RIGHT  11/98  . VARUS OSTEOTOMY PROXIMAL PHALANX, LEFT GREAT TOE/ PARING DOWN THE CALLUS , SECOND LEFT TOE  10-27-2008    Social History   Tobacco Use  Smoking Status Never Smoker  Smokeless Tobacco Never Used    Social History   Substance and Sexual Activity  Alcohol Use Yes   Comment: occasional    Family History  Problem Relation Age of Onset  . Heart disease Mother 45  . Prostate cancer Father 3    Review of Systems:  As noted in history of present illness.  All other systems were reviewed and are negative.  Physical Exam: BP 140/80   Pulse 62   Ht 5\' 5"  (1.651 m)   Wt 161 lb 12.8 oz (73.4 kg)   SpO2 97%   BMI 26.92 kg/m  GENERAL:  Well appearing HEENT:  PERRL, EOMI, sclera are clear. Oropharynx is clear. NECK:  No jugular venous distention, carotid upstroke brisk and symmetric, no bruits, no thyromegaly or adenopathy LUNGS:  Clear to auscultation bilaterally CHEST:  Unremarkable HEART:  IRRR,  PMI not displaced or sustained,S1 and S2 within normal limits, no S3, no S4: no clicks, no rubs, gr 8-8/4 systolic murmur ABD:  Soft, nontender. BS +, no masses or bruits. No hepatomegaly, no splenomegaly EXT:  2 + pulses throughout, no edema, no cyanosis no clubbing SKIN:  Warm and dry.  No rashes NEURO:  Alert and oriented x 3. Cranial nerves II through XII intact. PSYCH:  Cognitively intact    LABORATORY DATA: INR today is pending  Lab Results  Component Value Date   WBC 3.8 (L) 01/26/2012   HGB 12.2 03/01/2013   HCT 36.0 03/01/2013   PLT 225.0 01/26/2012   GLUCOSE 108 (H) 08/25/2016   CHOL 157 01/26/2012   TRIG 91.0 01/26/2012   HDL  70.10 01/26/2012   LDLCALC 69 01/26/2012   ALT 15 01/26/2012   AST 22 01/26/2012   NA 141 08/25/2016   K 4.8 08/25/2016   CL 103 08/25/2016   CREATININE 1.09 (H) 08/25/2016   BUN 18 08/25/2016   CO2 24 08/25/2016   TSH 1.170 03/26/2014   INR 2.6 08/24/2017   HGBA1C 5.9 (H) 03/26/2014    Labs reviewed from 02/24/17: cholesterol 174, triglycerides 110, LDL 90, HDL 62. A1c 6.0%. CMET  and TSH normal. Dated 08/24/17: cholesterol 157, triglycerides 106, HDL 55, LDL 90. A1c 6.1%. CMET and CBC normal   Assessment / Plan: 1. Atrial fibrillation, permanent. Rate is well controlled on diltiazem. She is anticoagulated on Coumadin. Check INR today.   2. Mild to Moderate MR. Last Echo 8/16. No change in exam.  3. Hypertension with hypertensive heart disease, BP well controlled.  4. Hyperlipidemia on statin therapy. LDL 90 on statin  5. Pulmonary hypertension-moderate  6. Coronary disease. Cardiac catheterization in 2010 showed severe stenosis in a small diagonal branch. She otherwise had no significant disease. She is asymptomatic.   7. Chronic bronchitis and COPD. Currently well controlled.   Follow up in 6 months.

## 2017-09-25 ENCOUNTER — Telehealth: Payer: Self-pay | Admitting: Cardiology

## 2017-09-25 NOTE — Telephone Encounter (Signed)
error 

## 2017-09-26 ENCOUNTER — Ambulatory Visit (INDEPENDENT_AMBULATORY_CARE_PROVIDER_SITE_OTHER): Payer: Medicare Other | Admitting: Cardiology

## 2017-09-26 ENCOUNTER — Encounter: Payer: Self-pay | Admitting: Cardiology

## 2017-09-26 ENCOUNTER — Ambulatory Visit (INDEPENDENT_AMBULATORY_CARE_PROVIDER_SITE_OTHER): Payer: Medicare Other | Admitting: Pharmacist Clinician (PhC)/ Clinical Pharmacy Specialist

## 2017-09-26 VITALS — BP 140/80 | HR 62 | Ht 65.0 in | Wt 161.8 lb

## 2017-09-26 DIAGNOSIS — I4891 Unspecified atrial fibrillation: Secondary | ICD-10-CM

## 2017-09-26 DIAGNOSIS — I10 Essential (primary) hypertension: Secondary | ICD-10-CM

## 2017-09-26 DIAGNOSIS — Z7901 Long term (current) use of anticoagulants: Secondary | ICD-10-CM

## 2017-09-26 DIAGNOSIS — I482 Chronic atrial fibrillation, unspecified: Secondary | ICD-10-CM

## 2017-09-26 DIAGNOSIS — Z5181 Encounter for therapeutic drug level monitoring: Secondary | ICD-10-CM

## 2017-09-26 DIAGNOSIS — E7800 Pure hypercholesterolemia, unspecified: Secondary | ICD-10-CM | POA: Diagnosis not present

## 2017-09-26 DIAGNOSIS — Z1231 Encounter for screening mammogram for malignant neoplasm of breast: Secondary | ICD-10-CM | POA: Diagnosis not present

## 2017-09-26 LAB — POCT INR: INR: 2.4

## 2017-09-26 NOTE — Patient Instructions (Signed)
Continue your current therapy  I will see you in 6 months.   

## 2017-10-06 DIAGNOSIS — M160 Bilateral primary osteoarthritis of hip: Secondary | ICD-10-CM | POA: Diagnosis not present

## 2017-10-06 DIAGNOSIS — M25551 Pain in right hip: Secondary | ICD-10-CM | POA: Diagnosis not present

## 2017-10-24 ENCOUNTER — Other Ambulatory Visit: Payer: Self-pay | Admitting: Cardiology

## 2017-10-25 ENCOUNTER — Ambulatory Visit (INDEPENDENT_AMBULATORY_CARE_PROVIDER_SITE_OTHER): Payer: Medicare Other | Admitting: Pharmacist

## 2017-10-25 DIAGNOSIS — Z7901 Long term (current) use of anticoagulants: Secondary | ICD-10-CM | POA: Diagnosis not present

## 2017-10-25 DIAGNOSIS — Z5181 Encounter for therapeutic drug level monitoring: Secondary | ICD-10-CM

## 2017-10-25 DIAGNOSIS — I482 Chronic atrial fibrillation, unspecified: Secondary | ICD-10-CM

## 2017-10-25 DIAGNOSIS — I4891 Unspecified atrial fibrillation: Secondary | ICD-10-CM

## 2017-10-25 LAB — POCT INR: INR: 1.3

## 2017-11-22 ENCOUNTER — Ambulatory Visit (INDEPENDENT_AMBULATORY_CARE_PROVIDER_SITE_OTHER): Payer: Medicare Other | Admitting: Pharmacist Clinician (PhC)/ Clinical Pharmacy Specialist

## 2017-11-22 DIAGNOSIS — I482 Chronic atrial fibrillation, unspecified: Secondary | ICD-10-CM

## 2017-11-22 DIAGNOSIS — Z79891 Long term (current) use of opiate analgesic: Secondary | ICD-10-CM | POA: Insufficient documentation

## 2017-11-22 DIAGNOSIS — Z5181 Encounter for therapeutic drug level monitoring: Secondary | ICD-10-CM

## 2017-11-22 DIAGNOSIS — Z7901 Long term (current) use of anticoagulants: Secondary | ICD-10-CM

## 2017-11-22 DIAGNOSIS — I4891 Unspecified atrial fibrillation: Secondary | ICD-10-CM

## 2017-11-22 DIAGNOSIS — G894 Chronic pain syndrome: Secondary | ICD-10-CM | POA: Diagnosis not present

## 2017-11-22 DIAGNOSIS — M545 Low back pain: Secondary | ICD-10-CM | POA: Diagnosis not present

## 2017-11-22 DIAGNOSIS — G8929 Other chronic pain: Secondary | ICD-10-CM | POA: Insufficient documentation

## 2017-11-22 LAB — POCT INR: INR: 1.8

## 2017-11-22 NOTE — Patient Instructions (Signed)
Description   Increase dose to 1 tablet each Monday, Wednesday and Friday, 1/2 tablet all other days.  Repeat INR in 2 weeks     

## 2017-12-07 ENCOUNTER — Ambulatory Visit (INDEPENDENT_AMBULATORY_CARE_PROVIDER_SITE_OTHER): Payer: Medicare Other | Admitting: Pharmacist Clinician (PhC)/ Clinical Pharmacy Specialist

## 2017-12-07 DIAGNOSIS — Z7901 Long term (current) use of anticoagulants: Secondary | ICD-10-CM

## 2017-12-07 DIAGNOSIS — K5730 Diverticulosis of large intestine without perforation or abscess without bleeding: Secondary | ICD-10-CM | POA: Diagnosis not present

## 2017-12-07 DIAGNOSIS — I482 Chronic atrial fibrillation, unspecified: Secondary | ICD-10-CM

## 2017-12-07 DIAGNOSIS — Z5181 Encounter for therapeutic drug level monitoring: Secondary | ICD-10-CM

## 2017-12-07 DIAGNOSIS — I4891 Unspecified atrial fibrillation: Secondary | ICD-10-CM | POA: Diagnosis not present

## 2017-12-07 DIAGNOSIS — R194 Change in bowel habit: Secondary | ICD-10-CM | POA: Diagnosis not present

## 2017-12-07 LAB — POCT INR: INR: 2.3

## 2017-12-07 NOTE — Patient Instructions (Signed)
Description   Continue with 1 tablet each Monday, Wednesday and Friday, 1/2 tablet all other days.  Repeat INR in 4 weeks

## 2018-01-03 ENCOUNTER — Ambulatory Visit (INDEPENDENT_AMBULATORY_CARE_PROVIDER_SITE_OTHER): Payer: Medicare Other | Admitting: Pharmacist Clinician (PhC)/ Clinical Pharmacy Specialist

## 2018-01-03 DIAGNOSIS — Z5181 Encounter for therapeutic drug level monitoring: Secondary | ICD-10-CM

## 2018-01-03 DIAGNOSIS — I4891 Unspecified atrial fibrillation: Secondary | ICD-10-CM

## 2018-01-03 DIAGNOSIS — Z7901 Long term (current) use of anticoagulants: Secondary | ICD-10-CM

## 2018-01-03 DIAGNOSIS — I482 Chronic atrial fibrillation, unspecified: Secondary | ICD-10-CM

## 2018-01-03 LAB — POCT INR: INR: 1.6

## 2018-01-03 NOTE — Patient Instructions (Signed)
Description   Take 1.5 tablets today Wednesday Feb 27, then increase dose to 1 tablet daily except 1/2 tablet each Sunday, Tuesday and Thursday.  Repeat INR in 3 weeks

## 2018-01-13 DIAGNOSIS — L821 Other seborrheic keratosis: Secondary | ICD-10-CM | POA: Diagnosis not present

## 2018-01-13 DIAGNOSIS — L853 Xerosis cutis: Secondary | ICD-10-CM | POA: Diagnosis not present

## 2018-01-13 DIAGNOSIS — L820 Inflamed seborrheic keratosis: Secondary | ICD-10-CM | POA: Diagnosis not present

## 2018-01-24 ENCOUNTER — Ambulatory Visit (INDEPENDENT_AMBULATORY_CARE_PROVIDER_SITE_OTHER): Payer: Medicare Other | Admitting: Pharmacist

## 2018-01-24 DIAGNOSIS — Z5181 Encounter for therapeutic drug level monitoring: Secondary | ICD-10-CM

## 2018-01-24 DIAGNOSIS — Z7901 Long term (current) use of anticoagulants: Secondary | ICD-10-CM | POA: Diagnosis not present

## 2018-01-24 DIAGNOSIS — I482 Chronic atrial fibrillation, unspecified: Secondary | ICD-10-CM

## 2018-01-24 DIAGNOSIS — I4891 Unspecified atrial fibrillation: Secondary | ICD-10-CM | POA: Diagnosis not present

## 2018-01-24 LAB — POCT INR: INR: 3

## 2018-02-13 ENCOUNTER — Other Ambulatory Visit: Payer: Self-pay | Admitting: Cardiology

## 2018-02-28 ENCOUNTER — Ambulatory Visit (INDEPENDENT_AMBULATORY_CARE_PROVIDER_SITE_OTHER): Payer: Medicare Other | Admitting: Pharmacist

## 2018-02-28 DIAGNOSIS — Z5181 Encounter for therapeutic drug level monitoring: Secondary | ICD-10-CM | POA: Diagnosis not present

## 2018-02-28 DIAGNOSIS — I482 Chronic atrial fibrillation, unspecified: Secondary | ICD-10-CM

## 2018-02-28 DIAGNOSIS — I4891 Unspecified atrial fibrillation: Secondary | ICD-10-CM | POA: Diagnosis not present

## 2018-02-28 DIAGNOSIS — Z7901 Long term (current) use of anticoagulants: Secondary | ICD-10-CM

## 2018-02-28 LAB — POCT INR: INR: 3.6

## 2018-02-28 NOTE — Patient Instructions (Addendum)
Description   No warfarin tomorrow (since already taken today) then  Continue to 1 tablet daily except 1/2 tablet each Sunday, Tuesday and Thursday.  Repeat INR in 3 weeks

## 2018-03-13 DIAGNOSIS — C44729 Squamous cell carcinoma of skin of left lower limb, including hip: Secondary | ICD-10-CM | POA: Diagnosis not present

## 2018-03-13 DIAGNOSIS — L97921 Non-pressure chronic ulcer of unspecified part of left lower leg limited to breakdown of skin: Secondary | ICD-10-CM | POA: Diagnosis not present

## 2018-03-13 DIAGNOSIS — L570 Actinic keratosis: Secondary | ICD-10-CM | POA: Diagnosis not present

## 2018-03-19 ENCOUNTER — Telehealth: Payer: Self-pay | Admitting: Cardiology

## 2018-03-19 NOTE — Telephone Encounter (Signed)
New message        [facility] Pre-operative Risk Assessment    Request for surgical clearance:  1. What type of surgery is being performed? Mohs Surgery  2. When is this surgery scheduled? 03/21/18  3. What type of clearance is required (medical clearance vs. Pharmacy clearance to hold med vs. Both)? Pharmacy  4. Are there any medications that need to be held prior to surgery and how long?warfarin (COUMADIN) 5 MG tablet  5. Practice name and name of physician performing surgery? [facility]  6. What is your office phone number [phone] ext [phone]   7.   What is your office fax number [phone] ATTN: [facility]  8.   Anesthesia type (None, local, MAC, general) ? local   Laurier Nancy 03/19/2018, 10:38 AM  _________________________________________________________________   (provider comments below)

## 2018-03-20 NOTE — Telephone Encounter (Signed)
Request received to hold coumadin for Mohs surgery. Request sent to pharmacy with the below response:  Patient with diagnosis of atrial fibrillaiton on warfarin for anticoagulation.    Procedure: Mohs Surgery Date of procedure: 03/21/2018  CHADS2-VASc score of 4 ( HTN, AGE x2, female)  Per office protocol, NO NEED TO HOLD WARFARIN for dermatologic procedure.   *Patient already held 1 dose of warfarin while waiting for clearance response*   I will fax this to the requesting office.   Callback pool, please call patient and let her know that she should not hold coumadin.     Tami Lin Doylene Splinter, PA-C 03/20/2018, 3:28 PM [phone] [facility]

## 2018-03-20 NOTE — Telephone Encounter (Signed)
Patient with diagnosis of atrial fibrillaiton on warfarin for anticoagulation.    Procedure: Mohs Surgery Date of procedure: 03/21/2018  CHADS2-VASc score of 4 ( HTN, AGE x2, female)  Per office protocol, NO NEED TO HOLD WARFARIN for dermatologic procedure.   *Patient already held 1 dose of warfarin while waiting for clearance response*  Countess Biebel Rodriguez-Guzman PharmD, BCPS, [place] [id number] 03/20/2018 2:48 PM

## 2018-03-20 NOTE — Telephone Encounter (Deleted)
Pt takes warfarin for afib with CHADS2VASc score of 5 (age x2, sex, CAD, HTN). Procedure is tomorrow so pt can only hold warfarin today prior to procedure tomorrow. This is fine as long as dermatology office is ok with only holding 1 day.

## 2018-03-20 NOTE — Telephone Encounter (Signed)
I tried to reach the pt to advised she does not need to hold coumadin and that she needs to restart, I lmptcb ..  I then called the surgeons office and they are aware pt does not need to hold coumadin. I also told them that I did try to reach the pt to let her know she needs to resume coumadin, though lmom. Dermatology office said they will let the pt know tomorrow when she comes in for her procedure if she is still holding coumadin she needs to restart. I thanked the nurse for her help as well.. I will remove from pre op call back pool.

## 2018-03-21 DIAGNOSIS — C44729 Squamous cell carcinoma of skin of left lower limb, including hip: Secondary | ICD-10-CM | POA: Diagnosis not present

## 2018-03-22 NOTE — Progress Notes (Signed)
[name] Date of Birth: 1932/03/10   History of Present Illness: [name] is seen for follow up Afib. She has a history of chronic atrial fibrillation, hypertensive heart disease, and pulmonary hypertension. She has a history of chronic back problems. She has moderate MR and COPD. In October she developed progressive weakness in her legs. We held her lipitor without improvement. MRI was done. This showed some chronic fatty atrophy of the muscles c/w denervation in the peroneal nerve distribution. On her last visit we discussed switching to a DOAC but she could not afford it.   On follow up today she is doing  well. She recently moved to a new apartment. Denies any increase in SOB. No chest pain. No edema. She is sedentary. Walks with a cane or walker. She did have a SSCA removed from her left leg recently and it is still wrapped.    Current Outpatient Medications on File Prior to Visit  Medication Sig Dispense Refill  . albuterol (PROVENTIL HFA;VENTOLIN HFA) 108 (90 Base) MCG/ACT inhaler Inhale 2 puffs into the lungs every 6 (six) hours as needed for wheezing or shortness of breath. 1 Inhaler 2  . Ascorbic Acid (VITAMIN C) 1000 MG tablet Take 1,000 mg by mouth daily.      Marland Kitchen atorvastatin (LIPITOR) 20 MG tablet Take 1 tablet (20 mg total) by mouth daily. 90 tablet 3  . Calcium Carbonate-Vitamin D (CALCIUM + D PO) Take by mouth daily.      . Cholecalciferol (VITAMIN D PO) Take by mouth daily.      . Cyanocobalamin (VITAMIN B-12 PO) Take by mouth as directed.    . diltiazem (CARDIZEM CD) 120 MG 24 hr capsule TAKE 1 CAPSULE (120 MG TOTAL) BY MOUTH DAILY. 30 capsule 0  . estrogens, conjugated, (PREMARIN) 1.25 MG tablet Take 1.25 mg by mouth daily.      . furosemide (LASIX) 40 MG tablet TAKE 1/2 TABLET BY MOUTH DAILY 30 tablet 4  . gabapentin (NEURONTIN) 300 MG capsule Take 1 capsule by mouth daily.    Marland Kitchen HYDROcodone-acetaminophen (NORCO) 10-325 MG per tablet Take 1 tablet by mouth  every 6 (six) hours as needed.    Marland Kitchen KLOR-CON M20 20 MEQ tablet TAKE 1 TABLET (20 MEQ TOTAL) BY MOUTH 2 (TWO) TIMES DAILY. 60 tablet 11  . lisinopril (PRINIVIL,ZESTRIL) 20 MG tablet Take 1 tablet (20 mg total) by mouth daily. 30 tablet 6  . Multiple Vitamins-Minerals (ZINC PO) Take by mouth daily.      . sertraline (ZOLOFT) 50 MG tablet Take 1 tablet by mouth daily.  3  . valACYclovir (VALTREX) 500 MG tablet     . vitamin A 8000 UNIT capsule Take 8,000 Units by mouth daily.      Marland Kitchen warfarin (COUMADIN) 5 MG tablet Take 1/2 to 1 tablet daily as directed by coumadin clinic 90 tablet 3  . [DISCONTINUED] diltiazem (CARDIZEM) 120 MG tablet Take 1 tablet (120 mg total) by mouth daily. 30 tablet 5   No current facility-administered medications on file prior to visit.     Allergies  Allergen Reactions  . Codeine Nausea And Vomiting  . Penicillins   . Sulfonamide Derivatives Rash    Past Medical History:  Diagnosis Date  . Anemia   . Anticoagulated on Coumadin    CHRONIC  . Arthritis   . Back pain, chronic   . Chronic atrial fibrillation ([place])   . Coronary artery disease CARDIOLOGIST- DR [name]--  LOV IN EPIC  .  Depression   . Hammer toe   . Hypercholesterolemia   . Hypertension   . Hypertensive heart disease    WITH LVH  . LVH (left ventricular hypertrophy) due to hypertensive disease   . Mitral insufficiency    CHRONIC  . Pulmonary hypertension ([place])   . Transfusion history     Past Surgical History:  Procedure Laterality Date  . ABDOMINAL HYSTERECTOMY    . ANTERIOR REPAIR/ [place] PUBOVAGINAL SLING  05-03-2002  . BUNIONECTOMY WITH HAMMERTOE RECONSTRUCTION Right 03/01/2013   Procedure: RIGHT FOOT EXCISION BUNIONETTE AND FUSION OF DIP FOURTH TOE;  Surgeon: [name], MD;  Location: [facility];  Service: Orthopedics;  Laterality: Right;  . CARDIAC CATHETERIZATION  02-03-2009  DR  [name]   SINGLE-VESSEL OBSTRUCTIVE CAD, SMALL DIAGONAL BRANCH/ NORMAL LVF/  SEVERE MITRAL INSUFFICIENCY/ SEVERE PULMONARY HYPERTENSION  . CATARACT EXTRACTION W/ INTRAOCULAR LENS  IMPLANT, BILATERAL  2003  . HAMMER TOE SURGERY  02/28/2012   Procedure: HAMMER TOE CORRECTION;  Surgeon: [name], MD;  Location: [facility];  Service: Orthopedics;  Laterality: Right;  FUSION OF PROXIMAL INTERPHALANGEAL  RIGHT THIRD CLAW TOE  . PARS PLANA VITRECTOMY W/ REPAIR OF MACULAR HOLE  02-04-2002   LEFT EYE  . REPAIR RIGHT SECOND CLAW TOE/ VARUS MEDIAL CLOSING WEDGE OSTEOTOMY PROXIMAL PHALANX RIGHT GREAT TOE  05-05-2005  . TOTAL HIP ARTHROPLASTY     BILATERAL---  LEFT 4/98;   RIGHT  11/98  . VARUS OSTEOTOMY PROXIMAL PHALANX, LEFT GREAT TOE/ PARING DOWN THE CALLUS , SECOND LEFT TOE  10-27-2008    Social History   Tobacco Use  Smoking Status Never Smoker  Smokeless Tobacco Never Used    Social History   Substance and Sexual Activity  Alcohol Use Yes   Comment: occasional    Family History  Problem Relation Age of Onset  . Heart disease Mother 66  . Prostate cancer Father 75    Review of Systems:  As noted in history of present illness.  All other systems were reviewed and are negative.  Physical Exam: BP 140/80 (BP Location: Left Arm)   Pulse 86   Ht 5\' 4"  (1.626 m)   Wt 154 lb (69.9 kg)   BMI 26.43 kg/m  GENERAL:  Well appearing HEENT:  PERRL, EOMI, sclera are clear. Oropharynx is clear. NECK:  No jugular venous distention, carotid upstroke brisk and symmetric, no bruits, no thyromegaly or adenopathy LUNGS:  Clear to auscultation bilaterally CHEST:  Unremarkable HEART:  RRR,  PMI not displaced or sustained,S1 and S2 within normal limits, no S3, no S4: no clicks, no rubs, 0-1/0 systolic murmur ABD:  Soft, nontender. BS +, no masses or bruits. No hepatomegaly, no splenomegaly EXT:  2 + pulses throughout, no edema, left leg wrapped. SKIN:  Warm and dry.  No rashes NEURO:  Alert and oriented x 3. Cranial nerves II through XII  intact. PSYCH:  Cognitively intact   LABORATORY DATA: INR today is pending  Lab Results  Component Value Date   WBC 3.8 (L) 01/26/2012   HGB 12.2 03/01/2013   HCT 36.0 03/01/2013   PLT 225.0 01/26/2012   GLUCOSE 108 (H) 08/25/2016   CHOL 157 01/26/2012   TRIG 91.0 01/26/2012   HDL 70.10 01/26/2012   LDLCALC 69 01/26/2012   ALT 15 01/26/2012   AST 22 01/26/2012   NA 141 08/25/2016   K 4.8 08/25/2016   CL 103 08/25/2016   CREATININE 1.09 (H) 08/25/2016   BUN  18 08/25/2016   CO2 24 08/25/2016   TSH 1.170 03/26/2014   INR 3.6 02/28/2018   HGBA1C 5.9 (H) 03/26/2014    Labs reviewed from 02/24/17: cholesterol 174, triglycerides 110, LDL 90, HDL 62. A1c 6.0%. CMET and TSH normal. Dated 08/24/17: cholesterol 157, triglycerides 106, HDL 55, LDL 90. A1c 6.1%. CMET and CBC normal  Ecg today shows Afib with rate 86. ? Old septal infarct. I have personally reviewed and interpreted this study.  Assessment / Plan: 1. Atrial fibrillation, permanent. Rate is well controlled on diltiazem. She is anticoagulated on Coumadin. Check INR today.   2. Mild to Moderate MR. Last Echo 8/16. No change in exam.  3. Hypertension with hypertensive heart disease, BP well controlled.  4. Hyperlipidemia on statin therapy. Last LDL 90 on statin. Will update labs today  5. Pulmonary hypertension-moderate  6. Coronary disease. Cardiac catheterization in 2010 showed severe stenosis in a small diagonal branch. She otherwise had no significant disease. She is asymptomatic.   7. Chronic bronchitis and COPD. Currently well controlled.   Follow up in 6 months.

## 2018-03-23 ENCOUNTER — Ambulatory Visit: Payer: Medicare Other | Admitting: Cardiology

## 2018-03-23 ENCOUNTER — Other Ambulatory Visit: Payer: Self-pay | Admitting: Cardiology

## 2018-03-23 DIAGNOSIS — G894 Chronic pain syndrome: Secondary | ICD-10-CM | POA: Diagnosis not present

## 2018-03-23 DIAGNOSIS — M545 Low back pain: Secondary | ICD-10-CM | POA: Diagnosis not present

## 2018-03-23 DIAGNOSIS — Z79891 Long term (current) use of opiate analgesic: Secondary | ICD-10-CM | POA: Diagnosis not present

## 2018-03-23 NOTE — Telephone Encounter (Signed)
Rx sent to pharmacy   

## 2018-03-27 ENCOUNTER — Ambulatory Visit (INDEPENDENT_AMBULATORY_CARE_PROVIDER_SITE_OTHER): Payer: Medicare Other | Admitting: Cardiology

## 2018-03-27 ENCOUNTER — Ambulatory Visit (INDEPENDENT_AMBULATORY_CARE_PROVIDER_SITE_OTHER): Payer: Medicare Other | Admitting: Pharmacist Clinician (PhC)/ Clinical Pharmacy Specialist

## 2018-03-27 ENCOUNTER — Encounter: Payer: Self-pay | Admitting: Cardiology

## 2018-03-27 VITALS — BP 140/80 | HR 86 | Ht 64.0 in | Wt 154.0 lb

## 2018-03-27 DIAGNOSIS — I272 Pulmonary hypertension, unspecified: Secondary | ICD-10-CM | POA: Diagnosis not present

## 2018-03-27 DIAGNOSIS — Z5181 Encounter for therapeutic drug level monitoring: Secondary | ICD-10-CM

## 2018-03-27 DIAGNOSIS — I482 Chronic atrial fibrillation, unspecified: Secondary | ICD-10-CM

## 2018-03-27 DIAGNOSIS — Z7901 Long term (current) use of anticoagulants: Secondary | ICD-10-CM

## 2018-03-27 DIAGNOSIS — I4891 Unspecified atrial fibrillation: Secondary | ICD-10-CM

## 2018-03-27 DIAGNOSIS — I10 Essential (primary) hypertension: Secondary | ICD-10-CM

## 2018-03-27 DIAGNOSIS — E7800 Pure hypercholesterolemia, unspecified: Secondary | ICD-10-CM

## 2018-03-27 LAB — POCT INR: INR: 4.6 — AB (ref 2.0–3.0)

## 2018-03-27 NOTE — Patient Instructions (Signed)
Description   No warfarin today Tuesday May 21, then take only 1/2 tablet Wednesday May 22.  Then decrease dose to 1 tablet each Monday, Wednesday and Friday, 1/2 tablet all other days. Repeat INR in 2 weeks

## 2018-03-28 ENCOUNTER — Telehealth: Payer: Self-pay | Admitting: Cardiology

## 2018-03-28 DIAGNOSIS — E7800 Pure hypercholesterolemia, unspecified: Secondary | ICD-10-CM

## 2018-03-28 DIAGNOSIS — I10 Essential (primary) hypertension: Secondary | ICD-10-CM

## 2018-03-28 DIAGNOSIS — I119 Hypertensive heart disease without heart failure: Secondary | ICD-10-CM

## 2018-03-28 NOTE — Telephone Encounter (Signed)
Pt is calling   Pt need want her lab result and a copy to be sent to her. Please advise pt

## 2018-03-28 NOTE — Telephone Encounter (Signed)
Spoke to pt. Let her know that Dr. [name] has not reviewed her lab results yet, but as soon as he does, [name] will call and send her a copy. Pt verbalized thanks and understanding.  Routing to [place] as FYI.

## 2018-03-29 NOTE — Telephone Encounter (Signed)
Spoke to patient advised labs not available.[facility] having technical problems.Advised I will call her with results after Dr.Jordan reviews.

## 2018-03-30 ENCOUNTER — Other Ambulatory Visit: Payer: Self-pay

## 2018-03-30 DIAGNOSIS — E7800 Pure hypercholesterolemia, unspecified: Secondary | ICD-10-CM

## 2018-03-30 DIAGNOSIS — I119 Hypertensive heart disease without heart failure: Secondary | ICD-10-CM

## 2018-03-30 DIAGNOSIS — I10 Essential (primary) hypertension: Secondary | ICD-10-CM

## 2018-03-30 LAB — CBC WITH DIFFERENTIAL/PLATELET
Basophils Absolute: 0 10*3/uL (ref 0.0–0.2)
Basos: 1 %
EOS (ABSOLUTE): 0.3 10*3/uL (ref 0.0–0.4)
Eos: 7 %
HEMOGLOBIN: 12.6 g/dL (ref 11.1–15.9)
Hematocrit: 38.6 % (ref 34.0–46.6)
IMMATURE GRANULOCYTES: 0 %
Immature Grans (Abs): 0 10*3/uL (ref 0.0–0.1)
LYMPHS ABS: 1 10*3/uL (ref 0.7–3.1)
Lymphs: 22 %
MCH: 29.8 pg (ref 26.6–33.0)
MCHC: 32.6 g/dL (ref 31.5–35.7)
MCV: 91 fL (ref 79–97)
Monocytes Absolute: 0.5 10*3/uL (ref 0.1–0.9)
Monocytes: 11 %
Neutrophils Absolute: 2.7 10*3/uL (ref 1.4–7.0)
Neutrophils: 59 %
Platelets: 356 10*3/uL (ref 150–450)
RBC: 4.23 x10E6/uL (ref 3.77–5.28)
RDW: 14.9 % (ref 12.3–15.4)
WBC: 4.6 10*3/uL (ref 3.4–10.8)

## 2018-03-30 LAB — HEPATIC FUNCTION PANEL

## 2018-03-30 LAB — BASIC METABOLIC PANEL

## 2018-03-30 LAB — LIPID PANEL W/O CHOL/HDL RATIO

## 2018-03-30 LAB — HEMOGLOBIN A1C
Est. average glucose Bld gHb Est-mCnc: 131 mg/dL
Hgb A1c MFr Bld: 6.2 % — ABNORMAL HIGH (ref 4.8–5.6)

## 2018-03-30 NOTE — Telephone Encounter (Signed)
Received a call from [facility].Patient will need to have lipid,hepatic panels,cbc repeated at no charge.Previous lab done was mis placed.Called patient no answer.[place].

## 2018-03-30 NOTE — Telephone Encounter (Signed)
Received a call from patient advised she will need to have bmet,lipid and hepatic repeated at no charge due to lab error.Stated she have done 6/4 when she has INR check.

## 2018-04-03 ENCOUNTER — Telehealth: Payer: Self-pay | Admitting: Cardiology

## 2018-04-03 NOTE — Telephone Encounter (Signed)
[facility] added to patient's pharmacy.

## 2018-04-03 NOTE — Telephone Encounter (Signed)
New message    Patient  Is changing pharmacies can you please add [facility] [phone]  [address] [place]

## 2018-04-10 ENCOUNTER — Telehealth: Payer: Self-pay | Admitting: Cardiology

## 2018-04-10 ENCOUNTER — Ambulatory Visit (INDEPENDENT_AMBULATORY_CARE_PROVIDER_SITE_OTHER): Payer: Medicare Other | Admitting: Pharmacist

## 2018-04-10 DIAGNOSIS — Z5181 Encounter for therapeutic drug level monitoring: Secondary | ICD-10-CM | POA: Diagnosis not present

## 2018-04-10 DIAGNOSIS — I4891 Unspecified atrial fibrillation: Secondary | ICD-10-CM | POA: Diagnosis not present

## 2018-04-10 DIAGNOSIS — I482 Chronic atrial fibrillation, unspecified: Secondary | ICD-10-CM

## 2018-04-10 DIAGNOSIS — I10 Essential (primary) hypertension: Secondary | ICD-10-CM | POA: Diagnosis not present

## 2018-04-10 DIAGNOSIS — I119 Hypertensive heart disease without heart failure: Secondary | ICD-10-CM | POA: Diagnosis not present

## 2018-04-10 DIAGNOSIS — Z7901 Long term (current) use of anticoagulants: Secondary | ICD-10-CM | POA: Diagnosis not present

## 2018-04-10 DIAGNOSIS — E7800 Pure hypercholesterolemia, unspecified: Secondary | ICD-10-CM | POA: Diagnosis not present

## 2018-04-10 LAB — BASIC METABOLIC PANEL
BUN / CREAT RATIO: 18 (ref 12–28)
BUN: 19 mg/dL (ref 8–27)
CHLORIDE: 100 mmol/L (ref 96–106)
CO2: 27 mmol/L (ref 20–29)
Calcium: 10.5 mg/dL — ABNORMAL HIGH (ref 8.7–10.3)
Creatinine, Ser: 1.03 mg/dL — ABNORMAL HIGH (ref 0.57–1.00)
GFR calc Af Amer: 58 mL/min/{1.73_m2} — ABNORMAL LOW (ref >59)
GFR calc non Af Amer: 50 mL/min/{1.73_m2} — ABNORMAL LOW (ref >59)
Glucose: 103 mg/dL — ABNORMAL HIGH (ref 65–99)
POTASSIUM: 4.6 mmol/L (ref 3.5–5.2)
Sodium: 140 mmol/L (ref 134–144)

## 2018-04-10 LAB — HEPATIC FUNCTION PANEL
ALT: 18 IU/L (ref 0–32)
AST: 23 IU/L (ref 0–40)
Albumin: 4.2 g/dL (ref 3.5–4.7)
Alkaline Phosphatase: 76 IU/L (ref 39–117)
BILIRUBIN, DIRECT: 0.16 mg/dL (ref 0.00–0.40)
Bilirubin Total: 0.5 mg/dL (ref 0.0–1.2)
TOTAL PROTEIN: 7.3 g/dL (ref 6.0–8.5)

## 2018-04-10 LAB — LIPID PANEL W/O CHOL/HDL RATIO
Cholesterol, Total: 185 mg/dL (ref 100–199)
HDL: 57 mg/dL (ref >39)
LDL CALC: 94 mg/dL (ref 0–99)
Triglycerides: 171 mg/dL — ABNORMAL HIGH (ref 0–149)
VLDL Cholesterol Cal: 34 mg/dL (ref 5–40)

## 2018-04-10 LAB — POCT INR: INR: 2.3 (ref 2.0–3.0)

## 2018-04-10 NOTE — Telephone Encounter (Signed)
New Message:    Pt wold like a copy of her lab results from today when they are ready please.

## 2018-04-10 NOTE — Telephone Encounter (Signed)
Spoke to patient, made aware will send to primary nurse to make aware to mail results once available.  Patient verbalized understanding.

## 2018-04-12 NOTE — Telephone Encounter (Signed)
Spoke to patient lab results given.Copy of lab mailed to patient.

## 2018-04-12 NOTE — Telephone Encounter (Signed)
Spoke to patient lab results given.Copy of lab mailed to patient

## 2018-04-16 ENCOUNTER — Other Ambulatory Visit: Payer: Self-pay | Admitting: Cardiology

## 2018-04-17 NOTE — Telephone Encounter (Signed)
Returned call to patient advised I mailed labs last week.Advised to call back next week if she don't receive them.

## 2018-04-17 NOTE — Telephone Encounter (Signed)
Follow up    Patient requesting call from nurse.  She has not received her lab results via mail .

## 2018-04-20 ENCOUNTER — Telehealth: Payer: Self-pay | Admitting: Cardiology

## 2018-04-20 NOTE — Telephone Encounter (Signed)
Spoke to patient recent lab results reviewed.Stated she has been having diarrhea.She has appointment with PCP Mon 04/23/18.

## 2018-04-20 NOTE — Telephone Encounter (Signed)
Returned call to patient no answer.LMTC. 

## 2018-04-20 NOTE — Telephone Encounter (Signed)
Pt calling and has question concerning her lab results that was given to her by Malachy Mood

## 2018-05-03 DIAGNOSIS — I119 Hypertensive heart disease without heart failure: Secondary | ICD-10-CM | POA: Diagnosis not present

## 2018-05-03 DIAGNOSIS — Z23 Encounter for immunization: Secondary | ICD-10-CM | POA: Diagnosis not present

## 2018-05-03 DIAGNOSIS — E785 Hyperlipidemia, unspecified: Secondary | ICD-10-CM | POA: Diagnosis not present

## 2018-05-03 DIAGNOSIS — I4891 Unspecified atrial fibrillation: Secondary | ICD-10-CM | POA: Diagnosis not present

## 2018-05-04 ENCOUNTER — Telehealth: Payer: Self-pay

## 2018-05-04 NOTE — Telephone Encounter (Signed)
Faxed request for medical records to [facility] [phone] LM

## 2018-05-11 ENCOUNTER — Telehealth: Payer: Self-pay | Admitting: Gastroenterology

## 2018-05-14 ENCOUNTER — Encounter: Payer: Self-pay | Admitting: Gastroenterology

## 2018-05-14 NOTE — Telephone Encounter (Signed)
No problems Would be a pleasure to take care of her. Where are the records? [facility] or [place]?

## 2018-05-14 NOTE — Telephone Encounter (Signed)
We have the records at [facility], will call pt to schd and sent records to [facility]

## 2018-05-15 NOTE — Telephone Encounter (Signed)
Thanks [employer]

## 2018-05-23 ENCOUNTER — Other Ambulatory Visit: Payer: Self-pay | Admitting: Cardiology

## 2018-05-23 ENCOUNTER — Ambulatory Visit (INDEPENDENT_AMBULATORY_CARE_PROVIDER_SITE_OTHER): Payer: Medicare Other | Admitting: Pharmacist

## 2018-05-23 DIAGNOSIS — I482 Chronic atrial fibrillation, unspecified: Secondary | ICD-10-CM

## 2018-05-23 DIAGNOSIS — I4891 Unspecified atrial fibrillation: Secondary | ICD-10-CM

## 2018-05-23 DIAGNOSIS — E785 Hyperlipidemia, unspecified: Secondary | ICD-10-CM | POA: Diagnosis not present

## 2018-05-23 DIAGNOSIS — R7309 Other abnormal glucose: Secondary | ICD-10-CM | POA: Diagnosis not present

## 2018-05-23 DIAGNOSIS — Z7901 Long term (current) use of anticoagulants: Secondary | ICD-10-CM

## 2018-05-23 DIAGNOSIS — E559 Vitamin D deficiency, unspecified: Secondary | ICD-10-CM | POA: Diagnosis not present

## 2018-05-23 DIAGNOSIS — Z5181 Encounter for therapeutic drug level monitoring: Secondary | ICD-10-CM | POA: Diagnosis not present

## 2018-05-23 DIAGNOSIS — I10 Essential (primary) hypertension: Secondary | ICD-10-CM | POA: Diagnosis not present

## 2018-05-23 LAB — POCT INR: INR: 2.4 (ref 2.0–3.0)

## 2018-05-23 NOTE — Patient Instructions (Signed)
Description   Continue taking 1 tablet each Monday, Wednesday and Friday, 1/2 tablet all other days. Repeat INR in 4 weeks

## 2018-06-07 DIAGNOSIS — M25562 Pain in left knee: Secondary | ICD-10-CM | POA: Diagnosis not present

## 2018-06-07 DIAGNOSIS — M1712 Unilateral primary osteoarthritis, left knee: Secondary | ICD-10-CM | POA: Diagnosis not present

## 2018-06-07 DIAGNOSIS — M545 Low back pain: Secondary | ICD-10-CM | POA: Diagnosis not present

## 2018-06-21 ENCOUNTER — Ambulatory Visit: Payer: Medicare Other | Admitting: Gastroenterology

## 2018-06-26 ENCOUNTER — Ambulatory Visit: Payer: Medicare Other | Admitting: Gastroenterology

## 2018-06-27 ENCOUNTER — Ambulatory Visit (INDEPENDENT_AMBULATORY_CARE_PROVIDER_SITE_OTHER): Payer: Medicare Other | Admitting: Pharmacist Clinician (PhC)/ Clinical Pharmacy Specialist

## 2018-06-27 DIAGNOSIS — Z5181 Encounter for therapeutic drug level monitoring: Secondary | ICD-10-CM | POA: Diagnosis not present

## 2018-06-27 DIAGNOSIS — I482 Chronic atrial fibrillation, unspecified: Secondary | ICD-10-CM

## 2018-06-27 DIAGNOSIS — I4891 Unspecified atrial fibrillation: Secondary | ICD-10-CM | POA: Diagnosis not present

## 2018-06-27 DIAGNOSIS — M545 Low back pain: Secondary | ICD-10-CM | POA: Diagnosis not present

## 2018-06-27 DIAGNOSIS — Z7901 Long term (current) use of anticoagulants: Secondary | ICD-10-CM | POA: Diagnosis not present

## 2018-06-27 LAB — POCT INR: INR: 2.5 (ref 2.0–3.0)

## 2018-07-13 ENCOUNTER — Telehealth: Payer: Self-pay | Admitting: Cardiology

## 2018-07-13 NOTE — Telephone Encounter (Signed)
° °  Patient wants to discuss flu shot. Patient wants injection when she comes for coumadin appt. Requesting call from nurse

## 2018-07-13 NOTE — Telephone Encounter (Signed)
Returned call to patient advised we are not giving flu shots yet.Advised she can ask pharmacist when she has INR 07/25/18.

## 2018-07-23 ENCOUNTER — Encounter: Payer: Self-pay | Admitting: Gastroenterology

## 2018-07-24 ENCOUNTER — Telehealth: Payer: Self-pay | Admitting: Cardiology

## 2018-07-24 ENCOUNTER — Encounter

## 2018-07-24 ENCOUNTER — Encounter: Payer: Self-pay | Admitting: Gastroenterology

## 2018-07-24 ENCOUNTER — Ambulatory Visit (INDEPENDENT_AMBULATORY_CARE_PROVIDER_SITE_OTHER): Payer: Medicare Other | Admitting: Gastroenterology

## 2018-07-24 VITALS — BP 120/78 | HR 66 | Ht 64.0 in | Wt 158.1 lb

## 2018-07-24 DIAGNOSIS — K580 Irritable bowel syndrome with diarrhea: Secondary | ICD-10-CM

## 2018-07-24 NOTE — Telephone Encounter (Signed)
New Message:    Please call, she said you had some information for her.

## 2018-07-24 NOTE — Telephone Encounter (Signed)
Returned call to patient she wanted to know when we will start giving flu shots.Advised we have not received a email on flu shots yet.Advised to ask pharmacist at coumadin appointment tomorrow.

## 2018-07-24 NOTE — Patient Instructions (Signed)
If you are age 82 or older, your body mass index should be between 23-30. Your Body mass index is 27.14 kg/m. If this is out of the aforementioned range listed, please consider follow up with your Primary Care Provider.  If you are age 32 or younger, your body mass index should be between 19-25. Your Body mass index is 27.14 kg/m. If this is out of the aformentioned range listed, please consider follow up with your Primary Care Provider.   Please make an appointment with your Cardiologist.    Thank you,  Dr. Jackquline Denmark'

## 2018-07-24 NOTE — Progress Notes (Signed)
Chief Complaint:   Referring Provider:  Minette Brine, FNP      ASSESSMENT AND PLAN;   #1.  IBS with predom Diarrhea (resolved), neg stool studies, TSH, celiac. Nl CBC, CMP and HBA1c. Neg colon 07/25/2014 by Dr Collene Mares. Failed trial of creon, probiotics. Resolved after she stopped taking magnesium supplements and was switched back to Coumadin from Eliquis. Plan: - I have reassured the patient. She will get in touch with [place] in case of any problems.  - Continue imodium prn. - FU in 6 months.  Earlier, if she starts having any new problems.   HPI:    [name] is a 84 y.o. female  Patient of Dr. Collene Mares Here for second opinion for diarrhea.  The diarrhea has completely resolved.  Diarrhea initially started May 2018, associated with some anxiety, no nocturnal symptoms-negative extensive GI evaluation which included negative stool studies, colonoscopy 07/25/2014: neg except for mild pancolonic diverticulosis. (Bx were not taken as it was part of colorectal cancer screening).  No need to repeat unless problems. Nl TSH, celiac screen 2019.  She stopped taking magnesium supplements and was switched back from Eliquis to Coumadin.  No nausea, vomiting, heartburn, regurgitation, odynophagia or dysphagia.  No significant diarrhea or constipation.  There is no melena or hematochezia. No unintentional weight loss.  No abdominal pain.   Past Medical History:  Diagnosis Date  . Anemia   . Anticoagulated on Coumadin    CHRONIC  . Arthritis   . Back pain, chronic   . Chronic atrial fibrillation ([place])   . Coronary artery disease CARDIOLOGIST- DR [name]--  LOV IN EPIC  . Depression   . Hammer toe   . Hypercholesterolemia   . Hypertension   . Hypertensive heart disease    WITH LVH  . LVH (left ventricular hypertrophy) due to hypertensive disease   . Mitral insufficiency    CHRONIC  . Pulmonary hypertension ([place])   . Transfusion history     Past Surgical History:  Procedure  Laterality Date  . ABDOMINAL HYSTERECTOMY    . ANTERIOR REPAIR/ [place] PUBOVAGINAL SLING  05-03-2002  . BUNIONECTOMY WITH HAMMERTOE RECONSTRUCTION Right 03/01/2013   Procedure: RIGHT FOOT EXCISION BUNIONETTE AND FUSION OF DIP FOURTH TOE;  Surgeon: [name], MD;  Location: [facility];  Service: Orthopedics;  Laterality: Right;  . CARDIAC CATHETERIZATION  02-03-2009  DR  [name]   SINGLE-VESSEL OBSTRUCTIVE CAD, SMALL DIAGONAL BRANCH/ NORMAL LVF/ SEVERE MITRAL INSUFFICIENCY/ SEVERE PULMONARY HYPERTENSION  . CATARACT EXTRACTION W/ INTRAOCULAR LENS  IMPLANT, BILATERAL  2003  . COLONOSCOPY  07/25/2014   few mall scattered diverticula in the ectire examined colon. Small internal hemmorhoids.   Marland Kitchen HAMMER TOE SURGERY  02/28/2012   Procedure: HAMMER TOE CORRECTION;  Surgeon: [name], MD;  Location: [facility];  Service: Orthopedics;  Laterality: Right;  FUSION OF PROXIMAL INTERPHALANGEAL  RIGHT THIRD CLAW TOE  . PARS PLANA VITRECTOMY W/ REPAIR OF MACULAR HOLE  02-04-2002   LEFT EYE  . REPAIR RIGHT SECOND CLAW TOE/ VARUS MEDIAL CLOSING WEDGE OSTEOTOMY PROXIMAL PHALANX RIGHT GREAT TOE  05-05-2005  . TOTAL HIP ARTHROPLASTY     BILATERAL---  LEFT 4/98;   RIGHT  11/98  . VARUS OSTEOTOMY PROXIMAL PHALANX, LEFT GREAT TOE/ PARING DOWN THE CALLUS , SECOND LEFT TOE  10-27-2008    Family History  Problem Relation Age of Onset  . Heart disease Mother 38  . Prostate cancer Father 45  Social History   Tobacco Use  . Smoking status: Never Smoker  . Smokeless tobacco: Never Used  Substance Use Topics  . Alcohol use: Yes    Comment: occasional  . Drug use: No    Current Outpatient Medications  Medication Sig Dispense Refill  . albuterol (PROVENTIL HFA;VENTOLIN HFA) 108 (90 Base) MCG/ACT inhaler Inhale 2 puffs into the lungs every 6 (six) hours as needed for wheezing or shortness of breath. 1 Inhaler 2  . Ascorbic Acid (VITAMIN C) 1000 MG tablet Take 1,000  mg by mouth daily.      Marland Kitchen atorvastatin (LIPITOR) 20 MG tablet Take 1 tablet (20 mg total) by mouth daily. 90 tablet 3  . Calcium Carbonate-Vitamin D (CALCIUM + D PO) Take by mouth daily.      . Cholecalciferol (VITAMIN D PO) Take by mouth daily.      . Cyanocobalamin (VITAMIN B-12 PO) Take by mouth as directed.    . diltiazem (CARDIZEM CD) 120 MG 24 hr capsule TAKE 1 CAPSULE (120 MG TOTAL) BY MOUTH DAILY. 30 capsule 0  . diltiazem (TIAZAC) 120 MG 24 hr capsule TAKE ONE CAPSULE BY MOUTH EVERY DAY 30 capsule 6  . estrogens, conjugated, (PREMARIN) 1.25 MG tablet Take 1.25 mg by mouth daily.      . furosemide (LASIX) 40 MG tablet TAKE 1/2 TABLET BY MOUTH DAILY 30 tablet 4  . gabapentin (NEURONTIN) 300 MG capsule Take 1 capsule by mouth daily.    Marland Kitchen HYDROcodone-acetaminophen (NORCO) 10-325 MG per tablet Take 1 tablet by mouth every 6 (six) hours as needed.    Marland Kitchen KLOR-CON M20 20 MEQ tablet TAKE 1 TABLET (20 MEQ TOTAL) BY MOUTH 2 (TWO) TIMES DAILY. 60 tablet 10  . lisinopril (PRINIVIL,ZESTRIL) 20 MG tablet Take 1 tablet (20 mg total) by mouth daily. 30 tablet 6  . Multiple Vitamins-Minerals (ZINC PO) Take by mouth daily.      . sertraline (ZOLOFT) 50 MG tablet Take 1 tablet by mouth daily.  3  . valACYclovir (VALTREX) 500 MG tablet     . vitamin A 8000 UNIT capsule Take 8,000 Units by mouth daily.      Marland Kitchen warfarin (COUMADIN) 5 MG tablet TAKE 1/2 TO 1 TABLET BY MOUTH EVERY DAY AS DIRECTED BY COUMADIN CLINIC 90 tablet 1   No current facility-administered medications for this visit.     No Active Allergies  Review of Systems:  Constitutional: Denies fever, chills, diaphoresis, appetite change and fatigue.  HEENT: Denies photophobia, eye pain, redness, hearing loss, ear pain, congestion, sore throat, rhinorrhea, sneezing, mouth sores, neck pain, neck stiffness and tinnitus.   Respiratory: Denies SOB, DOE, cough, chest tightness,  and wheezing.   Cardiovascular: Denies chest pain, palpitations and leg  swelling.  Genitourinary: Denies dysuria, urgency, frequency, hematuria, flank pain and difficulty urinating.  Musculoskeletal: Has myalgias, back pain, joint swelling, arthralgias and gait problem.  Skin: No rash.  Neurological: Denies dizziness, seizures, syncope, weakness, light-headedness, numbness and headaches.  Hematological: Denies adenopathy. Easy bruising, personal or family bleeding history  Psychiatric/Behavioral: Has anxiety or depression     Physical Exam:    BP 120/78   Pulse 66   Ht 5\' 4"  (1.626 m)   Wt 158 lb 2 oz (71.7 kg)   BMI 27.14 kg/m  Filed Weights   07/24/18 0928  Weight: 158 lb 2 oz (71.7 kg)   Constitutional:  Well-developed, in no acute distress. Psychiatric: Normal mood and affect. Behavior is normal. HEENT: Pupils normal.  Conjunctivae are normal. No scleral icterus. Neck supple.  Cardiovascular: Normal rate, regular rhythm. No edema Pulmonary/chest: Effort normal and breath sounds normal. No wheezing, rales or rhonchi. Abdominal: Soft, nondistended. Nontender. Bowel sounds active throughout. There are no masses palpable. No hepatomegaly. Rectal:  defered Neurological: Alert and oriented to person place and time. Skin: Skin is warm and dry. No rashes noted.  Data Reviewed: I have personally reviewed following labs and imaging studies  CBC: CBC Latest Ref Rng & Units 03/27/2018 03/01/2013 02/28/2012  WBC 3.4 - 10.8 x10E3/uL 4.6 - -  Hemoglobin 11.1 - 15.9 g/dL 12.6 12.2 13.3  Hematocrit 34.0 - 46.6 % 38.6 36.0 39.0  Platelets 150 - 450 x10E3/uL 356 - -    CMP: CMP Latest Ref Rng & Units 04/10/2018 03/27/2018 08/25/2016  Glucose 65 - 99 mg/dL 103(H) CANCELED 108(H)  BUN 8 - 27 mg/dL 19 CANCELED 18  Creatinine 0.57 - 1.00 mg/dL 1.03(H) CANCELED 1.09(H)  Sodium 134 - 144 mmol/L 140 CANCELED 141  Potassium 3.5 - 5.2 mmol/L 4.6 CANCELED 4.8  Chloride 96 - 106 mmol/L 100 CANCELED 103  CO2 20 - 29 mmol/L 27 CANCELED 24  Calcium 8.7 - 10.3 mg/dL  10.5(H) CANCELED 11.1(H)  Total Protein 6.0 - 8.5 g/dL 7.3 CANCELED -  Total Bilirubin 0.0 - 1.2 mg/dL 0.5 CANCELED -  Alkaline Phos 39 - 117 IU/L 76 CANCELED -  AST 0 - 40 IU/L 23 CANCELED -  ALT 0 - 32 IU/L 18 CANCELED -   30 minutes spent, reviewing about 20 pages of records, and with patient today. Greater than 50% was spent in counseling and coordination of care with the patient.  Review of the labs and colonoscopy are summarized above.    Carmell Austria, MD 07/24/2018, 9:47 AM  Cc: Minette Brine, FNP

## 2018-07-25 ENCOUNTER — Ambulatory Visit (INDEPENDENT_AMBULATORY_CARE_PROVIDER_SITE_OTHER): Payer: Medicare Other | Admitting: Pharmacist Clinician (PhC)/ Clinical Pharmacy Specialist

## 2018-07-25 DIAGNOSIS — Z5181 Encounter for therapeutic drug level monitoring: Secondary | ICD-10-CM | POA: Diagnosis not present

## 2018-07-25 DIAGNOSIS — I4891 Unspecified atrial fibrillation: Secondary | ICD-10-CM

## 2018-07-25 DIAGNOSIS — Z7901 Long term (current) use of anticoagulants: Secondary | ICD-10-CM

## 2018-07-25 DIAGNOSIS — I482 Chronic atrial fibrillation, unspecified: Secondary | ICD-10-CM

## 2018-07-25 LAB — POCT INR: INR: 3.6 — AB (ref 2.0–3.0)

## 2018-07-25 NOTE — Patient Instructions (Signed)
Description   No warfarin today Wednesday Sept 18, then continue taking 1 tablet each Monday, Wednesday and Friday, 1/2 tablet all other days. Repeat INR in 4 weeks

## 2018-08-07 ENCOUNTER — Ambulatory Visit (INDEPENDENT_AMBULATORY_CARE_PROVIDER_SITE_OTHER): Payer: Medicare Other | Admitting: Internal Medicine

## 2018-08-07 DIAGNOSIS — Z23 Encounter for immunization: Secondary | ICD-10-CM | POA: Diagnosis not present

## 2018-08-07 NOTE — Progress Notes (Signed)
fl

## 2018-08-08 ENCOUNTER — Encounter: Payer: Self-pay | Admitting: Internal Medicine

## 2018-08-08 DIAGNOSIS — Z23 Encounter for immunization: Secondary | ICD-10-CM | POA: Insufficient documentation

## 2018-08-23 ENCOUNTER — Ambulatory Visit (INDEPENDENT_AMBULATORY_CARE_PROVIDER_SITE_OTHER): Payer: Medicare Other | Admitting: Pharmacist Clinician (PhC)/ Clinical Pharmacy Specialist

## 2018-08-23 DIAGNOSIS — Z5181 Encounter for therapeutic drug level monitoring: Secondary | ICD-10-CM

## 2018-08-23 DIAGNOSIS — I4891 Unspecified atrial fibrillation: Secondary | ICD-10-CM

## 2018-08-23 DIAGNOSIS — Z7901 Long term (current) use of anticoagulants: Secondary | ICD-10-CM

## 2018-08-23 DIAGNOSIS — H35033 Hypertensive retinopathy, bilateral: Secondary | ICD-10-CM | POA: Diagnosis not present

## 2018-08-23 DIAGNOSIS — I482 Chronic atrial fibrillation, unspecified: Secondary | ICD-10-CM | POA: Diagnosis not present

## 2018-08-23 DIAGNOSIS — H40013 Open angle with borderline findings, low risk, bilateral: Secondary | ICD-10-CM | POA: Diagnosis not present

## 2018-08-23 DIAGNOSIS — H35342 Macular cyst, hole, or pseudohole, left eye: Secondary | ICD-10-CM | POA: Diagnosis not present

## 2018-08-23 DIAGNOSIS — H353132 Nonexudative age-related macular degeneration, bilateral, intermediate dry stage: Secondary | ICD-10-CM | POA: Diagnosis not present

## 2018-08-23 LAB — POCT INR: INR: 2.1 (ref 2.0–3.0)

## 2018-08-29 ENCOUNTER — Other Ambulatory Visit: Payer: Self-pay | Admitting: Nurse Practitioner

## 2018-08-29 DIAGNOSIS — E7800 Pure hypercholesterolemia, unspecified: Secondary | ICD-10-CM

## 2018-08-29 MED ORDER — ATORVASTATIN CALCIUM 20 MG PO TABS: 20.0000 mg | ORAL_TABLET | Freq: Every day | ORAL | 1 refills | Status: DC

## 2018-09-04 ENCOUNTER — Encounter: Payer: Self-pay | Admitting: Nurse Practitioner

## 2018-09-04 DIAGNOSIS — E559 Vitamin D deficiency, unspecified: Secondary | ICD-10-CM

## 2018-09-04 DIAGNOSIS — I4821 Permanent atrial fibrillation: Secondary | ICD-10-CM | POA: Insufficient documentation

## 2018-09-04 DIAGNOSIS — R7309 Other abnormal glucose: Secondary | ICD-10-CM | POA: Insufficient documentation

## 2018-09-04 DIAGNOSIS — I4891 Unspecified atrial fibrillation: Secondary | ICD-10-CM

## 2018-09-06 ENCOUNTER — Encounter: Payer: Self-pay | Admitting: Nurse Practitioner

## 2018-09-06 ENCOUNTER — Ambulatory Visit (INDEPENDENT_AMBULATORY_CARE_PROVIDER_SITE_OTHER): Payer: Medicare Other | Admitting: Nurse Practitioner

## 2018-09-06 VITALS — BP 138/80 | HR 88 | Temp 97.7°F | Ht 62.0 in | Wt 164.8 lb

## 2018-09-06 DIAGNOSIS — E785 Hyperlipidemia, unspecified: Secondary | ICD-10-CM

## 2018-09-06 DIAGNOSIS — I10 Essential (primary) hypertension: Secondary | ICD-10-CM | POA: Diagnosis not present

## 2018-09-06 DIAGNOSIS — I4891 Unspecified atrial fibrillation: Secondary | ICD-10-CM | POA: Diagnosis not present

## 2018-09-06 DIAGNOSIS — R7303 Prediabetes: Secondary | ICD-10-CM

## 2018-09-06 NOTE — Progress Notes (Signed)
Subjective:     Patient ID: [name] , female    DOB: 06/21/1932 , 84 y.o.   MRN: [id number]   Chief Complaint  Patient presents with  . Hypertension  . Hyperlipidemia    HPI  Hyperlipidemia - doing well, compliant with medications.  She sees Dr. [name] (Cardiology) about every 3 months.      Past Medical History:  Diagnosis Date  . Anemia   . Anticoagulated on Coumadin    CHRONIC  . Arthritis   . Back pain, chronic   . Chronic atrial fibrillation   . Coronary artery disease CARDIOLOGIST- DR [name]--  LOV IN EPIC  . Depression   . Hammer toe   . Hypercholesterolemia   . Hypertension   . Hypertensive heart disease    WITH LVH  . LVH (left ventricular hypertrophy) due to hypertensive disease   . Mitral insufficiency    CHRONIC  . Pulmonary hypertension ([place])   . Transfusion history      Family History  Problem Relation Age of Onset  . Heart disease Mother 73  . Prostate cancer Father 53     Current Outpatient Medications:  .  albuterol (PROVENTIL HFA;VENTOLIN HFA) 108 (90 Base) MCG/ACT inhaler, Inhale 2 puffs into the lungs every 6 (six) hours as needed for wheezing or shortness of breath., Disp: 1 Inhaler, Rfl: 2 .  Ascorbic Acid (VITAMIN C) 1000 MG tablet, Take 1,000 mg by mouth daily.  , Disp: , Rfl:  .  atorvastatin (LIPITOR) 20 MG tablet, Take 1 tablet (20 mg total) by mouth daily., Disp: 90 tablet, Rfl: 1 .  Biotin 1 MG CAPS, Take 1 tablet by mouth daily., Disp: , Rfl:  .  Calcium Carbonate-Vitamin D (CALCIUM + D PO), Take by mouth daily.  , Disp: , Rfl:  .  Cholecalciferol (VITAMIN D PO), Take by mouth daily.  , Disp: , Rfl:  .  Cyanocobalamin (VITAMIN B-12 PO), Take by mouth as directed., Disp: , Rfl:  .  diltiazem (CARDIZEM CD) 120 MG 24 hr capsule, TAKE 1 CAPSULE (120 MG TOTAL) BY MOUTH DAILY., Disp: 30 capsule, Rfl: 0 .  diltiazem (TIAZAC) 120 MG 24 hr capsule, TAKE ONE CAPSULE BY MOUTH EVERY DAY, Disp: 30 capsule, Rfl: 6 .  furosemide  (LASIX) 40 MG tablet, TAKE 1/2 TABLET BY MOUTH DAILY, Disp: 30 tablet, Rfl: 4 .  gabapentin (NEURONTIN) 300 MG capsule, Take 1 capsule by mouth daily., Disp: , Rfl:  .  HYDROcodone-acetaminophen (NORCO) 10-325 MG per tablet, Take 1 tablet by mouth every 6 (six) hours as needed., Disp: , Rfl:  .  KLOR-CON M20 20 MEQ tablet, TAKE 1 TABLET (20 MEQ TOTAL) BY MOUTH 2 (TWO) TIMES DAILY., Disp: 60 tablet, Rfl: 10 .  lisinopril (PRINIVIL,ZESTRIL) 20 MG tablet, Take 1 tablet (20 mg total) by mouth daily., Disp: 30 tablet, Rfl: 6 .  sertraline (ZOLOFT) 50 MG tablet, Take 1 tablet by mouth daily., Disp: , Rfl: 3 .  valACYclovir (VALTREX) 500 MG tablet, , Disp: , Rfl:  .  vitamin A 8000 UNIT capsule, Take 8,000 Units by mouth daily.  , Disp: , Rfl:  .  warfarin (COUMADIN) 5 MG tablet, TAKE 1/2 TO 1 TABLET BY MOUTH EVERY DAY AS DIRECTED BY COUMADIN CLINIC, Disp: 90 tablet, Rfl: 1 .  estrogens, conjugated, (PREMARIN) 1.25 MG tablet, Take 1.25 mg by mouth daily.  , Disp: , Rfl:    Allergies  Allergen Reactions  . Codeine Nausea And Vomiting  .  Sulfanilamide   . Penicillins Rash     Review of Systems  Constitutional: Negative.   HENT: Negative.   Respiratory: Negative.   Cardiovascular: Negative.   Gastrointestinal: Negative.   Genitourinary: Negative.   Musculoskeletal: Negative.      Today's Vitals   09/06/18 0939  BP: 138/80  Pulse: 88  Temp: 97.7 F (36.5 C)  TempSrc: Oral  SpO2: 96%  Weight: 164 lb 12.8 oz (74.8 kg)  Height: _0  (1.575 m)  PainSc: 0-No pain   Body mass index is 30.14 kg/m.   Objective:  Physical Exam  Constitutional: She is oriented to person, place, and time. She appears well-developed and well-nourished.  HENT:  Head: Normocephalic.  Cardiovascular: Normal rate.  Pulmonary/Chest: Effort normal and breath sounds normal.  Neurological: She is alert and oriented to person, place, and time.        Assessment And Plan:     1. Hyperlipidemia, unspecified  hyperlipidemia type  Chronic, controlled  Continue with current medications  2. Atrial fibrillation, unspecified type ([place])  Chronic, controlled  Continue with current medications  Continue with follow up at Cardiologist  3. Prediabetes  Chronic, stable  No current medications - BMP8+eGFR - Hemoglobin A1c  4. Essential hypertension Chronic, good control Continue with current medications     Minette Brine, FNP

## 2018-09-07 LAB — BMP8+EGFR
BUN / CREAT RATIO: 14 (ref 12–28)
BUN: 15 mg/dL (ref 8–27)
CHLORIDE: 100 mmol/L (ref 96–106)
CO2: 25 mmol/L (ref 20–29)
Calcium: 11.2 mg/dL — ABNORMAL HIGH (ref 8.7–10.3)
Creatinine, Ser: 1.06 mg/dL — ABNORMAL HIGH (ref 0.57–1.00)
GFR calc Af Amer: 56 mL/min/{1.73_m2} — ABNORMAL LOW (ref >59)
GFR calc non Af Amer: 48 mL/min/{1.73_m2} — ABNORMAL LOW (ref >59)
GLUCOSE: 106 mg/dL — AB (ref 65–99)
Potassium: 4.8 mmol/L (ref 3.5–5.2)
SODIUM: 141 mmol/L (ref 134–144)

## 2018-09-07 LAB — HEMOGLOBIN A1C
Est. average glucose Bld gHb Est-mCnc: 123 mg/dL
HEMOGLOBIN A1C: 5.9 % — AB (ref 4.8–5.6)

## 2018-09-11 DIAGNOSIS — L853 Xerosis cutis: Secondary | ICD-10-CM | POA: Diagnosis not present

## 2018-09-11 DIAGNOSIS — L299 Pruritus, unspecified: Secondary | ICD-10-CM | POA: Diagnosis not present

## 2018-09-11 DIAGNOSIS — I8310 Varicose veins of unspecified lower extremity with inflammation: Secondary | ICD-10-CM | POA: Diagnosis not present

## 2018-09-11 LAB — PROTEIN ELECTROPHORESIS, SERUM, WITH REFLEX
A/G Ratio: 1 (ref 0.7–1.7)
ALBUMIN ELP: 3.5 g/dL (ref 2.9–4.4)
ALPHA 1: 0.2 g/dL (ref 0.0–0.4)
Alpha 2: 1 g/dL (ref 0.4–1.0)
Beta: 1.6 g/dL — ABNORMAL HIGH (ref 0.7–1.3)
GAMMA GLOBULIN: 0.8 g/dL (ref 0.4–1.8)
Globulin, Total: 3.6 g/dL (ref 2.2–3.9)
TOTAL PROTEIN: 7.1 g/dL (ref 6.0–8.5)

## 2018-09-11 LAB — SPECIMEN STATUS REPORT

## 2018-09-18 ENCOUNTER — Telehealth: Payer: Self-pay

## 2018-09-24 ENCOUNTER — Telehealth: Payer: Self-pay

## 2018-09-24 NOTE — Telephone Encounter (Signed)
Patient called stating she had some questions about her labs and would like a call back. Patient's call was returned and we answered her questions that she had. YRL,RMA

## 2018-09-25 ENCOUNTER — Encounter: Payer: Self-pay | Admitting: Nurse Practitioner

## 2018-09-25 DIAGNOSIS — R7303 Prediabetes: Secondary | ICD-10-CM | POA: Insufficient documentation

## 2018-09-25 NOTE — Progress Notes (Signed)
[name] Date of Birth: [birth year]   History of Present Illness: [name] is seen for follow up Afib. She has a history of chronic atrial fibrillation, hypertensive heart disease, and pulmonary hypertension. She has a history of chronic back problems. She has moderate MR and COPD. In October she developed progressive weakness in her legs. We held her lipitor without improvement. MRI was done. This showed some chronic fatty atrophy of the muscles c/w denervation in the peroneal nerve distribution. We had discussed switching to a DOAC but she could not afford it.   On follow up today she is doing pretty well. She walks around with a walker. Denies any increase in SOB. No chest pain. Notes some swelling in her left ankle at times.   She did have a SSCA removed from her left leg earlier this year and it is slowly healing. Up to date on flu shot.   Current Outpatient Medications on File Prior to Visit  Medication Sig Dispense Refill  . albuterol (PROVENTIL HFA;VENTOLIN HFA) 108 (90 Base) MCG/ACT inhaler Inhale 2 puffs into the lungs every 6 (six) hours as needed for wheezing or shortness of breath. 1 Inhaler 2  . Ascorbic Acid (VITAMIN C) 1000 MG tablet Take 1,000 mg by mouth daily.      Marland Kitchen atorvastatin (LIPITOR) 20 MG tablet Take 1 tablet (20 mg total) by mouth daily. 90 tablet 1  . Biotin 1 MG CAPS Take 1 tablet by mouth daily.    . Calcium Carbonate-Vitamin D (CALCIUM + D PO) Take by mouth daily.      . Cholecalciferol (VITAMIN D PO) Take by mouth daily.      . Cyanocobalamin (VITAMIN B-12 PO) Take by mouth as directed.    . diltiazem (TIAZAC) 120 MG 24 hr capsule TAKE ONE CAPSULE BY MOUTH EVERY DAY 30 capsule 6  . estrogens, conjugated, (PREMARIN) 1.25 MG tablet Take 1.25 mg by mouth daily.      . furosemide (LASIX) 40 MG tablet TAKE 1/2 TABLET BY MOUTH DAILY 30 tablet 4  . gabapentin (NEURONTIN) 300 MG capsule Take 1 capsule by mouth daily.    Marland Kitchen HYDROcodone-acetaminophen  (NORCO) 10-325 MG per tablet Take 1 tablet by mouth every 6 (six) hours as needed.    Marland Kitchen KLOR-CON M20 20 MEQ tablet TAKE 1 TABLET (20 MEQ TOTAL) BY MOUTH 2 (TWO) TIMES DAILY. 60 tablet 10  . lisinopril (PRINIVIL,ZESTRIL) 20 MG tablet Take 1 tablet (20 mg total) by mouth daily. 30 tablet 6  . sertraline (ZOLOFT) 50 MG tablet Take 1 tablet by mouth daily.  3  . valACYclovir (VALTREX) 500 MG tablet     . vitamin A 8000 UNIT capsule Take 8,000 Units by mouth daily.      Marland Kitchen warfarin (COUMADIN) 5 MG tablet TAKE 1/2 TO 1 TABLET BY MOUTH EVERY DAY AS DIRECTED BY COUMADIN CLINIC 90 tablet 1  . [DISCONTINUED] diltiazem (CARDIZEM) 120 MG tablet Take 1 tablet (120 mg total) by mouth daily. 30 tablet 5   No current facility-administered medications on file prior to visit.     Allergies  Allergen Reactions  . Codeine Nausea And Vomiting  . Sulfanilamide   . Penicillins Rash    Past Medical History:  Diagnosis Date  . Anemia   . Anticoagulated on Coumadin    CHRONIC  . Arthritis   . Back pain, chronic   . Chronic atrial fibrillation   . Coronary artery disease CARDIOLOGIST- DR [name]--  LOV IN EPIC  .  Depression   . Hammer toe   . Hypercholesterolemia   . Hypertension   . Hypertensive heart disease    WITH LVH  . LVH (left ventricular hypertrophy) due to hypertensive disease   . Mitral insufficiency    CHRONIC  . Pulmonary hypertension ([place])   . Transfusion history     Past Surgical History:  Procedure Laterality Date  . ABDOMINAL HYSTERECTOMY    . ANTERIOR REPAIR/ [place] PUBOVAGINAL SLING  05-03-2002  . BUNIONECTOMY WITH HAMMERTOE RECONSTRUCTION Right 03/01/2013   Procedure: RIGHT FOOT EXCISION BUNIONETTE AND FUSION OF DIP FOURTH TOE;  Surgeon: [name], MD;  Location: [facility];  Service: Orthopedics;  Laterality: Right;  . CARDIAC CATHETERIZATION  02-03-2009  DR  [name]   SINGLE-VESSEL OBSTRUCTIVE CAD, SMALL DIAGONAL BRANCH/ NORMAL LVF/ SEVERE MITRAL  INSUFFICIENCY/ SEVERE PULMONARY HYPERTENSION  . CATARACT EXTRACTION W/ INTRAOCULAR LENS  IMPLANT, BILATERAL  2003  . COLONOSCOPY  07/25/2014   few mall scattered diverticula in the ectire examined colon. Small internal hemmorhoids.   Marland Kitchen HAMMER TOE SURGERY  02/28/2012   Procedure: HAMMER TOE CORRECTION;  Surgeon: [name], MD;  Location: [facility];  Service: Orthopedics;  Laterality: Right;  FUSION OF PROXIMAL INTERPHALANGEAL  RIGHT THIRD CLAW TOE  . PARS PLANA VITRECTOMY W/ REPAIR OF MACULAR HOLE  02-04-2002   LEFT EYE  . REPAIR RIGHT SECOND CLAW TOE/ VARUS MEDIAL CLOSING WEDGE OSTEOTOMY PROXIMAL PHALANX RIGHT GREAT TOE  05-05-2005  . TOTAL HIP ARTHROPLASTY     BILATERAL---  LEFT 4/98;   RIGHT  11/98  . VARUS OSTEOTOMY PROXIMAL PHALANX, LEFT GREAT TOE/ PARING DOWN THE CALLUS , SECOND LEFT TOE  10-27-2008    Social History   Tobacco Use  Smoking Status Never Smoker  Smokeless Tobacco Never Used    Social History   Substance and Sexual Activity  Alcohol Use Yes   Comment: occasional    Family History  Problem Relation Age of Onset  . Heart disease Mother 66  . Prostate cancer Father 46    Review of Systems:  As noted in history of present illness.  All other systems were reviewed and are negative.  Physical Exam: BP (!) 144/84   Pulse 79   Ht 5\' 4"  (1.626 m)   Wt 163 lb 3.2 oz (74 kg)   SpO2 95%   BMI 28.01 kg/m  GENERAL:  Well appearing WF in NAD HEENT:  PERRL, EOMI, sclera are clear. Oropharynx is clear. NECK:  No jugular venous distention, carotid upstroke brisk and symmetric, no bruits, no thyromegaly or adenopathy LUNGS:  Clear to auscultation bilaterally CHEST:  Unremarkable HEART:  RRR,  PMI not displaced or sustained,S1 and S2 within normal limits, no S3, no S4: no clicks, no rubs, gr 7-3/4 systolic murmur apex.  ABD:  Soft, nontender. BS +, no masses or bruits. No hepatomegaly, no splenomegaly EXT:  2 + pulses throughout, no edema,  venous varicosities.  SKIN:  Warm and dry.  No rashes NEURO:  Alert and oriented x 3. Cranial nerves II through XII intact. PSYCH:  Cognitively intact     LABORATORY DATA: INR today is 2.2  Lab Results  Component Value Date   WBC 4.6 03/27/2018   HGB 12.6 03/27/2018   HCT 38.6 03/27/2018   PLT 356 03/27/2018   GLUCOSE 106 (H) 09/06/2018   CHOL 185 04/10/2018   TRIG 171 (H) 04/10/2018   HDL 57 04/10/2018   LDLCALC 94 04/10/2018   ALT  18 04/10/2018   AST 23 04/10/2018   NA 141 09/06/2018   K 4.8 09/06/2018   CL 100 09/06/2018   CREATININE 1.06 (H) 09/06/2018   BUN 15 09/06/2018   CO2 25 09/06/2018   TSH 1.170 03/26/2014   INR 2.2 09/27/2018   HGBA1C 5.9 (H) 09/06/2018    Labs reviewed from 02/24/17: cholesterol 174, triglycerides 110, LDL 90, HDL 62. A1c 6.0%. CMET and TSH normal. Dated 08/24/17: cholesterol 157, triglycerides 106, HDL 55, LDL 90. A1c 6.1%. CMET and CBC normal  Assessment / Plan: 1. Atrial fibrillation, permanent. Rate is well controlled on diltiazem. She is anticoagulated on Coumadin. INR therapeutic.  2. Mild to Moderate MR. Last Echo 8/16. Asymptomatic. Exam is unchanged.   3. Hypertension with hypertensive heart disease, BP well controlled.  4. Hyperlipidemia on statin therapy. Last LDL 94  5. Pulmonary hypertension-moderate  6. Coronary disease. Cardiac catheterization in 2010 showed severe stenosis in a small diagonal branch. She otherwise had no significant disease. She is asymptomatic.   7. Chronic bronchitis and COPD. Currently well controlled.   Follow up in 6 months.

## 2018-09-27 ENCOUNTER — Ambulatory Visit (INDEPENDENT_AMBULATORY_CARE_PROVIDER_SITE_OTHER): Payer: Medicare Other | Admitting: Cardiology

## 2018-09-27 ENCOUNTER — Encounter: Payer: Self-pay | Admitting: Cardiology

## 2018-09-27 ENCOUNTER — Ambulatory Visit (INDEPENDENT_AMBULATORY_CARE_PROVIDER_SITE_OTHER): Payer: Medicare Other | Admitting: Pharmacist Clinician (PhC)/ Clinical Pharmacy Specialist

## 2018-09-27 VITALS — BP 144/84 | HR 79 | Ht 64.0 in | Wt 163.2 lb

## 2018-09-27 DIAGNOSIS — Z5181 Encounter for therapeutic drug level monitoring: Secondary | ICD-10-CM

## 2018-09-27 DIAGNOSIS — Z7901 Long term (current) use of anticoagulants: Secondary | ICD-10-CM

## 2018-09-27 DIAGNOSIS — I10 Essential (primary) hypertension: Secondary | ICD-10-CM | POA: Diagnosis not present

## 2018-09-27 DIAGNOSIS — I482 Chronic atrial fibrillation, unspecified: Secondary | ICD-10-CM | POA: Diagnosis not present

## 2018-09-27 DIAGNOSIS — I4891 Unspecified atrial fibrillation: Secondary | ICD-10-CM

## 2018-09-27 LAB — POCT INR: INR: 2.2 (ref 2.0–3.0)

## 2018-10-26 ENCOUNTER — Ambulatory Visit (INDEPENDENT_AMBULATORY_CARE_PROVIDER_SITE_OTHER): Payer: Medicare Other | Admitting: Pharmacist

## 2018-10-26 DIAGNOSIS — I482 Chronic atrial fibrillation, unspecified: Secondary | ICD-10-CM | POA: Diagnosis not present

## 2018-10-26 DIAGNOSIS — Z7901 Long term (current) use of anticoagulants: Secondary | ICD-10-CM | POA: Diagnosis not present

## 2018-10-26 DIAGNOSIS — Z79899 Other long term (current) drug therapy: Secondary | ICD-10-CM | POA: Diagnosis not present

## 2018-10-26 DIAGNOSIS — Z79891 Long term (current) use of opiate analgesic: Secondary | ICD-10-CM | POA: Diagnosis not present

## 2018-10-26 DIAGNOSIS — I4891 Unspecified atrial fibrillation: Secondary | ICD-10-CM | POA: Diagnosis not present

## 2018-10-26 DIAGNOSIS — Z5181 Encounter for therapeutic drug level monitoring: Secondary | ICD-10-CM | POA: Diagnosis not present

## 2018-10-26 DIAGNOSIS — Z1231 Encounter for screening mammogram for malignant neoplasm of breast: Secondary | ICD-10-CM | POA: Diagnosis not present

## 2018-10-26 DIAGNOSIS — G894 Chronic pain syndrome: Secondary | ICD-10-CM | POA: Diagnosis not present

## 2018-10-26 DIAGNOSIS — M545 Low back pain: Secondary | ICD-10-CM | POA: Diagnosis not present

## 2018-10-26 LAB — POCT INR: INR: 3.8 — AB (ref 2.0–3.0)

## 2018-11-15 DIAGNOSIS — Z5181 Encounter for therapeutic drug level monitoring: Secondary | ICD-10-CM | POA: Diagnosis not present

## 2018-11-15 DIAGNOSIS — Z79899 Other long term (current) drug therapy: Secondary | ICD-10-CM | POA: Diagnosis not present

## 2018-11-22 ENCOUNTER — Ambulatory Visit (INDEPENDENT_AMBULATORY_CARE_PROVIDER_SITE_OTHER): Payer: Medicare Other | Admitting: Pharmacist

## 2018-11-22 DIAGNOSIS — I4891 Unspecified atrial fibrillation: Secondary | ICD-10-CM | POA: Diagnosis not present

## 2018-11-22 DIAGNOSIS — I482 Chronic atrial fibrillation, unspecified: Secondary | ICD-10-CM | POA: Diagnosis not present

## 2018-11-22 DIAGNOSIS — Z5181 Encounter for therapeutic drug level monitoring: Secondary | ICD-10-CM | POA: Diagnosis not present

## 2018-11-22 DIAGNOSIS — Z7901 Long term (current) use of anticoagulants: Secondary | ICD-10-CM

## 2018-11-22 LAB — POCT INR: INR: 2.4 (ref 2.0–3.0)

## 2018-12-03 ENCOUNTER — Other Ambulatory Visit: Payer: Self-pay | Admitting: Cardiology

## 2018-12-11 ENCOUNTER — Other Ambulatory Visit: Payer: Self-pay | Admitting: Cardiology

## 2018-12-11 MED ORDER — FUROSEMIDE 40 MG PO TABS: 20.0000 mg | ORAL_TABLET | Freq: Every day | ORAL | 1 refills | Status: DC

## 2018-12-11 NOTE — Telephone Encounter (Signed)
 *  STAT* If patient is at the pharmacy, call can be transferred to refill team.   1. Which medications need to be refilled? (please list name of each medication and dose if known) furosemide (LASIX) 40 MG tablet  2. Which pharmacy/location (including street and city if local pharmacy) is medication to be sent to? [facility]  3. Do they need a 30 day or 90 day supply? [place]

## 2018-12-16 ENCOUNTER — Other Ambulatory Visit: Payer: Self-pay | Admitting: Cardiology

## 2018-12-27 ENCOUNTER — Ambulatory Visit: Payer: Medicare Other | Admitting: Nurse Practitioner

## 2019-01-03 ENCOUNTER — Other Ambulatory Visit: Payer: Self-pay | Admitting: Cardiology

## 2019-01-03 ENCOUNTER — Ambulatory Visit (INDEPENDENT_AMBULATORY_CARE_PROVIDER_SITE_OTHER): Payer: Medicare Other | Admitting: Nurse Practitioner

## 2019-01-03 ENCOUNTER — Ambulatory Visit (INDEPENDENT_AMBULATORY_CARE_PROVIDER_SITE_OTHER): Payer: Medicare Other | Admitting: Pharmacist Clinician (PhC)/ Clinical Pharmacy Specialist

## 2019-01-03 ENCOUNTER — Encounter: Payer: Self-pay | Admitting: Nurse Practitioner

## 2019-01-03 VITALS — BP 128/76 | HR 103 | Temp 98.1°F | Wt 161.2 lb

## 2019-01-03 DIAGNOSIS — I4891 Unspecified atrial fibrillation: Secondary | ICD-10-CM | POA: Diagnosis not present

## 2019-01-03 DIAGNOSIS — R7303 Prediabetes: Secondary | ICD-10-CM

## 2019-01-03 DIAGNOSIS — E785 Hyperlipidemia, unspecified: Secondary | ICD-10-CM

## 2019-01-03 DIAGNOSIS — I482 Chronic atrial fibrillation, unspecified: Secondary | ICD-10-CM | POA: Diagnosis not present

## 2019-01-03 DIAGNOSIS — I10 Essential (primary) hypertension: Secondary | ICD-10-CM | POA: Diagnosis not present

## 2019-01-03 DIAGNOSIS — Z7901 Long term (current) use of anticoagulants: Secondary | ICD-10-CM | POA: Diagnosis not present

## 2019-01-03 DIAGNOSIS — Z5181 Encounter for therapeutic drug level monitoring: Secondary | ICD-10-CM

## 2019-01-03 LAB — POCT INR: INR: 3 (ref 2.0–3.0)

## 2019-01-03 NOTE — Telephone Encounter (Signed)
Rx(s) sent to pharmacy electronically.  

## 2019-01-03 NOTE — Progress Notes (Addendum)
Subjective:     Patient ID: [name] , female    DOB: 1932/05/16 , 85 y.o.   MRN: [id number]   Chief Complaint  Patient presents with  . Diabetes    HPI  Diabetes  She presents for her follow-up diabetic visit. Diabetes type: prediabetes. Pertinent negatives for hypoglycemia include no confusion, dizziness, headaches or nervousness/anxiousness. There are no diabetic associated symptoms. Pertinent negatives for diabetes include no chest pain, no fatigue and no weakness. There are no hypoglycemic complications. Symptoms are stable. There are no diabetic complications. She is compliant with treatment all of the time.     Past Medical History:  Diagnosis Date  . Anemia   . Anticoagulated on Coumadin    CHRONIC  . Arthritis   . Back pain, chronic   . Chronic atrial fibrillation   . Coronary artery disease CARDIOLOGIST- DR [name]--  LOV IN EPIC  . Depression   . Hammer toe   . Hypercholesterolemia   . Hypertension   . Hypertensive heart disease    WITH LVH  . LVH (left ventricular hypertrophy) due to hypertensive disease   . Mitral insufficiency    CHRONIC  . Pulmonary hypertension ([place])   . Transfusion history      Family History  Problem Relation Age of Onset  . Heart disease Mother 33  . Prostate cancer Father 73     Current Outpatient Medications:  .  Ascorbic Acid (VITAMIN C) 1000 MG tablet, Take 1,000 mg by mouth daily.  , Disp: , Rfl:  .  atorvastatin (LIPITOR) 20 MG tablet, Take 1 tablet (20 mg total) by mouth daily., Disp: 90 tablet, Rfl: 1 .  Biotin 1 MG CAPS, Take 1 tablet by mouth daily., Disp: , Rfl:  .  Calcium Carbonate-Vitamin D (CALCIUM + D PO), Take by mouth daily.  , Disp: , Rfl:  .  Cholecalciferol (VITAMIN D PO), Take by mouth daily.  , Disp: , Rfl:  .  Cyanocobalamin (VITAMIN B-12 PO), Take by mouth as directed., Disp: , Rfl:  .  diltiazem (TIAZAC) 120 MG 24 hr capsule, TAKE ONE CAPSULE BY MOUTH EVERY DAY, Disp: 30 capsule, Rfl: 3 .   estrogens, conjugated, (PREMARIN) 1.25 MG tablet, Take 1.25 mg by mouth daily.  , Disp: , Rfl:  .  furosemide (LASIX) 40 MG tablet, Take 0.5 tablets (20 mg total) by mouth daily., Disp: 45 tablet, Rfl: 1 .  gabapentin (NEURONTIN) 300 MG capsule, Take 1 capsule by mouth daily., Disp: , Rfl:  .  HYDROcodone-acetaminophen (NORCO) 10-325 MG per tablet, Take 1 tablet by mouth every 6 (six) hours as needed., Disp: , Rfl:  .  KLOR-CON M20 20 MEQ tablet, TAKE 1 TABLET (20 MEQ TOTAL) BY MOUTH 2 (TWO) TIMES DAILY., Disp: 60 tablet, Rfl: 10 .  lisinopril (PRINIVIL,ZESTRIL) 20 MG tablet, Take 1 tablet (20 mg total) by mouth daily., Disp: 30 tablet, Rfl: 6 .  sertraline (ZOLOFT) 50 MG tablet, Take 1 tablet by mouth daily., Disp: , Rfl: 3 .  valACYclovir (VALTREX) 500 MG tablet, , Disp: , Rfl:  .  vitamin A 8000 UNIT capsule, Take 8,000 Units by mouth daily.  , Disp: , Rfl:  .  warfarin (COUMADIN) 5 MG tablet, TAKE 1/2 TO 1 TABLET BY MOUTH EVERY DAY AS DIRECTED BY COUMADIN CLINIC, Disp: 90 tablet, Rfl: 1 .  albuterol (PROVENTIL HFA;VENTOLIN HFA) 108 (90 Base) MCG/ACT inhaler, Inhale 2 puffs into the lungs every 6 (six) hours as needed for wheezing or  shortness of breath. (Patient not taking: Reported on 01/03/2019), Disp: 1 Inhaler, Rfl: 2   Allergies  Allergen Reactions  . Codeine Nausea And Vomiting  . Sulfanilamide   . Penicillins Rash     Review of Systems  Constitutional: Negative.  Negative for fatigue.  Eyes: Negative for visual disturbance.  Respiratory: Negative.  Negative for shortness of breath.   Cardiovascular: Negative.  Negative for chest pain, palpitations and leg swelling.  Gastrointestinal: Negative.   Endocrine: Negative.   Musculoskeletal: Negative.   Skin: Negative.   Neurological: Negative for dizziness, weakness and headaches.  Psychiatric/Behavioral: Negative for confusion. The patient is not nervous/anxious.      Today's Vitals   01/03/19 1127  BP: 128/76  Pulse: (!) 103   Temp: 98.1 F (36.7 C)  SpO2: 96%  Weight: 161 lb 3.2 oz (73.1 kg)   Body mass index is 27.67 kg/m.   Objective:  Physical Exam Vitals signs reviewed.  Constitutional:      Appearance: She is well-developed.  HENT:     Head: Normocephalic and atraumatic.  Eyes:     Pupils: Pupils are equal, round, and reactive to light.  Cardiovascular:     Rate and Rhythm: Normal rate and regular rhythm.     Pulses: Normal pulses.     Heart sounds: Normal heart sounds. No murmur.  Pulmonary:     Effort: Pulmonary effort is normal.     Breath sounds: Normal breath sounds.  Musculoskeletal: Normal range of motion.  Skin:    General: Skin is warm and dry.     Capillary Refill: Capillary refill takes less than 2 seconds.  Neurological:     General: No focal deficit present.     Mental Status: She is alert and oriented to person, place, and time.     Cranial Nerves: No cranial nerve deficit.  Psychiatric:        Mood and Affect: Mood normal.         Assessment And Plan:   1. Prediabetes  Chronic, good control  No current medications  Encouraged to limit intake of sugary foods and drinks - Hemoglobin A1c  2. Essential hypertension . B/P is controlled.  . CMP ordered to check renal function.  Marland Kitchen No changes with medications - Hemoglobin A1c - CMP14 + Anion Gap  3. Serum calcium elevated  Will check parathyroid hormone pending results will refer to hematology - Parathyroid Hormone, Intact w/Ca  4. Hyperlipidemia, unspecified hyperlipidemia type  Chronic, controlled  Continue with current medications  Explained even though her cholesterol is normal, she is on medications for this.  - Lipid Profile    Minette Brine, FNP

## 2019-01-04 LAB — CMP14 + ANION GAP
ALT: 27 IU/L (ref 0–32)
AST: 28 IU/L (ref 0–40)
Albumin/Globulin Ratio: 1.4 (ref 1.2–2.2)
Albumin: 4.2 g/dL (ref 3.6–4.6)
Alkaline Phosphatase: 69 IU/L (ref 39–117)
Anion Gap: 13 mmol/L (ref 10.0–18.0)
BUN/Creatinine Ratio: 12 (ref 12–28)
BUN: 14 mg/dL (ref 8–27)
Bilirubin Total: 0.4 mg/dL (ref 0.0–1.2)
CO2: 24 mmol/L (ref 20–29)
Calcium: 10.5 mg/dL — ABNORMAL HIGH (ref 8.7–10.3)
Chloride: 107 mmol/L — ABNORMAL HIGH (ref 96–106)
Creatinine, Ser: 1.17 mg/dL — ABNORMAL HIGH (ref 0.57–1.00)
GFR calc Af Amer: 49 mL/min/{1.73_m2} — ABNORMAL LOW (ref >59)
GFR calc non Af Amer: 43 mL/min/{1.73_m2} — ABNORMAL LOW (ref >59)
Globulin, Total: 3.1 g/dL (ref 1.5–4.5)
Glucose: 102 mg/dL — ABNORMAL HIGH (ref 65–99)
Potassium: 4.6 mmol/L (ref 3.5–5.2)
Sodium: 144 mmol/L (ref 134–144)
Total Protein: 7.3 g/dL (ref 6.0–8.5)

## 2019-01-04 LAB — PTH, INTACT AND CALCIUM: PTH: 25 pg/mL (ref 15–65)

## 2019-01-04 LAB — LIPID PANEL
Chol/HDL Ratio: 3.2 ratio (ref 0.0–4.4)
Cholesterol, Total: 167 mg/dL (ref 100–199)
HDL: 52 mg/dL (ref >39)
LDL Calculated: 77 mg/dL (ref 0–99)
Triglycerides: 188 mg/dL — ABNORMAL HIGH (ref 0–149)
VLDL Cholesterol Cal: 38 mg/dL (ref 5–40)

## 2019-01-04 LAB — HEMOGLOBIN A1C
ESTIMATED AVERAGE GLUCOSE: 128 mg/dL
Hgb A1c MFr Bld: 6.1 % — ABNORMAL HIGH (ref 4.8–5.6)

## 2019-01-08 ENCOUNTER — Telehealth: Payer: Self-pay

## 2019-01-09 DIAGNOSIS — C44519 Basal cell carcinoma of skin of other part of trunk: Secondary | ICD-10-CM | POA: Diagnosis not present

## 2019-01-09 NOTE — Telephone Encounter (Signed)
error 

## 2019-01-22 ENCOUNTER — Telehealth: Payer: Self-pay | Admitting: Cardiology

## 2019-01-22 NOTE — Telephone Encounter (Signed)
Patient needs to ask you a couple of questions about what is going on.

## 2019-01-22 NOTE — Telephone Encounter (Signed)
Attempted to contact pt. Pt states she would prefer to speak with Malachy Mood. Will route to nurse.

## 2019-01-23 NOTE — Telephone Encounter (Signed)
Returned call to patient no answer.LMTC. 

## 2019-01-24 NOTE — Telephone Encounter (Signed)
Follow up   Pt is returning phone call  She will be home the rest of the day Please call back

## 2019-01-25 NOTE — Telephone Encounter (Signed)
Spoke to patient she stated she is doing good.She wanted to know if she needs to keep appointment with Dr.Jordan 03/28/19.Advised to keep appointment as planned unless she hears from office.Advised to call sooner if needed.

## 2019-02-06 ENCOUNTER — Telehealth: Payer: Self-pay | Admitting: Cardiology

## 2019-02-06 NOTE — Telephone Encounter (Signed)
New message   Patient states that she is returning your call. Please call.

## 2019-02-06 NOTE — Telephone Encounter (Signed)
Returned call to patient.she stated she had recent lab work done at PCP.Stated she would like Dr.Jordan to review.Advised I will request lab to be faxed.I will show Dr.Jordan tomorrow.

## 2019-02-11 NOTE — Telephone Encounter (Signed)
Returned call to patient no answer.Left message on personal voice mail Dr.Jordan reviewed 01/03/19 lab from PCP.He advised continue same medications.Advised decrease white foods.

## 2019-02-14 ENCOUNTER — Telehealth: Payer: Self-pay | Admitting: Nurse Practitioner

## 2019-02-14 NOTE — Telephone Encounter (Signed)
Called to schedule appt for AWV with [facility] on 05/08/2019.

## 2019-02-15 ENCOUNTER — Telehealth: Payer: Self-pay | Admitting: Cardiology

## 2019-02-15 NOTE — Telephone Encounter (Signed)
New message:   Patient states some one called her and is returing the call back. Patient aso so have some other question.

## 2019-02-15 NOTE — Telephone Encounter (Signed)
Returned call.  Patient noted that she had severe case of diarrhea yesterday and was improving somewhat today, but still feeling weak and washed out.  Advised that she take only 1/2 dose of warfarin today.  Patient reports she has already taken today's dose (5 mg each M/F, 2.5 mg all other days).  Advised she hold Saturday dose and then continue as prescribed. Keep appointment set for April 16.  Patient voiced understanding

## 2019-02-15 NOTE — Telephone Encounter (Signed)
New Message             Patient is needing a call back, she has a questioin for "Raquel"[email or web address]"

## 2019-02-15 NOTE — Telephone Encounter (Signed)
Pt is wanting to speak with [name] or [name] Will forward message to both .

## 2019-02-15 NOTE — Telephone Encounter (Signed)
Patient needed confirmation of address of [facility] office for coumadin clinic

## 2019-02-18 ENCOUNTER — Telehealth: Payer: Self-pay

## 2019-02-18 ENCOUNTER — Telehealth: Payer: Self-pay | Admitting: Cardiology

## 2019-02-18 NOTE — Telephone Encounter (Signed)
Patient confused about appointment time. Will have her go to [place] site tomorrow 10:45 am.  Patient voiced understanding

## 2019-02-18 NOTE — Telephone Encounter (Signed)

## 2019-02-18 NOTE — Telephone Encounter (Signed)
°  Patient requesting to speak with Coumadin clinic

## 2019-02-19 ENCOUNTER — Ambulatory Visit (INDEPENDENT_AMBULATORY_CARE_PROVIDER_SITE_OTHER): Payer: Medicare Other | Admitting: *Deleted

## 2019-02-19 ENCOUNTER — Other Ambulatory Visit: Payer: Self-pay

## 2019-02-19 ENCOUNTER — Telehealth: Payer: Self-pay | Admitting: Cardiology

## 2019-02-19 DIAGNOSIS — I4891 Unspecified atrial fibrillation: Secondary | ICD-10-CM | POA: Diagnosis not present

## 2019-02-19 DIAGNOSIS — I482 Chronic atrial fibrillation, unspecified: Secondary | ICD-10-CM | POA: Diagnosis not present

## 2019-02-19 DIAGNOSIS — Z7901 Long term (current) use of anticoagulants: Secondary | ICD-10-CM | POA: Diagnosis not present

## 2019-02-19 DIAGNOSIS — Z5181 Encounter for therapeutic drug level monitoring: Secondary | ICD-10-CM | POA: Diagnosis not present

## 2019-02-19 LAB — POCT INR: INR: 2.1 (ref 2.0–3.0)

## 2019-02-19 NOTE — Patient Instructions (Signed)
Description   Spoke with pt and instructed pt to continue taking 1 tablet each Monday, Wednesday and Friday, 1/2 tablet all other days. Repeat INR in 5 weeks with MD appt.

## 2019-02-19 NOTE — Telephone Encounter (Signed)
New Message   Patient would like you to call her back.

## 2019-02-19 NOTE — Telephone Encounter (Signed)
Talked to patient. She will be coming to [facility] for INR checks.  Next schedule for 5/21. Okay to change to [facility] drive up if needed.

## 2019-02-24 ENCOUNTER — Other Ambulatory Visit: Payer: Self-pay | Admitting: Nurse Practitioner

## 2019-02-24 DIAGNOSIS — E7800 Pure hypercholesterolemia, unspecified: Secondary | ICD-10-CM

## 2019-02-25 ENCOUNTER — Other Ambulatory Visit: Payer: Self-pay

## 2019-02-25 DIAGNOSIS — E7800 Pure hypercholesterolemia, unspecified: Secondary | ICD-10-CM

## 2019-02-25 MED ORDER — ATORVASTATIN CALCIUM 20 MG PO TABS: 20.0000 mg | ORAL_TABLET | Freq: Every day | ORAL | 1 refills | Status: DC

## 2019-02-26 ENCOUNTER — Telehealth: Payer: Self-pay

## 2019-02-26 NOTE — Telephone Encounter (Signed)
Spoke to patient advised I received a message you wanted me to call.She stated she wanted to check to see if she needs to keep appointment scheduled with Dr.Jordan 03/28/19.Stated she is doing well.Advised to keep for now this visit may be turned into a virtual visit.

## 2019-03-05 ENCOUNTER — Telehealth: Payer: Self-pay | Admitting: Cardiology

## 2019-03-05 NOTE — Telephone Encounter (Signed)
Pt called returning Cheryl's call.

## 2019-03-06 NOTE — Telephone Encounter (Signed)
Returned call to patient she wanted to know if it would be ok to take Azo bladder control medication.Advised I will send message to our pharmacist for advice.

## 2019-03-06 NOTE — Telephone Encounter (Signed)
Returned call to patient. AZO Bladder Control is a pumpkin seed and soy combination.  She should be fine to try it.  Patient aware.

## 2019-03-26 ENCOUNTER — Telehealth: Payer: Self-pay | Admitting: Cardiology

## 2019-03-26 ENCOUNTER — Telehealth: Payer: Self-pay

## 2019-03-26 NOTE — Telephone Encounter (Signed)
New Message    Pt Is calling and is wanting to have her labs done in [place]    Please call

## 2019-03-26 NOTE — Telephone Encounter (Signed)
Spoke to patient she stated she is going to have lab work done at [facility] in [place].Advised that is ok.

## 2019-03-26 NOTE — Telephone Encounter (Signed)

## 2019-03-26 NOTE — Progress Notes (Signed)
Virtual Visit via Telephone Note   This visit type was conducted due to national recommendations for restrictions regarding the COVID-19 Pandemic (e.g. social distancing) in an effort to limit this patient's exposure and mitigate transmission in our community.  Due to her co-morbid illnesses, this patient is at least at moderate risk for complications without adequate follow up.  This format is felt to be most appropriate for this patient at this time.  The patient did not have access to video technology/had technical difficulties with video requiring transitioning to audio format only (telephone).  All issues noted in this document were discussed and addressed.  No physical exam could be performed with this format.  Please refer to the patient's chart for her  consent to telehealth for [facility].   Date:  03/28/2019   ID:  [name], DOB 01/10/1932, MRN [id number]  Patient Location: Home Provider Location: Home  PCP:  Minette Brine, FNP  Cardiologist:  Dwayne Bulkley [name] MD Electrophysiologist:  None   Evaluation Performed:  Follow-Up Visit  Chief Complaint:  Follow up Afib  History of Present Illness:    [name] is a 85 y.o. female with She has a history of chronic atrial fibrillation, hypertensive heart disease, and pulmonary hypertension. She has a history of chronic back problems. She has moderate MR and COPD. In October she developed progressive weakness in her legs. We held her lipitor without improvement. MRI was done. This showed some chronic fatty atrophy of the muscles c/w denervation in the peroneal nerve distribution. We had discussed switching to a DOAC but she could not afford it. She does use a cane or walker.  She has done well keeping social isolation with the pandemic. She denies any increased SOB, palpitations, dizziness, or edema.   The patient does not have symptoms concerning for COVID-19 infection (fever, chills, cough, or new shortness of  breath).    Past Medical History:  Diagnosis Date  . Anemia   . Anticoagulated on Coumadin    CHRONIC  . Arthritis   . Back pain, chronic   . Chronic atrial fibrillation   . Coronary artery disease CARDIOLOGIST- DR [name]--  LOV IN EPIC  . Depression   . Hammer toe   . Hypercholesterolemia   . Hypertension   . Hypertensive heart disease    WITH LVH  . LVH (left ventricular hypertrophy) due to hypertensive disease   . Mitral insufficiency    CHRONIC  . Pulmonary hypertension ([place])   . Transfusion history    Past Surgical History:  Procedure Laterality Date  . ABDOMINAL HYSTERECTOMY    . ANTERIOR REPAIR/ [place] PUBOVAGINAL SLING  05-03-2002  . BUNIONECTOMY WITH HAMMERTOE RECONSTRUCTION Right 03/01/2013   Procedure: RIGHT FOOT EXCISION BUNIONETTE AND FUSION OF DIP FOURTH TOE;  Surgeon: [name], MD;  Location: [place];  Service: Orthopedics;  Laterality: Right;  . CARDIAC CATHETERIZATION  02-03-2009  DR  [name]   SINGLE-VESSEL OBSTRUCTIVE CAD, SMALL DIAGONAL BRANCH/ NORMAL LVF/ SEVERE MITRAL INSUFFICIENCY/ SEVERE PULMONARY HYPERTENSION  . CATARACT EXTRACTION W/ INTRAOCULAR LENS  IMPLANT, BILATERAL  2003  . COLONOSCOPY  07/25/2014   few mall scattered diverticula in the ectire examined colon. Small internal hemmorhoids.   Marland Kitchen HAMMER TOE SURGERY  02/28/2012   Procedure: HAMMER TOE CORRECTION;  Surgeon: [name], MD;  Location: [facility];  Service: Orthopedics;  Laterality: Right;  FUSION OF PROXIMAL INTERPHALANGEAL  RIGHT THIRD CLAW TOE  . PARS PLANA VITRECTOMY W/  REPAIR OF MACULAR HOLE  02-04-2002   LEFT EYE  . REPAIR RIGHT SECOND CLAW TOE/ VARUS MEDIAL CLOSING WEDGE OSTEOTOMY PROXIMAL PHALANX RIGHT GREAT TOE  05-05-2005  . TOTAL HIP ARTHROPLASTY     BILATERAL---  LEFT 4/98;   RIGHT  11/98  . VARUS OSTEOTOMY PROXIMAL PHALANX, LEFT GREAT TOE/ PARING DOWN THE CALLUS , SECOND LEFT TOE  10-27-2008     Current Meds  Medication Sig   . Ascorbic Acid (VITAMIN C) 1000 MG tablet Take 1,000 mg by mouth daily.    Marland Kitchen atorvastatin (LIPITOR) 20 MG tablet Take 1 tablet (20 mg total) by mouth daily.  . Biotin 1 MG CAPS Take 1 tablet by mouth daily.  . Calcium Carbonate-Vitamin D (CALCIUM + D PO) Take by mouth daily.    . Cholecalciferol (VITAMIN D PO) Take by mouth daily.    . Cyanocobalamin (VITAMIN B-12 PO) Take by mouth as directed.  . diltiazem (TIAZAC) 120 MG 24 hr capsule Take 1 capsule (120 mg total) by mouth daily.  Marland Kitchen estrogens, conjugated, (PREMARIN) 1.25 MG tablet Take 1.25 mg by mouth daily. Takes 3 x week Mon-Wed-Fri  . furosemide (LASIX) 40 MG tablet Take 0.5 tablets (20 mg total) by mouth daily.  Marland Kitchen gabapentin (NEURONTIN) 300 MG capsule Take 1 capsule by mouth daily.  Marland Kitchen HYDROcodone-acetaminophen (NORCO) 10-325 MG per tablet Take 1 tablet by mouth every 6 (six) hours as needed.  Marland Kitchen KLOR-CON M20 20 MEQ tablet TAKE 1 TABLET (20 MEQ TOTAL) BY MOUTH 2 (TWO) TIMES DAILY.  Marland Kitchen lisinopril (PRINIVIL,ZESTRIL) 20 MG tablet Take 1 tablet (20 mg total) by mouth daily.  . sertraline (ZOLOFT) 50 MG tablet Take 1&1/2 tablets daily  . valACYclovir (VALTREX) 500 MG tablet   . vitamin A 8000 UNIT capsule Take 8,000 Units by mouth daily.    Marland Kitchen warfarin (COUMADIN) 5 MG tablet TAKE 1/2 TO 1 TABLET BY MOUTH EVERY DAY AS DIRECTED BY COUMADIN CLINIC  . [DISCONTINUED] sertraline (ZOLOFT) 50 MG tablet Take 1 tablet by mouth daily.     Allergies:   Codeine; Sulfanilamide; and Penicillins   Social History   Tobacco Use  . Smoking status: Never Smoker  . Smokeless tobacco: Never Used  Substance Use Topics  . Alcohol use: Yes    Comment: occasional  . Drug use: No     Family Hx: The patient's family history includes Heart disease (age of onset: 40) in her mother; Prostate cancer (age of onset: 75) in her father.  ROS:   Please see the history of present illness.    All other systems reviewed and are negative.   Prior CV studies:   The  following studies were reviewed today:  none  Labs/Other Tests and Data Reviewed:    EKG:  No ECG reviewed.  Recent Labs: 01/03/2019: ALT 27; BUN 14; Creatinine, Ser 1.17; Potassium 4.6; Sodium 144   Recent Lipid Panel Lab Results  Component Value Date/Time   CHOL 167 01/03/2019 12:39 PM   TRIG 188 (H) 01/03/2019 12:39 PM   HDL 52 01/03/2019 12:39 PM   CHOLHDL 3.2 01/03/2019 12:39 PM   CHOLHDL 2 01/26/2012 09:24 AM   LDLCALC 77 01/03/2019 12:39 PM    Wt Readings from Last 3 Encounters:  03/28/19 164 lb (74.4 kg)  01/03/19 161 lb 3.2 oz (73.1 kg)  09/27/18 163 lb 3.2 oz (74 kg)     Objective:    Vital Signs:  Pulse 82   Ht 5\' 4"  (1.626 m)  Wt 164 lb (74.4 kg)   BMI 28.15 kg/m    VITAL SIGNS:  reviewed  ASSESSMENT & PLAN:    1. Atrial fibrillation, permanent. Rate is well controlled on diltiazem. She is anticoagulated on Coumadin. INR 2.1 last month  2. Mild to Moderate MR. Last Echo 8/16. Asymptomatic.   3. Hypertension with hypertensive heart disease, BP has been controlled.  4. Hyperlipidemia on statin therapy. Last LDL improved to 77  5. Pulmonary hypertension-moderate  6. Coronary disease. Cardiac catheterization in 2010 showed severe stenosis in a small diagonal branch. She otherwise had no significant disease. She is asymptomatic.   7. Chronic bronchitis and COPD. Currently well controlled.    COVID-19 Education: The signs and symptoms of COVID-19 were discussed with the patient and how to seek care for testing (follow up with PCP or arrange E-visit).  The importance of social distancing was discussed today.  Time:   Today, I have spent 10 minutes with the patient with telehealth technology discussing the above problems.     Medication Adjustments/Labs and Tests Ordered: Current medicines are reviewed at length with the patient today.  Concerns regarding medicines are outlined above.   Tests Ordered: No orders of the defined types were  placed in this encounter.   Medication Changes: No orders of the defined types were placed in this encounter.   Disposition:  Follow up in 6 month(s)  Signed, Lyly Canizales [name], MD  03/28/2019 10:33 AM    [facility]

## 2019-03-27 DIAGNOSIS — L570 Actinic keratosis: Secondary | ICD-10-CM | POA: Diagnosis not present

## 2019-03-27 DIAGNOSIS — R233 Spontaneous ecchymoses: Secondary | ICD-10-CM | POA: Diagnosis not present

## 2019-03-28 ENCOUNTER — Other Ambulatory Visit: Payer: Self-pay

## 2019-03-28 ENCOUNTER — Encounter: Payer: Self-pay | Admitting: Cardiology

## 2019-03-28 ENCOUNTER — Telehealth (INDEPENDENT_AMBULATORY_CARE_PROVIDER_SITE_OTHER): Payer: Medicare Other | Admitting: Cardiology

## 2019-03-28 VITALS — HR 82 | Ht 64.0 in | Wt 164.0 lb

## 2019-03-28 DIAGNOSIS — Z7901 Long term (current) use of anticoagulants: Secondary | ICD-10-CM

## 2019-03-28 DIAGNOSIS — I482 Chronic atrial fibrillation, unspecified: Secondary | ICD-10-CM

## 2019-03-28 DIAGNOSIS — I10 Essential (primary) hypertension: Secondary | ICD-10-CM

## 2019-03-28 NOTE — Patient Instructions (Addendum)
Continue your current therapy  Follow up in 6 months     Call 3 months before to schedule

## 2019-03-29 ENCOUNTER — Telehealth: Payer: Self-pay

## 2019-03-29 DIAGNOSIS — M542 Cervicalgia: Secondary | ICD-10-CM | POA: Diagnosis not present

## 2019-03-29 DIAGNOSIS — G894 Chronic pain syndrome: Secondary | ICD-10-CM | POA: Diagnosis not present

## 2019-03-29 NOTE — Telephone Encounter (Signed)

## 2019-04-02 ENCOUNTER — Other Ambulatory Visit: Payer: Self-pay

## 2019-04-02 ENCOUNTER — Ambulatory Visit (INDEPENDENT_AMBULATORY_CARE_PROVIDER_SITE_OTHER): Payer: Medicare Other | Admitting: *Deleted

## 2019-04-02 DIAGNOSIS — I482 Chronic atrial fibrillation, unspecified: Secondary | ICD-10-CM | POA: Diagnosis not present

## 2019-04-02 DIAGNOSIS — I4891 Unspecified atrial fibrillation: Secondary | ICD-10-CM

## 2019-04-02 DIAGNOSIS — Z5181 Encounter for therapeutic drug level monitoring: Secondary | ICD-10-CM

## 2019-04-02 DIAGNOSIS — Z7901 Long term (current) use of anticoagulants: Secondary | ICD-10-CM

## 2019-04-02 LAB — POCT INR: INR: 3.6 — AB (ref 2.0–3.0)

## 2019-04-02 NOTE — Patient Instructions (Signed)
Description   Skip today's dose then  continue taking 1 tablet each Monday, Wednesday and Friday, 1/2 tablet all other days. Repeat INR in 3 weeks.

## 2019-04-03 ENCOUNTER — Telehealth: Payer: Self-pay | Admitting: Pharmacist Clinician (PhC)/ Clinical Pharmacy Specialist

## 2019-04-03 NOTE — Telephone Encounter (Signed)
New Message::     Please call, she says it is urgent that she talk to you asap.Marland Kitchen

## 2019-04-03 NOTE — Telephone Encounter (Signed)
Returned call to pt, she states she would only like to speak with Erasmo Downer or Raquel. Advised pt that one of them will give her a call tomorrow and she verbalized understanding, no urgent needs this evening.

## 2019-04-04 NOTE — Telephone Encounter (Signed)
Returned call.  Patient concerned b/c of ongoing problems with diarrhea, which causes INR to rise.  Advised patient to switch to BRAT diet for several days then slowly add in foods to see if she can determine a cause.  She should also contact her PCP or GI (has seen 2) to see if they can follow up with her.

## 2019-04-19 ENCOUNTER — Other Ambulatory Visit: Payer: Self-pay | Admitting: Obstetrics and Gynecology

## 2019-04-19 DIAGNOSIS — E2839 Other primary ovarian failure: Secondary | ICD-10-CM

## 2019-04-29 ENCOUNTER — Other Ambulatory Visit: Payer: Self-pay | Admitting: Cardiology

## 2019-04-29 ENCOUNTER — Telehealth: Payer: Self-pay

## 2019-04-29 NOTE — Telephone Encounter (Signed)
Patient called stating she is having diarrhea still and she has seen 2 different GI doctors for this pt stated they did not help her she was only given samples by dr.Mann and she was instructed to give [place] a call  For an appointment by the 2nd GI doctor. Pt has consented to a telephone visit on 06/23. YRL,RMA

## 2019-04-30 ENCOUNTER — Ambulatory Visit (INDEPENDENT_AMBULATORY_CARE_PROVIDER_SITE_OTHER): Payer: Medicare Other | Admitting: *Deleted

## 2019-04-30 ENCOUNTER — Encounter: Payer: Self-pay | Admitting: Nurse Practitioner

## 2019-04-30 ENCOUNTER — Ambulatory Visit (INDEPENDENT_AMBULATORY_CARE_PROVIDER_SITE_OTHER): Payer: Medicare Other | Admitting: Nurse Practitioner

## 2019-04-30 ENCOUNTER — Other Ambulatory Visit: Payer: Self-pay

## 2019-04-30 VITALS — Wt 164.0 lb

## 2019-04-30 DIAGNOSIS — I482 Chronic atrial fibrillation, unspecified: Secondary | ICD-10-CM | POA: Diagnosis not present

## 2019-04-30 DIAGNOSIS — I4891 Unspecified atrial fibrillation: Secondary | ICD-10-CM

## 2019-04-30 DIAGNOSIS — Z5181 Encounter for therapeutic drug level monitoring: Secondary | ICD-10-CM | POA: Diagnosis not present

## 2019-04-30 DIAGNOSIS — K529 Noninfective gastroenteritis and colitis, unspecified: Secondary | ICD-10-CM

## 2019-04-30 DIAGNOSIS — Z7901 Long term (current) use of anticoagulants: Secondary | ICD-10-CM

## 2019-04-30 LAB — POCT INR: INR: 4.2 — AB (ref 2.0–3.0)

## 2019-04-30 NOTE — Patient Instructions (Addendum)
Description   Skip today's dose then change your dose to 1/2 tablet daily except 1 tablet on Mondays.  Repeat INR in 1 week.

## 2019-04-30 NOTE — Progress Notes (Signed)
Virtual Visit via Video    This visit type was conducted due to national recommendations for restrictions regarding the COVID-19 Pandemic (e.g. social distancing) in an effort to limit this patient's exposure and mitigate transmission in our community.  Patients identity confirmed using two different identifiers.  This format is felt to be most appropriate for this patient at this time.  All issues noted in this document were discussed and addressed.  No physical exam was performed (except for noted visual exam findings with Video Visits).    Date:  05/05/2019   ID:  [name], DOB [birth year], MRN [id number]  Patient Location:  Home - spoke with [name]  Provider location:   Office    Chief Complaint:  diarrhea  History of Present Illness:    [name] is a 85 y.o. female who presents via video conferencing for a telehealth visit today.    The patient does not have symptoms concerning for COVID-19 infection (fever, chills, cough, or new shortness of breath).   She has been seen by Dr. Collene Mares - had a colonoscopy a few years ago.  She was given multiple samples for her diarrhea and was not effective.  She went to Dr. Lyndel Safe in [facility] for a second opinion and when she went to Dr. Collene Mares and could not come back.  She is taking immodium AD daily and TUMS (occasional for burning sensation) recently started.  She is having a mixture of soft stool/liquid.  She is eating increased amounts of green leafy vegetables due to her afib. She does not take a probiotic not effective. Dr. Lyda Jester in [place] - she has requested records from Dr. Lorie Apley office.  Taking Immodium once per week.  Whenever she has an episode with her Warfarin she has diarrhea.  Most recent INR is 4.2 this morning.    Diarrhea  This is a chronic problem. The current episode started more than 1 year ago. The problem has been gradually worsening. The patient states that diarrhea does not awaken her  from sleep. Pertinent negatives include no abdominal pain or fever. She has tried anti-motility drug for the symptoms. There is no history of bowel resection.     Past Medical History:  Diagnosis Date  . Anemia   . Anticoagulated on Coumadin    CHRONIC  . Arthritis   . Back pain, chronic   . Chronic atrial fibrillation   . Coronary artery disease CARDIOLOGIST- DR [name]--  LOV IN EPIC  . Depression   . Hammer toe   . Hypercholesterolemia   . Hypertension   . Hypertensive heart disease    WITH LVH  . LVH (left ventricular hypertrophy) due to hypertensive disease   . Mitral insufficiency    CHRONIC  . Pulmonary hypertension ([place])   . Transfusion history    Past Surgical History:  Procedure Laterality Date  . ABDOMINAL HYSTERECTOMY    . ANTERIOR REPAIR/ [place] PUBOVAGINAL SLING  05-03-2002  . BUNIONECTOMY WITH HAMMERTOE RECONSTRUCTION Right 03/01/2013   Procedure: RIGHT FOOT EXCISION BUNIONETTE AND FUSION OF DIP FOURTH TOE;  Surgeon: [name], MD;  Location: [place];  Service: Orthopedics;  Laterality: Right;  . CARDIAC CATHETERIZATION  02-03-2009  DR  [name]   SINGLE-VESSEL OBSTRUCTIVE CAD, SMALL DIAGONAL BRANCH/ NORMAL LVF/ SEVERE MITRAL INSUFFICIENCY/ SEVERE PULMONARY HYPERTENSION  . CATARACT EXTRACTION W/ INTRAOCULAR LENS  IMPLANT, BILATERAL  2003  . COLONOSCOPY  07/25/2014   few mall scattered diverticula in the ectire examined  colon. Small internal hemmorhoids.   Marland Kitchen HAMMER TOE SURGERY  02/28/2012   Procedure: HAMMER TOE CORRECTION;  Surgeon: [name], MD;  Location: [facility];  Service: Orthopedics;  Laterality: Right;  FUSION OF PROXIMAL INTERPHALANGEAL  RIGHT THIRD CLAW TOE  . PARS PLANA VITRECTOMY W/ REPAIR OF MACULAR HOLE  02-04-2002   LEFT EYE  . REPAIR RIGHT SECOND CLAW TOE/ VARUS MEDIAL CLOSING WEDGE OSTEOTOMY PROXIMAL PHALANX RIGHT GREAT TOE  05-05-2005  . TOTAL HIP ARTHROPLASTY     BILATERAL---  LEFT 4/98;   RIGHT   11/98  . VARUS OSTEOTOMY PROXIMAL PHALANX, LEFT GREAT TOE/ PARING DOWN THE CALLUS , SECOND LEFT TOE  10-27-2008     Current Meds  Medication Sig  . ascorbic acid (VITAMIN C) 500 MG tablet Take 500 mg by mouth daily. Takes 650 mg daily  . atorvastatin (LIPITOR) 20 MG tablet Take 1 tablet (20 mg total) by mouth daily.  . Biotin 1000 MCG tablet Take 1 tablet by mouth daily.   . Calcium Carbonate-Vitamin D (CALCIUM + D PO) Take by mouth daily.    . Cholecalciferol (VITAMIN D PO) Take by mouth daily.    . Cyanocobalamin (VITAMIN B-12 PO) Take by mouth as directed.  . diltiazem (TIAZAC) 120 MG 24 hr capsule Take 1 capsule (120 mg total) by mouth daily.  Marland Kitchen estrogens, conjugated, (PREMARIN) 1.25 MG tablet Take 1.25 mg by mouth daily. Takes 3 x week Mon-Wed-Fri  . folic acid (FOLVITE) 1 MG tablet Take 1 mg by mouth daily.  Marland Kitchen gabapentin (NEURONTIN) 300 MG capsule Take 1 capsule by mouth daily.  Marland Kitchen HYDROcodone-acetaminophen (NORCO) 10-325 MG per tablet Take 1 tablet by mouth every 6 (six) hours as needed.  Marland Kitchen lisinopril (PRINIVIL,ZESTRIL) 20 MG tablet Take 1 tablet (20 mg total) by mouth daily.  . Misc Natural Products (LUTEIN 20 PO) Take 1 tablet by mouth daily.  . potassium chloride SA (K-DUR) 20 MEQ tablet TAKE ONE TABLET BY MOUTH TWICE DAILY  . sertraline (ZOLOFT) 50 MG tablet Take 1&1/2 tablets daily  . valACYclovir (VALTREX) 500 MG tablet   . vitamin A 8000 UNIT capsule Take 8,000 Units by mouth daily.    Marland Kitchen warfarin (COUMADIN) 5 MG tablet TAKE 1/2 TO 1 TABLET BY MOUTH EVERY DAY AS DIRECTED BY COUMADIN CLINIC  . [DISCONTINUED] Ascorbic Acid (VITAMIN C) 1000 MG tablet Take 1,000 mg by mouth daily.    . [DISCONTINUED] furosemide (LASIX) 40 MG tablet Take 0.5 tablets (20 mg total) by mouth daily.     Allergies:   Codeine, Sulfanilamide, and Penicillins   Social History   Tobacco Use  . Smoking status: Never Smoker  . Smokeless tobacco: Never Used  Substance Use Topics  . Alcohol use: Yes     Comment: occasional  . Drug use: No     Family Hx: The patient's family history includes Heart disease (age of onset: 80) in her mother; Prostate cancer (age of onset: 34) in her father.  ROS:   Please see the history of present illness.    Review of Systems  Constitutional: Negative for fever.  Gastrointestinal: Positive for diarrhea. Negative for abdominal pain and blood in stool.  Neurological: Negative for dizziness and tingling.    All other systems reviewed and are negative.   Labs/Other Tests and Data Reviewed:    Recent Labs: 01/03/2019: ALT 27; BUN 14; Creatinine, Ser 1.17; Potassium 4.6; Sodium 144   Recent Lipid Panel Lab Results  Component Value Date/Time  CHOL 167 01/03/2019 12:39 PM   TRIG 188 (H) 01/03/2019 12:39 PM   HDL 52 01/03/2019 12:39 PM   CHOLHDL 3.2 01/03/2019 12:39 PM   CHOLHDL 2 01/26/2012 09:24 AM   LDLCALC 77 01/03/2019 12:39 PM    Wt Readings from Last 3 Encounters:  04/30/19 164 lb (74.4 kg)  03/28/19 164 lb (74.4 kg)  01/03/19 161 lb 3.2 oz (73.1 kg)     Exam:    Vital Signs:  Wt 164 lb (74.4 kg)   BMI 28.15 kg/m     Physical Exam  Constitutional: She is oriented to person, place, and time and well-developed, well-nourished, and in no distress.  Pulmonary/Chest: Effort normal.  Neurological: She is alert and oriented to person, place, and time.  Psychiatric: Mood, memory, affect and judgment normal.    ASSESSMENT & PLAN:    1. Chronic diarrhea  She has been having ongoing intermittent diarrhea for more than a year  She would like to be seen by GI for further evaluation has tried a medication previously but unsure of the name   Encouraged to take a probiotic daily to help decrease the frequency     COVID-19 Education: The signs and symptoms of COVID-19 were discussed with the patient and how to seek care for testing (follow up with PCP or arrange E-visit).  The importance of social distancing was discussed today.   Patient Risk:   After full review of this patients clinical status, I feel that they are at least moderate risk at this time.  Time:   Today, I have spent 11 minutes/ seconds with the patient with telehealth technology discussing above diagnoses.     Medication Adjustments/Labs and Tests Ordered: Current medicines are reviewed at length with the patient today.  Concerns regarding medicines are outlined above.   Tests Ordered: No orders of the defined types were placed in this encounter.   Medication Changes: No orders of the defined types were placed in this encounter.   Disposition:  Follow up prn  Signed, Minette Brine, FNP

## 2019-04-30 NOTE — Patient Instructions (Signed)

## 2019-05-01 ENCOUNTER — Other Ambulatory Visit: Payer: Self-pay | Admitting: Cardiology

## 2019-05-01 ENCOUNTER — Telehealth: Payer: Self-pay

## 2019-05-01 MED ORDER — FUROSEMIDE 40 MG PO TABS: 20.0000 mg | ORAL_TABLET | Freq: Every day | ORAL | 1 refills | Status: DC

## 2019-05-01 NOTE — Telephone Encounter (Signed)
 *  STAT* If patient is at the pharmacy, call can be transferred to refill team.   1. Which medications need to be refilled? (please list name of each medication and dose if known) furosemide (LASIX) 40 MG tablet  2. Which pharmacy/location (including street and city if local pharmacy) is medication to be sent to? [facility]  3. Do they need a 30 day or 90 day supply? [id number]  Pharmacy states they need a new script

## 2019-05-01 NOTE — Telephone Encounter (Signed)

## 2019-05-06 ENCOUNTER — Other Ambulatory Visit: Payer: Self-pay | Admitting: Nurse Practitioner

## 2019-05-06 DIAGNOSIS — K529 Noninfective gastroenteritis and colitis, unspecified: Secondary | ICD-10-CM

## 2019-05-08 ENCOUNTER — Encounter: Payer: Self-pay | Admitting: Nurse Practitioner

## 2019-05-08 ENCOUNTER — Other Ambulatory Visit: Payer: Self-pay

## 2019-05-08 ENCOUNTER — Ambulatory Visit (INDEPENDENT_AMBULATORY_CARE_PROVIDER_SITE_OTHER): Payer: Medicare Other

## 2019-05-08 ENCOUNTER — Ambulatory Visit: Payer: Medicare Other | Admitting: Nurse Practitioner

## 2019-05-08 ENCOUNTER — Encounter: Payer: Medicare Other | Admitting: Nurse Practitioner

## 2019-05-08 ENCOUNTER — Ambulatory Visit (INDEPENDENT_AMBULATORY_CARE_PROVIDER_SITE_OTHER): Payer: Medicare Other | Admitting: Pharmacist

## 2019-05-08 ENCOUNTER — Ambulatory Visit (INDEPENDENT_AMBULATORY_CARE_PROVIDER_SITE_OTHER): Payer: Medicare Other | Admitting: Nurse Practitioner

## 2019-05-08 VITALS — BP 128/70 | HR 86 | Temp 98.3°F | Ht 60.8 in | Wt 163.8 lb

## 2019-05-08 DIAGNOSIS — I10 Essential (primary) hypertension: Secondary | ICD-10-CM

## 2019-05-08 DIAGNOSIS — I272 Pulmonary hypertension, unspecified: Secondary | ICD-10-CM

## 2019-05-08 DIAGNOSIS — E6609 Other obesity due to excess calories: Secondary | ICD-10-CM

## 2019-05-08 DIAGNOSIS — Z0000 Encounter for general adult medical examination without abnormal findings: Secondary | ICD-10-CM

## 2019-05-08 DIAGNOSIS — Z5181 Encounter for therapeutic drug level monitoring: Secondary | ICD-10-CM | POA: Diagnosis not present

## 2019-05-08 DIAGNOSIS — Z6831 Body mass index (BMI) 31.0-31.9, adult: Secondary | ICD-10-CM

## 2019-05-08 DIAGNOSIS — E785 Hyperlipidemia, unspecified: Secondary | ICD-10-CM

## 2019-05-08 DIAGNOSIS — I482 Chronic atrial fibrillation, unspecified: Secondary | ICD-10-CM

## 2019-05-08 DIAGNOSIS — I4891 Unspecified atrial fibrillation: Secondary | ICD-10-CM | POA: Diagnosis not present

## 2019-05-08 DIAGNOSIS — R7303 Prediabetes: Secondary | ICD-10-CM

## 2019-05-08 DIAGNOSIS — Z7901 Long term (current) use of anticoagulants: Secondary | ICD-10-CM

## 2019-05-08 LAB — POCT URINALYSIS DIPSTICK
Bilirubin, UA: NEGATIVE
Glucose, UA: NEGATIVE
Ketones, UA: NEGATIVE
Leukocytes, UA: NEGATIVE
Nitrite, UA: NEGATIVE
Protein, UA: NEGATIVE
Spec Grav, UA: >=1.03 — AB (ref 1.010–1.025)
Urobilinogen, UA: 0.2 E.U./dL
pH, UA: 6 (ref 5.0–8.0)

## 2019-05-08 LAB — POCT UA - MICROALBUMIN
Albumin/Creatinine Ratio, Urine, POC: <30
Creatinine, POC: 200 mg/dL
Microalbumin Ur, POC: 30 mg/L

## 2019-05-08 LAB — POCT INR: INR: 2.8 (ref 2.0–3.0)

## 2019-05-08 NOTE — Progress Notes (Signed)
Subjective:     Patient ID: [name] , female    DOB: [birth year] , 85 y.o.   MRN: [id number]   Chief Complaint  Patient presents with  . Hypertension    HPI  Diabetes She presents for her follow-up diabetic visit. Diabetes type: prediabetes. Pertinent negatives for hypoglycemia include no confusion, dizziness, headaches or nervousness/anxiousness. There are no diabetic associated symptoms. Pertinent negatives for diabetes include no chest pain, no fatigue and no weakness. There are no hypoglycemic complications. Symptoms are stable. There are no diabetic complications. She is compliant with treatment all of the time.  Hypertension This is a chronic problem. The current episode started more than 1 year ago. The problem is unchanged. The problem is controlled. Pertinent negatives include no chest pain, headaches, palpitations or shortness of breath. There are no associated agents to hypertension.     Past Medical History:  Diagnosis Date  . Anemia   . Anticoagulated on Coumadin    CHRONIC  . Arthritis   . Back pain, chronic   . Chronic atrial fibrillation   . Coronary artery disease CARDIOLOGIST- DR [name]--  LOV IN EPIC  . Depression   . Hammer toe   . Hypercholesterolemia   . Hypertension   . Hypertensive heart disease    WITH LVH  . LVH (left ventricular hypertrophy) due to hypertensive disease   . Mitral insufficiency    CHRONIC  . Pulmonary hypertension ([place])   . Transfusion history      Family History  Problem Relation Age of Onset  . Heart disease Mother 47  . Prostate cancer Father 40     Current Outpatient Medications:  .  ascorbic acid (VITAMIN C) 500 MG tablet, Take 500 mg by mouth daily. Takes 650 mg daily, Disp: , Rfl:  .  atorvastatin (LIPITOR) 20 MG tablet, Take 1 tablet (20 mg total) by mouth daily., Disp: 90 tablet, Rfl: 1 .  Biotin 1000 MCG tablet, Take 1 tablet by mouth daily. , Disp: , Rfl:  .  Calcium Carbonate-Vitamin D (CALCIUM + D  PO), Take by mouth daily.  , Disp: , Rfl:  .  Cholecalciferol (VITAMIN D PO), Take by mouth daily.  , Disp: , Rfl:  .  Cyanocobalamin (VITAMIN B-12 PO), Take by mouth as directed., Disp: , Rfl:  .  diltiazem (TIAZAC) 120 MG 24 hr capsule, Take 1 capsule (120 mg total) by mouth daily., Disp: 30 capsule, Rfl: 9 .  estrogens, conjugated, (PREMARIN) 1.25 MG tablet, Take 1.25 mg by mouth daily. Takes 3 x week Mon-Wed-Fri, Disp: , Rfl:  .  folic acid (FOLVITE) 1 MG tablet, Take 1 mg by mouth daily., Disp: , Rfl:  .  furosemide (LASIX) 40 MG tablet, Take 0.5 tablets (20 mg total) by mouth daily., Disp: 45 tablet, Rfl: 1 .  gabapentin (NEURONTIN) 300 MG capsule, Take 1 capsule by mouth daily., Disp: , Rfl:  .  HYDROcodone-acetaminophen (NORCO) 10-325 MG per tablet, Take 1 tablet by mouth every 6 (six) hours as needed., Disp: , Rfl:  .  lisinopril (PRINIVIL,ZESTRIL) 20 MG tablet, Take 1 tablet (20 mg total) by mouth daily., Disp: 30 tablet, Rfl: 6 .  Misc Natural Products (LUTEIN 20 PO), Take 1 tablet by mouth daily., Disp: , Rfl:  .  multivitamin-lutein (OCUVITE-LUTEIN) CAPS capsule, Take 1 capsule by mouth daily., Disp: , Rfl:  .  potassium chloride SA (K-DUR) 20 MEQ tablet, TAKE ONE TABLET BY MOUTH TWICE DAILY, Disp: 60 tablet, Rfl: 10 .  sertraline (ZOLOFT) 50 MG tablet, Take 1&1/2 tablets daily, Disp: , Rfl:  .  valACYclovir (VALTREX) 500 MG tablet, , Disp: , Rfl:  .  vitamin A 8000 UNIT capsule, Take 8,000 Units by mouth daily.  , Disp: , Rfl:  .  warfarin (COUMADIN) 5 MG tablet, TAKE 1/2 TO 1 TABLET BY MOUTH EVERY DAY AS DIRECTED BY COUMADIN CLINIC, Disp: 90 tablet, Rfl: 1   Allergies  Allergen Reactions  . Codeine Nausea And Vomiting  . Sulfanilamide   . Penicillins Rash     Review of Systems  Constitutional: Negative.  Negative for fatigue.  Eyes: Negative for visual disturbance.  Respiratory: Negative.  Negative for shortness of breath.   Cardiovascular: Negative.  Negative for chest  pain, palpitations and leg swelling.  Gastrointestinal: Negative.   Endocrine: Negative.   Musculoskeletal: Negative.   Skin: Negative.   Neurological: Negative for dizziness, weakness and headaches.  Psychiatric/Behavioral: Negative for confusion. The patient is not nervous/anxious.      Today's Vitals   05/08/19 1128  BP: 128/70  Pulse: 86  Temp: 98.3 F (36.8 C)  TempSrc: Oral  SpO2: 96%  Weight: 163 lb 12.8 oz (74.3 kg)  Height: 5' 0.8" (1.544 m)   Body mass index is 31.15 kg/m.   Objective:  Physical Exam Vitals signs reviewed.  Constitutional:      Appearance: She is well-developed.  HENT:     Head: Normocephalic and atraumatic.  Eyes:     Pupils: Pupils are equal, round, and reactive to light.  Cardiovascular:     Rate and Rhythm: Normal rate and regular rhythm.     Pulses: Normal pulses.     Heart sounds: Normal heart sounds. No murmur.  Pulmonary:     Effort: Pulmonary effort is normal.     Breath sounds: Normal breath sounds.  Musculoskeletal: Normal range of motion.  Skin:    General: Skin is warm and dry.     Capillary Refill: Capillary refill takes less than 2 seconds.  Neurological:     General: No focal deficit present.     Mental Status: She is alert and oriented to person, place, and time.     Cranial Nerves: No cranial nerve deficit.  Psychiatric:        Mood and Affect: Mood normal.         Assessment And Plan:   1. Prediabetes  Chronic, good control  No current medications  Encouraged to limit intake of sugary foods and drinks - CMP14 + Anion Gap - Hemoglobin A1c  2. Essential hypertension . B/P is controlled.  . CMP ordered to check renal function.  Marland Kitchen No changes with medications . Followed by Dr. [name] approximately 2 months ago - CMP14 + Anion Gap  3. Pulmonary hypertension ([place])   4. Hyperlipidemia, unspecified hyperlipidemia type  Chronic, well controlled  Continue with current medications - Lipid panel  5.  Chronic atrial fibrillation  Stable,  Continue with follow up with cardiology  6. Body mass index (BMI) of 31.0-31.9 in adult   7. Class 1 obesity due to excess calories without serious comorbidity with body mass index (BMI) of 31.0 to 31.9 in adult  Encouraged to increase physical activity with chair exercises and eat a healthy diet    Minette Brine, FNP

## 2019-05-08 NOTE — Patient Instructions (Signed)
[name] , Thank you for taking time to come for your Medicare Wellness Visit. I appreciate your ongoing commitment to your health goals. Please review the following plan we discussed and let me know if I can assist you in the future.   Screening recommendations/referrals: Colonoscopy: not required Mammogram: 10/2018 Bone Density: 01/2017 Recommended yearly ophthalmology/optometry visit for glaucoma screening and checkup Recommended yearly dental visit for hygiene and checkup  Vaccinations: Influenza vaccine: 08/2018 Pneumococcal vaccine: 12/2013 Tdap vaccine: 04/2011 Shingles vaccine: discussed    Advanced directives: Please bring a copy of your POA (Power of [place]) and/or Living Will to your next appointment.    Conditions/risks identified: obesity  Next appointment: 11/13/2019 at 10:45   Preventive Care 83 Years and Older, Female Preventive care refers to lifestyle choices and visits with your health care provider that can promote health and wellness. What does preventive care include?  A yearly physical exam. This is also called an annual well check.  Dental exams once or twice a year.  Routine eye exams. Ask your health care provider how often you should have your eyes checked.  Personal lifestyle choices, including:  Daily care of your teeth and gums.  Regular physical activity.  Eating a healthy diet.  Avoiding tobacco and drug use.  Limiting alcohol use.  Practicing safe sex.  Taking low-dose aspirin every day.  Taking vitamin and mineral supplements as recommended by your health care provider. What happens during an annual well check? The services and screenings done by your health care provider during your annual well check will depend on your age, overall health, lifestyle risk factors, and family history of disease. Counseling  Your health care provider may ask you questions about your:  Alcohol use.  Tobacco use.  Drug use.  Emotional  well-being.  Home and relationship well-being.  Sexual activity.  Eating habits.  History of falls.  Memory and ability to understand (cognition).  Work and work Statistician.  Reproductive health. Screening  You may have the following tests or measurements:  Height, weight, and BMI.  Blood pressure.  Lipid and cholesterol levels. These may be checked every 5 years, or more frequently if you are over 23 years old.  Skin check.  Lung cancer screening. You may have this screening every year starting at age 83 if you have a 30-pack-year history of smoking and currently smoke or have quit within the past 15 years.  Fecal occult blood test (FOBT) of the stool. You may have this test every year starting at age 83.  Flexible sigmoidoscopy or colonoscopy. You may have a sigmoidoscopy every 5 years or a colonoscopy every 10 years starting at age 83.  Hepatitis C blood test.  Hepatitis B blood test.  Sexually transmitted disease (STD) testing.  Diabetes screening. This is done by checking your blood sugar (glucose) after you have not eaten for a while (fasting). You may have this done every 1-3 years.  Bone density scan. This is done to screen for osteoporosis. You may have this done starting at age 83.  Mammogram. This may be done every 1-2 years. Talk to your health care provider about how often you should have regular mammograms. Talk with your health care provider about your test results, treatment options, and if necessary, the need for more tests. Vaccines  Your health care provider may recommend certain vaccines, such as:  Influenza vaccine. This is recommended every year.  Tetanus, diphtheria, and acellular pertussis (Tdap, Td) vaccine. You may need a Td  booster every 10 years.  Zoster vaccine. You may need this after age 53.  Pneumococcal 13-valent conjugate (PCV13) vaccine. One dose is recommended after age 27.  Pneumococcal polysaccharide (PPSV23) vaccine. One  dose is recommended after age 21. Talk to your health care provider about which screenings and vaccines you need and how often you need them. This information is not intended to replace advice given to you by your health care provider. Make sure you discuss any questions you have with your health care provider. Document Released: 11/20/2015 Document Revised: 07/13/2016 Document Reviewed: 08/25/2015 Elsevier Interactive Patient Education  2017 [place] Prevention in the Home Falls can cause injuries. They can happen to people of all ages. There are many things you can do to make your home safe and to help prevent falls. What can I do on the outside of my home?  Regularly fix the edges of walkways and driveways and fix any cracks.  Remove anything that might make you trip as you walk through a door, such as a raised step or threshold.  Trim any bushes or trees on the path to your home.  Use bright outdoor lighting.  Clear any walking paths of anything that might make someone trip, such as rocks or tools.  Regularly check to see if handrails are loose or broken. Make sure that both sides of any steps have handrails.  Any raised decks and porches should have guardrails on the edges.  Have any leaves, snow, or ice cleared regularly.  Use sand or salt on walking paths during winter.  Clean up any spills in your garage right away. This includes oil or grease spills. What can I do in the bathroom?  Use night lights.  Install grab bars by the toilet and in the tub and shower. Do not use towel bars as grab bars.  Use non-skid mats or decals in the tub or shower.  If you need to sit down in the shower, use a plastic, non-slip stool.  Keep the floor dry. Clean up any water that spills on the floor as soon as it happens.  Remove soap buildup in the tub or shower regularly.  Attach bath mats securely with double-sided non-slip rug tape.  Do not have throw rugs and other  things on the floor that can make you trip. What can I do in the bedroom?  Use night lights.  Make sure that you have a light by your bed that is easy to reach.  Do not use any sheets or blankets that are too big for your bed. They should not hang down onto the floor.  Have a firm chair that has side arms. You can use this for support while you get dressed.  Do not have throw rugs and other things on the floor that can make you trip. What can I do in the kitchen?  Clean up any spills right away.  Avoid walking on wet floors.  Keep items that you use a lot in easy-to-reach places.  If you need to reach something above you, use a strong step stool that has a grab bar.  Keep electrical cords out of the way.  Do not use floor polish or wax that makes floors slippery. If you must use wax, use non-skid floor wax.  Do not have throw rugs and other things on the floor that can make you trip. What can I do with my stairs?  Do not leave any items on the stairs.  Make sure that there are handrails on both sides of the stairs and use them. Fix handrails that are broken or loose. Make sure that handrails are as long as the stairways.  Check any carpeting to make sure that it is firmly attached to the stairs. Fix any carpet that is loose or worn.  Avoid having throw rugs at the top or bottom of the stairs. If you do have throw rugs, attach them to the floor with carpet tape.  Make sure that you have a light switch at the top of the stairs and the bottom of the stairs. If you do not have them, ask someone to add them for you. What else can I do to help prevent falls?  Wear shoes that:  Do not have high heels.  Have rubber bottoms.  Are comfortable and fit you well.  Are closed at the toe. Do not wear sandals.  If you use a stepladder:  Make sure that it is fully opened. Do not climb a closed stepladder.  Make sure that both sides of the stepladder are locked into place.  Ask  someone to hold it for you, if possible.  Clearly mark and make sure that you can see:  Any grab bars or handrails.  First and last steps.  Where the edge of each step is.  Use tools that help you move around (mobility aids) if they are needed. These include:  Canes.  Walkers.  Scooters.  Crutches.  Turn on the lights when you go into a dark area. Replace any light bulbs as soon as they burn out.  Set up your furniture so you have a clear path. Avoid moving your furniture around.  If any of your floors are uneven, fix them.  If there are any pets around you, be aware of where they are.  Review your medicines with your doctor. Some medicines can make you feel dizzy. This can increase your chance of falling. Ask your doctor what other things that you can do to help prevent falls. This information is not intended to replace advice given to you by your health care provider. Make sure you discuss any questions you have with your health care provider. Document Released: 08/20/2009 Document Revised: 03/31/2016 Document Reviewed: 11/28/2014 Elsevier Interactive Patient Education  2017 [employer].

## 2019-05-08 NOTE — Patient Instructions (Signed)
WALKING  Walking is a great form of exercise to increase your strength, endurance and overall fitness.  A walking program can help you start slowly and gradually build endurance as you go.  Everyone's ability is different, so each person's starting point will be different.  You do not have to follow them exactly.  The are just samples. You should simply find out what's right for you and stick to that program.   In the beginning, you'll start off walking 2-3 times a day for short distances.  As you get stronger, you'll be walking further at just 1-2 times per day.  A. You Can Walk For A Certain Length Of Time Each Day    Walk 5 minutes 3 times per day.  Increase 2 minutes every 2 days (3 times per day).  Work up to 25-30 minutes (1-2 times per day).   Example:   Day 1-2 5 minutes 3 times per day   Day 7-8 12 minutes 2-3 times per day   Day 13-14 25 minutes 1-2 times per day  B. You Can Walk For a Certain Distance Each Day     Distance can be substituted for time.    Example:   3 trips to mailbox (at road)   3 trips to corner of block   3 trips around the block  C. Go to local high school and use the track.     Exercise Information for Aging Adults Staying physically active is important as you age. The four types of exercises that are best for older adults are endurance, strength, balance, and flexibility. Contact your health care provider before you start any exercise routine. Ask your health care provider what activities are safe for you. What are the risks? Risks associated with exercising include:  Overdoing it. This may lead to sore muscles or fatigue.  Falls.  Injuries.  Dehydration. How to do these exercises Endurance exercises Endurance (aerobic) exercises raise your breathing rate and heart rate. Increasing your endurance helps you to do everyday tasks and stay healthy. By improving the health of your body system that includes your heart, lungs, and blood vessels  (circulatory system), you may also delay or prevent diseases such as heart disease, diabetes, and bone loss (osteoporosis). Types of endurance exercises include:  Sports.  Indoor activities, such as using gym equipment, doing water aerobics, or dancing.  Outdoor activities, such as biking or jogging.  Tasks around the house, such as gardening, yard work, and heavy household chores like cleaning.  Walking, such as hiking or walking around your neighborhood. When doing endurance exercises, make sure you:  Are aware of your surroundings.  Use safety equipment as directed.  Dress in layers when exercising outdoors.  Drink plenty of water to stay well hydrated. Build up endurance slowly. Start with 10 minutes at a time, and gradually build up to doing 30 minutes at a time. Unless your health care provider gave you different instructions, aim to exercise for a total of 150 minutes a week. Spread out that time so you are working on endurance on 3 or more days a week. Strength exercises Lifting, pulling, or pushing weights helps to strengthen muscles. Having stronger muscles makes it easier to do everyday activities, such as getting up from a chair, climbing stairs, carrying groceries, and playing with grandchildren. Strength exercises include arm and leg exercises that may be done:  With weights.  Without weights (using your own body weight).  With a resistance band. When doing  strength exercises:  Move smoothly and steadily. Do not suddenly thrust or jerk the weights, the resistance band, or your body.  Start with no weights or with light weights, and gradually add more weight over time. Eventually, aim to use weights that are hard or very hard for you to lift. This means that you are able to do 8 repetitions with the weight, and the last few repetitions are very challenging.  Lift or push weights into position for 3 seconds, hold the position for 1 second, and then take 3 seconds to  return to your starting position.  Breathe out (exhale) during difficult movements, like lifting or pushing weights. Breathe in (inhale) to relax your muscles before the next repetition.  Consider alternating arms or legs, especially when you first start strength exercises.  Expect some slight muscle soreness after each session. Do strength exercises on 2 or more days a week, for 30 minutes at a time. Avoid exercising the same muscle groups two days in a row. For example, if you work on your leg muscles one day, work on your arm muscles the next day. When you can do two sets of 10-15 repetitions with a certain weight, increase the amount of weight. Balance Balance exercises can help to prevent falls. Balance exercises include:  Standing on one foot.  Heel-to-toe walk.  Balance walk.  Tai chi. Make sure you have something sturdy to hold onto while doing balance exercises, such as a sturdy chair. As your balance improves, challenge yourself by holding onto the chair with one hand instead of two, and then with no hands. Trying exercises with your eyes closed also challenges your balance, but be sure to have a sturdy surface (like a countertop) close by in case you need it. Do balance exercises as often as you want, or as often as directed by your health care provider. Strength exercises for the lower body also help to improve balance. Flexibility  Flexibility exercises improve how far you can bend, straighten, move, or rotate parts of your body (range of motion). These exercises also help you to do everyday activities such as getting dressed or reaching for objects. Flexibility exercises include stretching different parts of the body, and they may be done in a standing or seated position or on the floor. When stretching, make sure you:  Keep a slight bend in your arms and legs. Avoid completely straightening ("locking") your joints.  Do not stretch so far that you feel pain. You should feel a  mild stretching feeling. You may try stretching farther as you become more flexible over time.  Relax and breathe between stretches.  Hold onto something sturdy for balance as needed. Hold each stretch for 10-30 seconds. Repeat each stretch 3-5 times. General safety tips  Exercise in well-lit areas.  Do not hold your breath during exercises or stretches.  Warm up before exercising, and cool down after exercising. This can help prevent injury.  Drink plenty of water during exercise or any activity that makes you sweat.  Use smooth, steady movements. Do not use sudden, jerking movements, especially when lifting weights or doing flexibility exercises.  If you are not sure if an exercise is safe for you, or you are not sure how to do an exercise, talk with your health care provider. This is especially important if you have had surgery on muscles, bones, or joints (orthopedic surgery). Where to find more information You can find more information about exercise for older adults from:  Your  local health department, fitness center, or community center. These facilities may have programs for aging adults.  [employer] on Aging: http://kim-miller.com/  National Council on Aging: www.ncoa.org Summary  Staying physically active is important as you age.  Make sure to contact your health care provider before you start any exercise routine. Ask your health care provider what activities are safe for you.  Doing endurance, strength, balance, and flexibility exercises can help to delay or prevent certain diseases, such as heart disease, diabetes, and bone loss (osteoporosis). This information is not intended to replace advice given to you by your health care provider. Make sure you discuss any questions you have with your health care provider. Document Released: 03/15/2017 Document Revised: 08/16/2018 Document Reviewed: 03/15/2017 Elsevier Patient Education  2020 [employer].

## 2019-05-08 NOTE — Progress Notes (Signed)
Subjective:   [name] is a 85 y.o. female who presents for Medicare Annual (Subsequent) preventive examination.  Review of Systems:  n/a Cardiac Risk Factors include: advanced age (>70men, >33 women);dyslipidemia;hypertension;obesity (BMI >30kg/m2);sedentary lifestyle     Objective:     Vitals: BP 128/70 (Patient Position: Sitting)   Pulse 86   Temp 98.3 F (36.8 C) (Oral)   Ht 5' 0.8" (1.544 m)   Wt 163 lb 12.8 oz (74.3 kg)   BMI 31.15 kg/m   Body mass index is 31.15 kg/m.  Advanced Directives 05/08/2019 03/01/2013 02/27/2013 02/28/2012  Does Patient Have a Medical Advance Directive? Yes Patient has advance directive, copy not in chart Patient has advance directive, copy not in chart Patient has advance directive, copy not in chart  Type of Advance Directive [place];Living will [place];Living will [place];Living will Living will;Healthcare Power of [place] in Chart? No - copy requested Copy requested from family - -  Pre-existing out of facility DNR order (yellow form or pink MOST form) - No - -    Tobacco Social History   Tobacco Use  Smoking Status Never Smoker  Smokeless Tobacco Never Used     Counseling given: Not Answered   Clinical Intake:  Pre-visit preparation completed: Yes  Pain : No/denies pain     Nutritional Status: BMI > 30  Obese Nutritional Risks: None Diabetes: No  How often do you need to have someone help you when you read instructions, pamphlets, or other written materials from your doctor or pharmacy?: 1 - Never What is the last grade level you completed in school?: some college  Interpreter Needed?: No  Information entered by :: NAllen LPN  Past Medical History:  Diagnosis Date  . Anemia   . Anticoagulated on Coumadin    CHRONIC  . Arthritis   . Back pain, chronic   . Chronic atrial fibrillation   . Coronary artery  disease CARDIOLOGIST- DR [name]--  LOV IN EPIC  . Depression   . Hammer toe   . Hypercholesterolemia   . Hypertension   . Hypertensive heart disease    WITH LVH  . LVH (left ventricular hypertrophy) due to hypertensive disease   . Mitral insufficiency    CHRONIC  . Pulmonary hypertension ([place])   . Transfusion history    Past Surgical History:  Procedure Laterality Date  . ABDOMINAL HYSTERECTOMY    . ANTERIOR REPAIR/ [place] PUBOVAGINAL SLING  05-03-2002  . BUNIONECTOMY WITH HAMMERTOE RECONSTRUCTION Right 03/01/2013   Procedure: RIGHT FOOT EXCISION BUNIONETTE AND FUSION OF DIP FOURTH TOE;  Surgeon: [name], MD;  Location: [facility];  Service: Orthopedics;  Laterality: Right;  . CARDIAC CATHETERIZATION  02-03-2009  DR  [name]   SINGLE-VESSEL OBSTRUCTIVE CAD, SMALL DIAGONAL BRANCH/ NORMAL LVF/ SEVERE MITRAL INSUFFICIENCY/ SEVERE PULMONARY HYPERTENSION  . CATARACT EXTRACTION W/ INTRAOCULAR LENS  IMPLANT, BILATERAL  2003  . COLONOSCOPY  07/25/2014   few mall scattered diverticula in the ectire examined colon. Small internal hemmorhoids.   Marland Kitchen HAMMER TOE SURGERY  02/28/2012   Procedure: HAMMER TOE CORRECTION;  Surgeon: [name], MD;  Location: [facility];  Service: Orthopedics;  Laterality: Right;  FUSION OF PROXIMAL INTERPHALANGEAL  RIGHT THIRD CLAW TOE  . PARS PLANA VITRECTOMY W/ REPAIR OF MACULAR HOLE  02-04-2002   LEFT EYE  . REPAIR RIGHT SECOND CLAW TOE/ VARUS MEDIAL CLOSING WEDGE OSTEOTOMY  PROXIMAL PHALANX RIGHT GREAT TOE  05-05-2005  . TOTAL HIP ARTHROPLASTY     BILATERAL---  LEFT 4/98;   RIGHT  11/98  . VARUS OSTEOTOMY PROXIMAL PHALANX, LEFT GREAT TOE/ PARING DOWN THE CALLUS , SECOND LEFT TOE  10-27-2008   Family History  Problem Relation Age of Onset  . Heart disease Mother 28  . Prostate cancer Father 64   Social History   Socioeconomic History  . Marital status: Divorced    Spouse name: Not on file  . Number of children: 3   . Years of education: College  . Highest education level: Not on file  Occupational History  . Occupation: Retired    Fish farm manager: RETIRED  Social Needs  . Financial resource strain: Not hard at all  . Food insecurity    Worry: Never true    Inability: Never true  . Transportation needs    Medical: No    Non-medical: No  Tobacco Use  . Smoking status: Never Smoker  . Smokeless tobacco: Never Used  Substance and Sexual Activity  . Alcohol use: Not Currently    Comment: occasional  . Drug use: No  . Sexual activity: Not Currently  Lifestyle  . Physical activity    Days per week: 0 days    Minutes per session: 0 min  . Stress: Not at all  Relationships  . Social Herbalist on phone: Not on file    Gets together: Not on file    Attends religious service: Not on file    Active member of club or organization: Not on file    Attends meetings of clubs or organizations: Not on file    Relationship status: Not on file  Other Topics Concern  . Not on file  Social History Narrative   Patient lives at home alone.   Caffeine Use: 1 cup daily occasionally    Outpatient Encounter Medications as of 05/08/2019  Medication Sig  . ascorbic acid (VITAMIN C) 500 MG tablet Take 500 mg by mouth daily. Takes 650 mg daily  . atorvastatin (LIPITOR) 20 MG tablet Take 1 tablet (20 mg total) by mouth daily.  . Biotin 1000 MCG tablet Take 1 tablet by mouth daily.   . Calcium Carbonate-Vitamin D (CALCIUM + D PO) Take by mouth daily.    . Cholecalciferol (VITAMIN D PO) Take by mouth daily.    . Cyanocobalamin (VITAMIN B-12 PO) Take by mouth as directed.  . diltiazem (TIAZAC) 120 MG 24 hr capsule Take 1 capsule (120 mg total) by mouth daily.  Marland Kitchen estrogens, conjugated, (PREMARIN) 1.25 MG tablet Take 1.25 mg by mouth daily. Takes 3 x week Mon-Wed-Fri  . folic acid (FOLVITE) 1 MG tablet Take 1 mg by mouth daily.  . furosemide (LASIX) 40 MG tablet Take 0.5 tablets (20 mg total) by mouth daily.  Marland Kitchen  gabapentin (NEURONTIN) 300 MG capsule Take 1 capsule by mouth daily.  Marland Kitchen HYDROcodone-acetaminophen (NORCO) 10-325 MG per tablet Take 1 tablet by mouth every 6 (six) hours as needed.  Marland Kitchen lisinopril (PRINIVIL,ZESTRIL) 20 MG tablet Take 1 tablet (20 mg total) by mouth daily.  . Misc Natural Products (LUTEIN 20 PO) Take 1 tablet by mouth daily.  . multivitamin-lutein (OCUVITE-LUTEIN) CAPS capsule Take 1 capsule by mouth daily.  . potassium chloride SA (K-DUR) 20 MEQ tablet TAKE ONE TABLET BY MOUTH TWICE DAILY  . sertraline (ZOLOFT) 50 MG tablet Take 1&1/2 tablets daily  . valACYclovir (VALTREX) 500 MG tablet   .  vitamin A 8000 UNIT capsule Take 8,000 Units by mouth daily.    Marland Kitchen warfarin (COUMADIN) 5 MG tablet TAKE 1/2 TO 1 TABLET BY MOUTH EVERY DAY AS DIRECTED BY COUMADIN CLINIC  . [DISCONTINUED] diltiazem (CARDIZEM) 120 MG tablet Take 1 tablet (120 mg total) by mouth daily.   No facility-administered encounter medications on file as of 05/08/2019.     Activities of Daily Living In your present state of health, do you have any difficulty performing the following activities: 05/08/2019  Hearing? N  Vision? N  Difficulty concentrating or making decisions? N  Walking or climbing stairs? N  Dressing or bathing? N  Doing errands, shopping? N  Preparing Food and eating ? N  Using the Toilet? N  In the past six months, have you accidently leaked urine? Y  Comment wears a pad  Do you have problems with loss of bowel control? N  Managing your Medications? N  Managing your Finances? N  Housekeeping or managing your Housekeeping? N  Some recent data might be hidden    Patient Care Team: Minette Brine, FNP as PCP - General ([place]) Monna Fam, MD as Consulting Physician (Ophthalmology)    Assessment:   This is a routine wellness examination for [place].  Exercise Activities and Dietary recommendations Current Exercise Habits: The patient does not participate in regular exercise at  present  Goals    . Patient Stated     No goals       Fall Risk Fall Risk  05/08/2019 05/08/2019 05/08/2019 04/30/2019 09/06/2018  Falls in the past year? 0 0 0 0 No  Risk for fall due to : Impaired balance/gait;Medication side effect - - - -  Follow up Falls evaluation completed;Education provided;Falls prevention discussed - - - -   Is the patient's home free of loose throw rugs in walkways, pet beds, electrical cords, etc?   yes      Grab bars in the bathroom? no      Handrails on the stairs?   n/a      Adequate lighting?   yes  Timed Get Up and Go performed: n/a  Depression Screen PHQ 2/9 Scores 05/08/2019 05/08/2019 05/08/2019 05/08/2019  PHQ - 2 Score 0 0 0 0  PHQ- 9 Score 1 - - -     Cognitive Function MMSE - Mini Mental State Exam 05/08/2019 05/08/2019 05/08/2019  Orientation to time 5 - 5  Orientation to Place 5 - -  Registration 3 - -  Attention/ Calculation 5 - -  Recall 3 - -  Language- repeat 1 1 -  Language- read & follow direction 1 1 -  Copy design 1 - -     6CIT Screen 05/08/2019  What Year? 0 points  What month? 0 points  What time? 0 points  Count back from 20 0 points  Months in reverse 2 points  Repeat phrase 0 points  Total Score 2    Immunization History  Administered Date(s) Administered  . DTaP 04/18/2011  . Influenza, High Dose Seasonal PF 08/07/2018  . Influenza,inj,Quad PF,6+ Mos 08/23/2013  . Influenza-Unspecified 08/23/2013, 07/08/2014  . Pneumococcal Conjugate-13 12/27/2013  . Pneumococcal Polysaccharide-23 08/03/2010  . Zoster 04/11/2017    Qualifies for Shingles Vaccine? yes  Screening Tests Health Maintenance  Topic Date Due  . INFLUENZA VACCINE  06/08/2019  . TETANUS/TDAP  04/17/2021  . DEXA SCAN  Completed  . PNA vac Low Risk Adult  Completed    Cancer Screenings: Lung: Low Dose  CT Chest recommended if Age 47-80 years, 30 pack-year currently smoking OR have quit w/in 15years. Patient does not qualify. Breast:  Up to date on  Mammogram? Yes   Up to date of Bone Density/Dexa? Yes Colorectal: not required  Additional Screenings: : Hepatitis C Screening: n/a     Plan:    6 CIT was 2. No goals set.    I have personally reviewed and noted the following in the patient's chart:   . Medical and social history . Use of alcohol, tobacco or illicit drugs  . Current medications and supplements . Functional ability and status . Nutritional status . Physical activity . Advanced directives . List of other physicians . Hospitalizations, surgeries, and ER visits in previous 12 months . Vitals . Screenings to include cognitive, depression, and falls . Referrals and appointments  In addition, I have reviewed and discussed with patient certain preventive protocols, quality metrics, and best practice recommendations. A written personalized care plan for preventive services as well as general preventive health recommendations were provided to patient.     Kellie Simmering, LPN  01/09/7424

## 2019-05-23 ENCOUNTER — Telehealth: Payer: Self-pay

## 2019-05-23 DIAGNOSIS — M7062 Trochanteric bursitis, left hip: Secondary | ICD-10-CM | POA: Diagnosis not present

## 2019-05-23 DIAGNOSIS — M25552 Pain in left hip: Secondary | ICD-10-CM | POA: Diagnosis not present

## 2019-05-23 DIAGNOSIS — M25551 Pain in right hip: Secondary | ICD-10-CM | POA: Diagnosis not present

## 2019-05-23 NOTE — Telephone Encounter (Signed)
Patient called stating Dr.Mysinhaimer would not accept her as a patient because she has seen Dr.Mann and Dr.Grupton. she stated she is still dealing with her symptoms but they are not as bad she wants to know if we can refer her somewhere else.  RETURNED PT CALL AND LEFT V/M TO CALL OFFICE PT NEEDS TO CALL AROUND AND SEE WHICH GI DOCTOR WILL TAKE HER ON AS A PATIENT AND LET us KNOW. Lonia Mad

## 2019-06-06 ENCOUNTER — Other Ambulatory Visit: Payer: Self-pay

## 2019-06-06 ENCOUNTER — Ambulatory Visit (INDEPENDENT_AMBULATORY_CARE_PROVIDER_SITE_OTHER): Payer: Medicare Other | Admitting: Pharmacist Clinician (PhC)/ Clinical Pharmacy Specialist

## 2019-06-06 DIAGNOSIS — I482 Chronic atrial fibrillation, unspecified: Secondary | ICD-10-CM | POA: Diagnosis not present

## 2019-06-06 DIAGNOSIS — I4891 Unspecified atrial fibrillation: Secondary | ICD-10-CM | POA: Diagnosis not present

## 2019-06-06 DIAGNOSIS — Z5181 Encounter for therapeutic drug level monitoring: Secondary | ICD-10-CM | POA: Diagnosis not present

## 2019-06-06 DIAGNOSIS — Z7901 Long term (current) use of anticoagulants: Secondary | ICD-10-CM | POA: Diagnosis not present

## 2019-06-06 LAB — POCT INR: INR: 2.3 (ref 2.0–3.0)

## 2019-06-07 NOTE — Progress Notes (Signed)
Seen by Pamala Hurry for AWV

## 2019-06-11 ENCOUNTER — Telehealth: Payer: Self-pay | Admitting: Gastroenterology

## 2019-06-11 ENCOUNTER — Telehealth: Payer: Self-pay

## 2019-06-11 ENCOUNTER — Telehealth: Payer: Self-pay | Admitting: Cardiology

## 2019-06-11 ENCOUNTER — Telehealth: Payer: Self-pay | Admitting: Nurse Practitioner

## 2019-06-11 NOTE — Telephone Encounter (Signed)
New Message   Patient calling in to speak with Dr. Doug Sou Nurse Malachy Mood. Patient states that she has some questions about her medication. Patient wants to start taking an OTC medication and would like to get it approved first.

## 2019-06-11 NOTE — Telephone Encounter (Signed)
Patient called stating she wants to know if we have found her another GI provider to go to because the others doctors have refused to take her as a pt.  I HAVE RETURNED PT CALL AND ADVISED HER THAT SHE WILL NEED TO CALL AROUND AND SEE IF SOMEONE WILL TAKE HER AS A PT AND THEN LET us KNOW PT GOT UPSET AND ASKED WHY WE COULDN'T CALL AROUND AND FIND ONE FOR HER. YRL,RMA

## 2019-06-11 NOTE — Telephone Encounter (Signed)
Spoke to patient she stated she continues to have diarrhea and wanted to make sure ok to take [place] with her other medications.Advised I will send message to our pharmacist for advice.

## 2019-06-11 NOTE — Telephone Encounter (Signed)
Returned call to patient, she continues to ask for a referral to another GI provider.  She was seeing Dr. Collene Mares, and seen Dr. Lyndel Safe at [facility].  I sent a referral to Dr. Lyda Jester in [place] who declined the patient.  I have encouraged her to call a local GI provider who may be willing to take her as a patient for her persistent diarrhea.  She has a call out to [facility] to see if Dr. Tarri Glenn would take her on as a patient.  She would like to see a GI provider for further treatment.

## 2019-06-11 NOTE — Telephone Encounter (Signed)
We don't recommend Pepto Bismol because of the aspirin derivatives in it.  Would prefer she use Imodium

## 2019-06-11 NOTE — Telephone Encounter (Signed)
Returned call to patient [name]'s advice given.Stated she is waiting for a call back from her PCP.She is asking PCP to refer her to a new GI Dr.

## 2019-06-12 NOTE — Telephone Encounter (Signed)
Dr. Lyndel Safe, pt requested a female doctor and would like to transfer care to Dr. Tarri Glenn.  Will you approve the switch?

## 2019-06-12 NOTE — Telephone Encounter (Signed)
Pt called back requesting to speak with Minette Brine, pt has questions. Please advise Best contact: [phone]

## 2019-06-13 NOTE — Telephone Encounter (Signed)
Dr. Tarri Glenn, please see below.  Will you accept this pt?

## 2019-06-13 NOTE — Telephone Encounter (Signed)
Absolutely Fine by me RG

## 2019-06-14 NOTE — Telephone Encounter (Signed)
Okay 

## 2019-06-17 NOTE — Telephone Encounter (Signed)
She was awaiting a call from Dr. Tarri Glenn office and I believe she has accepted her as a patient.  Have her to call Dr. Tarri Glenn office to try and schedule.

## 2019-06-20 NOTE — Telephone Encounter (Signed)
Patient has an appointment scheduled for sept. YRL,RMA

## 2019-06-27 DIAGNOSIS — H40013 Open angle with borderline findings, low risk, bilateral: Secondary | ICD-10-CM | POA: Diagnosis not present

## 2019-07-05 ENCOUNTER — Telehealth: Payer: Self-pay | Admitting: Cardiology

## 2019-07-05 NOTE — Telephone Encounter (Signed)
° °  Patient calling ONLY to speak with Malachy Mood P. Advised patient she is not available.    Pt c/o medication issue:  1. Name of Medication: Lasix  2. How are you currently taking this medication (dosage and times per day)? n/a  3. Are you having a reaction (difficulty breathing--STAT)? no  4. What is your medication issue? Patient states she has questions about medication

## 2019-07-05 NOTE — Telephone Encounter (Signed)
Patient only wanting to speak to you. Thanks!

## 2019-07-08 ENCOUNTER — Ambulatory Visit
Admission: RE | Admit: 2019-07-08 | Discharge: 2019-07-08 | Disposition: A | Discharge status: Home / Self Care | Payer: Medicare Other | Source: Ambulatory Visit | Attending: Obstetrics and Gynecology | Admitting: Obstetrics and Gynecology

## 2019-07-08 ENCOUNTER — Other Ambulatory Visit: Payer: Self-pay

## 2019-07-08 DIAGNOSIS — E2839 Other primary ovarian failure: Secondary | ICD-10-CM

## 2019-07-08 DIAGNOSIS — M85832 Other specified disorders of bone density and structure, left forearm: Secondary | ICD-10-CM | POA: Diagnosis not present

## 2019-07-08 NOTE — Telephone Encounter (Signed)
Spoke to patient.She stated she wanted to ask Dr.Jordan if she can decrease Lasix.She takes 40 mg 1/2 tablet daily.Stated she has been urinating frequently for over 1 month.No burning or pain.Advised I will send message to [place] for advice.

## 2019-07-08 NOTE — Telephone Encounter (Signed)
Returned call to patient no answer.LMTC. 

## 2019-07-09 ENCOUNTER — Ambulatory Visit (INDEPENDENT_AMBULATORY_CARE_PROVIDER_SITE_OTHER): Payer: Medicare Other | Admitting: *Deleted

## 2019-07-09 ENCOUNTER — Other Ambulatory Visit: Payer: Self-pay

## 2019-07-09 DIAGNOSIS — Z5181 Encounter for therapeutic drug level monitoring: Secondary | ICD-10-CM | POA: Diagnosis not present

## 2019-07-09 DIAGNOSIS — I4891 Unspecified atrial fibrillation: Secondary | ICD-10-CM | POA: Diagnosis not present

## 2019-07-09 DIAGNOSIS — Z7901 Long term (current) use of anticoagulants: Secondary | ICD-10-CM | POA: Diagnosis not present

## 2019-07-09 DIAGNOSIS — I482 Chronic atrial fibrillation, unspecified: Secondary | ICD-10-CM

## 2019-07-09 LAB — POCT INR: INR: 1.6 — AB (ref 2.0–3.0)

## 2019-07-09 NOTE — Telephone Encounter (Signed)
Spoke to patient Dr.Jordan's recommendation given.Advised if she notices increased swelling or weight gain go back to normal dose.

## 2019-07-09 NOTE — Patient Instructions (Signed)
Description   Today take 1 tablet, then Continue taking 1/2 tablet daily except 1 tablet on Mondays.  Repeat INR in 2 week.

## 2019-07-09 NOTE — Telephone Encounter (Signed)
As long as she monitors herself closely for increased swelling or weight I think that would be fine  Mychelle Kendra [name] MD, [facility]

## 2019-07-10 NOTE — Telephone Encounter (Signed)
Returned call to pt she states that she would only like to talk to [place] informed that she is out today but will return tomorrow. She states that she will wait for her to return

## 2019-07-10 NOTE — Telephone Encounter (Signed)
Patient would like to speak to nurse. 

## 2019-07-11 NOTE — Telephone Encounter (Signed)
Spoke to patient she wanted to know when we will start giving flu vaccine.Advised I have not heard date yet.Stated she will get at PCP the end of this month or first of Oct.

## 2019-07-16 ENCOUNTER — Ambulatory Visit: Payer: Self-pay

## 2019-07-23 ENCOUNTER — Ambulatory Visit (INDEPENDENT_AMBULATORY_CARE_PROVIDER_SITE_OTHER): Payer: Medicare Other | Admitting: Pharmacist

## 2019-07-23 ENCOUNTER — Telehealth: Payer: Self-pay | Admitting: Cardiology

## 2019-07-23 ENCOUNTER — Ambulatory Visit: Payer: Medicare Other | Admitting: Gastroenterology

## 2019-07-23 ENCOUNTER — Other Ambulatory Visit: Payer: Self-pay

## 2019-07-23 ENCOUNTER — Ambulatory Visit: Payer: Medicare Other

## 2019-07-23 DIAGNOSIS — Z5181 Encounter for therapeutic drug level monitoring: Secondary | ICD-10-CM

## 2019-07-23 DIAGNOSIS — I482 Chronic atrial fibrillation, unspecified: Secondary | ICD-10-CM | POA: Diagnosis not present

## 2019-07-23 DIAGNOSIS — I4891 Unspecified atrial fibrillation: Secondary | ICD-10-CM | POA: Diagnosis not present

## 2019-07-23 DIAGNOSIS — Z7901 Long term (current) use of anticoagulants: Secondary | ICD-10-CM | POA: Diagnosis not present

## 2019-07-23 LAB — POCT INR: INR: 1.8 — AB (ref 2.0–3.0)

## 2019-07-23 NOTE — Telephone Encounter (Signed)
New message   Patient states that she would like for Malachy Mood to return her call about getting a prescription for medical equipment.

## 2019-07-23 NOTE — Telephone Encounter (Signed)
Patient would like to speak to you [facility]. Thank you!

## 2019-07-23 NOTE — Telephone Encounter (Signed)
Returned call to patient no answer.LMTC. 

## 2019-07-24 ENCOUNTER — Telehealth: Payer: Self-pay

## 2019-07-24 ENCOUNTER — Telehealth: Payer: Self-pay | Admitting: Nurse Practitioner

## 2019-07-24 NOTE — Chronic Care Management (AMB) (Signed)
°  Chronic Care Management   Outreach Note  07/24/2019 Name: [name] MRN: [id number] DOB: May 24, 1932  Referred by: Minette Brine, FNP Reason for referral : Chronic Care Management (Initial CCM outreach was unsuccessful. )   An unsuccessful telephone outreach was attempted today. The patient was referred to the case management team by for assistance with chronic care management and care coordination.   Follow Up Plan: A HIPPA compliant phone message was left for the patient providing contact information and requesting a return call.  The care management team will reach out to the patient again over the next 7 days.  If patient returns call to provider office, please advise to call [place] at [place]  [email or web address]   ??[phone]

## 2019-07-24 NOTE — Chronic Care Management (AMB) (Signed)
Chronic Care Management   Note  07/24/2019 Name: [name] MRN: [id number] DOB: October 02, 1932  [name] is a 85 y.o. year old female who is a primary care patient of Minette Brine, [place]. I reached out to [employer] by phone today in response to a referral sent by Ms. Enid Derry A Sima's health plan.    [name] was given information about Chronic Care Management services today including:  1. CCM service includes personalized support from designated clinical staff supervised by her physician, including individualized plan of care and coordination with other care providers 2. 24/7 contact phone numbers for assistance for urgent and routine care needs. 3. Service will only be billed when office clinical staff spend 20 minutes or more in a month to coordinate care. 4. Only one practitioner may furnish and bill the service in a calendar month. 5. The patient may stop CCM services at any time (effective at the end of the month) by phone call to the office staff. 6. The patient will be responsible for cost sharing (co-pay) of up to 20% of the service fee (after annual deductible is met).  Patient did not agree to enrollment in care management services and does not wish to consider at this time.  Follow up plan: The patient has been provided with contact information for the chronic care management team and has been advised to call with any health related questions or concerns.   [place]  [email or web address]   ??[phone]

## 2019-07-24 NOTE — Telephone Encounter (Signed)
Hey, it looks like you tried to contact this patient and I just wanted to let you know that she returned your call. YRL,RMA

## 2019-07-25 DIAGNOSIS — M545 Low back pain: Secondary | ICD-10-CM | POA: Diagnosis not present

## 2019-07-25 DIAGNOSIS — Z79891 Long term (current) use of opiate analgesic: Secondary | ICD-10-CM | POA: Diagnosis not present

## 2019-07-25 DIAGNOSIS — G894 Chronic pain syndrome: Secondary | ICD-10-CM | POA: Diagnosis not present

## 2019-07-26 NOTE — Telephone Encounter (Signed)
Spoke to patient she stated she would like a letter saying she needs a upright walker and raised commode seat.Stated medicare will pay if she sends them a letter.Dr.Jordan signed letter.Letter mailed to patient.

## 2019-07-26 NOTE — Telephone Encounter (Signed)
New Message ° ° °Patient returning your call. °

## 2019-08-01 DIAGNOSIS — N959 Unspecified menopausal and perimenopausal disorder: Secondary | ICD-10-CM | POA: Diagnosis not present

## 2019-08-01 DIAGNOSIS — Z01419 Encounter for gynecological examination (general) (routine) without abnormal findings: Secondary | ICD-10-CM | POA: Diagnosis not present

## 2019-08-01 NOTE — Telephone Encounter (Signed)
Returned call to patient.She was calling to ask if we were giving flu vaccines.Advised we give flu vaccines at time of patients office visits.Advised her follow up visit is 10/02/19.She will get flu vaccine next week with PCP.

## 2019-08-01 NOTE — Telephone Encounter (Signed)
Patient called back and said that she had the information Malachy Mood previously asked for. If you could give the patient a call she would appreciate it.

## 2019-08-07 ENCOUNTER — Other Ambulatory Visit: Payer: Self-pay | Admitting: Sports Medicine

## 2019-08-07 ENCOUNTER — Other Ambulatory Visit: Payer: Self-pay

## 2019-08-07 ENCOUNTER — Ambulatory Visit (INDEPENDENT_AMBULATORY_CARE_PROVIDER_SITE_OTHER): Payer: Medicare Other | Admitting: Sports Medicine

## 2019-08-07 ENCOUNTER — Ambulatory Visit (INDEPENDENT_AMBULATORY_CARE_PROVIDER_SITE_OTHER): Payer: Medicare Other

## 2019-08-07 ENCOUNTER — Encounter: Payer: Self-pay | Admitting: Sports Medicine

## 2019-08-07 DIAGNOSIS — M79672 Pain in left foot: Secondary | ICD-10-CM | POA: Diagnosis not present

## 2019-08-07 DIAGNOSIS — M7752 Other enthesopathy of left foot: Secondary | ICD-10-CM

## 2019-08-07 DIAGNOSIS — M779 Enthesopathy, unspecified: Secondary | ICD-10-CM

## 2019-08-07 DIAGNOSIS — L84 Corns and callosities: Secondary | ICD-10-CM | POA: Diagnosis not present

## 2019-08-07 DIAGNOSIS — M79674 Pain in right toe(s): Secondary | ICD-10-CM | POA: Diagnosis not present

## 2019-08-07 DIAGNOSIS — R200 Anesthesia of skin: Secondary | ICD-10-CM | POA: Diagnosis not present

## 2019-08-07 DIAGNOSIS — I739 Peripheral vascular disease, unspecified: Secondary | ICD-10-CM

## 2019-08-07 DIAGNOSIS — M79675 Pain in left toe(s): Secondary | ICD-10-CM | POA: Diagnosis not present

## 2019-08-07 DIAGNOSIS — B351 Tinea unguium: Secondary | ICD-10-CM

## 2019-08-07 DIAGNOSIS — M21622 Bunionette of left foot: Secondary | ICD-10-CM

## 2019-08-07 DIAGNOSIS — M79671 Pain in right foot: Secondary | ICD-10-CM

## 2019-08-07 DIAGNOSIS — D689 Coagulation defect, unspecified: Secondary | ICD-10-CM | POA: Diagnosis not present

## 2019-08-07 NOTE — Progress Notes (Signed)
Subjective: [name] is a 85 y.o. female patient who presents to office for evaluation of Left foot pain. Patient complains of progressive pain especially over the last 3 weeks at 5th toe joint with a growth to the area nut now says that it is better after using Biofreeze. Ranks pain 10/10 initially but now 2/10 with shoes. Reports also for years has had pain in her left heel.  Patient has tried changing shoes and using a heel cushion with some relief in symptoms.  Patient also is complaining of elongated nails and calluses difficult to care for.  Patient admits a history of being on blood thinner due to A. fib.  Patient denies any other pedal complaints. Denies injury/trip/fall/sprain/any causative factors.   Review of Systems  All other systems reviewed and are negative.  Patient Active Problem List   Diagnosis Date Noted  . Serum calcium elevated 01/03/2019  . Prediabetes 09/25/2018  . Vitamin D deficiency 09/04/2018  . A-fib ([place]) 09/04/2018  . Abnormal glucose 09/04/2018  . Need for influenza vaccination 08/08/2018  . Osteoarthritis of left knee 06/07/2018  . Long-term current use of opiate analgesic 11/22/2017  . Chronic low back pain 11/22/2017  . Long term (current) use of anticoagulants [Z79.01] 05/24/2017  . Menopause present 03/27/2017  . Osteopenia 03/27/2017  . Chronic bronchitis with COPD (chronic obstructive pulmonary disease) ([place]) 03/24/2016  . Left foot pain 02/21/2013  . Anticoagulant long-term use 02/25/2011  . Mitral insufficiency   . Essential hypertension   . LVH (left ventricular hypertrophy) due to hypertensive disease   . Pulmonary hypertension ([place])   . Hyperlipidemia   . Back pain, chronic   . Depression     Current Outpatient Medications on File Prior to Visit  Medication Sig Dispense Refill  . ascorbic acid (VITAMIN C) 500 MG tablet Take 500 mg by mouth daily. Takes 650 mg daily    . atorvastatin (LIPITOR) 20 MG tablet Take 1 tablet (20 mg  total) by mouth daily. 90 tablet 1  . Biotin 1000 MCG tablet Take 1 tablet by mouth daily.     . Calcium Carbonate-Vitamin D (CALCIUM + D PO) Take by mouth daily.      . Cholecalciferol (VITAMIN D PO) Take by mouth daily.      . Cyanocobalamin (VITAMIN B-12 PO) Take by mouth as directed.    . diltiazem (TIAZAC) 120 MG 24 hr capsule Take 1 capsule (120 mg total) by mouth daily. 30 capsule 9  . estrogens, conjugated, (PREMARIN) 1.25 MG tablet Take 1.25 mg by mouth daily. Takes 3 x week Mon-Wed-Fri    . folic acid (FOLVITE) 1 MG tablet Take 1 mg by mouth daily.    . furosemide (LASIX) 40 MG tablet Take 1/2 tablet 20 mg every other day 90 tablet 3  . gabapentin (NEURONTIN) 300 MG capsule Take 1 capsule by mouth daily.    Marland Kitchen HYDROcodone-acetaminophen (NORCO) 10-325 MG per tablet Take 1 tablet by mouth every 6 (six) hours as needed.    Marland Kitchen lisinopril (PRINIVIL,ZESTRIL) 20 MG tablet Take 1 tablet (20 mg total) by mouth daily. 30 tablet 6  . Misc Natural Products (LUTEIN 20 PO) Take 1 tablet by mouth daily.    . multivitamin-lutein (OCUVITE-LUTEIN) CAPS capsule Take 1 capsule by mouth daily.    . potassium chloride SA (K-DUR) 20 MEQ tablet TAKE ONE TABLET BY MOUTH TWICE DAILY 60 tablet 10  . sertraline (ZOLOFT) 50 MG tablet Take 1&1/2 tablets daily    .  valACYclovir (VALTREX) 500 MG tablet     . vitamin A 8000 UNIT capsule Take 8,000 Units by mouth daily.      . warfarin (COUMADIN) 5 MG tablet TAKE 1/2 TO 1 TABLET BY MOUTH EVERY DAY AS DIRECTED BY COUMADIN CLINIC 90 tablet 1  . [DISCONTINUED] diltiazem (CARDIZEM) 120 MG tablet Take 1 tablet (120 mg total) by mouth daily. 30 tablet 5   No current facility-administered medications on file prior to visit.     Allergies  Allergen Reactions  . Codeine Nausea And Vomiting  . Sulfanilamide   . Penicillins Rash    Objective:  General: Alert and oriented x3 in no acute distress  Dermatology: No open lesions bilateral lower extremities, no webspace  macerations, no ecchymosis bilateral, all nails x 10 are elongated and thickened with subungual debris consistent with onychomycosis.  There is reactive keratosis noted sub-met 5 bilateral and plantar heel on the left.  Vascular: Dorsalis Pedis and Posterior Tibial pedal pulses faintly palpable bilateral, capillary Fill Time 5 seconds,(-) pedal hair growth bilateral, trace edema bilateral lower extremities, severe varicosities bilateral, temperature gradient within normal limits.  Neurology: Gross sensation intact via light touch bilateral, (- )Tinels sign bilateral.  Patient admits chronic numbness every since she had a biopsy procedure done on her legs.  Musculoskeletal: Mild tenderness with palpation at fifth metatarsal phalangeal joint on the left foot with prominent fifth metatarsal head consistent with bunionette, there is significant fat pad atrophy plantarly at the ball and the heel, no pain with calf compression bilateral. Strength within normal limits in all groups bilateral for patient status.   Gait: Minimally antalgic gait  Xrays  Left foot   Impression: Normal osseous mineralization there is mild enlargement of the fifth metatarsal head and intermetatarsal angle supportive of bunionette deformity, no other acute findings.  Assessment and Plan: Problem List Items Addressed This Visit    None    Visit Diagnoses    Capsulitis    -  Primary   Bunionette of left foot       Inflammatory pain of left heel       PVD (peripheral vascular disease) (HCC)       Numbness       Foot pain, left       Pain due to onychomycosis of toenails of both feet       Callus of foot       Coagulopathy (HCC)           -Complete examination performed -Xrays reviewed -Discussed treatment options for capsulitis and pain secondary to elongated nails and calluses -Advised patient to continue with Biofreeze to left fifth metatarsophalangeal joint -Continue with heel cushions bilateral -Dispensed  foam toe cushion for patient to use over the left fifth metatarsophalangeal joint when in shoes -Mechanically debrided nails x10 using sterile nail nipper without incident and smoothed callus using rotary bur at no additional charge we will get an ABN next visit -Patient to return to office in 3 months for routine foot care to get ABN at that visit.  Titorya Stover, DPM  

## 2019-08-08 DIAGNOSIS — M25551 Pain in right hip: Secondary | ICD-10-CM | POA: Diagnosis not present

## 2019-08-08 DIAGNOSIS — Z96643 Presence of artificial hip joint, bilateral: Secondary | ICD-10-CM | POA: Diagnosis not present

## 2019-08-08 DIAGNOSIS — M7061 Trochanteric bursitis, right hip: Secondary | ICD-10-CM | POA: Diagnosis not present

## 2019-08-08 DIAGNOSIS — M7062 Trochanteric bursitis, left hip: Secondary | ICD-10-CM | POA: Diagnosis not present

## 2019-08-12 DIAGNOSIS — Z23 Encounter for immunization: Secondary | ICD-10-CM | POA: Diagnosis not present

## 2019-08-13 ENCOUNTER — Other Ambulatory Visit: Payer: Self-pay

## 2019-08-13 ENCOUNTER — Ambulatory Visit (INDEPENDENT_AMBULATORY_CARE_PROVIDER_SITE_OTHER): Payer: Medicare Other | Admitting: Pharmacist

## 2019-08-13 DIAGNOSIS — I4891 Unspecified atrial fibrillation: Secondary | ICD-10-CM

## 2019-08-13 DIAGNOSIS — Z7901 Long term (current) use of anticoagulants: Secondary | ICD-10-CM | POA: Diagnosis not present

## 2019-08-13 DIAGNOSIS — R350 Frequency of micturition: Secondary | ICD-10-CM | POA: Diagnosis not present

## 2019-08-13 DIAGNOSIS — I482 Chronic atrial fibrillation, unspecified: Secondary | ICD-10-CM

## 2019-08-13 DIAGNOSIS — Z5181 Encounter for therapeutic drug level monitoring: Secondary | ICD-10-CM | POA: Diagnosis not present

## 2019-08-13 DIAGNOSIS — N311 Reflex neuropathic bladder, not elsewhere classified: Secondary | ICD-10-CM | POA: Diagnosis not present

## 2019-08-13 LAB — POCT INR: INR: 2.9 (ref 2.0–3.0)

## 2019-08-15 ENCOUNTER — Encounter: Payer: Self-pay | Admitting: Nurse Practitioner

## 2019-08-15 DIAGNOSIS — R12 Heartburn: Secondary | ICD-10-CM | POA: Diagnosis not present

## 2019-08-15 DIAGNOSIS — K580 Irritable bowel syndrome with diarrhea: Secondary | ICD-10-CM | POA: Diagnosis not present

## 2019-08-15 DIAGNOSIS — R1013 Epigastric pain: Secondary | ICD-10-CM | POA: Diagnosis not present

## 2019-08-15 DIAGNOSIS — Z23 Encounter for immunization: Secondary | ICD-10-CM | POA: Diagnosis not present

## 2019-08-16 ENCOUNTER — Telehealth: Payer: Self-pay | Admitting: Cardiology

## 2019-08-16 NOTE — Telephone Encounter (Signed)
Returned call to patient she stated she wanted to ask Dr.Jordan if ok to take Myrbetriq 50 mg daily that Dr.Ottlelin prescribed.I will send message to [place] for advice.

## 2019-08-16 NOTE — Telephone Encounter (Signed)
New Message ° °Patient is returning call. Please give patient a call back.  °

## 2019-08-16 NOTE — Telephone Encounter (Signed)
Spoke to patient Dr.Jordan advised ok to take Myrbetriq.

## 2019-08-16 NOTE — Telephone Encounter (Signed)
Yes this is OK  Peter Jordan MD, FACC  

## 2019-08-19 ENCOUNTER — Encounter: Payer: Self-pay | Admitting: Nurse Practitioner

## 2019-08-26 ENCOUNTER — Other Ambulatory Visit: Payer: Self-pay

## 2019-08-26 ENCOUNTER — Other Ambulatory Visit: Payer: Medicare Other

## 2019-08-26 DIAGNOSIS — R7303 Prediabetes: Secondary | ICD-10-CM | POA: Diagnosis not present

## 2019-08-26 DIAGNOSIS — E785 Hyperlipidemia, unspecified: Secondary | ICD-10-CM | POA: Diagnosis not present

## 2019-08-26 DIAGNOSIS — I10 Essential (primary) hypertension: Secondary | ICD-10-CM | POA: Diagnosis not present

## 2019-08-27 ENCOUNTER — Other Ambulatory Visit: Payer: Self-pay

## 2019-08-27 LAB — CMP14 + ANION GAP
ALT: 53 IU/L — ABNORMAL HIGH (ref 0–32)
AST: 45 IU/L — ABNORMAL HIGH (ref 0–40)
Albumin/Globulin Ratio: 1.6 (ref 1.2–2.2)
Albumin: 4.6 g/dL (ref 3.6–4.6)
Alkaline Phosphatase: 59 IU/L (ref 39–117)
Anion Gap: 16 mmol/L (ref 10.0–18.0)
BUN/Creatinine Ratio: 23 (ref 12–28)
BUN: 24 mg/dL (ref 8–27)
Bilirubin Total: 0.4 mg/dL (ref 0.0–1.2)
CO2: 25 mmol/L (ref 20–29)
Calcium: 11.7 mg/dL — ABNORMAL HIGH (ref 8.7–10.3)
Chloride: 101 mmol/L (ref 96–106)
Creatinine, Ser: 1.06 mg/dL — ABNORMAL HIGH (ref 0.57–1.00)
GFR calc Af Amer: 55 mL/min/{1.73_m2} — ABNORMAL LOW (ref >59)
GFR calc non Af Amer: 48 mL/min/{1.73_m2} — ABNORMAL LOW (ref >59)
Globulin, Total: 2.8 g/dL (ref 1.5–4.5)
Glucose: 106 mg/dL — ABNORMAL HIGH (ref 65–99)
Potassium: 4.1 mmol/L (ref 3.5–5.2)
Sodium: 142 mmol/L (ref 134–144)
Total Protein: 7.4 g/dL (ref 6.0–8.5)

## 2019-08-27 LAB — LIPID PANEL
Chol/HDL Ratio: 2.8 ratio (ref 0.0–4.4)
Cholesterol, Total: 202 mg/dL — ABNORMAL HIGH (ref 100–199)
HDL: 72 mg/dL (ref >39)
LDL Chol Calc (NIH): 105 mg/dL — ABNORMAL HIGH (ref 0–99)
Triglycerides: 144 mg/dL (ref 0–149)
VLDL Cholesterol Cal: 25 mg/dL (ref 5–40)

## 2019-08-27 LAB — HEMOGLOBIN A1C
Est. average glucose Bld gHb Est-mCnc: 123 mg/dL
Hgb A1c MFr Bld: 5.9 % — ABNORMAL HIGH (ref 4.8–5.6)

## 2019-08-27 MED ORDER — LISINOPRIL 20 MG PO TABS: 20.0000 mg | ORAL_TABLET | Freq: Every day | ORAL | 0 refills | Status: DC

## 2019-09-02 ENCOUNTER — Telehealth: Payer: Self-pay | Admitting: Cardiology

## 2019-09-02 NOTE — Telephone Encounter (Signed)
  Patient would like to see if Dr [name] can write her a letter to have her excused from jury duty

## 2019-09-03 NOTE — Telephone Encounter (Signed)
Returned call to patient 10/26.She stated she needed a letter to excuse her from jury duty.Stated she needs letter by Friday 09/06/19.Letter mailed 09/03/19.

## 2019-09-06 ENCOUNTER — Telehealth: Payer: Self-pay | Admitting: Cardiology

## 2019-09-06 NOTE — Telephone Encounter (Signed)
° ° °  Patient requesting call regarding information sent to her.

## 2019-09-06 NOTE — Telephone Encounter (Signed)
Returned call to patient she wanted me to know she received her letter to excuse her from jury duty.She was very appreciative of letter.

## 2019-09-10 DIAGNOSIS — Z7901 Long term (current) use of anticoagulants: Secondary | ICD-10-CM | POA: Diagnosis not present

## 2019-09-11 ENCOUNTER — Telehealth: Payer: Self-pay | Admitting: Cardiology

## 2019-09-11 DIAGNOSIS — R1013 Epigastric pain: Secondary | ICD-10-CM | POA: Diagnosis not present

## 2019-09-11 DIAGNOSIS — K209 Esophagitis, unspecified without bleeding: Secondary | ICD-10-CM | POA: Diagnosis not present

## 2019-09-11 DIAGNOSIS — R1310 Dysphagia, unspecified: Secondary | ICD-10-CM | POA: Diagnosis not present

## 2019-09-11 DIAGNOSIS — R12 Heartburn: Secondary | ICD-10-CM | POA: Diagnosis not present

## 2019-09-11 DIAGNOSIS — K2100 Gastro-esophageal reflux disease with esophagitis, without bleeding: Secondary | ICD-10-CM | POA: Diagnosis not present

## 2019-09-11 NOTE — Telephone Encounter (Signed)
Spoke with patient and she wants to speak with Malachy Mood ONLY Will forward

## 2019-09-11 NOTE — Telephone Encounter (Signed)
Spoke to patient she stated she wanted Dr.Jordan to know she had a UGI this morning with Dr.Butler in [place].Stated she was started on Protonix 40 mg daily.She has been having a lot of heart burn.Advised I will make Dr.Jordan aware.

## 2019-09-11 NOTE — Telephone Encounter (Signed)
New Message   Patient states that she has some medical information that she needs to provide to [place]. That is all the information that the patient was willing to provide

## 2019-09-17 ENCOUNTER — Ambulatory Visit (INDEPENDENT_AMBULATORY_CARE_PROVIDER_SITE_OTHER): Payer: Medicare Other | Admitting: Pharmacist

## 2019-09-17 ENCOUNTER — Other Ambulatory Visit: Payer: Self-pay

## 2019-09-17 DIAGNOSIS — Z7901 Long term (current) use of anticoagulants: Secondary | ICD-10-CM | POA: Diagnosis not present

## 2019-09-17 DIAGNOSIS — I482 Chronic atrial fibrillation, unspecified: Secondary | ICD-10-CM

## 2019-09-17 DIAGNOSIS — Z5181 Encounter for therapeutic drug level monitoring: Secondary | ICD-10-CM | POA: Diagnosis not present

## 2019-09-17 DIAGNOSIS — I4891 Unspecified atrial fibrillation: Secondary | ICD-10-CM

## 2019-09-17 LAB — POCT INR: INR: 2 (ref 2.0–3.0)

## 2019-09-27 ENCOUNTER — Telehealth: Payer: Self-pay | Admitting: Cardiology

## 2019-09-27 NOTE — Telephone Encounter (Signed)
New message  Patient is calling in asking to speak with Malachy Mood. Patient states that she has a question for her. Offered to assist, and patient states that she was told by Malachy Mood that anytime she has a question to ask to speak with Malachy Mood. Please give patient a call back to assist.

## 2019-09-27 NOTE — Telephone Encounter (Signed)
Returned call to pt she states that she was wondering if she should have COVID testing she does not have any sx. Informed pt that she could go to [employer] site community testing site. Will forward to [place] per pt request

## 2019-09-29 NOTE — Progress Notes (Signed)
[name] Date of Birth: 01/09/1932   History of Present Illness: [name] is seen for follow up Afib. She has a history of chronic atrial fibrillation, hypertensive heart disease, and pulmonary hypertension. She has a history of chronic back problems. She has moderate MR and COPD. In October she developed progressive weakness in her legs. We held her lipitor without improvement. MRI was done. This showed some chronic fatty atrophy of the muscles c/w denervation in the peroneal nerve distribution. We had discussed switching to a DOAC but she could not afford it.   On follow up today she is doing pretty well. She walks around with a walker. She states her breathing is a little more labored. Not eating as healthy. Eats a lot of dairy.  No chest pain. No edema.     Current Outpatient Medications on File Prior to Visit  Medication Sig Dispense Refill  . ascorbic acid (VITAMIN C) 500 MG tablet Take 500 mg by mouth daily. Takes 650 mg daily    . atorvastatin (LIPITOR) 20 MG tablet Take 1 tablet (20 mg total) by mouth daily. 90 tablet 1  . Biotin 1000 MCG tablet Take 1 tablet by mouth daily.     . Calcium Carbonate-Vitamin D (CALCIUM + D PO) Take by mouth daily.      . Cholecalciferol (VITAMIN D PO) Take by mouth daily.      . Cyanocobalamin (VITAMIN B-12 PO) Take by mouth as directed.    . diltiazem (TIAZAC) 120 MG 24 hr capsule Take 1 capsule (120 mg total) by mouth daily. 30 capsule 9  . estrogens, conjugated, (PREMARIN) 1.25 MG tablet Take 1.25 mg by mouth daily. Takes 3 x week Mon-Wed-Fri    . folic acid (FOLVITE) 1 MG tablet Take 1 mg by mouth daily.    . furosemide (LASIX) 40 MG tablet Take 1/2 tablet 20 mg every other day 90 tablet 3  . gabapentin (NEURONTIN) 300 MG capsule Take 1 capsule by mouth daily.    Marland Kitchen HYDROcodone-acetaminophen (NORCO) 10-325 MG per tablet Take 1 tablet by mouth every 6 (six) hours as needed.    Marland Kitchen lisinopril (ZESTRIL) 20 MG tablet Take 1 tablet (20  mg total) by mouth daily. 90 tablet 0  . mirabegron ER (MYRBETRIQ) 50 MG TB24 tablet Take 50 mg by mouth daily.    . Misc Natural Products (LUTEIN 20 PO) Take 1 tablet by mouth daily.    . multivitamin-lutein (OCUVITE-LUTEIN) CAPS capsule Take 1 capsule by mouth daily.    . pantoprazole (PROTONIX) 20 MG tablet Take 20 mg by mouth daily.    . potassium chloride SA (K-DUR) 20 MEQ tablet TAKE ONE TABLET BY MOUTH TWICE DAILY 60 tablet 10  . sertraline (ZOLOFT) 50 MG tablet Take 1&1/2 tablets daily    . valACYclovir (VALTREX) 500 MG tablet     . vitamin A 8000 UNIT capsule Take 8,000 Units by mouth daily.      Marland Kitchen warfarin (COUMADIN) 5 MG tablet TAKE 1/2 TO 1 TABLET BY MOUTH EVERY DAY AS DIRECTED BY COUMADIN CLINIC 90 tablet 1  . [DISCONTINUED] diltiazem (CARDIZEM) 120 MG tablet Take 1 tablet (120 mg total) by mouth daily. 30 tablet 5   No current facility-administered medications on file prior to visit.     Allergies  Allergen Reactions  . Codeine Nausea And Vomiting  . Sulfanilamide   . Penicillins Rash    Past Medical History:  Diagnosis Date  . Anemia   . Anticoagulated  on Coumadin    CHRONIC  . Arthritis   . Back pain, chronic   . Chronic atrial fibrillation ([place])   . Coronary artery disease CARDIOLOGIST- DR [name]--  LOV IN EPIC  . Depression   . Hammer toe   . Hypercholesterolemia   . Hypertension   . Hypertensive heart disease    WITH LVH  . LVH (left ventricular hypertrophy) due to hypertensive disease   . Mitral insufficiency    CHRONIC  . Pulmonary hypertension ([place])   . Transfusion history     Past Surgical History:  Procedure Laterality Date  . ABDOMINAL HYSTERECTOMY    . ANTERIOR REPAIR/ [place] PUBOVAGINAL SLING  05-03-2002  . BUNIONECTOMY WITH HAMMERTOE RECONSTRUCTION Right 03/01/2013   Procedure: RIGHT FOOT EXCISION BUNIONETTE AND FUSION OF DIP FOURTH TOE;  Surgeon: [name], MD;  Location: [facility];  Service: Orthopedics;   Laterality: Right;  . CARDIAC CATHETERIZATION  02-03-2009  DR  [name]   SINGLE-VESSEL OBSTRUCTIVE CAD, SMALL DIAGONAL BRANCH/ NORMAL LVF/ SEVERE MITRAL INSUFFICIENCY/ SEVERE PULMONARY HYPERTENSION  . CATARACT EXTRACTION W/ INTRAOCULAR LENS  IMPLANT, BILATERAL  2003  . COLONOSCOPY  07/25/2014   few mall scattered diverticula in the ectire examined colon. Small internal hemmorhoids.   Marland Kitchen HAMMER TOE SURGERY  02/28/2012   Procedure: HAMMER TOE CORRECTION;  Surgeon: [name], MD;  Location: [facility];  Service: Orthopedics;  Laterality: Right;  FUSION OF PROXIMAL INTERPHALANGEAL  RIGHT THIRD CLAW TOE  . PARS PLANA VITRECTOMY W/ REPAIR OF MACULAR HOLE  02-04-2002   LEFT EYE  . REPAIR RIGHT SECOND CLAW TOE/ VARUS MEDIAL CLOSING WEDGE OSTEOTOMY PROXIMAL PHALANX RIGHT GREAT TOE  05-05-2005  . TOTAL HIP ARTHROPLASTY     BILATERAL---  LEFT 4/98;   RIGHT  11/98  . VARUS OSTEOTOMY PROXIMAL PHALANX, LEFT GREAT TOE/ PARING DOWN THE CALLUS , SECOND LEFT TOE  10-27-2008    Social History   Tobacco Use  Smoking Status Never Smoker  Smokeless Tobacco Never Used    Social History   Substance and Sexual Activity  Alcohol Use Not Currently   Comment: occasional    Family History  Problem Relation Age of Onset  . Heart disease Mother 22  . Prostate cancer Father 3    Review of Systems:  As noted in history of present illness.  All other systems were reviewed and are negative.  Physical Exam: BP (!) 144/70 (BP Location: Right Arm, Cuff Size: Normal)   Pulse 98   Ht 5\' 4"  (1.626 m)   Wt 164 lb 12.8 oz (74.8 kg)   SpO2 92%   BMI 28.29 kg/m  GENERAL:  Well appearing WF in NAD HEENT:  PERRL, EOMI, sclera are clear. Oropharynx is clear. NECK:  No jugular venous distention, carotid upstroke brisk and symmetric, no bruits, no thyromegaly or adenopathy LUNGS:  Clear to auscultation bilaterally CHEST:  Unremarkable HEART:  RRR,  PMI not displaced or sustained,S1 and S2  within normal limits, no S3, no S4: no clicks, no rubs, gr [id number] systolic murmur apex.  ABD:  Soft, nontender. BS +, no masses or bruits. No hepatomegaly, no splenomegaly EXT:  2 + pulses throughout, no edema, venous varicosities.  SKIN:  Warm and dry.  No rashes NEURO:  Alert and oriented x 3. Cranial nerves II through XII intact. PSYCH:  Cognitively intact     LABORATORY DATA:  Lab Results  Component Value Date   WBC 4.6 03/27/2018   HGB 12.6  03/27/2018   HCT 38.6 03/27/2018   PLT 356 03/27/2018   GLUCOSE 106 (H) 08/26/2019   CHOL 202 (H) 08/26/2019   TRIG 144 08/26/2019   HDL 72 08/26/2019   LDLCALC 105 (H) 08/26/2019   ALT 53 (H) 08/26/2019   AST 45 (H) 08/26/2019   NA 142 08/26/2019   K 4.1 08/26/2019   CL 101 08/26/2019   CREATININE 1.06 (H) 08/26/2019   BUN 24 08/26/2019   CO2 25 08/26/2019   TSH 1.170 03/26/2014   INR 2.0 09/17/2019   HGBA1C 5.9 (H) 08/26/2019   MICROALBUR 30 05/08/2019    Labs reviewed from 02/24/17: cholesterol 174, triglycerides 110, LDL 90, HDL 62. A1c 6.0%. CMET and TSH normal. Dated 08/24/17: cholesterol 157, triglycerides 106, HDL 55, LDL 90. A1c 6.1%. CMET and CBC normal  Ecg today shows Afib with rate 98. Septal Q waves. No change. I have personally reviewed and interpreted this study.   Assessment / Plan: 1. Atrial fibrillation, permanent. Rate is well controlled on diltiazem. She is anticoagulated on Coumadin. INR therapeutic.  2. Mild to Moderate MR. Last Echo 8/16. Asymptomatic. Exam is unchanged.   3. Hypertension with hypertensive heart disease, BP well controlled.  4. Hyperlipidemia on statin therapy. Last LDL increased due to dietary changes. Encourage healthy eating.   5. Pulmonary hypertension-moderate  6. Coronary disease. Cardiac catheterization in 2010 showed severe stenosis in a small diagonal branch. She otherwise had no significant disease. She is asymptomatic.   7. Chronic bronchitis and COPD. Some dyspnea.  Doesn't want to try inhaler.   Follow up in 6 months.

## 2019-09-30 NOTE — Telephone Encounter (Signed)
Returned call to patient she stated she will ask Dr.Jordan at her appointment 11/25 if she needs to be tested for covid.Stated she does not have any symptoms.She is concerned some people don't have symptoms.She will get his advice then.

## 2019-09-30 NOTE — Telephone Encounter (Signed)
Patient returning Cheryls call.

## 2019-09-30 NOTE — Telephone Encounter (Signed)
Returned call to patient no answer.LMTC. 

## 2019-10-02 ENCOUNTER — Other Ambulatory Visit: Payer: Self-pay

## 2019-10-02 ENCOUNTER — Ambulatory Visit (INDEPENDENT_AMBULATORY_CARE_PROVIDER_SITE_OTHER): Payer: Medicare Other | Admitting: Cardiology

## 2019-10-02 ENCOUNTER — Encounter: Payer: Self-pay | Admitting: Cardiology

## 2019-10-02 VITALS — BP 144/70 | HR 98 | Ht 64.0 in | Wt 164.8 lb

## 2019-10-02 DIAGNOSIS — I482 Chronic atrial fibrillation, unspecified: Secondary | ICD-10-CM | POA: Diagnosis not present

## 2019-10-02 DIAGNOSIS — Z7901 Long term (current) use of anticoagulants: Secondary | ICD-10-CM

## 2019-10-02 DIAGNOSIS — I272 Pulmonary hypertension, unspecified: Secondary | ICD-10-CM

## 2019-10-02 DIAGNOSIS — I10 Essential (primary) hypertension: Secondary | ICD-10-CM | POA: Diagnosis not present

## 2019-10-14 ENCOUNTER — Telehealth: Payer: Self-pay | Admitting: Cardiology

## 2019-10-14 NOTE — Telephone Encounter (Signed)
Spoke to patient she stated she is having swelling in left lower leg.No pain.Spoke to [place] he advised ok to increase Lasix to 40 mg every other day until swelling goes down then return to normal dose 20 mg every other day.Advised to call back if needed.

## 2019-10-14 NOTE — Telephone Encounter (Signed)
New Message   Pt is calling to speak with [name] and would like for [name] to call her back    Please call

## 2019-10-15 ENCOUNTER — Telehealth: Payer: Self-pay

## 2019-10-15 NOTE — Telephone Encounter (Signed)
lmom to move appt as the [place] office will close coumadin 12/22 

## 2019-10-22 ENCOUNTER — Other Ambulatory Visit: Payer: Self-pay

## 2019-10-22 ENCOUNTER — Ambulatory Visit (INDEPENDENT_AMBULATORY_CARE_PROVIDER_SITE_OTHER): Payer: Medicare Other | Admitting: Pharmacist Clinician (PhC)/ Clinical Pharmacy Specialist

## 2019-10-22 DIAGNOSIS — I482 Chronic atrial fibrillation, unspecified: Secondary | ICD-10-CM

## 2019-10-22 DIAGNOSIS — I4891 Unspecified atrial fibrillation: Secondary | ICD-10-CM

## 2019-10-22 DIAGNOSIS — Z5181 Encounter for therapeutic drug level monitoring: Secondary | ICD-10-CM | POA: Diagnosis not present

## 2019-10-22 DIAGNOSIS — Z7901 Long term (current) use of anticoagulants: Secondary | ICD-10-CM

## 2019-10-22 DIAGNOSIS — K2100 Gastro-esophageal reflux disease with esophagitis, without bleeding: Secondary | ICD-10-CM | POA: Diagnosis not present

## 2019-10-22 LAB — POCT INR: INR: 2.8 (ref 2.0–3.0)

## 2019-10-25 ENCOUNTER — Telehealth: Payer: Self-pay | Admitting: Cardiology

## 2019-10-25 NOTE — Telephone Encounter (Signed)
Patient states she received a call from our office. Believes it may have been Malachy Mood, I didn't see a note where a call was placed today.

## 2019-10-25 NOTE — Telephone Encounter (Signed)
Spoke with pt and advised no documentation noted re: anyone attempting to contact her today.  Pt verbalized understanding and states she thought it might have been Dr Doug Sou nurse, Malachy Mood.  Pt states she did call Malachy Mood earlier and left her a message to contact her.  Pt denies immediate concerns that need to be addressed and states she will wait for Cheryl's callback if not today then Monday.

## 2019-10-25 NOTE — Telephone Encounter (Signed)
Spoke to patient she stated she is having swelling in left lower leg and foot.Sob only if she over exerts.She has been eating more potato chips.Advised of a low salt diet.She will wear compression socks.Advised to call back if swelling continues.

## 2019-11-13 ENCOUNTER — Ambulatory Visit (INDEPENDENT_AMBULATORY_CARE_PROVIDER_SITE_OTHER): Payer: Medicare Other | Admitting: Nurse Practitioner

## 2019-11-13 ENCOUNTER — Encounter: Payer: Self-pay | Admitting: Nurse Practitioner

## 2019-11-13 ENCOUNTER — Other Ambulatory Visit: Payer: Self-pay

## 2019-11-13 VITALS — BP 138/78 | HR 70 | Temp 98.5°F | Ht 60.4 in | Wt 169.8 lb

## 2019-11-13 DIAGNOSIS — R2242 Localized swelling, mass and lump, left lower limb: Secondary | ICD-10-CM | POA: Diagnosis not present

## 2019-11-13 DIAGNOSIS — I272 Pulmonary hypertension, unspecified: Secondary | ICD-10-CM

## 2019-11-13 DIAGNOSIS — R7303 Prediabetes: Secondary | ICD-10-CM | POA: Diagnosis not present

## 2019-11-13 DIAGNOSIS — I739 Peripheral vascular disease, unspecified: Secondary | ICD-10-CM | POA: Diagnosis not present

## 2019-11-13 DIAGNOSIS — I482 Chronic atrial fibrillation, unspecified: Secondary | ICD-10-CM | POA: Diagnosis not present

## 2019-11-13 DIAGNOSIS — J449 Chronic obstructive pulmonary disease, unspecified: Secondary | ICD-10-CM

## 2019-11-13 NOTE — Progress Notes (Signed)
Subjective:     Patient ID: [name] , female    DOB: 07/13/1932 , 86 y.o.   MRN: [id number]   Chief Complaint  Patient presents with  . prediabetes    HPI  She has been to see Dr. Melina Copa for her persistent diarrhea.  She had an endoscopy which was normal, she is planning to call him since she continues to have the diarrha.    She has been dealing with left lower extremity swelling for the last 3 months.   Hypertension This is a chronic problem. The current episode started more than 1 year ago. The problem is unchanged. The problem is controlled. Pertinent negatives include no chest pain, headaches, palpitations or shortness of breath. There are no associated agents to hypertension. Risk factors for coronary artery disease include sedentary lifestyle.  Diabetes She presents for her follow-up diabetic visit. Diabetes type: prediabetes. Pertinent negatives for hypoglycemia include no confusion, dizziness, headaches or nervousness/anxiousness. There are no diabetic associated symptoms. Pertinent negatives for diabetes include no chest pain, no fatigue and no weakness. There are no hypoglycemic complications. Symptoms are stable. There are no diabetic complications. She is compliant with treatment all of the time.     Past Medical History:  Diagnosis Date  . Anemia   . Anticoagulated on Coumadin    CHRONIC  . Arthritis   . Back pain, chronic   . Chronic atrial fibrillation ([place])   . Coronary artery disease CARDIOLOGIST- DR [name]--  LOV IN EPIC  . Depression   . Hammer toe   . Hypercholesterolemia   . Hypertension   . Hypertensive heart disease    WITH LVH  . LVH (left ventricular hypertrophy) due to hypertensive disease   . Mitral insufficiency    CHRONIC  . Pulmonary hypertension ([place])   . Transfusion history      Family History  Problem Relation Age of Onset  . Heart disease Mother 58  . Prostate cancer Father 81     Current Outpatient Medications:  .   ascorbic acid (VITAMIN C) 500 MG tablet, Take 500 mg by mouth daily. Takes 650 mg daily, Disp: , Rfl:  .  atorvastatin (LIPITOR) 20 MG tablet, Take 1 tablet (20 mg total) by mouth daily., Disp: 90 tablet, Rfl: 1 .  Biotin 1000 MCG tablet, Take 1 tablet by mouth daily. , Disp: , Rfl:  .  Calcium Carbonate-Vitamin D (CALCIUM + D PO), Take by mouth daily.  , Disp: , Rfl:  .  Cholecalciferol (VITAMIN D PO), Take by mouth daily.  , Disp: , Rfl:  .  Cyanocobalamin (VITAMIN B-12 PO), Take by mouth as directed., Disp: , Rfl:  .  diltiazem (TIAZAC) 120 MG 24 hr capsule, Take 1 capsule (120 mg total) by mouth daily., Disp: 30 capsule, Rfl: 9 .  estrogens, conjugated, (PREMARIN) 1.25 MG tablet, Take 1.25 mg by mouth daily. Takes 3 x week Mon-Wed-Fri, Disp: , Rfl:  .  folic acid (FOLVITE) 1 MG tablet, Take 1 mg by mouth daily., Disp: , Rfl:  .  furosemide (LASIX) 40 MG tablet, Take 1/2 tablet 20 mg every other day, Disp: 90 tablet, Rfl: 3 .  gabapentin (NEURONTIN) 300 MG capsule, Take 1 capsule by mouth daily., Disp: , Rfl:  .  HYDROcodone-acetaminophen (NORCO) 10-325 MG per tablet, Take 1 tablet by mouth every 6 (six) hours as needed., Disp: , Rfl:  .  lisinopril (ZESTRIL) 20 MG tablet, Take 1 tablet (20 mg total) by mouth daily., Disp:  90 tablet, Rfl: 0 .  mirabegron ER (MYRBETRIQ) 50 MG TB24 tablet, Take 50 mg by mouth daily., Disp: , Rfl:  .  Misc Natural Products (LUTEIN 20 PO), Take 1 tablet by mouth daily., Disp: , Rfl:  .  multivitamin-lutein (OCUVITE-LUTEIN) CAPS capsule, Take 1 capsule by mouth daily., Disp: , Rfl:  .  pantoprazole (PROTONIX) 20 MG tablet, Take 20 mg by mouth daily., Disp: , Rfl:  .  potassium chloride SA (K-DUR) 20 MEQ tablet, TAKE ONE TABLET BY MOUTH TWICE DAILY, Disp: 60 tablet, Rfl: 10 .  sertraline (ZOLOFT) 50 MG tablet, Take 1&1/2 tablets daily, Disp: , Rfl:  .  valACYclovir (VALTREX) 500 MG tablet, , Disp: , Rfl:  .  vitamin A 8000 UNIT capsule, Take 8,000 Units by mouth  daily.  , Disp: , Rfl:  .  warfarin (COUMADIN) 5 MG tablet, TAKE 1/2 TO 1 TABLET BY MOUTH EVERY DAY AS DIRECTED BY COUMADIN CLINIC, Disp: 90 tablet, Rfl: 1   Allergies  Allergen Reactions  . Codeine Nausea And Vomiting  . Sulfanilamide   . Penicillins Rash     Review of Systems  Constitutional: Negative.  Negative for fatigue.  Eyes: Negative for visual disturbance.  Respiratory: Negative.  Negative for shortness of breath.   Cardiovascular: Negative.  Negative for chest pain, palpitations and leg swelling.  Gastrointestinal: Negative.   Endocrine: Negative.   Musculoskeletal: Negative.   Skin: Negative.   Neurological: Negative for dizziness, weakness and headaches.  Psychiatric/Behavioral: Negative for confusion. The patient is not nervous/anxious.      Today's Vitals   11/13/19 1050  BP: 138/78  Pulse: 70  Temp: 98.5 F (36.9 C)  TempSrc: Oral  Weight: 169 lb 12.8 oz (77 kg)  Height: 5' 0.4" (1.534 m)  PainSc: 0-No pain   Body mass index is 32.72 kg/m.   Objective:  Physical Exam Vitals reviewed.  Constitutional:      Appearance: She is well-developed.  HENT:     Head: Normocephalic and atraumatic.  Eyes:     Pupils: Pupils are equal, round, and reactive to light.  Cardiovascular:     Rate and Rhythm: Normal rate. Rhythm irregular.     Pulses: Normal pulses.     Heart sounds: Murmur present.  Pulmonary:     Effort: Pulmonary effort is normal.     Breath sounds: Normal breath sounds.  Musculoskeletal:        General: Normal range of motion.  Skin:    General: Skin is warm and dry.     Capillary Refill: Capillary refill takes less than 2 seconds.  Neurological:     General: No focal deficit present.     Mental Status: She is alert and oriented to person, place, and time.     Cranial Nerves: No cranial nerve deficit.  Psychiatric:        Mood and Affect: Mood normal.         Assessment And Plan:   1. Prediabetes  Chronic, stable.  Continue  with current medications  Encouraged to limit intake of sugary foods and drinks  Encouraged to increase physical activity to 150 minutes per week as tolerated  2. Pulmonary hypertension (HCC)  Chronic, stable  Continue with current medications  3. PVD (peripheral vascular disease) ([place])  Chronic, she is taking coumadin for her atrial fibrillation  4. Chronic bronchitis with COPD (chronic obstructive pulmonary disease) ([place])  She does not take any medications for this   Will occasionally have a  cough in morning but clears.  5. Chronic atrial fibrillation ([place])  Continue with follow up with Cardiologist  6. Localized swelling of left lower extremity  Left lower extremity with slight swelling   I have advised her to wear compression socks  She is also to follow up with cardiology as they have been following this issue prior to her visit today.     Minette Brine, FNP

## 2019-11-15 ENCOUNTER — Ambulatory Visit: Payer: Medicare Other | Admitting: Sports Medicine

## 2019-11-20 DIAGNOSIS — M545 Low back pain: Secondary | ICD-10-CM | POA: Diagnosis not present

## 2019-11-20 DIAGNOSIS — Z79899 Other long term (current) drug therapy: Secondary | ICD-10-CM | POA: Diagnosis not present

## 2019-11-20 DIAGNOSIS — Z79891 Long term (current) use of opiate analgesic: Secondary | ICD-10-CM | POA: Diagnosis not present

## 2019-11-20 DIAGNOSIS — G894 Chronic pain syndrome: Secondary | ICD-10-CM | POA: Diagnosis not present

## 2019-11-25 ENCOUNTER — Other Ambulatory Visit: Payer: Self-pay | Admitting: Cardiology

## 2019-11-25 ENCOUNTER — Other Ambulatory Visit: Payer: Self-pay

## 2019-11-25 MED ORDER — LISINOPRIL 20 MG PO TABS: 20.0000 mg | ORAL_TABLET | Freq: Every day | ORAL | 0 refills | Status: DC

## 2019-11-26 NOTE — Telephone Encounter (Signed)
Rx(s) sent to pharmacy electronically.  

## 2019-11-28 DIAGNOSIS — Z1231 Encounter for screening mammogram for malignant neoplasm of breast: Secondary | ICD-10-CM | POA: Diagnosis not present

## 2019-11-28 DIAGNOSIS — Z79899 Other long term (current) drug therapy: Secondary | ICD-10-CM | POA: Diagnosis not present

## 2019-11-28 DIAGNOSIS — Z5181 Encounter for therapeutic drug level monitoring: Secondary | ICD-10-CM | POA: Diagnosis not present

## 2019-11-28 LAB — HM MAMMOGRAPHY

## 2019-11-29 ENCOUNTER — Ambulatory Visit (INDEPENDENT_AMBULATORY_CARE_PROVIDER_SITE_OTHER): Payer: Medicare Other | Admitting: Sports Medicine

## 2019-11-29 ENCOUNTER — Encounter: Payer: Self-pay | Admitting: Sports Medicine

## 2019-11-29 ENCOUNTER — Other Ambulatory Visit: Payer: Self-pay

## 2019-11-29 DIAGNOSIS — Q828 Other specified congenital malformations of skin: Secondary | ICD-10-CM

## 2019-11-29 DIAGNOSIS — M7752 Other enthesopathy of left foot: Secondary | ICD-10-CM

## 2019-11-29 DIAGNOSIS — M21622 Bunionette of left foot: Secondary | ICD-10-CM | POA: Diagnosis not present

## 2019-11-29 DIAGNOSIS — M79672 Pain in left foot: Secondary | ICD-10-CM

## 2019-11-29 DIAGNOSIS — M7751 Other enthesopathy of right foot: Secondary | ICD-10-CM | POA: Diagnosis not present

## 2019-11-29 DIAGNOSIS — R200 Anesthesia of skin: Secondary | ICD-10-CM

## 2019-11-29 DIAGNOSIS — M779 Enthesopathy, unspecified: Secondary | ICD-10-CM

## 2019-11-29 DIAGNOSIS — I739 Peripheral vascular disease, unspecified: Secondary | ICD-10-CM

## 2019-11-29 MED ORDER — TRIAMCINOLONE ACETONIDE 10 MG/ML IJ SUSP
10.0000 mg | Freq: Once | INTRAMUSCULAR | Status: AC
  Administered 2019-11-29: 10 mg

## 2019-11-29 NOTE — Patient Instructions (Signed)
Diclofenac OTC

## 2019-11-29 NOTE — Progress Notes (Signed)
Subjective: [name] is a 86 y.o. female patient who returns office for follow-up evaluation of left foot pain patient reports that there is still pain and the pain seems like it does not go away pain is 8-9 out of 10 worse with walking reports that she has been continuing to use her Biofreeze and tube foam cushioning along the lateral aspect of her left foot sometimes it still rubs when the cushion moves out of place causing some redness to the area.  Patient denies any change in medical history patient is still on blood thinner due to A. fib and has a history of neuropathy and numbness to feet.  Patient denies any other pedal complaints or issues at this time.  Patient Active Problem List   Diagnosis Date Noted  . PVD (peripheral vascular disease) ([place]) 11/13/2019  . Serum calcium elevated 01/03/2019  . Prediabetes 09/25/2018  . Vitamin D deficiency 09/04/2018  . A-fib ([place]) 09/04/2018  . Abnormal glucose 09/04/2018  . Need for influenza vaccination 08/08/2018  . Osteoarthritis of left knee 06/07/2018  . Long-term current use of opiate analgesic 11/22/2017  . Chronic low back pain 11/22/2017  . Long term (current) use of anticoagulants [Z79.01] 05/24/2017  . Menopause present 03/27/2017  . Osteopenia 03/27/2017  . Chronic bronchitis with COPD (chronic obstructive pulmonary disease) ([place]) 03/24/2016  . Left foot pain 02/21/2013  . Anticoagulant long-term use 02/25/2011  . Mitral insufficiency   . Essential hypertension   . LVH (left ventricular hypertrophy) due to hypertensive disease   . Pulmonary hypertension ([place])   . Hyperlipidemia   . Back pain, chronic   . Depression     Current Outpatient Medications on File Prior to Visit  Medication Sig Dispense Refill  . ascorbic acid (VITAMIN C) 500 MG tablet Take 500 mg by mouth daily. Takes 650 mg daily    . atorvastatin (LIPITOR) 20 MG tablet Take 1 tablet (20 mg total) by mouth daily. 90 tablet 1  . Biotin 1000 MCG tablet  Take 1 tablet by mouth daily.     . Calcium Carbonate-Vitamin D (CALCIUM + D PO) Take by mouth daily.      . Cholecalciferol (VITAMIN D PO) Take by mouth daily.      . Cyanocobalamin (VITAMIN B-12 PO) Take by mouth as directed.    . diltiazem (TIAZAC) 120 MG 24 hr capsule Take 1 capsule (120 mg total) by mouth daily. 30 capsule 9  . estrogens, conjugated, (PREMARIN) 1.25 MG tablet Take 1.25 mg by mouth daily. Takes 3 x week Mon-Wed-Fri    . folic acid (FOLVITE) 1 MG tablet Take 1 mg by mouth daily.    . furosemide (LASIX) 40 MG tablet TAKE 1/2 TABLET BY MOUTH DAILY 45 tablet 1  . gabapentin (NEURONTIN) 300 MG capsule Take 1 capsule by mouth daily.    Marland Kitchen HYDROcodone-acetaminophen (NORCO) 10-325 MG per tablet Take 1 tablet by mouth every 6 (six) hours as needed.    Marland Kitchen lisinopril (ZESTRIL) 20 MG tablet Take 1 tablet (20 mg total) by mouth daily. 90 tablet 0  . mirabegron ER (MYRBETRIQ) 50 MG TB24 tablet Take 50 mg by mouth daily.    . Misc Natural Products (LUTEIN 20 PO) Take 1 tablet by mouth daily.    . multivitamin-lutein (OCUVITE-LUTEIN) CAPS capsule Take 1 capsule by mouth daily.    . pantoprazole (PROTONIX) 20 MG tablet Take 20 mg by mouth daily.    . potassium chloride SA (K-DUR) 20 MEQ tablet TAKE  ONE TABLET BY MOUTH TWICE DAILY 60 tablet 10  . sertraline (ZOLOFT) 50 MG tablet Take 1&1/2 tablets daily    . valACYclovir (VALTREX) 500 MG tablet     . vitamin A 8000 UNIT capsule Take 8,000 Units by mouth daily.      Marland Kitchen warfarin (COUMADIN) 5 MG tablet TAKE 1/2 TO 1 TABLET BY MOUTH EVERY DAY AS DIRECTED BY COUMADIN CLINIC 90 tablet 1  . [DISCONTINUED] diltiazem (CARDIZEM) 120 MG tablet Take 1 tablet (120 mg total) by mouth daily. 30 tablet 5   No current facility-administered medications on file prior to visit.    Allergies  Allergen Reactions  . Codeine Nausea And Vomiting  . Sulfanilamide   . Penicillins Rash    Objective:  General: Alert and oriented x3 in no acute  distress  Dermatology: No open lesions bilateral lower extremities, no webspace macerations, no ecchymosis bilateral, all nails x 10 are short and thickened with subungual debris consistent with onychomycosis.  There is reactive keratosis noted sub-met 5 bilateral and plantar heel on the left.  Vascular: Dorsalis Pedis and Posterior Tibial pedal pulses faintly palpable bilateral, capillary Fill Time 5 seconds,(-) pedal hair growth bilateral, trace edema bilateral lower extremities, severe varicosities bilateral, temperature gradient decreased.  Neurology: Gross sensation intact via light touch bilateral, (- )Tinels sign bilateral.  History of chronic numbness every since she had a biopsy procedure done on her legs.  Musculoskeletal: Mild tenderness with palpation at fifth metatarsal phalangeal joint on the left foot with prominent fifth metatarsal head consistent with bunionette, there is significant fat pad atrophy plantarly at the ball and the heel, there is pain to palpation today at the plantar central heel on the left, no pain with calf compression bilateral. Strength within normal limits in all groups bilateral for patient status.   Assessment and Plan: Problem List Items Addressed This Visit      Cardiovascular and Mediastinum   PVD (peripheral vascular disease) ([place])    Other Visit Diagnoses    Capsulitis    -  Primary   Relevant Medications   triamcinolone acetonide (KENALOG) 10 MG/ML injection 10 mg (Completed) (Start on 11/29/2019 12:30 PM)   Bunionette of left foot       Relevant Medications   triamcinolone acetonide (KENALOG) 10 MG/ML injection 10 mg (Completed) (Start on 11/29/2019 12:30 PM)   Inflammatory pain of left heel       Relevant Medications   triamcinolone acetonide (KENALOG) 10 MG/ML injection 10 mg (Completed) (Start on 11/29/2019 12:30 PM)   Numbness       Foot pain, left       Relevant Medications   triamcinolone acetonide (KENALOG) 10 MG/ML injection 10 mg  (Completed) (Start on 11/29/2019 12:30 PM)   Porokeratosis         -Complete examination performed -Discussed treatment options for capsulitis inflammatory heel pain with reactive keratosis due to foot type -At no additional charge mechanically debrided keratotic areas using a chisel blade without incident -After oral consent and aseptic prep, injected a mixture containing 1 ml of 2%  plain lidocaine, 1 ml 0.5% plain marcaine, 0.5 ml of kenalog 10 and 0.5 ml of dexamethasone phosphate into left plantar heel and left fifth metatarsal phalangeal joint and tailor's bunion without complication. Post-injection care discussed with patient.  -Advised patient to continue with Biofreeze to left fifth metatarsophalangeal joint or may switch to using diclofenac if she feels as if Biofreeze no longer is helping -Continue with heel cushions bilateral -  Dispensed a new foam toe cushion for patient to use over the left fifth metatarsophalangeal joint when in shoes -Patient to return to office as needed or sooner if problems or issues arise.  Landis Martins, DPM

## 2019-12-04 ENCOUNTER — Encounter: Payer: Self-pay | Admitting: Nurse Practitioner

## 2019-12-10 ENCOUNTER — Ambulatory Visit (INDEPENDENT_AMBULATORY_CARE_PROVIDER_SITE_OTHER): Payer: Medicare Other | Admitting: *Deleted

## 2019-12-10 ENCOUNTER — Other Ambulatory Visit: Payer: Self-pay

## 2019-12-10 DIAGNOSIS — I482 Chronic atrial fibrillation, unspecified: Secondary | ICD-10-CM

## 2019-12-10 DIAGNOSIS — Z7901 Long term (current) use of anticoagulants: Secondary | ICD-10-CM | POA: Diagnosis not present

## 2019-12-10 DIAGNOSIS — I4891 Unspecified atrial fibrillation: Secondary | ICD-10-CM

## 2019-12-10 DIAGNOSIS — Z5181 Encounter for therapeutic drug level monitoring: Secondary | ICD-10-CM

## 2019-12-10 LAB — POCT INR: INR: 2 (ref 2.0–3.0)

## 2019-12-10 NOTE — Patient Instructions (Signed)
Continue taking 1/2 tablet daily except 1 tablet on Mondays.  Repeat INR in 6 week.

## 2019-12-13 DIAGNOSIS — Z23 Encounter for immunization: Secondary | ICD-10-CM | POA: Diagnosis not present

## 2019-12-18 ENCOUNTER — Encounter: Payer: Self-pay | Admitting: Nurse Practitioner

## 2019-12-18 DIAGNOSIS — H35033 Hypertensive retinopathy, bilateral: Secondary | ICD-10-CM | POA: Diagnosis not present

## 2019-12-18 DIAGNOSIS — H40013 Open angle with borderline findings, low risk, bilateral: Secondary | ICD-10-CM | POA: Diagnosis not present

## 2019-12-18 DIAGNOSIS — H35342 Macular cyst, hole, or pseudohole, left eye: Secondary | ICD-10-CM | POA: Diagnosis not present

## 2019-12-18 DIAGNOSIS — H353132 Nonexudative age-related macular degeneration, bilateral, intermediate dry stage: Secondary | ICD-10-CM | POA: Diagnosis not present

## 2019-12-18 LAB — HM DIABETES EYE EXAM

## 2020-01-10 DIAGNOSIS — Z23 Encounter for immunization: Secondary | ICD-10-CM | POA: Diagnosis not present

## 2020-01-13 ENCOUNTER — Encounter: Payer: Self-pay | Admitting: Nurse Practitioner

## 2020-01-14 ENCOUNTER — Other Ambulatory Visit: Payer: Self-pay | Admitting: Cardiology

## 2020-01-21 ENCOUNTER — Ambulatory Visit (INDEPENDENT_AMBULATORY_CARE_PROVIDER_SITE_OTHER): Payer: Medicare Other | Admitting: Pharmacist

## 2020-01-21 ENCOUNTER — Other Ambulatory Visit: Payer: Self-pay

## 2020-01-21 DIAGNOSIS — Z7901 Long term (current) use of anticoagulants: Secondary | ICD-10-CM

## 2020-01-21 DIAGNOSIS — I482 Chronic atrial fibrillation, unspecified: Secondary | ICD-10-CM | POA: Diagnosis not present

## 2020-01-21 DIAGNOSIS — Z5181 Encounter for therapeutic drug level monitoring: Secondary | ICD-10-CM

## 2020-01-21 DIAGNOSIS — I4891 Unspecified atrial fibrillation: Secondary | ICD-10-CM

## 2020-01-21 LAB — POCT INR: INR: 2.4 (ref 2.0–3.0)

## 2020-01-30 DIAGNOSIS — Z79891 Long term (current) use of opiate analgesic: Secondary | ICD-10-CM | POA: Diagnosis not present

## 2020-01-30 DIAGNOSIS — M25511 Pain in right shoulder: Secondary | ICD-10-CM | POA: Diagnosis not present

## 2020-01-30 DIAGNOSIS — G894 Chronic pain syndrome: Secondary | ICD-10-CM | POA: Diagnosis not present

## 2020-01-30 DIAGNOSIS — M545 Low back pain: Secondary | ICD-10-CM | POA: Diagnosis not present

## 2020-02-11 ENCOUNTER — Other Ambulatory Visit: Payer: Self-pay | Admitting: Cardiology

## 2020-02-19 ENCOUNTER — Other Ambulatory Visit: Payer: Self-pay

## 2020-02-19 MED ORDER — LISINOPRIL 20 MG PO TABS: 20.0000 mg | ORAL_TABLET | Freq: Every day | ORAL | 0 refills | Status: DC

## 2020-02-25 ENCOUNTER — Telehealth: Payer: Self-pay | Admitting: Cardiology

## 2020-02-25 NOTE — Telephone Encounter (Signed)
Spoke to patient she wanted to know if it is okay to take Apple Cider Vinegar tablets.Advised I will send message to our pharmacist for advice.

## 2020-02-25 NOTE — Telephone Encounter (Signed)
Apple vinegar tables may decrease her potassium and I see she is already on potassium supplements; therefore, I will recommend against taking it.

## 2020-02-25 NOTE — Telephone Encounter (Signed)
Patient states she is requesting to speak with Dr. Doug Sou nurse, Malachy Mood specifically. Patient did not go into further detail. Please call.

## 2020-02-25 NOTE — Telephone Encounter (Signed)
Spoke to patient [name]'s advice given. 

## 2020-02-26 ENCOUNTER — Encounter: Payer: Self-pay | Admitting: Sports Medicine

## 2020-02-26 ENCOUNTER — Telehealth: Payer: Self-pay | Admitting: *Deleted

## 2020-02-26 ENCOUNTER — Ambulatory Visit (INDEPENDENT_AMBULATORY_CARE_PROVIDER_SITE_OTHER): Payer: Medicare Other | Admitting: Sports Medicine

## 2020-02-26 ENCOUNTER — Other Ambulatory Visit: Payer: Self-pay

## 2020-02-26 DIAGNOSIS — L97521 Non-pressure chronic ulcer of other part of left foot limited to breakdown of skin: Secondary | ICD-10-CM | POA: Diagnosis not present

## 2020-02-26 DIAGNOSIS — M21622 Bunionette of left foot: Secondary | ICD-10-CM | POA: Diagnosis not present

## 2020-02-26 DIAGNOSIS — I739 Peripheral vascular disease, unspecified: Secondary | ICD-10-CM

## 2020-02-26 DIAGNOSIS — I70229 Atherosclerosis of native arteries of extremities with rest pain, unspecified extremity: Secondary | ICD-10-CM | POA: Diagnosis not present

## 2020-02-26 DIAGNOSIS — L84 Corns and callosities: Secondary | ICD-10-CM

## 2020-02-26 NOTE — Telephone Encounter (Signed)
-----   Message from Landis Martins, [place] sent at 02/26/2020 12:35 PM EDT ----- Regarding: ABI/PVRs Left foot ulceration with history of chronic callus in that area Concern due to increased pain especially at night in the left lower extremity for any vascular compromise Please order ABIs PVRs; patient is a cardiology patient of Dr. Peter [name]-- see if his office performs ABIs please have patient to go there to have it done if not patient prefers to have vascular testing done at [facility] or a [facility] Thanks Dr. Cannon Kettle

## 2020-02-26 NOTE — Progress Notes (Signed)
Subjective: [name] is a 86 y.o. female patient who returns office for follow-up evaluation of left foot pain reports that the skin has changed and she has noticed some bleeding to the area of the left fifth metatarsophalangeal joint.  Reports that she has been covering it with a Band-Aid and protecting it but has noticed that there is increased pain especially at night over this area that worsens with change of position of her legs and with going to bed.  Patient denies cramping or symptoms of acute claudication.  Patient reports that she has also seen some bleeding coming from this area as well.  Patient denies any other pedal complaints or issues at this time.  Denies nausea vomiting fever or chills.  Patient Active Problem List   Diagnosis Date Noted  . PVD (peripheral vascular disease) ([place]) 11/13/2019  . Serum calcium elevated 01/03/2019  . Prediabetes 09/25/2018  . Vitamin D deficiency 09/04/2018  . A-fib ([place]) 09/04/2018  . Abnormal glucose 09/04/2018  . Need for influenza vaccination 08/08/2018  . Osteoarthritis of left knee 06/07/2018  . Long-term current use of opiate analgesic 11/22/2017  . Chronic low back pain 11/22/2017  . Long term (current) use of anticoagulants [Z79.01] 05/24/2017  . Menopause present 03/27/2017  . Osteopenia 03/27/2017  . Chronic bronchitis with COPD (chronic obstructive pulmonary disease) ([place]) 03/24/2016  . Left foot pain 02/21/2013  . Anticoagulant long-term use 02/25/2011  . Mitral insufficiency   . Essential hypertension   . LVH (left ventricular hypertrophy) due to hypertensive disease   . Pulmonary hypertension ([place])   . Hyperlipidemia   . Back pain, chronic   . Depression     Current Outpatient Medications on File Prior to Visit  Medication Sig Dispense Refill  . ascorbic acid (VITAMIN C) 500 MG tablet Take 500 mg by mouth daily. Takes 650 mg daily    . atorvastatin (LIPITOR) 20 MG tablet Take 1 tablet (20 mg total) by mouth  daily. 90 tablet 1  . Biotin 1000 MCG tablet Take 1 tablet by mouth daily.     . Calcium Carbonate-Vitamin D (CALCIUM + D PO) Take by mouth daily.      . Cholecalciferol (VITAMIN D PO) Take by mouth daily.      . Cyanocobalamin (VITAMIN B-12 PO) Take by mouth as directed.    . diltiazem (TIAZAC) 120 MG 24 hr capsule TAKE ONE CAPSULE BY MOUTH EVERY DAY 30 capsule 3  . estrogens, conjugated, (PREMARIN) 1.25 MG tablet Take 1.25 mg by mouth daily. Takes 3 x week Mon-Wed-Fri    . folic acid (FOLVITE) 1 MG tablet Take 1 mg by mouth daily.    . furosemide (LASIX) 40 MG tablet TAKE 1/2 TABLET BY MOUTH DAILY 45 tablet 1  . gabapentin (NEURONTIN) 300 MG capsule Take 1 capsule by mouth daily.    Marland Kitchen HYDROcodone-acetaminophen (NORCO) 10-325 MG per tablet Take 1 tablet by mouth every 6 (six) hours as needed.    Marland Kitchen lisinopril (ZESTRIL) 20 MG tablet Take 1 tablet (20 mg total) by mouth daily. 90 tablet 0  . mirabegron ER (MYRBETRIQ) 50 MG TB24 tablet Take 50 mg by mouth daily.    . Misc Natural Products (LUTEIN 20 PO) Take 1 tablet by mouth daily.    . multivitamin-lutein (OCUVITE-LUTEIN) CAPS capsule Take 1 capsule by mouth daily.    . pantoprazole (PROTONIX) 20 MG tablet Take 20 mg by mouth daily.    . pantoprazole (PROTONIX) 40 MG tablet Take 40 mg  by mouth daily.    . potassium chloride SA (KLOR-CON) 20 MEQ tablet TAKE ONE TABLET BY MOUTH TWICE DAILY 60 tablet 10  . sertraline (ZOLOFT) 50 MG tablet Take 1&1/2 tablets daily    . valACYclovir (VALTREX) 500 MG tablet     . vitamin A 8000 UNIT capsule Take 8,000 Units by mouth daily.      Marland Kitchen warfarin (COUMADIN) 5 MG tablet TAKE 1/2 TO 1 TABLET BY MOUTH EVERY DAY AS DIRECTED BY COUMADIN CLINIC 30 tablet 0  . [DISCONTINUED] diltiazem (CARDIZEM) 120 MG tablet Take 1 tablet (120 mg total) by mouth daily. 30 tablet 5   No current facility-administered medications on file prior to visit.    Allergies  Allergen Reactions  . Codeine Nausea And Vomiting  .  Sulfanilamide   . Penicillins Rash    Objective:  General: Alert and oriented x3 in no acute distress  Dermatology: There is a partial thickness ulceration with a granular base present to the dorsal lateral fifth metatarsophalangeal joint on the left that measures 0.3 x 0.4 cm.  This is an area of previous callus that now has ulcerated.  All nails x 10 are short and thickened with subungual debris consistent with onychomycosis.  There is reactive keratosis noted sub-met 5 bilateral and plantar heel on the left.  Vascular: Dorsalis Pedis and Posterior Tibial pedal pulses previous faintly palpable pulses are difficult to detect at this visit.  Capillary Fill Time 5 seconds,(-) pedal hair growth bilateral, trace edema bilateral lower extremities, severe varicosities bilateral, temperature gradient decreased with purple hue and discoloration to all toes.  Neurology: Gross sensation intact via light touch bilateral, (- )Tinels sign bilateral.  History of chronic numbness every since she had a biopsy procedure done on her legs.  Musculoskeletal: Mild tenderness with palpation at fifth metatarsal phalangeal joint on the left foot with prominent fifth metatarsal head consistent with bunionette, mild tenderness palpation to ulcer.   Assessment and Plan: Problem List Items Addressed This Visit      Cardiovascular and Mediastinum   PVD (peripheral vascular disease) ([place])    Other Visit Diagnoses    Foot ulcer, left, limited to breakdown of skin ([place])    -  Primary   Bunionette of left foot       Rest pain of lower extremity due to atherosclerosis ([place])         -Complete examination performed -Discussed treatment options for now concerning nonhealing wound over left fifth metatarsophalangeal joint that was previous a callus that is becoming progressively painful -Cleanse the left foot ulceration and applied Medihoney and Band-Aid dressing and advised patient to do the same daily -Ordered ABIs PVRs  for increased pain especially at night concerning for changes with vascular status that could be contributing to her symptoms and contributing to the new wound -At no additional charge mechanically debrided nails using a nail nipper and smoothed nails using a rotary bur without incident -Advised patient to continue with good shoes that do not rub or irritate the painful areas -Patient to return to office after ABI/vascular testing or sooner if problems or issues arise.  Landis Martins, DPM

## 2020-02-26 NOTE — Telephone Encounter (Signed)
Faxed orders to [facility]. 

## 2020-03-03 ENCOUNTER — Other Ambulatory Visit: Payer: Self-pay

## 2020-03-03 ENCOUNTER — Ambulatory Visit (INDEPENDENT_AMBULATORY_CARE_PROVIDER_SITE_OTHER): Payer: Medicare Other | Admitting: Pharmacist

## 2020-03-03 DIAGNOSIS — Z7901 Long term (current) use of anticoagulants: Secondary | ICD-10-CM

## 2020-03-03 DIAGNOSIS — I482 Chronic atrial fibrillation, unspecified: Secondary | ICD-10-CM

## 2020-03-03 DIAGNOSIS — Z5181 Encounter for therapeutic drug level monitoring: Secondary | ICD-10-CM

## 2020-03-03 DIAGNOSIS — I4891 Unspecified atrial fibrillation: Secondary | ICD-10-CM | POA: Diagnosis not present

## 2020-03-03 LAB — POCT INR: INR: 2.8 (ref 2.0–3.0)

## 2020-03-03 NOTE — Patient Instructions (Signed)
Continue taking 1/2 tablet daily except 1 tablet on Mondays.  Repeat INR in 6 week.

## 2020-03-05 ENCOUNTER — Telehealth: Payer: Self-pay | Admitting: Cardiology

## 2020-03-05 ENCOUNTER — Other Ambulatory Visit: Payer: Self-pay

## 2020-03-05 DIAGNOSIS — E7800 Pure hypercholesterolemia, unspecified: Secondary | ICD-10-CM

## 2020-03-05 MED ORDER — ATORVASTATIN CALCIUM 20 MG PO TABS: 20.0000 mg | ORAL_TABLET | Freq: Every day | ORAL | 1 refills | Status: DC

## 2020-03-05 NOTE — Telephone Encounter (Signed)
Spoke to patient she stated she is scheduled to have lower ext arterial dopplers tomorrow.Stated dopplers were ordered by her foot Dr.Stated she has a growth on side of left foot.Advised I will make Dr.Jordan aware.

## 2020-03-05 NOTE — Telephone Encounter (Signed)
New Message:    Pt says she have an appointment tomorrow and would like to talk to you before she comes in please.

## 2020-03-06 ENCOUNTER — Telehealth: Payer: Self-pay | Admitting: *Deleted

## 2020-03-06 ENCOUNTER — Ambulatory Visit (HOSPITAL_COMMUNITY)
Admission: RE | Admit: 2020-03-06 | Discharge: 2020-03-06 | Disposition: A | Discharge status: Home / Self Care | Payer: Medicare Other | Source: Ambulatory Visit | Attending: Cardiovascular Disease | Admitting: Cardiovascular Disease

## 2020-03-06 ENCOUNTER — Other Ambulatory Visit: Payer: Self-pay

## 2020-03-06 DIAGNOSIS — I739 Peripheral vascular disease, unspecified: Secondary | ICD-10-CM

## 2020-03-06 DIAGNOSIS — I70229 Atherosclerosis of native arteries of extremities with rest pain, unspecified extremity: Secondary | ICD-10-CM

## 2020-03-06 DIAGNOSIS — L97521 Non-pressure chronic ulcer of other part of left foot limited to breakdown of skin: Secondary | ICD-10-CM

## 2020-03-06 DIAGNOSIS — L84 Corns and callosities: Secondary | ICD-10-CM | POA: Diagnosis not present

## 2020-03-06 NOTE — Telephone Encounter (Signed)
Left message for pt to call for results  

## 2020-03-06 NOTE — Telephone Encounter (Signed)
[name], DPM  Harriett Sine D, RN  Patient has decreased flow and occulsions please make sure she gets follow up with vascular

## 2020-03-09 NOTE — Telephone Encounter (Signed)
I spoke with pt and informed of Dr. Leeanne Rio review of results and reinforce the importance of the Dr. Doug Sou 03/26/2020 appt. Pt states understanding.

## 2020-03-09 NOTE — Telephone Encounter (Signed)
Pt called for results.

## 2020-03-11 DIAGNOSIS — M545 Low back pain: Secondary | ICD-10-CM | POA: Diagnosis not present

## 2020-03-11 DIAGNOSIS — Z79891 Long term (current) use of opiate analgesic: Secondary | ICD-10-CM | POA: Diagnosis not present

## 2020-03-11 DIAGNOSIS — G894 Chronic pain syndrome: Secondary | ICD-10-CM | POA: Diagnosis not present

## 2020-03-17 ENCOUNTER — Other Ambulatory Visit: Payer: Self-pay | Admitting: Cardiology

## 2020-03-24 NOTE — Progress Notes (Signed)
[name] Date of Birth: 01-29-1932   History of Present Illness: [name] is seen for follow up Afib. She has a history of chronic atrial fibrillation, hypertensive heart disease, and pulmonary hypertension. She has a history of chronic back problems. She has moderate MR and COPD. In October 2019 she developed progressive weakness in her legs. We held her lipitor without improvement. MRI was done. This showed some chronic fatty atrophy of the muscles c/w denervation in the peroneal nerve distribution. We had discussed switching to a DOAC but she could not afford it. INR is followed in [place].  She is followed by podiatry for evaluation of a left foot ulcer. LE arterial dopplers done as noted below. She states she has noted this ulcer for 2 months. She is walking with difficulty with a walker. She has chronic SOB. Notes occasional congestion. No chest pain. No edema.     Current Outpatient Medications on File Prior to Visit  Medication Sig Dispense Refill  . ascorbic acid (VITAMIN C) 500 MG tablet Take 500 mg by mouth daily. Takes 650 mg daily    . atorvastatin (LIPITOR) 20 MG tablet Take 1 tablet (20 mg total) by mouth daily. 90 tablet 1  . Biotin 1000 MCG tablet Take 1 tablet by mouth daily.     . Calcium Carbonate-Vitamin D (CALCIUM + D PO) Take by mouth daily.      . Cholecalciferol (VITAMIN D PO) Take by mouth daily.      . Cyanocobalamin (VITAMIN B-12 PO) Take by mouth as directed.    . diltiazem (TIAZAC) 120 MG 24 hr capsule TAKE ONE CAPSULE BY MOUTH EVERY DAY 30 capsule 3  . estrogens, conjugated, (PREMARIN) 1.25 MG tablet Take 1.25 mg by mouth daily. Takes 3 x week Mon-Wed-Fri    . folic acid (FOLVITE) 1 MG tablet Take 1 mg by mouth daily.    . furosemide (LASIX) 40 MG tablet TAKE 1/2 TABLET BY MOUTH DAILY 45 tablet 1  . gabapentin (NEURONTIN) 300 MG capsule Take 1 capsule by mouth daily.    Marland Kitchen HYDROcodone-acetaminophen (NORCO) 10-325 MG per tablet Take 1 tablet  by mouth every 6 (six) hours as needed.    Marland Kitchen lisinopril (ZESTRIL) 20 MG tablet Take 1 tablet (20 mg total) by mouth daily. 90 tablet 0  . mirabegron ER (MYRBETRIQ) 50 MG TB24 tablet Take 50 mg by mouth daily.    . Misc Natural Products (LUTEIN 20 PO) Take 1 tablet by mouth daily.    . multivitamin-lutein (OCUVITE-LUTEIN) CAPS capsule Take 1 capsule by mouth daily.    . pantoprazole (PROTONIX) 20 MG tablet Take 20 mg by mouth daily.    . pantoprazole (PROTONIX) 40 MG tablet Take 40 mg by mouth daily.    . potassium chloride SA (KLOR-CON) 20 MEQ tablet TAKE ONE TABLET BY MOUTH TWICE DAILY 60 tablet 10  . sertraline (ZOLOFT) 50 MG tablet Take 1&1/2 tablets daily    . valACYclovir (VALTREX) 500 MG tablet     . vitamin A 8000 UNIT capsule Take 8,000 Units by mouth daily.      Marland Kitchen warfarin (COUMADIN) 5 MG tablet TAKE 1/2 TO 1 TABLET BY MOUTH EVERY DAY AS DIRECTED BY COUMADIN CLINIC 30 tablet 0  . [DISCONTINUED] diltiazem (CARDIZEM) 120 MG tablet Take 1 tablet (120 mg total) by mouth daily. 30 tablet 5   No current facility-administered medications on file prior to visit.    Allergies  Allergen Reactions  . Codeine Nausea And  Vomiting  . Sulfanilamide   . Penicillins Rash    Past Medical History:  Diagnosis Date  . Anemia   . Anticoagulated on Coumadin    CHRONIC  . Arthritis   . Back pain, chronic   . Chronic atrial fibrillation ([place])   . Coronary artery disease CARDIOLOGIST- DR [name]--  LOV IN EPIC  . Depression   . Hammer toe   . Hypercholesterolemia   . Hypertension   . Hypertensive heart disease    WITH LVH  . LVH (left ventricular hypertrophy) due to hypertensive disease   . Mitral insufficiency    CHRONIC  . Pulmonary hypertension ([place])   . Transfusion history     Past Surgical History:  Procedure Laterality Date  . ABDOMINAL HYSTERECTOMY    . ANTERIOR REPAIR/ [place] PUBOVAGINAL SLING  05-03-2002  . BUNIONECTOMY WITH HAMMERTOE RECONSTRUCTION Right 03/01/2013    Procedure: RIGHT FOOT EXCISION BUNIONETTE AND FUSION OF DIP FOURTH TOE;  Surgeon: [name], MD;  Location: [facility];  Service: Orthopedics;  Laterality: Right;  . CARDIAC CATHETERIZATION  02-03-2009  DR  [name]   SINGLE-VESSEL OBSTRUCTIVE CAD, SMALL DIAGONAL BRANCH/ NORMAL LVF/ SEVERE MITRAL INSUFFICIENCY/ SEVERE PULMONARY HYPERTENSION  . CATARACT EXTRACTION W/ INTRAOCULAR LENS  IMPLANT, BILATERAL  2003  . COLONOSCOPY  07/25/2014   few mall scattered diverticula in the ectire examined colon. Small internal hemmorhoids.   Marland Kitchen HAMMER TOE SURGERY  02/28/2012   Procedure: HAMMER TOE CORRECTION;  Surgeon: [name], MD;  [facility];  Service: Orthopedics;  Laterality: Right;  FUSION OF PROXIMAL INTERPHALANGEAL  RIGHT THIRD CLAW TOE  . PARS PLANA VITRECTOMY W/ REPAIR OF MACULAR HOLE  02-04-2002   LEFT EYE  . REPAIR RIGHT SECOND CLAW TOE/ VARUS MEDIAL CLOSING WEDGE OSTEOTOMY PROXIMAL PHALANX RIGHT GREAT TOE  05-05-2005  . TOTAL HIP ARTHROPLASTY     BILATERAL---  LEFT 4/98;   RIGHT  11/98  . VARUS OSTEOTOMY PROXIMAL PHALANX, LEFT GREAT TOE/ PARING DOWN THE CALLUS , SECOND LEFT TOE  10-27-2008    Social History   Tobacco Use  Smoking Status Never Smoker  Smokeless Tobacco Never Used    Social History   Substance and Sexual Activity  Alcohol Use Not Currently   Comment: occasional    Family History  Problem Relation Age of Onset  . Heart disease Mother 66  . Prostate cancer Father 21    Review of Systems:  As noted in history of present illness.  All other systems were reviewed and are negative.  Physical Exam: BP 140/74   Pulse 75   Ht 5' (1.524 m)   Wt 164 lb 3.2 oz (74.5 kg)   SpO2 93%   BMI 32.07 kg/m  GENERAL:  Well appearing WF in NAD HEENT:  PERRL, EOMI, sclera are clear. Oropharynx is clear. NECK:  No jugular venous distention, carotid upstroke brisk and symmetric, no bruits, no thyromegaly or adenopathy LUNGS:   Clear to auscultation bilaterally CHEST:  Unremarkable HEART:  RRR,  PMI not displaced or sustained,S1 and S2 within normal limits, no S3, no S4: no clicks, no rubs, gr [id number] systolic murmur apex.  ABD:  Soft, nontender. BS +, no masses or bruits. No hepatomegaly, no splenomegaly EXT: poor pedal pulses, no edema, venous varicosities. There a a calloused area over the lateral metatarsal joint on the left with ulceration. No redness or drainage.  SKIN:  Warm and dry.  No rashes NEURO:  Alert and oriented x  3. Cranial nerves II through XII intact. PSYCH:  Cognitively intact     LABORATORY DATA:  Lab Results  Component Value Date   WBC 4.6 03/27/2018   HGB 12.6 03/27/2018   HCT 38.6 03/27/2018   PLT 356 03/27/2018   GLUCOSE 106 (H) 08/26/2019   CHOL 202 (H) 08/26/2019   TRIG 144 08/26/2019   HDL 72 08/26/2019   LDLCALC 105 (H) 08/26/2019   ALT 53 (H) 08/26/2019   AST 45 (H) 08/26/2019   NA 142 08/26/2019   K 4.1 08/26/2019   CL 101 08/26/2019   CREATININE 1.06 (H) 08/26/2019   BUN 24 08/26/2019   CO2 25 08/26/2019   TSH 1.170 03/26/2014   INR 2.8 03/03/2020   HGBA1C 5.9 (H) 08/26/2019   MICROALBUR 30 05/08/2019    Labs reviewed from 02/24/17: cholesterol 174, triglycerides 110, LDL 90, HDL 62. A1c 6.0%. CMET and TSH normal. Dated 08/24/17: cholesterol 157, triglycerides 106, HDL 55, LDL 90. A1c 6.1%. CMET and CBC normal   LE arterial dopplers 03/06/20:  Summary:  Left: Moderate progression is noted compared to previous study.   Duplex imaging showed heterogenous plaque in the CFA, DFA, SFA, popliteal  artery and TPT. Medial calcifications noted in the tibial vessels.  50-74% stenosis in the TPT, based on velocity ratio.  Probable one vessel run-off via peroneal artery.  Total occlusion noted in the PTA.  Occluded proximal to mid ATA.   LE ABIs 03/06/20: Right ABIs appear increased compared to prior study on 02/21/2013. Left  ABIs appear essentially unchanged compared  to prior study on 02/21/2013.  Left ABIs are unreliable; arterial wall calcification precludes accurate  ankle pressure and ABIs. Right TBIs  are essentially unchanged; left TBIs have decreased.  Summary:  Right: Resting right ankle-brachial index indicates noncompressible right  lower extremity arteries. The right toe-brachial index is normal.   Left: The left toe-brachial index is abnormal. ABIs are unreliable.  Arterial wall calcification precludes accurate ankle pressure and ABIs.     Assessment / Plan: 1. Atrial fibrillation, permanent. Rate is well controlled on diltiazem. She is anticoagulated on Coumadin. INR has been therapeutic.  2. Mild to Moderate MR. Last Echo 8/16. Asymptomatic. Exam is unchanged.   3. Hypertension with hypertensive heart disease, BP well controlled.  4. Hyperlipidemia on statin therapy. Last LDL 105 increased due to dietary changes. Encourage healthy eating. Will update lipid panel  5. Pulmonary hypertension-moderate  6. Coronary disease. Cardiac catheterization in 2010 showed severe stenosis in a small diagonal branch. She otherwise had no significant disease. She is asymptomatic.   7. Chronic bronchitis and COPD. Chronic  dyspnea. Doesn't want to try inhaler.   8. PAD - has single vessel run off below the knee. Has nonhealing ulcer on the left. Will arrange evaluation with [name].   Follow up in 6 months.

## 2020-03-26 ENCOUNTER — Other Ambulatory Visit: Payer: Self-pay

## 2020-03-26 ENCOUNTER — Encounter: Payer: Self-pay | Admitting: Cardiology

## 2020-03-26 ENCOUNTER — Ambulatory Visit (INDEPENDENT_AMBULATORY_CARE_PROVIDER_SITE_OTHER): Payer: Medicare Other | Admitting: Cardiology

## 2020-03-26 VITALS — BP 140/74 | HR 75 | Ht 60.0 in | Wt 164.2 lb

## 2020-03-26 DIAGNOSIS — I739 Peripheral vascular disease, unspecified: Secondary | ICD-10-CM | POA: Diagnosis not present

## 2020-03-26 DIAGNOSIS — I10 Essential (primary) hypertension: Secondary | ICD-10-CM | POA: Diagnosis not present

## 2020-03-26 DIAGNOSIS — L97521 Non-pressure chronic ulcer of other part of left foot limited to breakdown of skin: Secondary | ICD-10-CM | POA: Diagnosis not present

## 2020-03-26 DIAGNOSIS — I482 Chronic atrial fibrillation, unspecified: Secondary | ICD-10-CM

## 2020-03-27 ENCOUNTER — Telehealth: Payer: Self-pay | Admitting: Cardiology

## 2020-03-27 DIAGNOSIS — E7800 Pure hypercholesterolemia, unspecified: Secondary | ICD-10-CM

## 2020-03-27 DIAGNOSIS — I739 Peripheral vascular disease, unspecified: Secondary | ICD-10-CM

## 2020-03-27 LAB — LIPID PANEL
Chol/HDL Ratio: 3.6 ratio (ref 0.0–4.4)
Cholesterol, Total: 160 mg/dL (ref 100–199)
HDL: 44 mg/dL (ref >39)
LDL Chol Calc (NIH): 92 mg/dL (ref 0–99)
Triglycerides: 134 mg/dL (ref 0–149)
VLDL Cholesterol Cal: 24 mg/dL (ref 5–40)

## 2020-03-27 LAB — CBC WITH DIFFERENTIAL/PLATELET
Basophils Absolute: 0 10*3/uL (ref 0.0–0.2)
Basos: 1 %
EOS (ABSOLUTE): 0.4 10*3/uL (ref 0.0–0.4)
Eos: 5 %
Hematocrit: 40.1 % (ref 34.0–46.6)
Hemoglobin: 13.2 g/dL (ref 11.1–15.9)
Immature Grans (Abs): 0 10*3/uL (ref 0.0–0.1)
Immature Granulocytes: 0 %
Lymphocytes Absolute: 0.5 10*3/uL — ABNORMAL LOW (ref 0.7–3.1)
Lymphs: 8 %
MCH: 29 pg (ref 26.6–33.0)
MCHC: 32.9 g/dL (ref 31.5–35.7)
MCV: 88 fL (ref 79–97)
Monocytes Absolute: 0.7 10*3/uL (ref 0.1–0.9)
Monocytes: 11 %
Neutrophils Absolute: 4.9 10*3/uL (ref 1.4–7.0)
Neutrophils: 75 %
Platelets: 287 10*3/uL (ref 150–450)
RBC: 4.55 x10E6/uL (ref 3.77–5.28)
RDW: 14.5 % (ref 11.7–15.4)
WBC: 6.5 10*3/uL (ref 3.4–10.8)

## 2020-03-27 LAB — COMPREHENSIVE METABOLIC PANEL
ALT: 16 IU/L (ref 0–32)
AST: 23 IU/L (ref 0–40)
Albumin/Globulin Ratio: 1.3 (ref 1.2–2.2)
Albumin: 3.9 g/dL (ref 3.6–4.6)
Alkaline Phosphatase: 105 IU/L (ref 48–121)
BUN/Creatinine Ratio: 13 (ref 12–28)
BUN: 14 mg/dL (ref 8–27)
Bilirubin Total: 0.6 mg/dL (ref 0.0–1.2)
CO2: 20 mmol/L (ref 20–29)
Calcium: 9.5 mg/dL (ref 8.7–10.3)
Chloride: 97 mmol/L (ref 96–106)
Creatinine, Ser: 1.09 mg/dL — ABNORMAL HIGH (ref 0.57–1.00)
GFR calc Af Amer: 53 mL/min/{1.73_m2} — ABNORMAL LOW (ref >59)
GFR calc non Af Amer: 46 mL/min/{1.73_m2} — ABNORMAL LOW (ref >59)
Globulin, Total: 3.1 g/dL (ref 1.5–4.5)
Glucose: 103 mg/dL — ABNORMAL HIGH (ref 65–99)
Potassium: 4.6 mmol/L (ref 3.5–5.2)
Sodium: 134 mmol/L (ref 134–144)
Total Protein: 7 g/dL (ref 6.0–8.5)

## 2020-03-27 LAB — HEMOGLOBIN A1C
Est. average glucose Bld gHb Est-mCnc: 137 mg/dL
Hgb A1c MFr Bld: 6.4 % — ABNORMAL HIGH (ref 4.8–5.6)

## 2020-03-27 LAB — TSH: TSH: 2.34 u[IU]/mL (ref 0.450–4.500)

## 2020-03-27 NOTE — Telephone Encounter (Signed)
Patient requesting to speak with Malachy Mood. She states that yesterday at her OV with Dr. [name] he forgot to talk about a couple things and she would like to discuss with Malachy Mood. She is aware that Malachy Mood is off today and will be back Monday afternoon.

## 2020-03-30 MED ORDER — ATORVASTATIN CALCIUM 40 MG PO TABS
40.0000 mg | ORAL_TABLET | Freq: Every day | ORAL | 3 refills | Status: DC
Start: 2020-03-30 — End: 2020-07-09

## 2020-03-30 MED ORDER — SPIRIVA RESPIMAT 2.5 MCG/ACT IN AERS
2.0000 | INHALATION_SPRAY | Freq: Every day | RESPIRATORY_TRACT | 6 refills | Status: DC
Start: 2020-03-30 — End: 2021-03-15

## 2020-03-30 MED ORDER — ALBUTEROL SULFATE HFA 108 (90 BASE) MCG/ACT IN AERS
2.0000 | INHALATION_SPRAY | Freq: Four times a day (QID) | RESPIRATORY_TRACT | 6 refills | Status: DC | PRN
Start: 2020-03-30 — End: 2020-05-19

## 2020-03-30 NOTE — Telephone Encounter (Signed)
Received a call from patient.She stated she wanted Dr.Jordan to know she has not been feeling good.No energy.Weakness in both legs.Sob, hard to do any work around her house without being sob.No appetite.She did not mention at her last office visit.Advised Dr.Jordan is out of office this week.I will send message to him.

## 2020-03-30 NOTE — Telephone Encounter (Signed)
Patient following up.

## 2020-03-30 NOTE — Telephone Encounter (Signed)
She seemed fine when we saw her last week. I wonder if the hot humid weather isn't causing more problems with her COPD. I would suggest a Spriva 2.5 mg inhaler. 2 puffs daily. Also could prescribe an albuterol inhaler to use PRN as rescue.  Oluwatobiloba Martin [name] MD, [facility]

## 2020-03-30 NOTE — Telephone Encounter (Signed)
Hina is calling returning Cheryl's call. I reached out to [place] via secure chat and then transferred the call over.

## 2020-03-30 NOTE — Telephone Encounter (Signed)
Spoke to patient Dr.Jordan's recommendation given.Inhalers sent to pharmacy.Advised to call back if sob continues.

## 2020-03-30 NOTE — Telephone Encounter (Signed)
Returned call to patient no answer.LMTC. 

## 2020-03-31 ENCOUNTER — Telehealth: Payer: Self-pay | Admitting: Cardiology

## 2020-03-31 ENCOUNTER — Other Ambulatory Visit: Payer: Self-pay

## 2020-03-31 DIAGNOSIS — I739 Peripheral vascular disease, unspecified: Secondary | ICD-10-CM

## 2020-03-31 DIAGNOSIS — E7800 Pure hypercholesterolemia, unspecified: Secondary | ICD-10-CM

## 2020-03-31 NOTE — Telephone Encounter (Signed)
Pt c/o medication issue:  1. Name of Medication: albuterol (VENTOLIN HFA) 108 (90 Base) MCG/ACT inhaler / Spriva 2.5 mg inhaler  2. How are you currently taking this medication (dosage and times per day)? As directed  3. Are you having a reaction (difficulty breathing--STAT)? no  4. What is your medication issue? Patient states that she needs to speak with Malachy Mood in regards to these medications. States, "it is kind of an emergency"

## 2020-03-31 NOTE — Telephone Encounter (Signed)
Returned call to patient no answer.LMTC. 

## 2020-03-31 NOTE — Telephone Encounter (Signed)
Spoke to patient she stated daughter picked up inhalers.Stated she is not sure how to use the Spiriva inhaler.Advised to call pharmacist for instructions.

## 2020-04-07 ENCOUNTER — Encounter: Payer: Self-pay | Admitting: Cardiovascular Disease

## 2020-04-07 ENCOUNTER — Ambulatory Visit (INDEPENDENT_AMBULATORY_CARE_PROVIDER_SITE_OTHER): Payer: Medicare Other | Admitting: Cardiovascular Disease

## 2020-04-07 ENCOUNTER — Other Ambulatory Visit: Payer: Self-pay

## 2020-04-07 VITALS — BP 140/82 | HR 108 | Temp 97.1°F | Ht 64.0 in | Wt 150.8 lb

## 2020-04-07 DIAGNOSIS — I10 Essential (primary) hypertension: Secondary | ICD-10-CM

## 2020-04-07 DIAGNOSIS — E785 Hyperlipidemia, unspecified: Secondary | ICD-10-CM | POA: Diagnosis not present

## 2020-04-07 DIAGNOSIS — I739 Peripheral vascular disease, unspecified: Secondary | ICD-10-CM

## 2020-04-07 DIAGNOSIS — I482 Chronic atrial fibrillation, unspecified: Secondary | ICD-10-CM | POA: Diagnosis not present

## 2020-04-07 NOTE — Patient Instructions (Addendum)
Medication Instructions:  Stop Coumadin 4 days before procedure, June 19th Hold Lasix the day of procedure June 23rd.  *If you need a refill on your cardiac medications before your next appointment, please call your pharmacy*   Lab Work: CBC, BMET, PT/INR 7 days before- week on June 16th  COVID TESTING: 06/21 at 11:55 [employer]   If you have labs (blood work) drawn today and your tests are completely normal, you will receive your results only by: Marland Kitchen MyChart Message (if you have MyChart) OR . A paper copy in the mail If you have any lab test that is abnormal or we need to change your treatment, we will call you to review the results.   Testing/Procedures: ABDOMINAL ANGIOGRAM    Follow-Up: At [facility], you and your health needs are our priority.  As part of our continuing mission to provide you with exceptional heart care, we have created designated Provider Care Teams.  These Care Teams include your primary Cardiologist (physician) and Advanced Practice Providers (APPs -  Physician Assistants and Nurse Practitioners) who all work together to provide you with the care you need, when you need it.  We recommend signing up for the patient portal called "MyChart".  Sign up information is provided on this After Visit Summary.  MyChart is used to connect with patients for Virtual Visits (Telemedicine).  Patients are able to view lab/test results, encounter notes, upcoming appointments, etc.  Non-urgent messages can be sent to your provider as well.   To learn more about what you can do with MyChart, go to [email or web address].    Your next appointment:   1 month(s)  The format for your next appointment:   In Person  Provider:   Kathlyn Sacramento, MD   Other Instructions    [place] [place] [address] Dept: [phone] Loc: [place]  04/07/2020  You are scheduled for a Peripheral Angiogram on Wednesday, June 23 with Dr. Kathlyn Sacramento.  1. Please arrive at the [facility] (Main Entrance A) at [facility]: [address] [place],  [address] at 8:30 AM (This time is two hours before your procedure to ensure your preparation). Free valet parking service is available.   Special note: Every effort is made to have your procedure done on time. Please understand that emergencies sometimes delay scheduled procedures.  2. Diet: Do not eat solid foods after midnight.  The patient may have clear liquids until 5am upon the day of the procedure.  3. Labs: You will need to have blood drawn on CBC, BMET, PT/INR within 7 days of procedure (week of June 16th) . You do not need to be fasting.  4. Medication instructions in preparation for your procedure:   Contrast Allergy: No  Stop taking Coumadin (Warfarin) on Saturday, June 18.  Stop taking, Lasix (Furosemide)  Wednesday, June 23,  On the morning of your procedure, take your Aspirin and any morning medicines NOT listed above.  You may use sips of water.  5. Plan for one night stay--bring personal belongings. 6. Bring a current list of your medications and current insurance cards. 7. You MUST have a responsible person to drive you home. 8. Someone MUST be with you the first 24 hours after you arrive home or your discharge will be delayed. 9. Please wear clothes that are easy to get on and off and wear slip-on shoes.  Thank you for allowing [place] to care for you!   -- [facility]

## 2020-04-07 NOTE — Progress Notes (Signed)
Cardiology Office Note   Date:  04/07/2020   ID:  [name], DOB Jan 19, 1932, MRN [id number]  PCP:  Minette Brine, FNP  Cardiologist: Dr. [name]  No chief complaint on file.     History of Present Illness: [name] is a 86 y.o. female who was referred by Dr. [name] for evaluation and management of peripheral arterial disease.  She has known history of chronic atrial fibrillation, hypertensive heart disease, COPD, moderate mitral regurgitation and pulmonary hypertension. The patient is accompanied today by her daughter.  She developed an ulceration on the lateral side of the left foot 2 to 3 months ago that has not healed since then.  She has been seeing Dr. Cannon Kettle in podiatry.  She reports significant discomfort at the ulceration but has no claudication.  Her physical mobility is somewhat limited.  She has no symptoms on the right side.  She has chronic exertional dyspnea but no chest pain.  She underwent noninvasive vascular evaluation.  ABI was unreliable due to medial calcifications.  Left ABI was 0.42.  Left lower extremity duplex showed no significant common femoral or SFA disease.  There was borderline significant stenosis in the TP trunk with one-vessel runoff via the peroneal artery.  Past Medical History:  Diagnosis Date   Anemia    Anticoagulated on Coumadin    CHRONIC   Arthritis    Back pain, chronic    Chronic atrial fibrillation ([facility])    Coronary artery disease CARDIOLOGIST- DR [name]--  LOV IN EPIC   Depression    Hammer toe    Hypercholesterolemia    Hypertension    Hypertensive heart disease    WITH LVH   LVH (left ventricular hypertrophy) due to hypertensive disease    Mitral insufficiency    CHRONIC   Pulmonary hypertension ([place])    Transfusion history     Past Surgical History:  Procedure Laterality Date   ABDOMINAL HYSTERECTOMY     ANTERIOR REPAIR/ [facility] PUBOVAGINAL SLING  05-03-2002   BUNIONECTOMY WITH  HAMMERTOE RECONSTRUCTION Right 03/01/2013   Procedure: RIGHT FOOT EXCISION BUNIONETTE AND FUSION OF DIP FOURTH TOE;  Surgeon: [name], MD;  Location: [facility];  Service: Orthopedics;  Laterality: Right;   CARDIAC CATHETERIZATION  02-03-2009  DR  [name]   SINGLE-VESSEL OBSTRUCTIVE CAD, SMALL DIAGONAL BRANCH/ NORMAL LVF/ SEVERE MITRAL INSUFFICIENCY/ SEVERE PULMONARY HYPERTENSION   CATARACT EXTRACTION W/ INTRAOCULAR LENS  IMPLANT, BILATERAL  2003   COLONOSCOPY  07/25/2014   few mall scattered diverticula in the ectire examined colon. Small internal hemmorhoids.    HAMMER TOE SURGERY  02/28/2012   Procedure: HAMMER TOE CORRECTION;  Surgeon: [name], MD;  Location: [facility];  Service: Orthopedics;  Laterality: Right;  FUSION OF PROXIMAL INTERPHALANGEAL  RIGHT THIRD CLAW TOE   PARS PLANA VITRECTOMY W/ REPAIR OF MACULAR HOLE  02-04-2002   LEFT EYE   REPAIR RIGHT SECOND CLAW TOE/ VARUS MEDIAL CLOSING WEDGE OSTEOTOMY PROXIMAL PHALANX RIGHT GREAT TOE  05-05-2005   TOTAL HIP ARTHROPLASTY     BILATERAL---  LEFT 4/98;   RIGHT  11/98   VARUS OSTEOTOMY PROXIMAL PHALANX, LEFT GREAT TOE/ PARING DOWN THE CALLUS , SECOND LEFT TOE  10-27-2008     Current Outpatient Medications  Medication Sig Dispense Refill   albuterol (VENTOLIN HFA) 108 (90 Base) MCG/ACT inhaler Inhale 2 puffs into the lungs every 6 (six) hours as needed for wheezing or shortness of breath. 1 g 6  ascorbic acid (VITAMIN C) 500 MG tablet Take 500 mg by mouth daily. Takes 650 mg daily     atorvastatin (LIPITOR) 40 MG tablet Take 1 tablet (40 mg total) by mouth daily. 90 tablet 3   Biotin 1000 MCG tablet Take 1 tablet by mouth daily.      Calcium Carbonate-Vitamin D (CALCIUM + D PO) Take by mouth daily.       Cholecalciferol (VITAMIN D PO) Take by mouth daily.       Cyanocobalamin (VITAMIN B-12 PO) Take by mouth as directed.     diltiazem (TIAZAC) 120 MG 24 hr capsule TAKE  ONE CAPSULE BY MOUTH EVERY DAY 30 capsule 3   estrogens, conjugated, (PREMARIN) 1.25 MG tablet Take 1.25 mg by mouth daily. Takes 3 x week Mon-Wed-Fri     folic acid (FOLVITE) 1 MG tablet Take 1 mg by mouth daily.     furosemide (LASIX) 40 MG tablet TAKE 1/2 TABLET BY MOUTH DAILY 45 tablet 1   gabapentin (NEURONTIN) 300 MG capsule Take 1 capsule by mouth daily.     HYDROcodone-acetaminophen (NORCO) 10-325 MG per tablet Take 1 tablet by mouth every 6 (six) hours as needed.     lisinopril (ZESTRIL) 20 MG tablet Take 1 tablet (20 mg total) by mouth daily. 90 tablet 0   mirabegron ER (MYRBETRIQ) 50 MG TB24 tablet Take 50 mg by mouth daily.     Misc Natural Products (LUTEIN 20 PO) Take 1 tablet by mouth daily.     multivitamin-lutein (OCUVITE-LUTEIN) CAPS capsule Take 1 capsule by mouth daily.     pantoprazole (PROTONIX) 20 MG tablet Take 20 mg by mouth daily.     pantoprazole (PROTONIX) 40 MG tablet Take 40 mg by mouth daily.     potassium chloride SA (KLOR-CON) 20 MEQ tablet TAKE ONE TABLET BY MOUTH TWICE DAILY 60 tablet 10   sertraline (ZOLOFT) 50 MG tablet Take 1&1/2 tablets daily     Tiotropium Bromide Monohydrate (SPIRIVA RESPIMAT) 2.5 MCG/ACT AERS Inhale 2 puffs into the lungs daily. 1 g 6   valACYclovir (VALTREX) 500 MG tablet      vitamin A 8000 UNIT capsule Take 8,000 Units by mouth daily.       warfarin (COUMADIN) 5 MG tablet TAKE 1/2 TO 1 TABLET BY MOUTH EVERY DAY AS DIRECTED BY COUMADIN CLINIC 30 tablet 0   No current facility-administered medications for this visit.    Allergies:   Codeine, Sulfanilamide, and Penicillins    Social History:  The patient  reports that she has never smoked. She has never used smokeless tobacco. She reports previous alcohol use. She reports that she does not use drugs.   Family History:  The patient's family history includes Heart disease (age of onset: 50) in her mother; Prostate cancer (age of onset: 3) in her father.    ROS:   Please see the history of present illness.   Otherwise, review of systems are positive for none.   All other systems are reviewed and negative.    PHYSICAL EXAM: VS:  BP 140/82    Pulse (!) 108    Temp (!) 97.1 F (36.2 C)    Ht 5\' 4"  (1.626 m)    Wt 150 lb 12.8 oz (68.4 kg)    SpO2 98%    BMI 25.88 kg/m  , BMI Body mass index is 25.88 kg/m. GEN: Well nourished, well developed, in no acute distress  HEENT: normal  Neck: no JVD, carotid bruits, or masses  Cardiac: Irregularly irregular; no murmurs, rubs, or gallops,no edema  Respiratory:  clear to auscultation bilaterally, normal work of breathing GI: soft, nontender, nondistended, + BS MS: no deformity or atrophy  Skin: warm and dry, no rash Neuro:  Strength and sensation are intact Psych: euthymic mood, full affect Vascular: Distal pulses are not palpable bilaterally.  There is 3 to 4 mm superficial ulceration on the lateral side of the left foot with no significant drainage.  There is also a small 1 mm ulceration on the fourth toe.   EKG:  EKG is ordered today. The ekg ordered today demonstrates atrial fibrillation with ventricular rate of 108 bpm.  Low voltage.   Recent Labs: 03/26/2020: ALT 16; BUN 14; Creatinine, Ser 1.09; Hemoglobin 13.2; Platelets 287; Potassium 4.6; Sodium 134; TSH 2.340    Lipid Panel    Component Value Date/Time   CHOL 160 03/26/2020 1030   TRIG 134 03/26/2020 1030   HDL 44 03/26/2020 1030   CHOLHDL 3.6 03/26/2020 1030   CHOLHDL 2 01/26/2012 0924   VLDL 18.2 01/26/2012 0924   LDLCALC 92 03/26/2020 1030      Wt Readings from Last 3 Encounters:  04/07/20 150 lb 12.8 oz (68.4 kg)  03/26/20 164 lb 3.2 oz (74.5 kg)  11/13/19 169 lb 12.8 oz (77 kg)       No flowsheet data found.    ASSESSMENT AND PLAN:  1.  Peripheral arterial disease: Critical limb ischemia with nonhealing ulceration on the lateral side of the left foot and a small ulceration on the tip of the fourth toe.  Her exam and  noninvasive evaluation is consistent with tibial peroneal disease.  She has only 1 vessel runoff on the left side via the peroneal artery with significant stenosis proximally in the TP trunk.  Due to this, I recommend proceeding with abdominal aortogram with left lower extremity angiography and possible angioplasty.  I discussed the procedure in details as well as risk and benefits.  Planned access is via the right common femoral artery.  Hold warfarin 4 days before the procedure.  2.  Chronic atrial fibrillation: Ventricular rate is reasonably controlled.  She is on long-term anticoagulation with warfarin which will be held for the procedure.  3.  Essential hypertension: Blood pressure is reasonably controlled.  4.  Hyperlipidemia: Currently on atorvastatin 40 mg daily.  Most recent lipid profile showed an LDL of 77.    Disposition:   FU with me in 1 month  Signed,  Kathlyn Sacramento, MD  04/07/2020 9:59 AM    [place]

## 2020-04-07 NOTE — H&P (View-Only) (Signed)
Cardiology Office Note   Date:  04/07/2020   ID:  [name], DOB [birth year], MRN [id number]  PCP:  Jodi Brine, FNP  Cardiologist: Dr. [name]  No chief complaint on file.     History of Present Illness: [name] is a 86 y.o. female who was referred by Dr. [name] for evaluation and management of peripheral arterial disease.  She has known history of chronic atrial fibrillation, hypertensive heart disease, COPD, moderate mitral regurgitation and pulmonary hypertension. The patient is accompanied today by her daughter.  She developed an ulceration on the lateral side of the left foot 2 to 3 months ago that has not healed since then.  She has been seeing Dr. Cannon Mills in podiatry.  She reports significant discomfort at the ulceration but has no claudication.  Her physical mobility is somewhat limited.  She has no symptoms on the right side.  She has chronic exertional dyspnea but no chest pain.  She underwent noninvasive vascular evaluation.  ABI was unreliable due to medial calcifications.  Left ABI was 0.42.  Left lower extremity duplex showed no significant common femoral or SFA disease.  There was borderline significant stenosis in the TP trunk with one-vessel runoff via the peroneal artery.  Past Medical History:  Diagnosis Date  . Anemia   . Anticoagulated on Coumadin    CHRONIC  . Arthritis   . Back pain, chronic   . Chronic atrial fibrillation ([place])   . Coronary artery disease CARDIOLOGIST- DR [name]--  LOV IN EPIC  . Depression   . Hammer toe   . Hypercholesterolemia   . Hypertension   . Hypertensive heart disease    WITH LVH  . LVH (left ventricular hypertrophy) due to hypertensive disease   . Mitral insufficiency    CHRONIC  . Pulmonary hypertension ([place])   . Transfusion history     Past Surgical History:  Procedure Laterality Date  . ABDOMINAL HYSTERECTOMY    . ANTERIOR REPAIR/ [place] PUBOVAGINAL SLING  05-03-2002  . BUNIONECTOMY WITH  HAMMERTOE RECONSTRUCTION Right 03/01/2013   Procedure: RIGHT FOOT EXCISION BUNIONETTE AND FUSION OF DIP FOURTH TOE;  Surgeon: Jodi Sinning, MD;  Location: [facility];  Service: Orthopedics;  Laterality: Right;  . CARDIAC CATHETERIZATION  02-03-2009  DR  [name]   SINGLE-VESSEL OBSTRUCTIVE CAD, SMALL DIAGONAL BRANCH/ NORMAL LVF/ SEVERE MITRAL INSUFFICIENCY/ SEVERE PULMONARY HYPERTENSION  . CATARACT EXTRACTION W/ INTRAOCULAR LENS  IMPLANT, BILATERAL  2003  . COLONOSCOPY  07/25/2014   few mall scattered diverticula in the ectire examined colon. Small internal hemmorhoids.   Marland Kitchen HAMMER TOE SURGERY  02/28/2012   Procedure: HAMMER TOE CORRECTION;  Surgeon: Jodi Sinning, MD;  Location: [facility];  Service: Orthopedics;  Laterality: Right;  FUSION OF PROXIMAL INTERPHALANGEAL  RIGHT THIRD CLAW TOE  . PARS PLANA VITRECTOMY W/ REPAIR OF MACULAR HOLE  02-04-2002   LEFT EYE  . REPAIR RIGHT SECOND CLAW TOE/ VARUS MEDIAL CLOSING WEDGE OSTEOTOMY PROXIMAL PHALANX RIGHT GREAT TOE  05-05-2005  . TOTAL HIP ARTHROPLASTY     BILATERAL---  LEFT 4/98;   RIGHT  11/98  . VARUS OSTEOTOMY PROXIMAL PHALANX, LEFT GREAT TOE/ PARING DOWN THE CALLUS , SECOND LEFT TOE  10-27-2008     Current Outpatient Medications  Medication Sig Dispense Refill  . albuterol (VENTOLIN HFA) 108 (90 Base) MCG/ACT inhaler Inhale 2 puffs into the lungs every 6 (six) hours as needed for wheezing or shortness of breath. 1 g 6  .  ascorbic acid (VITAMIN C) 500 MG tablet Take 500 mg by mouth daily. Takes 650 mg daily    . atorvastatin (LIPITOR) 40 MG tablet Take 1 tablet (40 mg total) by mouth daily. 90 tablet 3  . Biotin 1000 MCG tablet Take 1 tablet by mouth daily.     . Calcium Carbonate-Vitamin D (CALCIUM + D PO) Take by mouth daily.      . Cholecalciferol (VITAMIN D PO) Take by mouth daily.      . Cyanocobalamin (VITAMIN B-12 PO) Take by mouth as directed.    . diltiazem (TIAZAC) 120 MG 24 hr capsule TAKE  ONE CAPSULE BY MOUTH EVERY DAY 30 capsule 3  . estrogens, conjugated, (PREMARIN) 1.25 MG tablet Take 1.25 mg by mouth daily. Takes 3 x week Mon-Wed-Fri    . folic acid (FOLVITE) 1 MG tablet Take 1 mg by mouth daily.    . furosemide (LASIX) 40 MG tablet TAKE 1/2 TABLET BY MOUTH DAILY 45 tablet 1  . gabapentin (NEURONTIN) 300 MG capsule Take 1 capsule by mouth daily.    Marland Kitchen HYDROcodone-acetaminophen (NORCO) 10-325 MG per tablet Take 1 tablet by mouth every 6 (six) hours as needed.    Marland Kitchen lisinopril (ZESTRIL) 20 MG tablet Take 1 tablet (20 mg total) by mouth daily. 90 tablet 0  . mirabegron ER (MYRBETRIQ) 50 MG TB24 tablet Take 50 mg by mouth daily.    . Misc Natural Products (LUTEIN 20 PO) Take 1 tablet by mouth daily.    . multivitamin-lutein (OCUVITE-LUTEIN) CAPS capsule Take 1 capsule by mouth daily.    . pantoprazole (PROTONIX) 20 MG tablet Take 20 mg by mouth daily.    . pantoprazole (PROTONIX) 40 MG tablet Take 40 mg by mouth daily.    . potassium chloride SA (KLOR-CON) 20 MEQ tablet TAKE ONE TABLET BY MOUTH TWICE DAILY 60 tablet 10  . sertraline (ZOLOFT) 50 MG tablet Take 1&1/2 tablets daily    . Tiotropium Bromide Monohydrate (SPIRIVA RESPIMAT) 2.5 MCG/ACT AERS Inhale 2 puffs into the lungs daily. 1 g 6  . valACYclovir (VALTREX) 500 MG tablet     . vitamin A 8000 UNIT capsule Take 8,000 Units by mouth daily.      Marland Kitchen warfarin (COUMADIN) 5 MG tablet TAKE 1/2 TO 1 TABLET BY MOUTH EVERY DAY AS DIRECTED BY COUMADIN CLINIC 30 tablet 0   No current facility-administered medications for this visit.    Allergies:   Codeine, Sulfanilamide, and Penicillins    Social History:  The patient  reports that she has never smoked. She has never used smokeless tobacco. She reports previous alcohol use. She reports that she does not use drugs.   Family History:  The patient's family history includes Heart disease (age of onset: 40) in her mother; Prostate cancer (age of onset: 44) in her father.    ROS:   Please see the history of present illness.   Otherwise, review of systems are positive for none.   All other systems are reviewed and negative.    PHYSICAL EXAM: VS:  BP 140/82   Pulse (!) 108   Temp (!) 97.1 F (36.2 C)   Ht 5\' 4"  (1.626 m)   Wt 150 lb 12.8 oz (68.4 kg)   SpO2 98%   BMI 25.88 kg/m  , BMI Body mass index is 25.88 kg/m. GEN: Well nourished, well developed, in no acute distress  HEENT: normal  Neck: no JVD, carotid bruits, or masses Cardiac: Irregularly irregular; no murmurs, rubs,  or gallops,no edema  Respiratory:  clear to auscultation bilaterally, normal work of breathing GI: soft, nontender, nondistended, + BS MS: no deformity or atrophy  Skin: warm and dry, no rash Neuro:  Strength and sensation are intact Psych: euthymic mood, full affect Vascular: Distal pulses are not palpable bilaterally.  There is 3 to 4 mm superficial ulceration on the lateral side of the left foot with no significant drainage.  There is also a small 1 mm ulceration on the fourth toe.   EKG:  EKG is ordered today. The ekg ordered today demonstrates atrial fibrillation with ventricular rate of 108 bpm.  Low voltage.   Recent Labs: 03/26/2020: ALT 16; BUN 14; Creatinine, Ser 1.09; Hemoglobin 13.2; Platelets 287; Potassium 4.6; Sodium 134; TSH 2.340    Lipid Panel    Component Value Date/Time   CHOL 160 03/26/2020 1030   TRIG 134 03/26/2020 1030   HDL 44 03/26/2020 1030   CHOLHDL 3.6 03/26/2020 1030   CHOLHDL 2 01/26/2012 0924   VLDL 18.2 01/26/2012 0924   LDLCALC 92 03/26/2020 1030      Wt Readings from Last 3 Encounters:  04/07/20 150 lb 12.8 oz (68.4 kg)  03/26/20 164 lb 3.2 oz (74.5 kg)  11/13/19 169 lb 12.8 oz (77 kg)       No flowsheet data found.    ASSESSMENT AND PLAN:  1.  Peripheral arterial disease: Critical limb ischemia with nonhealing ulceration on the lateral side of the left foot and a small ulceration on the tip of the fourth toe.  Her exam and  noninvasive evaluation is consistent with tibial peroneal disease.  She has only 1 vessel runoff on the left side via the peroneal artery with significant stenosis proximally in the TP trunk.  Due to this, I recommend proceeding with abdominal aortogram with left lower extremity angiography and possible angioplasty.  I discussed the procedure in details as well as risk and benefits.  Planned access is via the right common femoral artery.  Hold warfarin 4 days before the procedure.  2.  Chronic atrial fibrillation: Ventricular rate is reasonably controlled.  She is on long-term anticoagulation with warfarin which will be held for the procedure.  3.  Essential hypertension: Blood pressure is reasonably controlled.  4.  Hyperlipidemia: Currently on atorvastatin 40 mg daily.  Most recent lipid profile showed an LDL of 77.    Disposition:   FU with me in 1 month  Signed,  Kathlyn Sacramento, MD  04/07/2020 9:59 AM    [place]

## 2020-04-08 ENCOUNTER — Telehealth: Payer: Self-pay | Admitting: Cardiovascular Disease

## 2020-04-08 NOTE — Telephone Encounter (Signed)
   Pt would like to know if she can get her labs done at the [facility] office so it's closer to her  Please advise

## 2020-04-08 NOTE — Telephone Encounter (Signed)
Called patient- explained the blood work- and she could have it done at any [facility] because she has the lab orders.   Patient verbalized understanding.

## 2020-04-10 ENCOUNTER — Telehealth: Payer: Self-pay | Admitting: Cardiovascular Disease

## 2020-04-10 NOTE — Telephone Encounter (Signed)
Spoke to patient, she is confused as to when she needs to get bloodwork for her upcoming procedure.   Explained that due to being on coumadin, labs need to be within 7 days of procedure.   Advised to go to [facility] (in [place]) on the 16th, 17th, or 18th.     She is aware and verbalized understanding.  Patient also aware of covid test and location.      Advised to call back with additional questions.

## 2020-04-10 NOTE — Telephone Encounter (Signed)
New Message    Pt is calling and is confused about her upcoming labs   Please call

## 2020-04-21 ENCOUNTER — Other Ambulatory Visit: Payer: Self-pay

## 2020-04-21 ENCOUNTER — Ambulatory Visit (INDEPENDENT_AMBULATORY_CARE_PROVIDER_SITE_OTHER): Payer: Medicare Other | Admitting: Pharmacist

## 2020-04-21 DIAGNOSIS — Z7901 Long term (current) use of anticoagulants: Secondary | ICD-10-CM

## 2020-04-21 DIAGNOSIS — I4891 Unspecified atrial fibrillation: Secondary | ICD-10-CM

## 2020-04-21 DIAGNOSIS — I482 Chronic atrial fibrillation, unspecified: Secondary | ICD-10-CM | POA: Diagnosis not present

## 2020-04-21 DIAGNOSIS — Z5181 Encounter for therapeutic drug level monitoring: Secondary | ICD-10-CM | POA: Diagnosis not present

## 2020-04-21 LAB — BASIC METABOLIC PANEL
BUN/Creatinine Ratio: 18 (ref 12–28)
BUN: 17 mg/dL (ref 8–27)
CO2: 21 mmol/L (ref 20–29)
Calcium: 9.9 mg/dL (ref 8.7–10.3)
Chloride: 102 mmol/L (ref 96–106)
Creatinine, Ser: 0.95 mg/dL (ref 0.57–1.00)
GFR calc Af Amer: 63 mL/min/{1.73_m2} (ref >59)
GFR calc non Af Amer: 54 mL/min/{1.73_m2} — ABNORMAL LOW (ref >59)
Glucose: 106 mg/dL — ABNORMAL HIGH (ref 65–99)
Potassium: 4.5 mmol/L (ref 3.5–5.2)
Sodium: 140 mmol/L (ref 134–144)

## 2020-04-21 LAB — CBC WITH DIFFERENTIAL/PLATELET
Basophils Absolute: 0.1 10*3/uL (ref 0.0–0.2)
Basos: 1 %
EOS (ABSOLUTE): 0.5 10*3/uL — ABNORMAL HIGH (ref 0.0–0.4)
Eos: 7 %
Hematocrit: 39.9 % (ref 34.0–46.6)
Hemoglobin: 12.7 g/dL (ref 11.1–15.9)
Immature Grans (Abs): 0.1 10*3/uL (ref 0.0–0.1)
Immature Granulocytes: 1 %
Lymphocytes Absolute: 1 10*3/uL (ref 0.7–3.1)
Lymphs: 14 %
MCH: 28.9 pg (ref 26.6–33.0)
MCHC: 31.8 g/dL (ref 31.5–35.7)
MCV: 91 fL (ref 79–97)
Monocytes Absolute: 0.7 10*3/uL (ref 0.1–0.9)
Monocytes: 10 %
Neutrophils Absolute: 4.6 10*3/uL (ref 1.4–7.0)
Neutrophils: 67 %
Platelets: 322 10*3/uL (ref 150–450)
RBC: 4.39 x10E6/uL (ref 3.77–5.28)
RDW: 14.3 % (ref 11.7–15.4)
WBC: 6.8 10*3/uL (ref 3.4–10.8)

## 2020-04-21 LAB — POCT INR: INR: 3.8 — AB (ref 2.0–3.0)

## 2020-04-21 NOTE — Patient Instructions (Signed)
Description   Continue taking 1/2 tablet daily except 1 tablet on Mondays.  Repeat INR in 1 week before procedure

## 2020-04-22 ENCOUNTER — Other Ambulatory Visit: Payer: Self-pay

## 2020-04-22 MED ORDER — WARFARIN SODIUM 5 MG PO TABS: 2.5000 mg | ORAL_TABLET | ORAL | 1 refills | Status: DC

## 2020-04-23 ENCOUNTER — Telehealth: Payer: Self-pay | Admitting: Cardiovascular Disease

## 2020-04-23 ENCOUNTER — Other Ambulatory Visit: Payer: Self-pay | Admitting: Cardiology

## 2020-04-23 NOTE — Telephone Encounter (Signed)
Pt would like a copy of her lab results sent to her

## 2020-04-23 NOTE — Telephone Encounter (Signed)
Mailed letter with lab results.

## 2020-04-27 ENCOUNTER — Other Ambulatory Visit (HOSPITAL_COMMUNITY)
Admission: RE | Admit: 2020-04-27 | Discharge: 2020-04-27 | Disposition: A | Discharge status: Home / Self Care | Payer: Medicare Other | Source: Ambulatory Visit | Attending: Cardiovascular Disease | Admitting: Cardiovascular Disease

## 2020-04-27 DIAGNOSIS — Z01812 Encounter for preprocedural laboratory examination: Secondary | ICD-10-CM | POA: Insufficient documentation

## 2020-04-27 DIAGNOSIS — Z20822 Contact with and (suspected) exposure to covid-19: Secondary | ICD-10-CM | POA: Insufficient documentation

## 2020-04-27 LAB — SARS CORONAVIRUS 2 (TAT 6-24 HRS): SARS Coronavirus 2: NEGATIVE

## 2020-04-28 ENCOUNTER — Telehealth: Payer: Self-pay | Admitting: *Deleted

## 2020-04-28 NOTE — Telephone Encounter (Signed)
Pt contacted pre-catheterization scheduled at [facility] for: Wednesday April 29, 2020 10:30 AM Verified arrival time and place: [facility]) at:8:30 AM   No solid food after midnight prior to cath, clear liquids until 5 AM day of procedure.  Hold: Coumadin-per instructions none 04/24/20 until post procedure --- patient states she took last dose 04/24/20. Lasix/KCl-day before and day of procedure-GFR 54 Lisinopril-day before and day of procedure-GFR 54  Except hold medications AM meds can be  taken pre-cath with sips of water including: ASA 81 mg-pt states she does not have any and is not sure she can get any  to take AM of procedure.   Confirmed patient has responsible adult to drive home post procedure and observe 24 hours after arriving home: yes  You are allowed ONE visitor in the waiting room during your procedure. Both you and your visitor must wear masks.      COVID-19 Pre-Screening Questions:  . In the past 7 to 10 days have you had a new cough, shortness of breath, headache, congestion, fever (100 or greater) unexplained body aches, new sore throat, or sudden loss of taste or sense of smell? no . In the past 7 to 10 days have you been around anyone with known Covid 19? no    Reviewed procedure/mask/visitor instructions, COVID-19 screening questions with patient.

## 2020-04-29 ENCOUNTER — Ambulatory Visit (HOSPITAL_COMMUNITY)
Admission: RE | Disposition: A | Discharge status: Home / Self Care | Payer: Medicare Other | Source: Home / Self Care | Attending: Cardiovascular Disease

## 2020-04-29 ENCOUNTER — Other Ambulatory Visit: Payer: Self-pay

## 2020-04-29 ENCOUNTER — Ambulatory Visit (HOSPITAL_COMMUNITY)
Admission: RE | Admit: 2020-04-29 | Discharge: 2020-04-29 | Disposition: A | Discharge status: Home / Self Care | Payer: Medicare Other | Attending: Cardiovascular Disease | Admitting: Cardiovascular Disease

## 2020-04-29 DIAGNOSIS — I482 Chronic atrial fibrillation, unspecified: Secondary | ICD-10-CM | POA: Diagnosis not present

## 2020-04-29 DIAGNOSIS — I2510 Atherosclerotic heart disease of native coronary artery without angina pectoris: Secondary | ICD-10-CM | POA: Diagnosis not present

## 2020-04-29 DIAGNOSIS — Z885 Allergy status to narcotic agent status: Secondary | ICD-10-CM | POA: Diagnosis not present

## 2020-04-29 DIAGNOSIS — E7800 Pure hypercholesterolemia, unspecified: Secondary | ICD-10-CM | POA: Insufficient documentation

## 2020-04-29 DIAGNOSIS — L97529 Non-pressure chronic ulcer of other part of left foot with unspecified severity: Secondary | ICD-10-CM | POA: Insufficient documentation

## 2020-04-29 DIAGNOSIS — E785 Hyperlipidemia, unspecified: Secondary | ICD-10-CM | POA: Diagnosis not present

## 2020-04-29 DIAGNOSIS — J449 Chronic obstructive pulmonary disease, unspecified: Secondary | ICD-10-CM | POA: Diagnosis not present

## 2020-04-29 DIAGNOSIS — Z79899 Other long term (current) drug therapy: Secondary | ICD-10-CM | POA: Insufficient documentation

## 2020-04-29 DIAGNOSIS — F329 Major depressive disorder, single episode, unspecified: Secondary | ICD-10-CM | POA: Diagnosis not present

## 2020-04-29 DIAGNOSIS — I340 Nonrheumatic mitral (valve) insufficiency: Secondary | ICD-10-CM | POA: Insufficient documentation

## 2020-04-29 DIAGNOSIS — I70245 Atherosclerosis of native arteries of left leg with ulceration of other part of foot: Secondary | ICD-10-CM | POA: Insufficient documentation

## 2020-04-29 DIAGNOSIS — I119 Hypertensive heart disease without heart failure: Secondary | ICD-10-CM | POA: Diagnosis not present

## 2020-04-29 DIAGNOSIS — Z7901 Long term (current) use of anticoagulants: Secondary | ICD-10-CM | POA: Insufficient documentation

## 2020-04-29 DIAGNOSIS — Z880 Allergy status to penicillin: Secondary | ICD-10-CM | POA: Insufficient documentation

## 2020-04-29 DIAGNOSIS — I272 Pulmonary hypertension, unspecified: Secondary | ICD-10-CM | POA: Insufficient documentation

## 2020-04-29 DIAGNOSIS — I739 Peripheral vascular disease, unspecified: Secondary | ICD-10-CM

## 2020-04-29 HISTORY — PX: PERIPHERAL VASCULAR BALLOON ANGIOPLASTY: CATH118281

## 2020-04-29 HISTORY — PX: ABDOMINAL AORTOGRAM W/LOWER EXTREMITY: CATH118223

## 2020-04-29 LAB — PROTIME-INR
INR: 1.6 — ABNORMAL HIGH (ref 0.8–1.2)
Prothrombin Time: 18.7 seconds — ABNORMAL HIGH (ref 11.4–15.2)

## 2020-04-29 LAB — POCT ACTIVATED CLOTTING TIME: Activated Clotting Time: 246 seconds

## 2020-04-29 SURGERY — ABDOMINAL AORTOGRAM W/LOWER EXTREMITY
Anesthesia: LOCAL

## 2020-04-29 MED ORDER — HEPARIN (PORCINE) IN NACL 1000-0.9 UT/500ML-% IV SOLN
INTRAVENOUS | Status: DC | PRN
  Administered 2020-04-29 (×2): 500 mL

## 2020-04-29 MED ORDER — SODIUM CHLORIDE 0.9 % IV SOLN: INTRAVENOUS | Status: DC

## 2020-04-29 MED ORDER — HEPARIN SODIUM (PORCINE) 1000 UNIT/ML IJ SOLN
INTRAMUSCULAR | Status: DC | PRN
  Administered 2020-04-29: 7000 [IU] via INTRAVENOUS

## 2020-04-29 MED ORDER — SODIUM CHLORIDE 0.9% FLUSH: 3.0000 mL | INTRAVENOUS | Status: DC | PRN

## 2020-04-29 MED ORDER — MIDAZOLAM HCL 2 MG/2ML IJ SOLN
INTRAMUSCULAR | Status: DC | PRN
  Administered 2020-04-29 (×2): 1 mg via INTRAVENOUS

## 2020-04-29 MED ORDER — FENTANYL CITRATE (PF) 100 MCG/2ML IJ SOLN
INTRAMUSCULAR | Status: DC | PRN
  Administered 2020-04-29 (×2): 25 ug via INTRAVENOUS

## 2020-04-29 MED ORDER — FENTANYL CITRATE (PF) 100 MCG/2ML IJ SOLN
INTRAMUSCULAR | Status: AC
  Filled 2020-04-29: qty 2

## 2020-04-29 MED ORDER — ACETAMINOPHEN 325 MG PO TABS: 650.0000 mg | ORAL_TABLET | ORAL | Status: DC | PRN

## 2020-04-29 MED ORDER — LIDOCAINE HCL (PF) 1 % IJ SOLN
INTRAMUSCULAR | Status: AC
  Filled 2020-04-29: qty 30

## 2020-04-29 MED ORDER — ASPIRIN 81 MG PO CHEW: 81.0000 mg | CHEWABLE_TABLET | ORAL | Status: DC

## 2020-04-29 MED ORDER — LABETALOL HCL 5 MG/ML IV SOLN: 10.0000 mg | INTRAVENOUS | Status: DC | PRN

## 2020-04-29 MED ORDER — SODIUM CHLORIDE 0.9 % IV SOLN: 250.0000 mL | INTRAVENOUS | Status: DC | PRN

## 2020-04-29 MED ORDER — SODIUM CHLORIDE 0.9% FLUSH: 3.0000 mL | Freq: Two times a day (BID) | INTRAVENOUS | Status: DC

## 2020-04-29 MED ORDER — IOHEXOL 350 MG/ML SOLN
INTRAVENOUS | Status: DC | PRN
  Administered 2020-04-29: 140 mL

## 2020-04-29 MED ORDER — MIDAZOLAM HCL 5 MG/5ML IJ SOLN
INTRAMUSCULAR | Status: AC
  Filled 2020-04-29: qty 10

## 2020-04-29 MED ORDER — HEPARIN (PORCINE) IN NACL 1000-0.9 UT/500ML-% IV SOLN
INTRAVENOUS | Status: AC
  Filled 2020-04-29: qty 1000

## 2020-04-29 MED ORDER — ONDANSETRON HCL 4 MG/2ML IJ SOLN: 4.0000 mg | Freq: Four times a day (QID) | INTRAMUSCULAR | Status: DC | PRN

## 2020-04-29 MED ORDER — HEPARIN SODIUM (PORCINE) 1000 UNIT/ML IJ SOLN
INTRAMUSCULAR | Status: AC
  Filled 2020-04-29: qty 1

## 2020-04-29 MED ORDER — LIDOCAINE HCL (PF) 1 % IJ SOLN
INTRAMUSCULAR | Status: DC | PRN
  Administered 2020-04-29: 15 mL via INTRADERMAL

## 2020-04-29 SURGICAL SUPPLY — 19 items
CATH ANGIO 5F PIGTAIL 65CM (CATHETERS) ×3 IMPLANT
CATH CROSS OVER TEMPO 5F (CATHETERS) ×3 IMPLANT
CATH STRAIGHT 5FR 65CM (CATHETERS) ×3 IMPLANT
CATH TELEPORT (CATHETERS) ×3 IMPLANT
CLOSURE MYNX CONTROL 6F/7F (Vascular Products) ×3 IMPLANT
GUIDEWIRE ZILIENT 12G 014X300 (WIRE) ×3 IMPLANT
GUIDEWIRE ZILIENT 6G 014 (WIRE) ×3 IMPLANT
KIT ESSENTIALS PG (KITS) ×3 IMPLANT
KIT MICROPUNCTURE NIT STIFF (SHEATH) ×3 IMPLANT
KIT PV (KITS) ×3 IMPLANT
SHEATH PINNACLE 5F 10CM (SHEATH) ×3 IMPLANT
SHEATH PINNACLE 6F 10CM (SHEATH) ×3 IMPLANT
SHEATH PINNACLE ST 6F 65CM (SHEATH) ×3 IMPLANT
SYR MEDRAD MARK 7 150ML (SYRINGE) ×3 IMPLANT
TAPE VIPERTRACK RADIOPAQ (MISCELLANEOUS) ×2 IMPLANT
TAPE VIPERTRACK RADIOPAQUE (MISCELLANEOUS) ×3
TRANSDUCER W/STOPCOCK (MISCELLANEOUS) ×3 IMPLANT
TRAY PV CATH (CUSTOM PROCEDURE TRAY) ×3 IMPLANT
WIRE HITORQ VERSACORE ST 145CM (WIRE) ×3 IMPLANT

## 2020-04-29 NOTE — Discharge Instructions (Signed)
Femoral Site Care This sheet gives you information about how to care for yourself after your procedure. Your health care provider may also give you more specific instructions. If you have problems or questions, contact your health care provider. What can I expect after the procedure?  After the procedure, it is common to have:  Bruising that usually fades within 1-2 weeks.  Tenderness at the site. Follow these instructions at home: Wound care 1. Follow instructions from your health care provider about how to take care of your insertion site. Make sure you: ? Wash your hands with soap and water before you change your bandage (dressing). If soap and water are not available, use hand sanitizer. ? Remove your dressing as told by your health care provider. In 24 hours 2. Do not take baths, swim, or use a hot tub until your health care provider approves. 3. You may shower 24-48 hours after the procedure or as told by your health care provider. ? Gently wash the site with plain soap and water. ? Pat the area dry with a clean towel. ? Do not rub the site. This may cause bleeding. 4. Do not apply powder or lotion to the site. Keep the site clean and dry. 5. Check your femoral site every day for signs of infection. Check for: ? Redness, swelling, or pain. ? Fluid or blood. ? Warmth. ? Pus or a bad smell. Activity 1. For the first 2-3 days after your procedure, or as long as directed: ? Avoid climbing stairs as much as possible. ? Do not squat. 2. Do not lift anything that is heavier than 10 lb (4.5 kg), or the limit that you are told, until your health care provider says that it is safe. For 5 days 3. Rest as directed. ? Avoid sitting for a long time without moving. Get up to take short walks every 1-2 hours. 4. Do not drive for 24 hours if you were given a medicine to help you relax (sedative). General instructions  Take over-the-counter and prescription medicines only as told by your  health care provider.  Keep all follow-up visits as told by your health care provider. This is important. Contact a health care provider if you have:  A fever or chills.  You have redness, swelling, or pain around your insertion site. Get help right away if:  The catheter insertion area swells very fast.  You pass out.  You suddenly start to sweat or your skin gets clammy.  The catheter insertion area is bleeding, and the bleeding does not stop when you hold steady pressure on the area.  The area near or just beyond the catheter insertion site becomes pale, cool, tingly, or numb. These symptoms may represent a serious problem that is an emergency. Do not wait to see if the symptoms will go away. Get medical help right away. Call your local emergency services (911 in the [place] Do not drive yourself to the hospital. Summary  After the procedure, it is common to have bruising that usually fades within 1-2 weeks.  Check your femoral site every day for signs of infection.  Do not lift anything that is heavier than 10 lb (4.5 kg), or the limit that you are told, until your health care provider says that it is safe. This information is not intended to replace advice given to you by your health care provider. Make sure you discuss any questions you have with your health care provider. Document Revised: 11/06/2017 Document Reviewed: 11/06/2017   Elsevier Patient Education  2020 [employer]   

## 2020-04-29 NOTE — Interval H&P Note (Signed)
History and Physical Interval Note:  04/29/2020 11:26 AM  [name]  has presented today for surgery, with the diagnosis of pad.  The various methods of treatment have been discussed with the patient and family. After consideration of risks, benefits and other options for treatment, the patient has consented to  Procedure(s): ABDOMINAL AORTOGRAM W/LOWER EXTREMITY (N/A) as a surgical intervention.  The patient's history has been reviewed, patient examined, no change in status, stable for surgery.  I have reviewed the patient's chart and labs.  Questions were answered to the patient's satisfaction.     Kathlyn Sacramento

## 2020-04-30 ENCOUNTER — Encounter (HOSPITAL_COMMUNITY): Payer: Self-pay | Admitting: Cardiovascular Disease

## 2020-04-30 MED FILL — Midazolam HCl Inj 2 MG/2ML (Base Equivalent): INTRAMUSCULAR | Qty: 2 | Status: CN

## 2020-04-30 MED FILL — Midazolam HCl Inj 5 MG/5ML (Base Equivalent): INTRAMUSCULAR | Qty: 2 | Status: AC

## 2020-05-01 ENCOUNTER — Telehealth: Payer: Self-pay | Admitting: Cardiovascular Disease

## 2020-05-01 NOTE — Telephone Encounter (Signed)
Spoke to patient 04/21/20 bmet and cbc results given.Copy of results mailed to patient.

## 2020-05-01 NOTE — Telephone Encounter (Signed)
Patient would like her lab results mailed to her. She states she asked a couple days ago but has not received them. I confirmed her address listed on MyChart.

## 2020-05-05 ENCOUNTER — Telehealth: Payer: Self-pay | Admitting: Cardiology

## 2020-05-05 NOTE — Telephone Encounter (Signed)
Follow Up  Patient is calling back in to give phone number for nurse Malachy Mood to call her back. Please give patient a call back.

## 2020-05-05 NOTE — Telephone Encounter (Signed)
Called patient she stated she was calling to schedule follow up appointment with Dr.Jordan for November.Appointment scheduled 09/24/20 at 10:00 am.

## 2020-05-05 NOTE — Telephone Encounter (Signed)
New Message:      Please call, said she need to talk to you please.

## 2020-05-06 ENCOUNTER — Telehealth: Payer: Self-pay | Admitting: Cardiology

## 2020-05-06 NOTE — Telephone Encounter (Signed)
    Pt said, she only want Dr. [name] sending her prescription and refills

## 2020-05-06 NOTE — Telephone Encounter (Signed)
Pt calling requesting that all her cardiac med refill be sent under Dr. [name]. She doesn't need any refills at the moment but wanted to make [place] aware for the future.

## 2020-05-12 ENCOUNTER — Other Ambulatory Visit: Payer: Self-pay

## 2020-05-12 ENCOUNTER — Ambulatory Visit (INDEPENDENT_AMBULATORY_CARE_PROVIDER_SITE_OTHER): Payer: Medicare Other | Admitting: Pharmacist Clinician (PhC)/ Clinical Pharmacy Specialist

## 2020-05-12 DIAGNOSIS — Z7901 Long term (current) use of anticoagulants: Secondary | ICD-10-CM

## 2020-05-12 DIAGNOSIS — Z5181 Encounter for therapeutic drug level monitoring: Secondary | ICD-10-CM | POA: Diagnosis not present

## 2020-05-12 DIAGNOSIS — I482 Chronic atrial fibrillation, unspecified: Secondary | ICD-10-CM

## 2020-05-12 DIAGNOSIS — I4891 Unspecified atrial fibrillation: Secondary | ICD-10-CM | POA: Diagnosis not present

## 2020-05-12 LAB — POCT INR: INR: 2.6 (ref 2.0–3.0)

## 2020-05-13 ENCOUNTER — Encounter: Payer: Self-pay | Admitting: Nurse Practitioner

## 2020-05-13 ENCOUNTER — Ambulatory Visit (INDEPENDENT_AMBULATORY_CARE_PROVIDER_SITE_OTHER): Payer: Medicare Other

## 2020-05-13 ENCOUNTER — Ambulatory Visit (INDEPENDENT_AMBULATORY_CARE_PROVIDER_SITE_OTHER): Payer: Medicare Other | Admitting: Nurse Practitioner

## 2020-05-13 VITALS — BP 142/78 | HR 82 | Temp 98.4°F | Ht 64.0 in | Wt 165.0 lb

## 2020-05-13 VITALS — BP 150/90 | HR 106 | Temp 98.4°F | Ht 64.0 in | Wt 165.0 lb

## 2020-05-13 DIAGNOSIS — E785 Hyperlipidemia, unspecified: Secondary | ICD-10-CM

## 2020-05-13 DIAGNOSIS — Z0000 Encounter for general adult medical examination without abnormal findings: Secondary | ICD-10-CM

## 2020-05-13 DIAGNOSIS — I10 Essential (primary) hypertension: Secondary | ICD-10-CM | POA: Diagnosis not present

## 2020-05-13 DIAGNOSIS — J449 Chronic obstructive pulmonary disease, unspecified: Secondary | ICD-10-CM | POA: Diagnosis not present

## 2020-05-13 DIAGNOSIS — R7303 Prediabetes: Secondary | ICD-10-CM

## 2020-05-13 LAB — POCT UA - MICROALBUMIN
Albumin/Creatinine Ratio, Urine, POC: <30
Creatinine, POC: 200 mg/dL
Microalbumin Ur, POC: 30 mg/L

## 2020-05-13 LAB — POCT URINALYSIS DIPSTICK
Bilirubin, UA: NEGATIVE
Blood, UA: NEGATIVE
Glucose, UA: NEGATIVE
Ketones, UA: NEGATIVE
Nitrite, UA: NEGATIVE
Protein, UA: NEGATIVE
Spec Grav, UA: 1.02 (ref 1.010–1.025)
Urobilinogen, UA: 0.2 E.U./dL
pH, UA: 5.5 (ref 5.0–8.0)

## 2020-05-13 NOTE — Addendum Note (Signed)
Addended by: Kellie Simmering on: 05/13/2020 04:22 PM   Modules accepted: Orders

## 2020-05-13 NOTE — Progress Notes (Signed)
This visit occurred during the SARS-CoV-2 public health emergency.  Safety protocols were in place, including screening questions prior to the visit, additional usage of staff PPE, and extensive cleaning of exam room while observing appropriate contact time as indicated for disinfecting solutions.  Subjective:   [name] is a 86 y.o. female who presents for Medicare Annual (Subsequent) preventive examination.  Review of Systems     Cardiac Risk Factors include: advanced age (>88men, >20 women);hypertension;dyslipidemia;sedentary lifestyle     Objective:    Today's Vitals   05/13/20 1109 05/13/20 1114 05/13/20 1131  BP: (!) 150/90  (!) 142/78  Pulse: (!) 106  82  Temp: 98.4 F (36.9 C)    TempSrc: Oral    SpO2: 96%    Weight: 165 lb (74.8 kg)    Height: 5\' 4"  (1.626 m)    PainSc:  5     Body mass index is 28.32 kg/m.  Advanced Directives 05/13/2020 04/29/2020 05/08/2019 03/01/2013 02/27/2013 02/28/2012  Does Patient Have a Medical Advance Directive? Yes Yes Yes Patient has advance directive, copy not in chart Patient has advance directive, copy not in chart Patient has advance directive, copy not in chart  Type of Advance Directive [place];Living will Healthcare Power of [place];Living will [place];Living will [place];Living will Living will;Healthcare Power of [place] in Chart? No - copy requested No - copy requested No - copy requested Copy requested from family - -  Pre-existing out of facility DNR order (yellow form or pink MOST form) - - - No - -    Current Medications (verified) Outpatient Encounter Medications as of 05/13/2020  Medication Sig  . ascorbic acid (VITAMIN C) 500 MG tablet Take 500 mg by mouth daily.   Marland Kitchen atorvastatin (LIPITOR) 40 MG tablet Take 1 tablet (40 mg total) by mouth daily.  . Biotin 1000 MCG tablet Take 1 tablet by  mouth daily.   . Calcium Carbonate-Vitamin D (CALCIUM + D PO) Take by mouth daily.    . Cholecalciferol (VITAMIN D PO) Take by mouth daily.    . Cyanocobalamin (VITAMIN B-12 PO) Take 1 tablet by mouth daily.   Marland Kitchen diltiazem (TIAZAC) 120 MG 24 hr capsule TAKE ONE CAPSULE BY MOUTH EVERY DAY  . estrogens, conjugated, (PREMARIN) 1.25 MG tablet Take 1.25 mg by mouth daily. Takes 3 x week Mon-Wed-Fri  . folic acid (FOLVITE) 1 MG tablet Take 1 mg by mouth daily.  . furosemide (LASIX) 40 MG tablet TAKE 1/2 TABLET BY MOUTH DAILY (Patient taking differently: Take 20 mg by mouth every other day. )  . gabapentin (NEURONTIN) 300 MG capsule Take 600 mg by mouth at bedtime.   Marland Kitchen HYDROcodone-acetaminophen (NORCO) 10-325 MG per tablet Take 1 tablet by mouth every 6 (six) hours as needed for moderate pain.   Marland Kitchen lisinopril (ZESTRIL) 20 MG tablet Take 1 tablet (20 mg total) by mouth daily.  . Multiple Vitamins-Minerals (PRESERVISION AREDS 2 PO) Take 1 capsule by mouth in the morning and at bedtime.  . pantoprazole (PROTONIX) 40 MG tablet Take 40 mg by mouth daily.  . potassium chloride SA (KLOR-CON) 20 MEQ tablet TAKE ONE TABLET BY MOUTH TWICE DAILY (Patient taking differently: Take 20 mEq by mouth 2 (two) times daily. )  . sertraline (ZOLOFT) 50 MG tablet Take 75 mg by mouth daily.   . Tiotropium Bromide Monohydrate (SPIRIVA RESPIMAT) 2.5 MCG/ACT AERS Inhale 2  puffs into the lungs daily.  . vitamin A 8000 UNIT capsule Take 8,000 Units by mouth daily.    Marland Kitchen warfarin (COUMADIN) 5 MG tablet Take 0.5-1 tablets (2.5-5 mg total) by mouth See admin instructions. 5 mg on Mondays and 2.5 mg all other days  . albuterol (VENTOLIN HFA) 108 (90 Base) MCG/ACT inhaler Inhale 2 puffs into the lungs every 6 (six) hours as needed for wheezing or shortness of breath. (Patient not taking: Reported on 05/13/2020)  . [DISCONTINUED] diltiazem (CARDIZEM) 120 MG tablet Take 1 tablet (120 mg total) by mouth daily.   No facility-administered  encounter medications on file as of 05/13/2020.    Allergies (verified) Sulfanilamide and Penicillins   History: Past Medical History:  Diagnosis Date  . Anemia   . Anticoagulated on Coumadin    CHRONIC  . Arthritis   . Back pain, chronic   . Chronic atrial fibrillation ([place])   . Coronary artery disease CARDIOLOGIST- DR [name]--  LOV IN EPIC  . Depression   . Hammer toe   . Hypercholesterolemia   . Hypertension   . Hypertensive heart disease    WITH LVH  . LVH (left ventricular hypertrophy) due to hypertensive disease   . Mitral insufficiency    CHRONIC  . Pulmonary hypertension ([place])   . Transfusion history    Past Surgical History:  Procedure Laterality Date  . ABDOMINAL AORTOGRAM W/LOWER EXTREMITY N/A 04/29/2020   Procedure: ABDOMINAL AORTOGRAM W/LOWER EXTREMITY;  Surgeon: [name], MD;  Location: [facility];  Service: Cardiovascular;  Laterality: N/A;  Lt Leg  . ABDOMINAL HYSTERECTOMY    . ANTERIOR REPAIR/ Show Low PUBOVAGINAL SLING  05-03-2002  . BUNIONECTOMY WITH HAMMERTOE RECONSTRUCTION Right 03/01/2013   Procedure: RIGHT FOOT EXCISION BUNIONETTE AND FUSION OF DIP FOURTH TOE;  Surgeon: [name], MD;  Location: [facility];  Service: Orthopedics;  Laterality: Right;  . CARDIAC CATHETERIZATION  02-03-2009  DR  [name]   SINGLE-VESSEL OBSTRUCTIVE CAD, SMALL DIAGONAL BRANCH/ NORMAL LVF/ SEVERE MITRAL INSUFFICIENCY/ SEVERE PULMONARY HYPERTENSION  . CATARACT EXTRACTION W/ INTRAOCULAR LENS  IMPLANT, BILATERAL  2003  . COLONOSCOPY  07/25/2014   few mall scattered diverticula in the ectire examined colon. Small internal hemmorhoids.   Marland Kitchen HAMMER TOE SURGERY  02/28/2012   Procedure: HAMMER TOE CORRECTION;  Surgeon: [name], MD;  Location[facility];  Service: Orthopedics;  Laterality: Right;  FUSION OF PROXIMAL INTERPHALANGEAL  RIGHT THIRD CLAW TOE  . PARS PLANA VITRECTOMY W/ REPAIR OF MACULAR HOLE  02-04-2002   LEFT  EYE  . PERIPHERAL VASCULAR BALLOON ANGIOPLASTY  04/29/2020   Procedure: PERIPHERAL VASCULAR BALLOON ANGIOPLASTY;  Surgeon: [name], MD;  Location: [facility];  Service: Cardiovascular;;  Lt. AT  . REPAIR RIGHT SECOND CLAW TOE/ VARUS MEDIAL CLOSING WEDGE OSTEOTOMY PROXIMAL PHALANX RIGHT GREAT TOE  05-05-2005  . TOTAL HIP ARTHROPLASTY     BILATERAL---  LEFT 4/98;   RIGHT  11/98  . VARUS OSTEOTOMY PROXIMAL PHALANX, LEFT GREAT TOE/ PARING DOWN THE CALLUS , SECOND LEFT TOE  10-27-2008   Family History  Problem Relation Age of Onset  . Heart disease Mother 15  . Prostate cancer Father 60   Social History   Socioeconomic History  . Marital status: Divorced    Spouse name: Not on file  . Number of children: 3  . Years of education: College  . Highest education level: Not on file  Occupational History  . Occupation:  Retired    Fish farm manager: RETIRED  Tobacco Use  . Smoking status: Never Smoker  . Smokeless tobacco: Never Used  Vaping Use  . Vaping Use: Never used  Substance and Sexual Activity  . Alcohol use: Not Currently    Comment: occasional  . Drug use: Yes    Types: Hydrocodone  . Sexual activity: Not Currently  Other Topics Concern  . Not on file  Social History Narrative   Patient lives at home alone.   Caffeine Use: 1 cup daily occasionally   Social Determinants of Health   Financial Resource Strain: Low Risk   . Difficulty of Paying Living Expenses: Not hard at all  Food Insecurity: No Food Insecurity  . Worried About Charity fundraiser in the Last Year: Never true  . Ran Out of Food in the Last Year: Never true  Transportation Needs: No Transportation Needs  . Lack of Transportation (Medical): No  . Lack of Transportation (Non-Medical): No  Physical Activity: Inactive  . Days of Exercise per Week: 0 days  . Minutes of Exercise per Session: 0 min  Stress: No Stress Concern Present  . Feeling of Stress : Not at all  Social Connections:   .  Frequency of Communication with Friends and Family:   . Frequency of Social Gatherings with Friends and Family:   . Attends Religious Services:   . Active Member of Clubs or Organizations:   . Attends Archivist Meetings:   Marland Kitchen Marital Status:     Tobacco Counseling Counseling given: Not Answered   Clinical Intake:  Pre-visit preparation completed: Yes  Pain : 0-10 Pain Score: 5  Pain Type: Chronic pain Pain Location: Ankle Pain Orientation: Right, Left Pain Descriptors / Indicators: Contraction Pain Onset: More than a month ago Pain Frequency: Intermittent     Nutritional Status: BMI 25 -29 Overweight Nutritional Risks: None Diabetes: No  How often do you need to have someone help you when you read instructions, pamphlets, or other written materials from your doctor or pharmacy?: 1 - Never What is the last grade level you completed in school?: 12th grade  Diabetic? no  Interpreter Needed?: No  Information entered by :: NAllen LPN   Activities of Daily Living In your present state of health, do you have any difficulty performing the following activities: 05/13/2020  Hearing? N  Vision? N  Difficulty concentrating or making decisions? N  Walking or climbing stairs? N  Dressing or bathing? N  Doing errands, shopping? N  Preparing Food and eating ? N  Using the Toilet? N  In the past six months, have you accidently leaked urine? Y  Comment incontinence  Do you have problems with loss of bowel control? N  Managing your Medications? N  Managing your Finances? N  Housekeeping or managing your Housekeeping? N  Some recent data might be hidden    Patient Care Team: Minette Brine, FNP as PCP - General (General Practice) Monna Fam, MD as Consulting Physician (Ophthalmology)  Indicate any recent Medical Services you may have received from other than [facility] providers in the past year (date may be approximate).     Assessment:   This is a routine  wellness examination for [place].  Hearing/Vision screen  Hearing Screening   125Hz  250Hz  500Hz  1000Hz  2000Hz  3000Hz  4000Hz  6000Hz  8000Hz   Right ear:           Left ear:           Vision Screening Comments: Regular eye  exams, Dr. Herbert Deaner   Dietary issues and exercise activities discussed: Current Exercise Habits: The patient does not participate in regular exercise at present  Goals    . Patient Stated     No goals    . Patient Stated     05/13/2020, no goals      Depression Screen PHQ 2/9 Scores 05/13/2020 11/13/2019 11/13/2019 05/08/2019 05/08/2019 05/08/2019 05/08/2019  PHQ - 2 Score 0 0 0 0 0 0 0  PHQ- 9 Score 0 0 - 1 - - -    Fall Risk Fall Risk  05/13/2020 11/13/2019 05/08/2019 05/08/2019 05/08/2019  Falls in the past year? 0 0 0 0 0  Risk for fall due to : Impaired balance/gait;Medication side effect;Impaired mobility - Impaired balance/gait;Medication side effect - -  Follow up Falls evaluation completed;Education provided;Falls prevention discussed - Falls evaluation completed;Education provided;Falls prevention discussed - -    Any stairs in or around the home? No  If so, are there any without handrails? n/a Home free of loose throw rugs in walkways, pet beds, electrical cords, etc? Yes  Adequate lighting in your home to reduce risk of falls? Yes   ASSISTIVE DEVICES UTILIZED TO PREVENT FALLS:  Life alert? No  Use of a cane, walker or w/c? Yes  Grab bars in the bathroom? Yes  Shower chair or bench in shower? Yes  Elevated toilet seat or a handicapped toilet? No   TIMED UP AND GO:  Was the test performed? No .   Gait steady and fast without use of assistive device  Cognitive Function: MMSE - Mini Mental State Exam 05/08/2019 05/08/2019 05/08/2019  Orientation to time 5 - 5  Orientation to Place 5 - -  Registration 3 - -  Attention/ Calculation 5 - -  Recall 3 - -  Language- repeat 1 1 -  Language- read & follow direction 1 1 -  Copy design 1 - -     6CIT Screen 05/13/2020  05/08/2019  What Year? 0 points 0 points  What month? 0 points 0 points  What time? 0 points 0 points  Count back from 20 0 points 0 points  Months in reverse 0 points 2 points  Repeat phrase 2 points 0 points  Total Score 2 2    Immunizations Immunization History  Administered Date(s) Administered  . DTaP 04/18/2011  . Influenza, High Dose Seasonal PF 08/07/2018  . Influenza,inj,Quad PF,6+ Mos 08/23/2013  . Influenza-Unspecified 08/23/2013, 07/08/2014, 08/15/2019  . Moderna SARS-COVID-2 Vaccination 12/13/2019, 01/10/2020  . Pneumococcal Conjugate-13 12/27/2013, 08/12/2019  . Pneumococcal Polysaccharide-23 08/03/2010  . Zoster 04/11/2017  . Zoster Recombinat (Shingrix) 08/12/2019    TDAP status: Up to date Flu Vaccine status: Up to date Pneumococcal vaccine status: Up to date Covid-19 vaccine status: Completed vaccines  Qualifies for Shingles Vaccine? Yes   Zostavax completed Yes   Shingrix Completed?: Yes  Screening Tests Health Maintenance  Topic Date Due  . INFLUENZA VACCINE  06/07/2020  . TETANUS/TDAP  04/17/2021  . DEXA SCAN  Completed  . COVID-19 Vaccine  Completed  . PNA vac Low Risk Adult  Completed    Health Maintenance  There are no preventive care reminders to display for this patient.  Colorectal cancer screening: No longer required.  Mammogram status: Completed 11/28/2019. Repeat every year Bone Density status: Completed 07/08/2019.  Lung Cancer Screening: (Low Dose CT Chest recommended if Age 62-80 years, 30 pack-year currently smoking OR have quit w/in 15years.) does not qualify.   Lung Cancer Screening  Referral: no  Additional Screening:  Hepatitis C Screening: does not qualify;   Vision Screening: Recommended annual ophthalmology exams for early detection of glaucoma and other disorders of the eye. Is the patient up to date with their annual eye exam?  Yes  Who is the provider or what is the name of the office in which the patient attends  annual eye exams? Dr. Herbert Deaner If pt is not established with a provider, would they like to be referred to a provider to establish care? No .   Dental Screening: Recommended annual dental exams for proper oral hygiene  Community Resource Referral / Chronic Care Management: CRR required this visit?  No   CCM required this visit?  No      Plan:     I have personally reviewed and noted the following in the patient's chart:   . Medical and social history . Use of alcohol, tobacco or illicit drugs  . Current medications and supplements . Functional ability and status . Nutritional status . Physical activity . Advanced directives . List of other physicians . Hospitalizations, surgeries, and ER visits in previous 12 months . Vitals . Screenings to include cognitive, depression, and falls . Referrals and appointments  In addition, I have reviewed and discussed with patient certain preventive protocols, quality metrics, and best practice recommendations. A written personalized care plan for preventive services as well as general preventive health recommendations were provided to patient.     Kellie Simmering, LPN   03/16/2923   Nurse Notes:

## 2020-05-13 NOTE — Progress Notes (Addendum)
Subjective:     Patient ID: [name] , female    DOB: 02-Aug-1932 , 86 y.o.   MRN: [id number]   Chief Complaint  Patient presents with  . Hypertension  . Diabetes  . Hyperlipidemia    HPI  She presents today for a follow up for her pre diabetes. She is not as compliant with her physical activity. She does walk around and run errands. She drinks water with at least 64 ounces. She has an appointment with Dr. Fletcher Anon on July 13th. She is reporting swelling in her right leg on her ankle on the right side. She reports pain just swelling but is concerned. She is reporting having to use the spiriva lately. She is eating junk foods that are salted and is encouraged to use moderation. She otherwise has no other complaints.    Hypertension This is a chronic problem. The current episode started more than 1 year ago. The problem is unchanged. The problem is controlled. Pertinent negatives include no chest pain, headaches, palpitations or shortness of breath. There are no associated agents to hypertension. Risk factors for coronary artery disease include sedentary lifestyle. There are no compliance problems.  There is no history of angina. There is no history of chronic renal disease.  Diabetes She presents for her follow-up diabetic visit. Diabetes type: prediabetes. Pertinent negatives for hypoglycemia include no confusion, dizziness, headaches or nervousness/anxiousness. There are no diabetic associated symptoms. Pertinent negatives for diabetes include no chest pain, no fatigue and no weakness. There are no hypoglycemic complications. Symptoms are stable. There are no diabetic complications. Risk factors for coronary artery disease include hypertension. She is compliant with treatment all of the time. When asked about meal planning, she reported none. She has not had a previous visit with a dietitian. She rarely participates in exercise. She does not see a podiatrist.Eye exam is not current.   Hyperlipidemia This is a chronic problem. The current episode started more than 1 year ago. The problem is controlled. Recent lipid tests were reviewed and are normal. She has no history of chronic renal disease. Pertinent negatives include no chest pain or shortness of breath.     Past Medical History:  Diagnosis Date  . Anemia   . Anticoagulated on Coumadin    CHRONIC  . Arthritis   . Back pain, chronic   . Chronic atrial fibrillation ([place])   . Coronary artery disease CARDIOLOGIST- DR [name]--  LOV IN EPIC  . Depression   . Hammer toe   . Hypercholesterolemia   . Hypertension   . Hypertensive heart disease    WITH LVH  . LVH (left ventricular hypertrophy) due to hypertensive disease   . Mitral insufficiency    CHRONIC  . Pulmonary hypertension ([place])   . Transfusion history      Family History  Problem Relation Age of Onset  . Heart disease Mother 67  . Prostate cancer Father 53     Current Outpatient Medications:  .  albuterol (VENTOLIN HFA) 108 (90 Base) MCG/ACT inhaler, Inhale 2 puffs into the lungs every 6 (six) hours as needed for wheezing or shortness of breath. (Patient not taking: Reported on 05/13/2020), Disp: 1 g, Rfl: 6 .  ascorbic acid (VITAMIN C) 500 MG tablet, Take 500 mg by mouth daily. , Disp: , Rfl:  .  atorvastatin (LIPITOR) 40 MG tablet, Take 1 tablet (40 mg total) by mouth daily., Disp: 90 tablet, Rfl: 3 .  Biotin 1000 MCG tablet, Take 1 tablet by  mouth daily. , Disp: , Rfl:  .  Calcium Carbonate-Vitamin D (CALCIUM + D PO), Take by mouth daily.  , Disp: , Rfl:  .  Cholecalciferol (VITAMIN D PO), Take by mouth daily.  , Disp: , Rfl:  .  Cyanocobalamin (VITAMIN B-12 PO), Take 1 tablet by mouth daily. , Disp: , Rfl:  .  diltiazem (TIAZAC) 120 MG 24 hr capsule, TAKE ONE CAPSULE BY MOUTH EVERY DAY, Disp: 30 capsule, Rfl: 3 .  estrogens, conjugated, (PREMARIN) 1.25 MG tablet, Take 1.25 mg by mouth daily. Takes 3 x week Mon-Wed-Fri, Disp: , Rfl:  .  folic acid  (FOLVITE) 1 MG tablet, Take 1 mg by mouth daily., Disp: , Rfl:  .  furosemide (LASIX) 40 MG tablet, TAKE 1/2 TABLET BY MOUTH DAILY (Patient taking differently: Take 20 mg by mouth every other day. ), Disp: 45 tablet, Rfl: 1 .  gabapentin (NEURONTIN) 300 MG capsule, Take 600 mg by mouth at bedtime. , Disp: , Rfl:  .  HYDROcodone-acetaminophen (NORCO) 10-325 MG per tablet, Take 1 tablet by mouth every 6 (six) hours as needed for moderate pain. , Disp: , Rfl:  .  lisinopril (ZESTRIL) 20 MG tablet, Take 1 tablet (20 mg total) by mouth daily., Disp: 90 tablet, Rfl: 0 .  Multiple Vitamins-Minerals (PRESERVISION AREDS 2 PO), Take 1 capsule by mouth in the morning and at bedtime., Disp: , Rfl:  .  pantoprazole (PROTONIX) 40 MG tablet, Take 40 mg by mouth daily., Disp: , Rfl:  .  potassium chloride SA (KLOR-CON) 20 MEQ tablet, TAKE ONE TABLET BY MOUTH TWICE DAILY (Patient taking differently: Take 20 mEq by mouth 2 (two) times daily. ), Disp: 60 tablet, Rfl: 10 .  sertraline (ZOLOFT) 50 MG tablet, Take 75 mg by mouth daily. , Disp: , Rfl:  .  Tiotropium Bromide Monohydrate (SPIRIVA RESPIMAT) 2.5 MCG/ACT AERS, Inhale 2 puffs into the lungs daily., Disp: 1 g, Rfl: 6 .  vitamin A 8000 UNIT capsule, Take 8,000 Units by mouth daily.  , Disp: , Rfl:  .  warfarin (COUMADIN) 5 MG tablet, Take 0.5-1 tablets (2.5-5 mg total) by mouth See admin instructions. 5 mg on Mondays and 2.5 mg all other days, Disp: 30 tablet, Rfl: 1   Allergies  Allergen Reactions  . Sulfanilamide     unkn  . Penicillins Rash     Review of Systems  Constitutional: Negative.  Negative for fatigue.  Eyes: Negative for visual disturbance.  Respiratory: Negative.  Negative for shortness of breath.   Cardiovascular: Positive for leg swelling (per patient ). Negative for chest pain and palpitations.  Gastrointestinal: Negative.   Endocrine: Negative.   Musculoskeletal: Negative.   Skin: Negative.   Neurological: Negative for dizziness,  weakness and headaches.  Psychiatric/Behavioral: Negative for confusion. The patient is not nervous/anxious.      Today's Vitals   05/13/20 1132  BP: (!) 150/90  Pulse: (!) 106  Temp: 98.4 F (36.9 C)  TempSrc: Oral  SpO2: 96%  Weight: 165 lb (74.8 kg)  Height: 5\' 4"  (1.626 m)   Body mass index is 28.32 kg/m.   Objective:  Physical Exam Vitals reviewed.  Constitutional:      Appearance: She is well-developed.  HENT:     Head: Normocephalic and atraumatic.  Eyes:     Pupils: Pupils are equal, round, and reactive to light.  Cardiovascular:     Rate and Rhythm: Normal rate. Rhythm irregular.     Pulses: Normal pulses.  Heart sounds: Murmur heard.   Pulmonary:     Effort: Pulmonary effort is normal.     Breath sounds: Normal breath sounds.  Musculoskeletal:        General: Normal range of motion.  Skin:    General: Skin is warm and dry.     Capillary Refill: Capillary refill takes less than 2 seconds.  Neurological:     General: No focal deficit present.     Mental Status: She is alert and oriented to person, place, and time.     Cranial Nerves: No cranial nerve deficit.  Psychiatric:        Mood and Affect: Mood normal.         Assessment And Plan:    1. Essential hypertension Chronic, blood pressure is elevated today Encouraged to stay well hydrated and avoid salt I will not make any changes to her medications this visit  2. Hyperlipidemia, unspecified hyperlipidemia type Chronic, stable Continue with current medications  3. Prediabetes Chronic, stable No current medications, diet controlled.  4. Chronic bronchitis with COPD (chronic obstructive pulmonary disease) (HCC) Stable, continue with inhalers   Marylu Lund, RN

## 2020-05-13 NOTE — Patient Instructions (Signed)
[name] , Thank you for taking time to come for your Medicare Wellness Visit. I appreciate your ongoing commitment to your health goals. Please review the following plan we discussed and let me know if I can assist you in the future.   Screening recommendations/referrals: Colonoscopy: not required Mammogram: completed 11/28/2019, due 11/27/2020 Bone Density: completed 07/08/2019 Recommended yearly ophthalmology/optometry visit for glaucoma screening and checkup Recommended yearly dental visit for hygiene and checkup  Vaccinations: Influenza vaccine: completed 08/15/2019, due 06/07/2020 Pneumococcal vaccine: completed 08/12/2019 Tdap vaccine: completed 04/18/2011, due 04/17/2021 Shingles vaccine: discussed   Covid-19:01/10/2020, 12/13/2019  Advanced directives: Please bring a copy of your POA (Power of Attorney) and/or Living Will to your next appointment.   Conditions/risks identified: none  Next appointment: Follow up in one year for your annual wellness visit    Preventive Care 65 Years and Older, Female Preventive care refers to lifestyle choices and visits with your health care provider that can promote health and wellness. What does preventive care include?  A yearly physical exam. This is also called an annual well check.  Dental exams once or twice a year.  Routine eye exams. Ask your health care provider how often you should have your eyes checked.  Personal lifestyle choices, including:  Daily care of your teeth and gums.  Regular physical activity.  Eating a healthy diet.  Avoiding tobacco and drug use.  Limiting alcohol use.  Practicing safe sex.  Taking low-dose aspirin every day.  Taking vitamin and mineral supplements as recommended by your health care provider. What happens during an annual well check? The services and screenings done by your health care provider during your annual well check will depend on your age, overall health, lifestyle risk factors,  and family history of disease. Counseling  Your health care provider may ask you questions about your:  Alcohol use.  Tobacco use.  Drug use.  Emotional well-being.  Home and relationship well-being.  Sexual activity.  Eating habits.  History of falls.  Memory and ability to understand (cognition).  Work and work Statistician.  Reproductive health. Screening  You may have the following tests or measurements:  Height, weight, and BMI.  Blood pressure.  Lipid and cholesterol levels. These may be checked every 5 years, or more frequently if you are over 60 years old.  Skin check.  Lung cancer screening. You may have this screening every year starting at age 96 if you have a 30-pack-year history of smoking and currently smoke or have quit within the past 15 years.  Fecal occult blood test (FOBT) of the stool. You may have this test every year starting at age 21.  Flexible sigmoidoscopy or colonoscopy. You may have a sigmoidoscopy every 5 years or a colonoscopy every 10 years starting at age 72.  Hepatitis C blood test.  Hepatitis B blood test.  Sexually transmitted disease (STD) testing.  Diabetes screening. This is done by checking your blood sugar (glucose) after you have not eaten for a while (fasting). You may have this done every 1-3 years.  Bone density scan. This is done to screen for osteoporosis. You may have this done starting at age 9.  Mammogram. This may be done every 1-2 years. Talk to your health care provider about how often you should have regular mammograms. Talk with your health care provider about your test results, treatment options, and if necessary, the need for more tests. Vaccines  Your health care provider may recommend certain vaccines, such as:  Influenza  vaccine. This is recommended every year.  Tetanus, diphtheria, and acellular pertussis (Tdap, Td) vaccine. You may need a Td booster every 10 years.  Zoster vaccine. You may need  this after age 68.  Pneumococcal 13-valent conjugate (PCV13) vaccine. One dose is recommended after age 60.  Pneumococcal polysaccharide (PPSV23) vaccine. One dose is recommended after age 19. Talk to your health care provider about which screenings and vaccines you need and how often you need them. This information is not intended to replace advice given to you by your health care provider. Make sure you discuss any questions you have with your health care provider. Document Released: 11/20/2015 Document Revised: 07/13/2016 Document Reviewed: 08/25/2015 Elsevier Interactive Patient Education  2017 [place] Prevention in the Home Falls can cause injuries. They can happen to people of all ages. There are many things you can do to make your home safe and to help prevent falls. What can I do on the outside of my home?  Regularly fix the edges of walkways and driveways and fix any cracks.  Remove anything that might make you trip as you walk through a door, such as a raised step or threshold.  Trim any bushes or trees on the path to your home.  Use bright outdoor lighting.  Clear any walking paths of anything that might make someone trip, such as rocks or tools.  Regularly check to see if handrails are loose or broken. Make sure that both sides of any steps have handrails.  Any raised decks and porches should have guardrails on the edges.  Have any leaves, snow, or ice cleared regularly.  Use sand or salt on walking paths during winter.  Clean up any spills in your garage right away. This includes oil or grease spills. What can I do in the bathroom?  Use night lights.  Install grab bars by the toilet and in the tub and shower. Do not use towel bars as grab bars.  Use non-skid mats or decals in the tub or shower.  If you need to sit down in the shower, use a plastic, non-slip stool.  Keep the floor dry. Clean up any water that spills on the floor as soon as it  happens.  Remove soap buildup in the tub or shower regularly.  Attach bath mats securely with double-sided non-slip rug tape.  Do not have throw rugs and other things on the floor that can make you trip. What can I do in the bedroom?  Use night lights.  Make sure that you have a light by your bed that is easy to reach.  Do not use any sheets or blankets that are too big for your bed. They should not hang down onto the floor.  Have a firm chair that has side arms. You can use this for support while you get dressed.  Do not have throw rugs and other things on the floor that can make you trip. What can I do in the kitchen?  Clean up any spills right away.  Avoid walking on wet floors.  Keep items that you use a lot in easy-to-reach places.  If you need to reach something above you, use a strong step stool that has a grab bar.  Keep electrical cords out of the way.  Do not use floor polish or wax that makes floors slippery. If you must use wax, use non-skid floor wax.  Do not have throw rugs and other things on the floor that can  make you trip. What can I do with my stairs?  Do not leave any items on the stairs.  Make sure that there are handrails on both sides of the stairs and use them. Fix handrails that are broken or loose. Make sure that handrails are as long as the stairways.  Check any carpeting to make sure that it is firmly attached to the stairs. Fix any carpet that is loose or worn.  Avoid having throw rugs at the top or bottom of the stairs. If you do have throw rugs, attach them to the floor with carpet tape.  Make sure that you have a light switch at the top of the stairs and the bottom of the stairs. If you do not have them, ask someone to add them for you. What else can I do to help prevent falls?  Wear shoes that:  Do not have high heels.  Have rubber bottoms.  Are comfortable and fit you well.  Are closed at the toe. Do not wear sandals.  If you  use a stepladder:  Make sure that it is fully opened. Do not climb a closed stepladder.  Make sure that both sides of the stepladder are locked into place.  Ask someone to hold it for you, if possible.  Clearly mark and make sure that you can see:  Any grab bars or handrails.  First and last steps.  Where the edge of each step is.  Use tools that help you move around (mobility aids) if they are needed. These include:  Canes.  Walkers.  Scooters.  Crutches.  Turn on the lights when you go into a dark area. Replace any light bulbs as soon as they burn out.  Set up your furniture so you have a clear path. Avoid moving your furniture around.  If any of your floors are uneven, fix them.  If there are any pets around you, be aware of where they are.  Review your medicines with your doctor. Some medicines can make you feel dizzy. This can increase your chance of falling. Ask your doctor what other things that you can do to help prevent falls. This information is not intended to replace advice given to you by your health care provider. Make sure you discuss any questions you have with your health care provider. Document Released: 08/20/2009 Document Revised: 03/31/2016 Document Reviewed: 11/28/2014 Elsevier Interactive Patient Education  2017 [employer].

## 2020-05-14 ENCOUNTER — Encounter: Payer: Self-pay | Admitting: Nurse Practitioner

## 2020-05-18 ENCOUNTER — Other Ambulatory Visit: Payer: Self-pay | Admitting: Cardiology

## 2020-05-18 ENCOUNTER — Other Ambulatory Visit: Payer: Self-pay

## 2020-05-18 MED ORDER — LISINOPRIL 20 MG PO TABS: 20.0000 mg | ORAL_TABLET | Freq: Every day | ORAL | 0 refills | Status: DC

## 2020-05-19 ENCOUNTER — Other Ambulatory Visit: Payer: Self-pay

## 2020-05-19 ENCOUNTER — Encounter: Payer: Self-pay | Admitting: Cardiovascular Disease

## 2020-05-19 ENCOUNTER — Ambulatory Visit (INDEPENDENT_AMBULATORY_CARE_PROVIDER_SITE_OTHER): Payer: Medicare Other | Admitting: Cardiovascular Disease

## 2020-05-19 VITALS — BP 160/90 | HR 95 | Ht 66.0 in | Wt 165.0 lb

## 2020-05-19 DIAGNOSIS — R0602 Shortness of breath: Secondary | ICD-10-CM | POA: Diagnosis not present

## 2020-05-19 DIAGNOSIS — I739 Peripheral vascular disease, unspecified: Secondary | ICD-10-CM | POA: Diagnosis not present

## 2020-05-19 DIAGNOSIS — Z5181 Encounter for therapeutic drug level monitoring: Secondary | ICD-10-CM

## 2020-05-19 DIAGNOSIS — I10 Essential (primary) hypertension: Secondary | ICD-10-CM

## 2020-05-19 DIAGNOSIS — I482 Chronic atrial fibrillation, unspecified: Secondary | ICD-10-CM

## 2020-05-19 LAB — CBC
Hematocrit: 38.1 % (ref 34.0–46.6)
Hemoglobin: 12.2 g/dL (ref 11.1–15.9)
MCH: 29.1 pg (ref 26.6–33.0)
MCHC: 32 g/dL (ref 31.5–35.7)
MCV: 91 fL (ref 79–97)
Platelets: 282 10*3/uL (ref 150–450)
RBC: 4.19 x10E6/uL (ref 3.77–5.28)
RDW: 14.5 % (ref 11.7–15.4)
WBC: 5.4 10*3/uL (ref 3.4–10.8)

## 2020-05-19 LAB — BASIC METABOLIC PANEL WITH GFR
BUN/Creatinine Ratio: 16 (ref 12–28)
BUN: 14 mg/dL (ref 8–27)
CO2: 23 mmol/L (ref 20–29)
Calcium: 9.9 mg/dL (ref 8.7–10.3)
Chloride: 102 mmol/L (ref 96–106)
Creatinine, Ser: 0.85 mg/dL (ref 0.57–1.00)
GFR calc Af Amer: 72 mL/min/1.73
GFR calc non Af Amer: 62 mL/min/1.73
Glucose: 105 mg/dL — ABNORMAL HIGH (ref 65–99)
Potassium: 4.5 mmol/L (ref 3.5–5.2)
Sodium: 139 mmol/L (ref 134–144)

## 2020-05-19 LAB — PRO B NATRIURETIC PEPTIDE: NT-Pro BNP: 665 pg/mL (ref 0–738)

## 2020-05-19 NOTE — Patient Instructions (Signed)
Medication Instructions:   INCREASE FUROSEMIDE TO 20 MG ONCE EVERYDAY X 1 WEEK= 1/2 TABLET DAILY X 1 WEEK THEN DECREASE BACK TO 1/2 TABLET EVERY OTHER DAY.  *If you need a refill on your cardiac medications before your next appointment, please call your pharmacy*   Lab Work:  Your physician recommends that you HAVE LAB WORK TODAY  If you have labs (blood work) drawn today and your tests are completely normal, you will receive your results only by: Marland Kitchen MyChart Message (if you have MyChart) OR . A paper copy in the mail If you have any lab test that is abnormal or we need to change your treatment, we will call you to review the results.   Follow-Up: At [facility], you and your health needs are our priority.  As part of our continuing mission to provide you with exceptional heart care, we have created designated Provider Care Teams.  These Care Teams include your primary Cardiologist (physician) and Advanced Practice Providers (APPs -  Physician Assistants and Nurse Practitioners) who all work together to provide you with the care you need, when you need it.  We recommend signing up for the patient portal called "MyChart".  Sign up information is provided on this After Visit Summary.  MyChart is used to connect with patients for Virtual Visits (Telemedicine).  Patients are able to view lab/test results, encounter notes, upcoming appointments, etc.  Non-urgent messages can be sent to your provider as well.   To learn more about what you can do with MyChart, go to [email or web address].    Your next appointment:   2 month(s)  The format for your next appointment:   In Person  Provider:   Kathlyn Sacramento, MD

## 2020-05-19 NOTE — Progress Notes (Signed)
Cardiology Office Note   Date:  05/19/2020   ID:  [name], DOB 1932/05/07, MRN [id number]  PCP:  Minette Brine, FNP  Cardiologist: Dr. [name]  Chief Complaint  Patient presents with   Edema    BILATERAL LEGS      History of Present Illness: [name] is a 86 y.o. female who is here today for follow-up visit regarding peripheral arterial disease.  She has known history of chronic atrial fibrillation, hypertensive heart disease, COPD, moderate mitral regurgitation and pulmonary hypertension. The patient is accompanied today by her daughter.  She developed an ulceration on the lateral side of the left foot 2 to 3 months ago that has not healed since then.  She has been seeing Dr. Cannon Kettle in podiatry.  She reports significant discomfort at the ulceration but has no claudication.  Her physical mobility is somewhat limited.  She has no symptoms on the right side.  She has chronic exertional dyspnea but no chest pain.  She underwent noninvasive vascular evaluation.  ABI was unreliable due to medial calcifications.  Left ABI was 0.42.  Left lower extremity duplex showed no significant common femoral or SFA disease.  There was borderline significant stenosis in the TP trunk with one-vessel runoff via the peroneal artery.  I proceeded with angiography last month which showed no significant aortoiliac disease.  There was mild to moderate left SFA and popliteal disease with one-vessel runoff below the knee via the peroneal artery which was free of significant disease.  There was mild disease in the TP trunk.  The anterior tibial and posterior tibial arteries were both occluded with reconstitution distally via the communicating branches of the peroneal artery with intact pedal arch.  I attempted revascularization of the anterior tibial artery to give her more blood flow but was not able to cross the occlusion antegrade given the length of that.  Since the procedure, the patient  had worsening bilateral leg swelling especially on the left side.  She also fell on her left knee.  She has some ecchymosis in the left calf area.   Past Medical History:  Diagnosis Date   Anemia    Anticoagulated on Coumadin    CHRONIC   Arthritis    Back pain, chronic    Chronic atrial fibrillation (HCC)    Coronary artery disease CARDIOLOGIST- DR [name]--  LOV IN EPIC   Depression    Hammer toe    Hypercholesterolemia    Hypertension    Hypertensive heart disease    WITH LVH   LVH (left ventricular hypertrophy) due to hypertensive disease    Mitral insufficiency    CHRONIC   Pulmonary hypertension ([place])    Transfusion history     Past Surgical History:  Procedure Laterality Date   ABDOMINAL AORTOGRAM W/LOWER EXTREMITY N/A 04/29/2020   Procedure: ABDOMINAL AORTOGRAM W/LOWER EXTREMITY;  Surgeon: [name], MD;  Location: [place] [facility];  Service: Cardiovascular;  Laterality: N/A;  Lt Leg   ABDOMINAL HYSTERECTOMY     ANTERIOR REPAIR/ [place] PUBOVAGINAL SLING  05-03-2002   BUNIONECTOMY WITH HAMMERTOE RECONSTRUCTION Right 03/01/2013   Procedure: RIGHT FOOT EXCISION BUNIONETTE AND FUSION OF DIP FOURTH TOE;  Surgeon: [name], MD;  Location: [place];  Service: Orthopedics;  Laterality: Right;   CARDIAC CATHETERIZATION  02-03-2009  DR  [name]   SINGLE-VESSEL OBSTRUCTIVE CAD, SMALL DIAGONAL BRANCH/ NORMAL LVF/ SEVERE MITRAL INSUFFICIENCY/ SEVERE PULMONARY HYPERTENSION   CATARACT EXTRACTION W/ INTRAOCULAR LENS  IMPLANT, BILATERAL  2003   COLONOSCOPY  07/25/2014   few mall scattered diverticula in the ectire examined colon. Small internal hemmorhoids.    HAMMER TOE SURGERY  02/28/2012   Procedure: HAMMER TOE CORRECTION;  Surgeon: [name], MD;  Location: [facility];  Service: Orthopedics;  Laterality: Right;  FUSION OF PROXIMAL INTERPHALANGEAL  RIGHT THIRD CLAW TOE   PARS PLANA VITRECTOMY W/ REPAIR OF  MACULAR HOLE  02-04-2002   LEFT EYE   PERIPHERAL VASCULAR BALLOON ANGIOPLASTY  04/29/2020   Procedure: PERIPHERAL VASCULAR BALLOON ANGIOPLASTY;  Surgeon: [name], MD;  Location: [place] [facility];  Service: Cardiovascular;;  Lt. AT   REPAIR RIGHT SECOND CLAW TOE/ VARUS MEDIAL CLOSING WEDGE OSTEOTOMY PROXIMAL PHALANX RIGHT GREAT TOE  05-05-2005   TOTAL HIP ARTHROPLASTY     BILATERAL---  LEFT 4/98;   RIGHT  11/98   VARUS OSTEOTOMY PROXIMAL PHALANX, LEFT GREAT TOE/ PARING DOWN THE CALLUS , SECOND LEFT TOE  10-27-2008     Current Outpatient Medications  Medication Sig Dispense Refill   ascorbic acid (VITAMIN C) 500 MG tablet Take 500 mg by mouth daily.      atorvastatin (LIPITOR) 40 MG tablet Take 1 tablet (40 mg total) by mouth daily. 90 tablet 3   Biotin 1000 MCG tablet Take 1 tablet by mouth daily.      Calcium Carbonate-Vitamin D (CALCIUM + D PO) Take by mouth daily.       Cholecalciferol (VITAMIN D PO) Take by mouth daily.       Cyanocobalamin (VITAMIN B-12 PO) Take 1 tablet by mouth daily.      diltiazem (TIAZAC) 120 MG 24 hr capsule TAKE ONE CAPSULE BY MOUTH EVERY DAY 30 capsule 3   folic acid (FOLVITE) 1 MG tablet Take 1 mg by mouth daily.     furosemide (LASIX) 40 MG tablet TAKE 1/2 TABLET BY MOUTH DAILY (Patient taking differently: Take 20 mg by mouth every other day. ) 45 tablet 1   gabapentin (NEURONTIN) 300 MG capsule Take 600 mg by mouth at bedtime.      HYDROcodone-acetaminophen (NORCO) 10-325 MG per tablet Take 1 tablet by mouth every 6 (six) hours as needed for moderate pain.      lisinopril (ZESTRIL) 20 MG tablet Take 1 tablet (20 mg total) by mouth daily. 90 tablet 0   pantoprazole (PROTONIX) 40 MG tablet Take 40 mg by mouth daily.     potassium chloride SA (KLOR-CON) 20 MEQ tablet TAKE ONE TABLET BY MOUTH TWICE DAILY (Patient taking differently: Take 20 mEq by mouth 2 (two) times daily. ) 60 tablet 10   sertraline (ZOLOFT) 50 MG tablet Take 75  mg by mouth daily.      Tiotropium Bromide Monohydrate (SPIRIVA RESPIMAT) 2.5 MCG/ACT AERS Inhale 2 puffs into the lungs daily. 1 g 6   vitamin A 8000 UNIT capsule Take 8,000 Units by mouth daily.       warfarin (COUMADIN) 5 MG tablet Take 0.5-1 tablets (2.5-5 mg total) by mouth See admin instructions. 5 mg on Mondays and 2.5 mg all other days 30 tablet 1   No current facility-administered medications for this visit.    Allergies:   Sulfanilamide and Penicillins    Social History:  The patient  reports that she has never smoked. She has never used smokeless tobacco. She reports previous alcohol use. She reports current drug use. Drug: Hydrocodone.   Family History:  The patient's family history includes Heart disease (  age of onset: 103) in her mother; Prostate cancer (age of onset: 86) in her father.    ROS:  Please see the history of present illness.   Otherwise, review of systems are positive for none.   All other systems are reviewed and negative.    PHYSICAL EXAM: VS:  BP (!) 160/90 (BP Location: Right Arm, Patient Position: Sitting, Cuff Size: Normal)    Pulse 95    Ht 5\' 6"  (1.676 m)    Wt 165 lb (74.8 kg)    SpO2 99%    BMI 26.63 kg/m  , BMI Body mass index is 26.63 kg/m. GEN: Well nourished, well developed, in no acute distress  HEENT: normal  Neck: no JVD, carotid bruits, or masses Cardiac: Irregularly irregular; no murmurs, rubs, or gallops, mild bilateral leg edema worse on the left side. Respiratory:  clear to auscultation bilaterally, normal work of breathing GI: soft, nontender, nondistended, + BS MS: no deformity or atrophy  Skin: warm and dry, no rash Neuro:  Strength and sensation are intact Psych: euthymic mood, full affect Vascular: Distal pulses are not palpable bilaterally.  There is callus on the lateral side of the left foot with no significant drainage or open ulceration.     EKG:  EKG is not ordered today.   Recent Labs: 03/26/2020: ALT 16; TSH  2.340 04/21/2020: BUN 17; Creatinine, Ser 0.95; Hemoglobin 12.7; Platelets 322; Potassium 4.5; Sodium 140    Lipid Panel    Component Value Date/Time   CHOL 160 03/26/2020 1030   TRIG 134 03/26/2020 1030   HDL 44 03/26/2020 1030   CHOLHDL 3.6 03/26/2020 1030   CHOLHDL 2 01/26/2012 0924   VLDL 18.2 01/26/2012 0924   LDLCALC 92 03/26/2020 1030      Wt Readings from Last 3 Encounters:  05/19/20 165 lb (74.8 kg)  05/13/20 165 lb (74.8 kg)  05/13/20 165 lb (74.8 kg)       No flowsheet data found.    ASSESSMENT AND PLAN:  1.  Peripheral arterial disease: The patient has callus on the lateral side of the left foot and she had localized ulceration.  But that the area has dried up completely.  Recent angiography showed inline flow via the peroneal artery.  Attempted recanalization of the anterior tibial artery was not successful due to inability to cross the long occlusion antegrade.  I do think the patient has reasonable blood distally to the left foot via the peroneal artery. I am going to continue close follow-up and as long as there is no worsening, the plan is to treat him medically.  Otherwise, we will have to attempt revascularization of the anterior tibial artery in the retrograde fashion. She does have some mild ecchymosis of the left calf likely related to microperforation in the anterior tibial artery after attempted revascularization.  This seems to be stable overall.  2.  Chronic atrial fibrillation: Ventricular rate is reasonably controlled.  She is on long-term anticoagulation with warfarin which will be held for the procedure.  3.  Essential hypertension: Blood pressure is reasonably controlled.  4.  Hyperlipidemia: Currently on atorvastatin 40 mg daily.  Most recent lipid profile showed an LDL of 77.  5.  Bilateral leg edema: check routine labs today.  In addition, I asked her to take furosemide 20 mg daily for a week and then go back to every other  day.    Disposition:   FU with me in 2 month  Signed,  Kathlyn Sacramento, MD  05/19/2020 9:25 AM    [facility]

## 2020-05-20 ENCOUNTER — Telehealth: Payer: Self-pay | Admitting: Cardiology

## 2020-05-20 NOTE — Telephone Encounter (Signed)
Rx(s) sent to pharmacy electronically.  

## 2020-05-20 NOTE — Telephone Encounter (Signed)
Patient states she is returning a call from yesterday. I did not see a note. 

## 2020-05-20 NOTE — Telephone Encounter (Signed)
Left a message for the patient to let her know that there as no documentation about a call yesterday and she did not have to call us back unless she had any concerns.

## 2020-05-21 ENCOUNTER — Encounter: Payer: Self-pay | Admitting: *Deleted

## 2020-05-25 ENCOUNTER — Telehealth: Payer: Self-pay | Admitting: Cardiology

## 2020-05-25 NOTE — Telephone Encounter (Signed)
Patient calling to speak with Malachy Mood. She lost her lab orders and directions and would like them mailed to her. She does not know if she wants to come to [facility] office for lab orders. Address confirmed on mychart.

## 2020-05-25 NOTE — Telephone Encounter (Signed)
Left message for pt to call.

## 2020-05-25 NOTE — Telephone Encounter (Signed)
Follow up   Pt is also asking for lab order sheets from Dr. Fletcher Anon. If that can be mail to her as well

## 2020-05-26 NOTE — Telephone Encounter (Signed)
Albuterol is a rescue inhaler and doesn't really help underlying inflammation in lungs. We could ask pharmacy to look and see what maintenance inhalers may be more affordable.  Andrew Blasius [name] MD, [facility]

## 2020-05-26 NOTE — Telephone Encounter (Signed)
Message sent to pharmacist for advice. ?

## 2020-05-26 NOTE — Telephone Encounter (Signed)
Patient is returning call, requesting to speak with [facility] specifically.

## 2020-05-26 NOTE — Telephone Encounter (Signed)
Returned call to patient no answer.LMTC. 

## 2020-05-26 NOTE — Telephone Encounter (Signed)
Spoke to patient she stated Spiriva too expensive.She wanted to know if she can use Albuterol instead.Advised I will send message to [place].

## 2020-05-26 NOTE — Telephone Encounter (Signed)
Will route to [place].

## 2020-05-27 ENCOUNTER — Telehealth: Payer: Self-pay | Admitting: Cardiology

## 2020-05-27 DIAGNOSIS — Z79891 Long term (current) use of opiate analgesic: Secondary | ICD-10-CM | POA: Diagnosis not present

## 2020-05-27 DIAGNOSIS — G894 Chronic pain syndrome: Secondary | ICD-10-CM | POA: Diagnosis not present

## 2020-05-27 DIAGNOSIS — M545 Low back pain: Secondary | ICD-10-CM | POA: Diagnosis not present

## 2020-05-27 MED ORDER — ADVAIR HFA 45-21 MCG/ACT IN AERO
2.0000 | INHALATION_SPRAY | Freq: Two times a day (BID) | RESPIRATORY_TRACT | 6 refills | Status: DC
Start: 2020-05-27 — End: 2020-08-19

## 2020-05-27 NOTE — Telephone Encounter (Signed)
Pt returning nurse's phone call regarding medication. Please call back

## 2020-05-27 NOTE — Telephone Encounter (Signed)
Spoke to patient she stated Advair is same price as Spiriva.Advised I mailed her a [facility] card yesterday.She will call several pharmacies to get a better price.

## 2020-05-27 NOTE — Telephone Encounter (Signed)
Spoke to patient Dr.Jordan's advice given.She will see if Advair affordable.Prescription sent to pharmacy.

## 2020-05-27 NOTE — Telephone Encounter (Signed)
I would probably go with Advair if that is affordable to her. She stated Spiriva was too expensive. I don't think she has seen pulmonary in years.  Gaylon Bentz [name] MD, [facility]

## 2020-05-27 NOTE — Telephone Encounter (Signed)
Follow up  ° ° °Patient is returning call.  °

## 2020-05-27 NOTE — Telephone Encounter (Signed)
There are only a few generic inhalers available on the market.  Probably Advair (steroid/beta 2 agonist) would be the cheapest.  Can get from [facility] for $35-50.   She is on Spiriva which is an anticholinergic.   Not sure what severity her COPD is or what her pulmonologist's treatment plan is, so can't really recommend switching without their approval.

## 2020-05-27 NOTE — Telephone Encounter (Signed)
Returned call to patient no answer.LMTC. 

## 2020-06-19 ENCOUNTER — Telehealth: Payer: Self-pay | Admitting: Cardiology

## 2020-06-19 MED ORDER — WARFARIN SODIUM 5 MG PO TABS: 2.5000 mg | ORAL_TABLET | ORAL | 0 refills | Status: DC

## 2020-06-19 NOTE — Telephone Encounter (Signed)
    Pt would like to speak with Malachy Mood again, she said she have a question

## 2020-06-19 NOTE — Telephone Encounter (Signed)
Returned call to patient no answer.LMTC. 

## 2020-06-19 NOTE — Telephone Encounter (Signed)
Follow up: ° ° ° °Patient returning your call. Please call patient back. °

## 2020-06-19 NOTE — Telephone Encounter (Signed)
Spoke to patient she stated she would like a 90 day refill for Coumadin sent to [facility] in [place].Stated she has INRs done in our [place] office.Advised I will send message to our pharmacist for a refill.

## 2020-06-19 NOTE — Telephone Encounter (Signed)
Spoke to patient she stated she will continue spiriva.Stated Advair cost is about the same.Stated the good rx card is still expensive.Advised to call Medicare low income # [phone] for assistance.

## 2020-06-19 NOTE — Telephone Encounter (Signed)
Patient calling requesting [name] to call her back. Patient did not want to give any further information.

## 2020-06-24 NOTE — Telephone Encounter (Signed)
Patient is calling back to speak with Malachy Mood. She states she has an answer for her. Please call back.

## 2020-06-29 NOTE — Telephone Encounter (Signed)
Spoke to patient she stated she is not able to get any assistance for Spiriva.Stated she will pay her copay of $ 47.00.

## 2020-07-01 DIAGNOSIS — L309 Dermatitis, unspecified: Secondary | ICD-10-CM | POA: Diagnosis not present

## 2020-07-06 ENCOUNTER — Telehealth: Payer: Self-pay | Admitting: Cardiology

## 2020-07-06 DIAGNOSIS — E7800 Pure hypercholesterolemia, unspecified: Secondary | ICD-10-CM | POA: Diagnosis not present

## 2020-07-06 DIAGNOSIS — I739 Peripheral vascular disease, unspecified: Secondary | ICD-10-CM | POA: Diagnosis not present

## 2020-07-06 NOTE — Telephone Encounter (Signed)
Called patient back in an attempt to assist her but she states that she prefers to only speak with Malachy Mood at this time.

## 2020-07-06 NOTE — Telephone Encounter (Signed)
Spoke to patient she stated had lab work done this morning at [facility] in [place].Advised I will call her with results after Dr.Jordan reviews.

## 2020-07-06 NOTE — Telephone Encounter (Signed)
    Pt would like to speak with Malachy Mood, she said its not about medication about something else.

## 2020-07-07 ENCOUNTER — Ambulatory Visit (INDEPENDENT_AMBULATORY_CARE_PROVIDER_SITE_OTHER): Payer: Medicare Other

## 2020-07-07 ENCOUNTER — Other Ambulatory Visit: Payer: Self-pay

## 2020-07-07 DIAGNOSIS — I4891 Unspecified atrial fibrillation: Secondary | ICD-10-CM

## 2020-07-07 DIAGNOSIS — I482 Chronic atrial fibrillation, unspecified: Secondary | ICD-10-CM

## 2020-07-07 DIAGNOSIS — Z5181 Encounter for therapeutic drug level monitoring: Secondary | ICD-10-CM | POA: Diagnosis not present

## 2020-07-07 DIAGNOSIS — Z7901 Long term (current) use of anticoagulants: Secondary | ICD-10-CM | POA: Diagnosis not present

## 2020-07-07 LAB — LIPID PANEL
Chol/HDL Ratio: 3.4 ratio (ref 0.0–4.4)
Cholesterol, Total: 156 mg/dL (ref 100–199)
HDL: 46 mg/dL (ref >39)
LDL Chol Calc (NIH): 88 mg/dL (ref 0–99)
Triglycerides: 124 mg/dL (ref 0–149)
VLDL Cholesterol Cal: 22 mg/dL (ref 5–40)

## 2020-07-07 LAB — HEPATIC FUNCTION PANEL
ALT: 21 IU/L (ref 0–32)
AST: 25 IU/L (ref 0–40)
Albumin: 4.2 g/dL (ref 3.6–4.6)
Alkaline Phosphatase: 107 IU/L (ref 48–121)
Bilirubin Total: 0.3 mg/dL (ref 0.0–1.2)
Bilirubin, Direct: 0.11 mg/dL (ref 0.00–0.40)
Total Protein: 7 g/dL (ref 6.0–8.5)

## 2020-07-07 LAB — POCT INR: INR: 2.3 (ref 2.0–3.0)

## 2020-07-07 NOTE — Patient Instructions (Addendum)
Continue taking 1/2 tablet daily except 1 tablet on Mondays.  Repeat INR in 7 weeks  

## 2020-07-09 ENCOUNTER — Other Ambulatory Visit: Payer: Self-pay

## 2020-07-09 DIAGNOSIS — I739 Peripheral vascular disease, unspecified: Secondary | ICD-10-CM

## 2020-07-09 DIAGNOSIS — E7800 Pure hypercholesterolemia, unspecified: Secondary | ICD-10-CM

## 2020-07-09 DIAGNOSIS — E785 Hyperlipidemia, unspecified: Secondary | ICD-10-CM

## 2020-07-09 MED ORDER — ATORVASTATIN CALCIUM 80 MG PO TABS
80.0000 mg | ORAL_TABLET | Freq: Every day | ORAL | 3 refills | Status: DC
Start: 2020-07-09 — End: 2021-07-07

## 2020-07-09 NOTE — Telephone Encounter (Signed)
Spoke to patient lab results given. 

## 2020-07-09 NOTE — Telephone Encounter (Signed)
Called and spoke to patient. She states that she would like to speak to [place] only. She did not want to say why she was calling. Made patient aware that I will forward to [facility] to call her back.

## 2020-07-09 NOTE — Telephone Encounter (Signed)
Patient calling back to speak with Malachy Mood.

## 2020-07-17 ENCOUNTER — Telehealth: Payer: Self-pay | Admitting: Cardiology

## 2020-07-17 NOTE — Telephone Encounter (Signed)
Spoke to patient she stated she received a letter in mail from our office time to schedule appointment.Advised she has appointment already scheduled with Dr.Arida 9/21 at 10:00 am.

## 2020-07-17 NOTE — Telephone Encounter (Signed)
Patient called and states that she would like to speak to [place] only. She did not want to say why she was calling. Made patient aware that I will forward to [facility] to call her back.

## 2020-07-17 NOTE — Telephone Encounter (Signed)
Follow up: ° ° °Patient returning call  ° ° ° °

## 2020-07-17 NOTE — Telephone Encounter (Signed)
Returned call to patient no answer.LMTC. 

## 2020-07-21 ENCOUNTER — Other Ambulatory Visit: Payer: Self-pay | Admitting: Cardiovascular Disease

## 2020-07-21 ENCOUNTER — Ambulatory Visit: Payer: Medicare Other | Admitting: Legal Medicine

## 2020-07-21 NOTE — Telephone Encounter (Signed)
Refill request

## 2020-07-28 ENCOUNTER — Ambulatory Visit: Payer: Medicare Other | Admitting: Cardiovascular Disease

## 2020-08-10 ENCOUNTER — Telehealth: Payer: Self-pay | Admitting: Cardiology

## 2020-08-10 NOTE — Telephone Encounter (Signed)
Attempted to call patient, left message for patient to call back to office.   

## 2020-08-10 NOTE — Telephone Encounter (Signed)
    Pt would like to speak with Cheryl. 

## 2020-08-12 NOTE — Telephone Encounter (Signed)
Patient returning call. Wants to speak with Malachy Mood.

## 2020-08-12 NOTE — Telephone Encounter (Signed)
Spoke to patient she was calling to find out if she could get her flu vaccine here at office.Advised we have not received flu vaccines yet.Advised we only give flu vaccine if you have a Dr.appointment.Stated she will get one at her pharmacy.

## 2020-08-14 NOTE — Telephone Encounter (Signed)
Patient calling back to speak with Malachy Mood.

## 2020-08-14 NOTE — Telephone Encounter (Signed)
Spoke to patient she stated she only received 30 tablets of Coumadin.Stated she would like a 3 month supply.Advised I will send message to pharmacy.

## 2020-08-14 NOTE — Telephone Encounter (Signed)
Last time Rx was sent in August the prescription was written for 90 tablets.  Suggest she contact her pharmacy to find out why they are only giving her 40

## 2020-08-14 NOTE — Telephone Encounter (Signed)
Spoke to patient she called her pharmacy she was told her insurance will not approve 90 day supply of Coumadin.

## 2020-08-14 NOTE — Telephone Encounter (Signed)
Patient calling back.   °

## 2020-08-18 ENCOUNTER — Other Ambulatory Visit: Payer: Self-pay

## 2020-08-18 MED ORDER — LISINOPRIL 20 MG PO TABS: 20.0000 mg | ORAL_TABLET | Freq: Every day | ORAL | 0 refills | Status: DC

## 2020-08-19 ENCOUNTER — Encounter: Payer: Self-pay | Admitting: Legal Medicine

## 2020-08-19 ENCOUNTER — Ambulatory Visit (INDEPENDENT_AMBULATORY_CARE_PROVIDER_SITE_OTHER): Payer: Medicare Other | Admitting: Legal Medicine

## 2020-08-19 ENCOUNTER — Other Ambulatory Visit: Payer: Self-pay

## 2020-08-19 VITALS — BP 160/70 | HR 95 | Temp 97.7°F | Resp 16 | Ht 60.24 in | Wt 165.0 lb

## 2020-08-19 DIAGNOSIS — Z780 Asymptomatic menopausal state: Secondary | ICD-10-CM

## 2020-08-19 DIAGNOSIS — E785 Hyperlipidemia, unspecified: Secondary | ICD-10-CM | POA: Diagnosis not present

## 2020-08-19 DIAGNOSIS — Z23 Encounter for immunization: Secondary | ICD-10-CM

## 2020-08-19 DIAGNOSIS — E559 Vitamin D deficiency, unspecified: Secondary | ICD-10-CM

## 2020-08-19 DIAGNOSIS — R7303 Prediabetes: Secondary | ICD-10-CM | POA: Diagnosis not present

## 2020-08-19 DIAGNOSIS — Z79891 Long term (current) use of opiate analgesic: Secondary | ICD-10-CM

## 2020-08-19 DIAGNOSIS — I10 Essential (primary) hypertension: Secondary | ICD-10-CM | POA: Diagnosis not present

## 2020-08-19 NOTE — Patient Instructions (Signed)
Preventive Care 84 Years and Older, Female Preventive care refers to lifestyle choices and visits with your health care provider that can promote health and wellness. This includes:  A yearly physical exam. This is also called an annual well check.  Regular dental and eye exams.  Immunizations.  Screening for certain conditions.  Healthy lifestyle choices, such as diet and exercise. What can I expect for my preventive care visit? Physical exam Your health care provider will check:  Height and weight. These may be used to calculate body mass index (BMI), which is a measurement that tells if you are at a healthy weight.  Heart rate and blood pressure.  Your skin for abnormal spots. Counseling Your health care provider may ask you questions about:  Alcohol, tobacco, and drug use.  Emotional well-being.  Home and relationship well-being.  Sexual activity.  Eating habits.  History of falls.  Memory and ability to understand (cognition).  Work and work Statistician.  Pregnancy and menstrual history. What immunizations do I need?  Influenza (flu) vaccine  This is recommended every year. Tetanus, diphtheria, and pertussis (Tdap) vaccine  You may need a Td booster every 10 years. Varicella (chickenpox) vaccine  You may need this vaccine if you have not already been vaccinated. Zoster (shingles) vaccine  You may need this after age 33. Pneumococcal conjugate (PCV13) vaccine  One dose is recommended after age 84. Pneumococcal polysaccharide (PPSV23) vaccine  One dose is recommended after age 84. Measles, mumps, and rubella (MMR) vaccine  You may need at least one dose of MMR if you were born in 1957 or later. You may also need a second dose. Meningococcal conjugate (MenACWY) vaccine  You may need this if you have certain conditions. Hepatitis A vaccine  You may need this if you have certain conditions or if you travel or work in places where you may be exposed  to hepatitis A. Hepatitis B vaccine  You may need this if you have certain conditions or if you travel or work in places where you may be exposed to hepatitis B. Haemophilus influenzae type b (Hib) vaccine  You may need this if you have certain conditions. You may receive vaccines as individual doses or as more than one vaccine together in one shot (combination vaccines). Talk with your health care provider about the risks and benefits of combination vaccines. What tests do I need? Blood tests  Lipid and cholesterol levels. These may be checked every 5 years, or more frequently depending on your overall health.  Hepatitis C test.  Hepatitis B test. Screening  Lung cancer screening. You may have this screening every year starting at age 84 if you have a 30-pack-year history of smoking and currently smoke or have quit within the past 15 years.  Colorectal cancer screening. All adults should have this screening starting at age 84 and continuing until age 15. Your health care provider may recommend screening at age 84 if you are at increased risk. You will have tests every 1-10 years, depending on your results and the type of screening test.  Diabetes screening. This is done by checking your blood sugar (glucose) after you have not eaten for a while (fasting). You may have this done every 1-3 years.  Mammogram. This may be done every 1-2 years. Talk with your health care provider about how often you should have regular mammograms.  BRCA-related cancer screening. This may be done if you have a family history of breast, ovarian, tubal, or peritoneal cancers.  Other tests  Sexually transmitted disease (STD) testing.  Bone density scan. This is done to screen for osteoporosis. You may have this done starting at age 84. Follow these instructions at home: Eating and drinking  Eat a diet that includes fresh fruits and vegetables, whole grains, lean protein, and low-fat dairy products. Limit  your intake of foods with high amounts of sugar, saturated fats, and salt.  Take vitamin and mineral supplements as recommended by your health care provider.  Do not drink alcohol if your health care provider tells you not to drink.  If you drink alcohol: ? Limit how much you have to 0-1 drink a day. ? Be aware of how much alcohol is in your drink. In the [place] one drink equals one 12 oz bottle of beer (355 mL), one 5 oz glass of wine (148 mL), or one 1 oz glass of hard liquor (44 mL). Lifestyle  Take daily care of your teeth and gums.  Stay active. Exercise for at least 30 minutes on 5 or more days each week.  Do not use any products that contain nicotine or tobacco, such as cigarettes, e-cigarettes, and chewing tobacco. If you need help quitting, ask your health care provider.  If you are sexually active, practice safe sex. Use a condom or other form of protection in order to prevent STIs (sexually transmitted infections).  Talk with your health care provider about taking a low-dose aspirin or statin. What's next?  Go to your health care provider once a year for a well check visit.  Ask your health care provider how often you should have your eyes and teeth checked.  Stay up to date on all vaccines. This information is not intended to replace advice given to you by your health care provider. Make sure you discuss any questions you have with your health care provider. Document Revised: 10/18/2018 Document Reviewed: 10/18/2018 Elsevier Patient Education  2020 [employer].

## 2020-08-19 NOTE — Progress Notes (Signed)
New Patient Office Visit  Subjective:  Patient ID: [name], female    DOB: 1932/06/11  Age: 86 y.o. MRN: [id number]  CC:  Chief Complaint  Patient presents with  . New Patient (Initial Visit)    Patient wants to establish a new PCP in [place]    HPI [name] presents for new patient.  Patient presents for follow up of hypertension.  Patient tolerating lisinopril, diltiazem. well with no side effects.  Patient was diagnosed with hypertension 2000 so has been treated for hypertension for 20 years.Patient is working on maintaining diet and exercise regimen and follows up as directed. Complication include atrial fibrilation.  Patient presents with hyperlipidemia.  Compliance with treatment has been good; patient takes medicines as directed, maintains low cholesterol diet, follows up as directed, and maintains exercise regimen.  Patient is using atorvastatin without problems.  Prediabetes but not on any medicines.  No diet or exercise.  COPD sees Dr. [name] for 20 years she is on spiriva but not using.  She is not on other COPD medicines. He respirations are normal today.  PVD with failed angioplasty, see arteriogram.She hs peripheral neuropathy and is on gabapentin.  Chronic anticoagulation on coumadin, no bleeding. She has atrial fibrillation paroxysmally. No cardioversion attempted.  Chronic pain in lower back uses hydrocodone PRN dr. Nelva Bush.  No abuse. She has gotten epidurals and is pleased with her present care.  She uses hydrocodone only if needed.  She keeps it safe and no abuse.  Atrial fibrillation stable on coumadin, controlled ventricular response. No bleeding diathesis.  Her Protimes have been therapeutic.  They are tested by cardiology. Past Medical History:  Diagnosis Date  . Arthritis   . Back pain, chronic   . Coronary artery disease CARDIOLOGIST- DR [name]--  LOV IN EPIC  . Depression   . Hammer toe   . Hypercholesterolemia   . Hypertensive  heart disease    WITH LVH  . LVH (left ventricular hypertrophy) due to hypertensive disease   . Mitral insufficiency    CHRONIC  . Pulmonary hypertension ([place])   . Transfusion history     Past Surgical History:  Procedure Laterality Date  . ABDOMINAL AORTOGRAM W/LOWER EXTREMITY N/A 04/29/2020   Procedure: ABDOMINAL AORTOGRAM W/LOWER EXTREMITY;  Surgeon: [name], MD;  Location: [facility];  Service: Cardiovascular;  Laterality: N/A;  Lt Leg  . ABDOMINAL HYSTERECTOMY    . ANTERIOR REPAIR/ [place] PUBOVAGINAL SLING  05-03-2002  . BUNIONECTOMY WITH HAMMERTOE RECONSTRUCTION Right 03/01/2013   Procedure: RIGHT FOOT EXCISION BUNIONETTE AND FUSION OF DIP FOURTH TOE;  Surgeon: [name], MD;  Location: [facility];  Service: Orthopedics;  Laterality: Right;  . CARDIAC CATHETERIZATION  02-03-2009  DR  [name]   SINGLE-VESSEL OBSTRUCTIVE CAD, SMALL DIAGONAL BRANCH/ NORMAL LVF/ SEVERE MITRAL INSUFFICIENCY/ SEVERE PULMONARY HYPERTENSION  . CATARACT EXTRACTION W/ INTRAOCULAR LENS  IMPLANT, BILATERAL  2003  . COLONOSCOPY  07/25/2014   few mall scattered diverticula in the ectire examined colon. Small internal hemmorhoids.   Marland Kitchen HAMMER TOE SURGERY  02/28/2012   Procedure: HAMMER TOE CORRECTION;  Surgeon: [name], MD;  Location: [facility];  Service: Orthopedics;  Laterality: Right;  FUSION OF PROXIMAL INTERPHALANGEAL  RIGHT THIRD CLAW TOE  . PARS PLANA VITRECTOMY W/ REPAIR OF MACULAR HOLE  02-04-2002   LEFT EYE  . PERIPHERAL VASCULAR BALLOON ANGIOPLASTY  04/29/2020   Procedure: PERIPHERAL VASCULAR BALLOON ANGIOPLASTY;  Surgeon: [name],  MD;  Location: [facility];  Service: Cardiovascular;;  Lt. AT  . REPAIR RIGHT SECOND CLAW TOE/ VARUS MEDIAL CLOSING WEDGE OSTEOTOMY PROXIMAL PHALANX RIGHT GREAT TOE  05-05-2005  . TOTAL HIP ARTHROPLASTY     BILATERAL---  LEFT 4/98;   RIGHT  11/98  . VARUS OSTEOTOMY PROXIMAL PHALANX, LEFT GREAT  TOE/ PARING DOWN THE CALLUS , SECOND LEFT TOE  10-27-2008    Family History  Problem Relation Age of Onset  . Heart disease Mother 97  . Prostate cancer Father 23    Social History   Socioeconomic History  . Marital status: Divorced    Spouse name: Not on file  . Number of children: 3  . Years of education: College  . Highest education level: Not on file  Occupational History  . Occupation: Retired    Fish farm manager: RETIRED  Tobacco Use  . Smoking status: Never Smoker  . Smokeless tobacco: Never Used  Vaping Use  . Vaping Use: Never used  Substance and Sexual Activity  . Alcohol use: Not Currently    Comment: occasional  . Drug use: Yes    Types: Hydrocodone  . Sexual activity: Not Currently  Other Topics Concern  . Not on file  Social History Narrative   Patient lives at home alone.   Caffeine Use: 1 cup daily occasionally   Social Determinants of Health   Financial Resource Strain: Low Risk   . Difficulty of Paying Living Expenses: Not hard at all  Food Insecurity: No Food Insecurity  . Worried About Charity fundraiser in the Last Year: Never true  . Ran Out of Food in the Last Year: Never true  Transportation Needs: No Transportation Needs  . Lack of Transportation (Medical): No  . Lack of Transportation (Non-Medical): No  Physical Activity: Inactive  . Days of Exercise per Week: 0 days  . Minutes of Exercise per Session: 0 min  Stress: No Stress Concern Present  . Feeling of Stress : Not at all  Social Connections:   . Frequency of Communication with Friends and Family: Not on file  . Frequency of Social Gatherings with Friends and Family: Not on file  . Attends Religious Services: Not on file  . Active Member of Clubs or Organizations: Not on file  . Attends Archivist Meetings: Not on file  . Marital Status: Not on file  Intimate Partner Violence:   . Fear of Current or Ex-Partner: Not on file  . Emotionally Abused: Not on file  . Physically  Abused: Not on file  . Sexually Abused: Not on file    ROS Review of Systems  Constitutional: Negative for activity change and appetite change.  HENT: Negative.   Eyes: Negative.   Respiratory: Positive for shortness of breath.   Cardiovascular: Positive for leg swelling. Negative for chest pain and palpitations.  Endocrine: Positive for polyuria.  Genitourinary: Negative.   Neurological: Positive for weakness (legs).       Uses rollator  Psychiatric/Behavioral: Negative.     Objective:   Today's Vitals: BP (!) 160/70   Pulse 95   Temp 97.7 F (36.5 C)   Resp 16   Ht 5' 0.24" (1.53 m)   Wt 165 lb (74.8 kg)   BMI 31.97 kg/m   Physical Exam Vitals reviewed.  Constitutional:      Appearance: Normal appearance. She is obese.  HENT:     Head: Normocephalic.     Right Ear: Tympanic membrane, ear canal  and external ear normal.     Left Ear: Tympanic membrane, ear canal and external ear normal.     Nose: Nose normal.     Mouth/Throat:     Mouth: Mucous membranes are moist.     Pharynx: Oropharyngeal exudate present.  Eyes:     Extraocular Movements: Extraocular movements intact.     Conjunctiva/sclera: Conjunctivae normal.     Pupils: Pupils are equal, round, and reactive to light.  Cardiovascular:     Rate and Rhythm: Normal rate and regular rhythm.     Pulses: Normal pulses.     Heart sounds: Normal heart sounds.  Pulmonary:     Effort: Pulmonary effort is normal.     Breath sounds: Normal breath sounds.  Abdominal:     General: Abdomen is flat. Bowel sounds are normal.     Palpations: Abdomen is soft.  Skin:    Capillary Refill: Capillary refill takes less than 2 seconds.     Comments: Callus left foot pared down, no bleeding  Neurological:     General: No focal deficit present.     Mental Status: She is alert.     Sensory: Sensory deficit (both feet) present.     Motor: Weakness (legs) present.  Psychiatric:        Mood and Affect: Mood normal.         Thought Content: Thought content normal.     Assessment & Plan:   Problem List Items Addressed This Visit      Other   Hyperlipidemia AN INDIVIDUAL CARE PLAN for hyperlipidemia/ cholesterol was established and reinforced today.  The patient's status was assessed using clinical findings on exam, lab and other diagnostic tests. The patient's disease status was assessed based on evidence-based guidelines and found to be well controlled. MEDICATIONS were reviewed. SELF MANAGEMENT GOALS have been discussed and patient's success at attaining the goal of low cholesterol was assessed. RECOMMENDATION given include regular exercise 3 days a week and low cholesterol/low fat diet. CLINICAL SUMMARY including written plan to identify barriers unique to the patient due to social or economic  reasons was discussed.    Menopause present Patient is stable and on no horomones    Vitamin D deficiency - Primary AN INDIVIDUAL CARE PLAN for vitamin D deficiencywas established and reinforced today.  The patient's status was assessed using clinical findings on exam, labs, and other diagnostic testing. Patient's success at meeting treatment goals based on disease specific evidence-bassed guidelines and found to be in good control. RECOMMENDATIONS include maintaining present medicines and treatment.    Prediabetes Continue on diet and activity    Long-term current use of opiate analgesic AN INDIVIDUAL CARE PLAN was established and reinforced today.  The patient's status was assessed using clinical findings on exam, labs, and other diagnostic testing. Patient's success at meeting treatment goals based on disease specific evidence-bassed guidelines and found to be in fair control. RECOMMENDATIONS include maintining present medicines and treatment. He is on chronichydrocodone from neurosurgeon  with no abuse.  Negative REMS.      Outpatient Encounter Medications as of 08/19/2020  Medication Sig  . ascorbic acid  (VITAMIN C) 500 MG tablet Take 600 mg by mouth daily.   Marland Kitchen atorvastatin (LIPITOR) 80 MG tablet Take 1 tablet (80 mg total) by mouth daily.  . Biotin 1000 MCG tablet Take 1 tablet by mouth daily.   . Calcium Carbonate-Vitamin D (CALCIUM + D PO) Take by mouth daily.    . Cholecalciferol (VITAMIN  D PO) Take by mouth daily.    . Cyanocobalamin (VITAMIN B-12 PO) Take 1 tablet by mouth daily.   Marland Kitchen diltiazem (TIAZAC) 120 MG 24 hr capsule TAKE ONE CAPSULE BY MOUTH EVERY DAY  . folic acid (FOLVITE) 1 MG tablet Take 1 mg by mouth daily.  . furosemide (LASIX) 40 MG tablet TAKE 1/2 TABLET BY MOUTH DAILY  . HYDROcodone-acetaminophen (NORCO) 10-325 MG per tablet Take 1 tablet by mouth every 6 (six) hours as needed for moderate pain.   Marland Kitchen lisinopril (ZESTRIL) 20 MG tablet Take 1 tablet (20 mg total) by mouth daily.  . pantoprazole (PROTONIX) 40 MG tablet Take 40 mg by mouth daily.  . potassium chloride SA (KLOR-CON) 20 MEQ tablet TAKE ONE TABLET BY MOUTH TWICE DAILY (Patient taking differently: Take 20 mEq by mouth 2 (two) times daily. )  . sertraline (ZOLOFT) 50 MG tablet Take 75 mg by mouth daily.   . Tiotropium Bromide Monohydrate (SPIRIVA RESPIMAT) 2.5 MCG/ACT AERS Inhale 2 puffs into the lungs daily.  . valACYclovir (VALTREX) 500 MG tablet Take 500 mg by mouth 2 (two) times daily.  . vitamin A 8000 UNIT capsule Take 8,000 Units by mouth daily.    Marland Kitchen warfarin (COUMADIN) 5 MG tablet Take 0.5-1 tablets (2.5-5 mg total) by mouth See admin instructions. 5 mg on Mondays and 2.5 mg all other days  . [DISCONTINUED] fluticasone-salmeterol (ADVAIR HFA) 45-21 MCG/ACT inhaler Inhale 2 puffs into the lungs 2 (two) times daily.  Marland Kitchen gabapentin (NEURONTIN) 300 MG capsule Take 600 mg by mouth at bedtime.   . [DISCONTINUED] diltiazem (CARDIZEM) 120 MG tablet Take 1 tablet (120 mg total) by mouth daily.   No facility-administered encounter medications on file as of 08/19/2020.    Follow-up: Return in about 3 months (around  11/19/2020) for fasting.   Reinaldo Meeker, MD

## 2020-08-20 LAB — HEMOGLOBIN A1C
Est. average glucose Bld gHb Est-mCnc: 131 mg/dL
Hgb A1c MFr Bld: 6.2 % — ABNORMAL HIGH (ref 4.8–5.6)

## 2020-08-20 LAB — COMPREHENSIVE METABOLIC PANEL
ALT: 19 IU/L (ref 0–32)
AST: 28 IU/L (ref 0–40)
Albumin/Globulin Ratio: 1.5 (ref 1.2–2.2)
Albumin: 4.3 g/dL (ref 3.6–4.6)
Alkaline Phosphatase: 93 IU/L (ref 44–121)
BUN/Creatinine Ratio: 15 (ref 12–28)
BUN: 16 mg/dL (ref 8–27)
Bilirubin Total: 0.3 mg/dL (ref 0.0–1.2)
CO2: 23 mmol/L (ref 20–29)
Calcium: 9.8 mg/dL (ref 8.7–10.3)
Chloride: 107 mmol/L — ABNORMAL HIGH (ref 96–106)
Creatinine, Ser: 1.05 mg/dL — ABNORMAL HIGH (ref 0.57–1.00)
GFR calc Af Amer: 56 mL/min/{1.73_m2} — ABNORMAL LOW (ref >59)
GFR calc non Af Amer: 48 mL/min/{1.73_m2} — ABNORMAL LOW (ref >59)
Globulin, Total: 2.9 g/dL (ref 1.5–4.5)
Glucose: 114 mg/dL — ABNORMAL HIGH (ref 65–99)
Potassium: 4.5 mmol/L (ref 3.5–5.2)
Sodium: 143 mmol/L (ref 134–144)
Total Protein: 7.2 g/dL (ref 6.0–8.5)

## 2020-08-20 LAB — CBC WITH DIFFERENTIAL/PLATELET
Basophils Absolute: 0 10*3/uL (ref 0.0–0.2)
Basos: 1 %
EOS (ABSOLUTE): 0.1 10*3/uL (ref 0.0–0.4)
Eos: 3 %
Hematocrit: 39.6 % (ref 34.0–46.6)
Hemoglobin: 12.5 g/dL (ref 11.1–15.9)
Immature Grans (Abs): 0 10*3/uL (ref 0.0–0.1)
Immature Granulocytes: 0 %
Lymphocytes Absolute: 0.8 10*3/uL (ref 0.7–3.1)
Lymphs: 19 %
MCH: 28.4 pg (ref 26.6–33.0)
MCHC: 31.6 g/dL (ref 31.5–35.7)
MCV: 90 fL (ref 79–97)
Monocytes Absolute: 0.6 10*3/uL (ref 0.1–0.9)
Monocytes: 12 %
Neutrophils Absolute: 2.9 10*3/uL (ref 1.4–7.0)
Neutrophils: 65 %
Platelets: 225 10*3/uL (ref 150–450)
RBC: 4.4 x10E6/uL (ref 3.77–5.28)
RDW: 14.3 % (ref 11.7–15.4)
WBC: 4.5 10*3/uL (ref 3.4–10.8)

## 2020-08-20 LAB — LIPID PANEL
Chol/HDL Ratio: 3.3 ratio (ref 0.0–4.4)
Cholesterol, Total: 145 mg/dL (ref 100–199)
HDL: 44 mg/dL (ref >39)
LDL Chol Calc (NIH): 81 mg/dL (ref 0–99)
Triglycerides: 107 mg/dL (ref 0–149)
VLDL Cholesterol Cal: 20 mg/dL (ref 5–40)

## 2020-08-20 LAB — VITAMIN D 25 HYDROXY (VIT D DEFICIENCY, FRACTURES): Vit D, 25-Hydroxy: 49.2 ng/mL (ref 30.0–100.0)

## 2020-08-20 LAB — CARDIOVASCULAR RISK ASSESSMENT

## 2020-08-20 NOTE — Progress Notes (Signed)
CBC normal, Kidney tests stable,  glucose 114, liver tests normal, A1c 6.2, Cholesterol normal, Vitamin D 49.2 normal lp

## 2020-08-25 ENCOUNTER — Other Ambulatory Visit: Payer: Self-pay

## 2020-08-25 ENCOUNTER — Encounter: Payer: Self-pay | Admitting: Legal Medicine

## 2020-08-25 ENCOUNTER — Ambulatory Visit (INDEPENDENT_AMBULATORY_CARE_PROVIDER_SITE_OTHER): Payer: Medicare Other

## 2020-08-25 DIAGNOSIS — I482 Chronic atrial fibrillation, unspecified: Secondary | ICD-10-CM

## 2020-08-25 DIAGNOSIS — Z5181 Encounter for therapeutic drug level monitoring: Secondary | ICD-10-CM

## 2020-08-25 DIAGNOSIS — I4891 Unspecified atrial fibrillation: Secondary | ICD-10-CM

## 2020-08-25 DIAGNOSIS — Z7901 Long term (current) use of anticoagulants: Secondary | ICD-10-CM

## 2020-08-25 LAB — POCT INR: INR: 2.3 (ref 2.0–3.0)

## 2020-08-25 NOTE — Patient Instructions (Signed)
Continue taking 1/2 tablet daily except 1 tablet on Mondays.  Repeat INR in 7 weeks

## 2020-08-26 ENCOUNTER — Telehealth: Payer: Self-pay | Admitting: Cardiology

## 2020-08-26 ENCOUNTER — Encounter: Payer: Self-pay | Admitting: Nurse Practitioner

## 2020-08-26 MED ORDER — DILTIAZEM HCL ER BEADS 120 MG PO CP24
120.0000 mg | ORAL_CAPSULE | Freq: Every day | ORAL | 3 refills | Status: DC
Start: 2020-08-26 — End: 2021-11-03

## 2020-08-26 NOTE — Telephone Encounter (Signed)
Spoke to patient she stated she needs Diltiazem refilled with Dr.Jordan's name.Stated when she picked up prescription it had her old Dr.'s name on bottle.Advised I will send a new prescription to her pharmacy.

## 2020-08-26 NOTE — Telephone Encounter (Signed)
    Pt would like to speak with Cheryl. 

## 2020-09-02 ENCOUNTER — Telehealth (INDEPENDENT_AMBULATORY_CARE_PROVIDER_SITE_OTHER): Payer: Medicare Other | Admitting: Legal Medicine

## 2020-09-02 ENCOUNTER — Encounter: Payer: Self-pay | Admitting: Legal Medicine

## 2020-09-02 DIAGNOSIS — Z719 Counseling, unspecified: Secondary | ICD-10-CM | POA: Insufficient documentation

## 2020-09-02 NOTE — Progress Notes (Signed)
Virtual Visit via Telephone Note   This visit type was conducted due to national recommendations for restrictions regarding the COVID-19 Pandemic (e.g. social distancing) in an effort to limit this patient's exposure and mitigate transmission in our community.  Due to her co-morbid illnesses, this patient is at least at moderate risk for complications without adequate follow up.  This format is felt to be most appropriate for this patient at this time.  The patient did not have access to video technology/had technical difficulties with video requiring transitioning to audio format only (telephone).  All issues noted in this document were discussed and addressed.  No physical exam could be performed with this format.  Patient verbally consented to a telehealth visit.   Date:  09/02/2020   ID:  [name], DOB Jul 21, 1932, MRN [id number]  Patient Location: Home Provider Location: Office/Clinic  PCP:  [name], MD   Evaluation Performed:  Follow-Up Visit  Chief Complaint:  Follow up labs  History of Present Illness:    [name] is a 86 y.o. female with patient concerns about her labs, we discussed A1c, kidney stage 3, chloride and compared to last year's values.  The patient does not have symptoms concerning for COVID-19 infection (fever, chills, cough, or new shortness of breath).    Past Medical History:  Diagnosis Date  . Arthritis   . Back pain, chronic   . Coronary artery disease CARDIOLOGIST- DR [name]--  LOV IN EPIC  . Depression   . Hammer toe   . Hypercholesterolemia   . Hypertensive heart disease    WITH LVH  . LVH (left ventricular hypertrophy) due to hypertensive disease   . Mitral insufficiency    CHRONIC  . Pulmonary hypertension ([place])   . Transfusion history     Past Surgical History:  Procedure Laterality Date  . ABDOMINAL AORTOGRAM W/LOWER EXTREMITY N/A 04/29/2020   Procedure: ABDOMINAL AORTOGRAM W/LOWER EXTREMITY;  Surgeon:  [name], MD;  Location: [facility];  Service: Cardiovascular;  Laterality: N/A;  Lt Leg  . ABDOMINAL HYSTERECTOMY    . ANTERIOR REPAIR/ [place] PUBOVAGINAL SLING  05-03-2002  . BUNIONECTOMY WITH HAMMERTOE RECONSTRUCTION Right 03/01/2013   Procedure: RIGHT FOOT EXCISION BUNIONETTE AND FUSION OF DIP FOURTH TOE;  Surgeon: [name], MD;  Location: [facility];  Service: Orthopedics;  Laterality: Right;  . CARDIAC CATHETERIZATION  02-03-2009  DR  [name]   SINGLE-VESSEL OBSTRUCTIVE CAD, SMALL DIAGONAL BRANCH/ NORMAL LVF/ SEVERE MITRAL INSUFFICIENCY/ SEVERE PULMONARY HYPERTENSION  . CATARACT EXTRACTION W/ INTRAOCULAR LENS  IMPLANT, BILATERAL  2003  . COLONOSCOPY  07/25/2014   few mall scattered diverticula in the ectire examined colon. Small internal hemmorhoids.   Marland Kitchen HAMMER TOE SURGERY  02/28/2012   Procedure: HAMMER TOE CORRECTION;  Surgeon: [name], MD;  Location: [facility];  Service: Orthopedics;  Laterality: Right;  FUSION OF PROXIMAL INTERPHALANGEAL  RIGHT THIRD CLAW TOE  . PARS PLANA VITRECTOMY W/ REPAIR OF MACULAR HOLE  02-04-2002   LEFT EYE  . PERIPHERAL VASCULAR BALLOON ANGIOPLASTY  04/29/2020   Procedure: PERIPHERAL VASCULAR BALLOON ANGIOPLASTY;  Surgeon: [name], MD;  Location: [facility];  Service: Cardiovascular;;  Lt. AT  . REPAIR RIGHT SECOND CLAW TOE/ VARUS MEDIAL CLOSING WEDGE OSTEOTOMY PROXIMAL PHALANX RIGHT GREAT TOE  05-05-2005  . TOTAL HIP ARTHROPLASTY     BILATERAL---  LEFT 4/98;   RIGHT  11/98  . VARUS OSTEOTOMY PROXIMAL PHALANX, LEFT  GREAT TOE/ PARING DOWN THE CALLUS , SECOND LEFT TOE  10-27-2008    Family History  Problem Relation Age of Onset  . Heart disease Mother 63  . Prostate cancer Father 19    Social History   Socioeconomic History  . Marital status: Divorced    Spouse name: Not on file  . Number of children: 3  . Years of education: College  . Highest education level: Not on  file  Occupational History  . Occupation: Retired    Fish farm manager: RETIRED  Tobacco Use  . Smoking status: Never Smoker  . Smokeless tobacco: Never Used  Vaping Use  . Vaping Use: Never used  Substance and Sexual Activity  . Alcohol use: Not Currently    Comment: occasional  . Drug use: Yes    Types: Hydrocodone  . Sexual activity: Not Currently  Other Topics Concern  . Not on file  Social History Narrative   Patient lives at home alone.   Caffeine Use: 1 cup daily occasionally   Social Determinants of Health   Financial Resource Strain: Low Risk   . Difficulty of Paying Living Expenses: Not hard at all  Food Insecurity: No Food Insecurity  . Worried About Charity fundraiser in the Last Year: Never true  . Ran Out of Food in the Last Year: Never true  Transportation Needs: No Transportation Needs  . Lack of Transportation (Medical): No  . Lack of Transportation (Non-Medical): No  Physical Activity: Inactive  . Days of Exercise per Week: 0 days  . Minutes of Exercise per Session: 0 min  Stress: No Stress Concern Present  . Feeling of Stress : Not at all  Social Connections:   . Frequency of Communication with Friends and Family: Not on file  . Frequency of Social Gatherings with Friends and Family: Not on file  . Attends Religious Services: Not on file  . Active Member of Clubs or Organizations: Not on file  . Attends Archivist Meetings: Not on file  . Marital Status: Not on file  Intimate Partner Violence:   . Fear of Current or Ex-Partner: Not on file  . Emotionally Abused: Not on file  . Physically Abused: Not on file  . Sexually Abused: Not on file    Outpatient Medications Prior to Visit  Medication Sig Dispense Refill  . ascorbic acid (VITAMIN C) 500 MG tablet Take 600 mg by mouth daily.     Marland Kitchen atorvastatin (LIPITOR) 80 MG tablet Take 1 tablet (80 mg total) by mouth daily. 90 tablet 3  . Biotin 1000 MCG tablet Take 1 tablet by mouth daily.     .  Calcium Carbonate-Vitamin D (CALCIUM + D PO) Take by mouth daily.      . Cholecalciferol (VITAMIN D PO) Take by mouth daily.      . Cyanocobalamin (VITAMIN B-12 PO) Take 1 tablet by mouth daily.     Marland Kitchen diltiazem (TIAZAC) 120 MG 24 hr capsule Take 1 capsule (120 mg total) by mouth daily. 90 capsule 3  . folic acid (FOLVITE) 1 MG tablet Take 1 mg by mouth daily.    . furosemide (LASIX) 40 MG tablet TAKE 1/2 TABLET BY MOUTH DAILY 45 tablet 2  . gabapentin (NEURONTIN) 300 MG capsule Take 600 mg by mouth at bedtime.     Marland Kitchen HYDROcodone-acetaminophen (NORCO) 10-325 MG per tablet Take 1 tablet by mouth every 6 (six) hours as needed for moderate pain.     Marland Kitchen lisinopril (ZESTRIL)  20 MG tablet Take 1 tablet (20 mg total) by mouth daily. 90 tablet 0  . pantoprazole (PROTONIX) 40 MG tablet Take 40 mg by mouth daily.    . potassium chloride SA (KLOR-CON) 20 MEQ tablet TAKE ONE TABLET BY MOUTH TWICE DAILY (Patient taking differently: Take 20 mEq by mouth 2 (two) times daily. ) 60 tablet 10  . sertraline (ZOLOFT) 50 MG tablet Take 75 mg by mouth daily.     . Tiotropium Bromide Monohydrate (SPIRIVA RESPIMAT) 2.5 MCG/ACT AERS Inhale 2 puffs into the lungs daily. 1 g 6  . valACYclovir (VALTREX) 500 MG tablet Take 500 mg by mouth 2 (two) times daily.    . vitamin A 8000 UNIT capsule Take 8,000 Units by mouth daily.      Marland Kitchen warfarin (COUMADIN) 5 MG tablet Take 0.5-1 tablets (2.5-5 mg total) by mouth See admin instructions. 5 mg on Mondays and 2.5 mg all other days 90 tablet 0   No facility-administered medications prior to visit.    Allergies:   Sulfanilamide and Penicillins   Social History   Tobacco Use  . Smoking status: Never Smoker  . Smokeless tobacco: Never Used  Vaping Use  . Vaping Use: Never used  Substance Use Topics  . Alcohol use: Not Currently    Comment: occasional  . Drug use: Yes    Types: Hydrocodone     Review of Systems  Constitutional: Negative.   HENT: Negative.   Respiratory:  Negative for hemoptysis and sputum production.   Cardiovascular: Negative for orthopnea and claudication.  Genitourinary: Negative.   Musculoskeletal: Negative.   Skin: Negative.   Neurological: Negative.   Psychiatric/Behavioral: Negative.      Labs/Other Tests and Data Reviewed:    Recent Labs: 03/26/2020: TSH 2.340 05/19/2020: NT-Pro BNP 665 08/19/2020: ALT 19; BUN 16; Creatinine, Ser 1.05; Hemoglobin 12.5; Platelets 225; Potassium 4.5; Sodium 143   Recent Lipid Panel Lab Results  Component Value Date/Time   CHOL 145 08/19/2020 10:26 AM   TRIG 107 08/19/2020 10:26 AM   HDL 44 08/19/2020 10:26 AM   CHOLHDL 3.3 08/19/2020 10:26 AM   CHOLHDL 2 01/26/2012 09:24 AM   LDLCALC 81 08/19/2020 10:26 AM    Wt Readings from Last 3 Encounters:  09/02/20 165 lb (74.8 kg)  08/19/20 165 lb (74.8 kg)  05/19/20 165 lb (74.8 kg)     Objective:    Vital Signs:  Temp (!) 96 F (35.6 C)   Ht 5' (1.524 m)   Wt 165 lb (74.8 kg)   BMI 32.22 kg/m    Physical Exam reviewed  ASSESSMENT & PLAN:   1. Encounter for consultation   We discussed all labs      COVID-19 Education: The signs and symptoms of COVID-19 were discussed with the patient and how to seek care for testing (follow up with PCP or arrange E-visit). The importance of social distancing was discussed today.  Time:   Today, I have spent 20 minutes with the patient with telehealth technology discussing the above problems.    Follow Up:  Virtual Visit  prn  Signed, Reinaldo Meeker, MD  09/02/2020 8:52 AM    [place]

## 2020-09-15 ENCOUNTER — Ambulatory Visit: Payer: Medicare Other | Admitting: Nurse Practitioner

## 2020-09-17 DIAGNOSIS — Z23 Encounter for immunization: Secondary | ICD-10-CM | POA: Diagnosis not present

## 2020-09-21 NOTE — Progress Notes (Signed)
[name] Date of Birth: Feb 28, 1932   History of Present Illness: [name] is seen for follow up Afib. She has a history of chronic atrial fibrillation, hypertensive heart disease, and pulmonary hypertension. She has a history of chronic back problems. She has moderate MR and COPD. In October 2019 she developed progressive weakness in her legs. We held her lipitor without improvement. MRI was done. This showed some chronic fatty atrophy of the muscles c/w denervation in the peroneal nerve distribution. We had discussed switching to a DOAC but she could not afford it. INR is followed in [place].  She is followed by podiatry for evaluation of a left foot ulcer. LE arterial dopplers done as noted below. She states she has noted this ulcer for 2 months. She is walking with difficulty with a walker. She has chronic SOB. Notes occasional congestion. No chest pain. No edema.  She was seen by [name] and underwent angiography in June. This  showed no significant aortoiliac disease.  There was mild to moderate left SFA and popliteal disease with one-vessel runoff below the knee via the peroneal artery which was free of significant disease.  There was mild disease in the TP trunk.  The anterior tibial and posterior tibial arteries were both occluded with reconstitution distally via the communicating branches of the peroneal artery with intact pedal arch.  He attempted revascularization of the anterior tibial artery to give her more blood flow but was not able to cross the occlusion antegrade given the length of the occlusion. Medical management recommended.   On follow up today she reports her ulcer is unchanged. Doesn't bother her. Denies any chest pain. Breathing is stable. Is more concerned about her finger nails than anything.     Current Outpatient Medications on File Prior to Visit  Medication Sig Dispense Refill  . ascorbic acid (VITAMIN C) 500 MG tablet Take 600 mg by mouth daily.      Marland Kitchen atorvastatin (LIPITOR) 80 MG tablet Take 1 tablet (80 mg total) by mouth daily. 90 tablet 3  . Biotin 1000 MCG tablet Take 1 tablet by mouth daily.     . Calcium Carbonate-Vitamin D (CALCIUM + D PO) Take by mouth daily.      . Cholecalciferol (VITAMIN D PO) Take by mouth daily.      . Cyanocobalamin (VITAMIN B-12 PO) Take 1 tablet by mouth daily.     Marland Kitchen diltiazem (TIAZAC) 120 MG 24 hr capsule Take 1 capsule (120 mg total) by mouth daily. 90 capsule 3  . folic acid (FOLVITE) 1 MG tablet Take 1 mg by mouth daily.    . furosemide (LASIX) 40 MG tablet TAKE 1/2 TABLET BY MOUTH DAILY 45 tablet 2  . gabapentin (NEURONTIN) 300 MG capsule Take 600 mg by mouth at bedtime.     Marland Kitchen HYDROcodone-acetaminophen (NORCO) 10-325 MG per tablet Take 1 tablet by mouth every 6 (six) hours as needed for moderate pain.     Marland Kitchen lisinopril (ZESTRIL) 20 MG tablet Take 1 tablet (20 mg total) by mouth daily. 90 tablet 0  . pantoprazole (PROTONIX) 40 MG tablet Take 40 mg by mouth daily.    . potassium chloride SA (KLOR-CON) 20 MEQ tablet TAKE ONE TABLET BY MOUTH TWICE DAILY (Patient taking differently: Take 20 mEq by mouth 2 (two) times daily. ) 60 tablet 10  . sertraline (ZOLOFT) 50 MG tablet Take 75 mg by mouth daily.     . Tiotropium Bromide Monohydrate (SPIRIVA RESPIMAT) 2.5 MCG/ACT  AERS Inhale 2 puffs into the lungs daily. 1 g 6  . valACYclovir (VALTREX) 500 MG tablet Take 500 mg by mouth 2 (two) times daily.    . vitamin A 8000 UNIT capsule Take 8,000 Units by mouth daily.      Marland Kitchen warfarin (COUMADIN) 5 MG tablet Take 0.5-1 tablets (2.5-5 mg total) by mouth See admin instructions. 5 mg on Mondays and 2.5 mg all other days 90 tablet 0   No current facility-administered medications on file prior to visit.    Allergies  Allergen Reactions  . Sulfanilamide     unkn  . Penicillins Rash    Past Medical History:  Diagnosis Date  . Arthritis   . Back pain, chronic   . Coronary artery disease CARDIOLOGIST- DR  [name]--  LOV IN EPIC  . Depression   . Hammer toe   . Hypercholesterolemia   . Hypertensive heart disease    WITH LVH  . LVH (left ventricular hypertrophy) due to hypertensive disease   . Mitral insufficiency    CHRONIC  . Pulmonary hypertension ([place])   . Transfusion history     Past Surgical History:  Procedure Laterality Date  . ABDOMINAL AORTOGRAM W/LOWER EXTREMITY N/A 04/29/2020   Procedure: ABDOMINAL AORTOGRAM W/LOWER EXTREMITY;  Surgeon: [name], MD;  Location: [facility];  Service: Cardiovascular;  Laterality: N/A;  Lt Leg  . ABDOMINAL HYSTERECTOMY    . ANTERIOR REPAIR/ [place] PUBOVAGINAL SLING  05-03-2002  . BUNIONECTOMY WITH HAMMERTOE RECONSTRUCTION Right 03/01/2013   Procedure: RIGHT FOOT EXCISION BUNIONETTE AND FUSION OF DIP FOURTH TOE;  Surgeon: [name], MD;  Location: [facility];  Service: Orthopedics;  Laterality: Right;  . CARDIAC CATHETERIZATION  02-03-2009  DR  [name]   SINGLE-VESSEL OBSTRUCTIVE CAD, SMALL DIAGONAL BRANCH/ NORMAL LVF/ SEVERE MITRAL INSUFFICIENCY/ SEVERE PULMONARY HYPERTENSION  . CATARACT EXTRACTION W/ INTRAOCULAR LENS  IMPLANT, BILATERAL  2003  . COLONOSCOPY  07/25/2014   few mall scattered diverticula in the ectire examined colon. Small internal hemmorhoids.   Marland Kitchen HAMMER TOE SURGERY  02/28/2012   Procedure: HAMMER TOE CORRECTION;  Surgeon: [name], MD;  Location[facility];  Service: Orthopedics;  Laterality: Right;  FUSION OF PROXIMAL INTERPHALANGEAL  RIGHT THIRD CLAW TOE  . PARS PLANA VITRECTOMY W/ REPAIR OF MACULAR HOLE  02-04-2002   LEFT EYE  . PERIPHERAL VASCULAR BALLOON ANGIOPLASTY  04/29/2020   Procedure: PERIPHERAL VASCULAR BALLOON ANGIOPLASTY;  Surgeon: [name], MD;  Location: [facility];  Service: Cardiovascular;;  Lt. AT  . REPAIR RIGHT SECOND CLAW TOE/ VARUS MEDIAL CLOSING WEDGE OSTEOTOMY PROXIMAL PHALANX RIGHT GREAT TOE  05-05-2005  . TOTAL HIP  ARTHROPLASTY     BILATERAL---  LEFT 4/98;   RIGHT  11/98  . VARUS OSTEOTOMY PROXIMAL PHALANX, LEFT GREAT TOE/ PARING DOWN THE CALLUS , SECOND LEFT TOE  10-27-2008    Social History   Tobacco Use  Smoking Status Never Smoker  Smokeless Tobacco Never Used    Social History   Substance and Sexual Activity  Alcohol Use Not Currently   Comment: occasional    Family History  Problem Relation Age of Onset  . Heart disease Mother 69  . Prostate cancer Father 31    Review of Systems:  As noted in history of present illness.  All other systems were reviewed and are negative.  Physical Exam: BP (!) 152/82   Pulse 91   Ht 5' (1.524 m)  Wt 165 lb 6.4 oz (75 kg)   SpO2 97%   BMI 32.30 kg/m  GENERAL:  Well appearing WF in NAD HEENT:  PERRL, EOMI, sclera are clear. Oropharynx is clear. NECK:  No jugular venous distention, carotid upstroke brisk and symmetric, no bruits, no thyromegaly or adenopathy LUNGS:  Clear to auscultation bilaterally CHEST:  Unremarkable HEART:  RRR,  PMI not displaced or sustained,S1 and S2 within normal limits, no S3, no S4: no clicks, no rubs, gr 2/6 systolic murmur apex.  ABD:  Soft, nontender. BS +, no masses or bruits. No hepatomegaly, no splenomegaly EXT: poor pedal pulses, no edema, venous varicosities. There a a calloused area over the lateral metatarsal joint on the left with ulceration. No redness or drainage.  SKIN:  Warm and dry.  No rashes NEURO:  Alert and oriented x 3. Cranial nerves II through XII intact. PSYCH:  Cognitively intact     LABORATORY DATA:  Lab Results  Component Value Date   WBC 4.5 08/19/2020   HGB 12.5 08/19/2020   HCT 39.6 08/19/2020   PLT 225 08/19/2020   GLUCOSE 114 (H) 08/19/2020   CHOL 145 08/19/2020   TRIG 107 08/19/2020   HDL 44 08/19/2020   LDLCALC 81 08/19/2020   ALT 19 08/19/2020   AST 28 08/19/2020   NA 143 08/19/2020   K 4.5 08/19/2020   CL 107 (H) 08/19/2020   CREATININE 1.05 (H) 08/19/2020    BUN 16 08/19/2020   CO2 23 08/19/2020   TSH 2.340 03/26/2020   INR 2.3 08/25/2020   HGBA1C 6.2 (H) 08/19/2020   MICROALBUR 30 05/13/2020    Labs reviewed from 02/24/17: cholesterol 174, triglycerides 110, LDL 90, HDL 62. A1c 6.0%. CMET and TSH normal. Dated 08/24/17: cholesterol 157, triglycerides 106, HDL 55, LDL 90. A1c 6.1%. CMET and CBC normal   LE arterial dopplers 03/06/20:  Summary:  Left: Moderate progression is noted compared to previous study.   Duplex imaging showed heterogenous plaque in the CFA, DFA, SFA, popliteal  artery and TPT. Medial calcifications noted in the tibial vessels.  50-74% stenosis in the TPT, based on velocity ratio.  Probable one vessel run-off via peroneal artery.  Total occlusion noted in the PTA.  Occluded proximal to mid ATA.   LE ABIs 03/06/20: Right ABIs appear increased compared to prior study on 02/21/2013. Left  ABIs appear essentially unchanged compared to prior study on 02/21/2013.  Left ABIs are unreliable; arterial wall calcification precludes accurate  ankle pressure and ABIs. Right TBIs  are essentially unchanged; left TBIs have decreased.  Summary:  Right: Resting right ankle-brachial index indicates noncompressible right  lower extremity arteries. The right toe-brachial index is normal.   Left: The left toe-brachial index is abnormal. ABIs are unreliable.  Arterial wall calcification precludes accurate ankle pressure and ABIs.     Assessment / Plan: 1. Atrial fibrillation, permanent. Rate is well controlled on diltiazem. She is anticoagulated on Coumadin. INR has been therapeutic.  2. Mild to Moderate MR. Last Echo 8/16. Asymptomatic. Exam is unchanged.   3. Hypertension with hypertensive heart disease, BP is controlled.  4. Hyperlipidemia on statin therapy. Last LDL 81 improved.  On lipitor at 80 mg daily.  Encourage healthy eating.   5. Pulmonary hypertension-moderate  6. Coronary disease. Cardiac catheterization in 2010  showed severe stenosis in a small diagonal branch. She otherwise had no significant disease. She is asymptomatic.   7. Chronic bronchitis and COPD. Chronic  dyspnea. On Spiriva.   8.  PAD - has single vessel run off below the knee. Has nonhealing ulcer on the left. Unable to cross occlusion percutaneously. Continue medical management.  Follow up in 6 months.

## 2020-09-24 ENCOUNTER — Encounter: Payer: Self-pay | Admitting: Cardiology

## 2020-09-24 ENCOUNTER — Ambulatory Visit (INDEPENDENT_AMBULATORY_CARE_PROVIDER_SITE_OTHER): Payer: Medicare Other | Admitting: Cardiology

## 2020-09-24 VITALS — BP 152/82 | HR 91 | Ht 60.0 in | Wt 165.4 lb

## 2020-09-24 DIAGNOSIS — I739 Peripheral vascular disease, unspecified: Secondary | ICD-10-CM

## 2020-09-24 DIAGNOSIS — R0602 Shortness of breath: Secondary | ICD-10-CM

## 2020-09-24 DIAGNOSIS — I482 Chronic atrial fibrillation, unspecified: Secondary | ICD-10-CM

## 2020-09-28 ENCOUNTER — Telehealth: Payer: Self-pay | Admitting: Cardiology

## 2020-09-28 NOTE — Telephone Encounter (Signed)
Spoke to patient she stated she had lipid and hepatic panels done 08/19/20 with PCP Dr.Perry.Stated she had a lab slip to repeat labs again.Advised she did not have to repeat lab at this time.

## 2020-09-28 NOTE — Telephone Encounter (Signed)
Patient calling to speak with Malachy Mood.

## 2020-09-28 NOTE — Telephone Encounter (Signed)
Called to speak with patient. Patient adamant she speak with Malachy Mood about [facility] paperwork she received. Will route to Elly Modena to follow up.

## 2020-09-29 DIAGNOSIS — G894 Chronic pain syndrome: Secondary | ICD-10-CM | POA: Diagnosis not present

## 2020-09-29 DIAGNOSIS — Z79891 Long term (current) use of opiate analgesic: Secondary | ICD-10-CM | POA: Diagnosis not present

## 2020-10-13 ENCOUNTER — Ambulatory Visit (INDEPENDENT_AMBULATORY_CARE_PROVIDER_SITE_OTHER): Payer: Medicare Other

## 2020-10-13 ENCOUNTER — Other Ambulatory Visit: Payer: Self-pay

## 2020-10-13 DIAGNOSIS — Z5181 Encounter for therapeutic drug level monitoring: Secondary | ICD-10-CM | POA: Diagnosis not present

## 2020-10-13 DIAGNOSIS — Z7901 Long term (current) use of anticoagulants: Secondary | ICD-10-CM | POA: Diagnosis not present

## 2020-10-13 DIAGNOSIS — I482 Chronic atrial fibrillation, unspecified: Secondary | ICD-10-CM | POA: Diagnosis not present

## 2020-10-13 DIAGNOSIS — I4891 Unspecified atrial fibrillation: Secondary | ICD-10-CM | POA: Diagnosis not present

## 2020-10-13 LAB — POCT INR: INR: 1.7 — AB (ref 2.0–3.0)

## 2020-10-13 NOTE — Patient Instructions (Signed)
Take 1.5 tablets today only and then Continue taking 1/2 tablet daily except 1 tablet on Mondays.  Repeat INR in 7 weeks

## 2020-10-15 ENCOUNTER — Other Ambulatory Visit: Payer: Self-pay | Admitting: Cardiology

## 2020-10-19 ENCOUNTER — Encounter: Payer: Self-pay | Admitting: Nurse Practitioner

## 2020-11-05 ENCOUNTER — Telehealth: Payer: Self-pay | Admitting: Cardiology

## 2020-11-05 NOTE — Telephone Encounter (Signed)
Spoke with pt, aware cheryl is not here, questions regarding pneumonia shot answered.

## 2020-11-05 NOTE — Telephone Encounter (Signed)
Patient called to talk with Dr. Elvis Coil nurse. Please call back

## 2020-11-09 NOTE — Telephone Encounter (Signed)
Patient calling back to speak with Cheryl. 

## 2020-11-09 NOTE — Telephone Encounter (Signed)
Spoke to patient stated she wanted to know if she needed to take a second pneumonia vaccine.Advised to call PCP.

## 2020-11-12 ENCOUNTER — Other Ambulatory Visit: Payer: Self-pay

## 2020-11-12 MED ORDER — LISINOPRIL 20 MG PO TABS: 20.0000 mg | ORAL_TABLET | Freq: Every day | ORAL | 0 refills | Status: DC

## 2020-12-01 ENCOUNTER — Ambulatory Visit (INDEPENDENT_AMBULATORY_CARE_PROVIDER_SITE_OTHER): Payer: Medicare Other

## 2020-12-01 ENCOUNTER — Other Ambulatory Visit: Payer: Self-pay

## 2020-12-01 DIAGNOSIS — I4891 Unspecified atrial fibrillation: Secondary | ICD-10-CM

## 2020-12-01 DIAGNOSIS — Z7901 Long term (current) use of anticoagulants: Secondary | ICD-10-CM | POA: Diagnosis not present

## 2020-12-01 DIAGNOSIS — I482 Chronic atrial fibrillation, unspecified: Secondary | ICD-10-CM | POA: Diagnosis not present

## 2020-12-01 DIAGNOSIS — Z5181 Encounter for therapeutic drug level monitoring: Secondary | ICD-10-CM | POA: Diagnosis not present

## 2020-12-01 LAB — POCT INR: INR: 1.9 — AB (ref 2.0–3.0)

## 2020-12-01 NOTE — Patient Instructions (Signed)
Take 1.5 tablets today only and then Continue taking 1/2 tablet daily except 1 tablet on Mondays.  Repeat INR in 7 weeks  

## 2020-12-08 ENCOUNTER — Ambulatory Visit (INDEPENDENT_AMBULATORY_CARE_PROVIDER_SITE_OTHER): Payer: Medicare Other | Admitting: Legal Medicine

## 2020-12-08 ENCOUNTER — Encounter: Payer: Self-pay | Admitting: Legal Medicine

## 2020-12-08 ENCOUNTER — Other Ambulatory Visit: Payer: Self-pay

## 2020-12-08 VITALS — BP 134/80 | HR 103 | Temp 97.0°F | Resp 18 | Ht 60.0 in | Wt 169.0 lb

## 2020-12-08 DIAGNOSIS — L97521 Non-pressure chronic ulcer of other part of left foot limited to breakdown of skin: Secondary | ICD-10-CM | POA: Diagnosis not present

## 2020-12-08 DIAGNOSIS — R7303 Prediabetes: Secondary | ICD-10-CM | POA: Diagnosis not present

## 2020-12-08 DIAGNOSIS — M8588 Other specified disorders of bone density and structure, other site: Secondary | ICD-10-CM | POA: Diagnosis not present

## 2020-12-08 DIAGNOSIS — J449 Chronic obstructive pulmonary disease, unspecified: Secondary | ICD-10-CM

## 2020-12-08 DIAGNOSIS — E559 Vitamin D deficiency, unspecified: Secondary | ICD-10-CM

## 2020-12-08 DIAGNOSIS — Z7901 Long term (current) use of anticoagulants: Secondary | ICD-10-CM

## 2020-12-08 DIAGNOSIS — I10 Essential (primary) hypertension: Secondary | ICD-10-CM | POA: Diagnosis not present

## 2020-12-08 DIAGNOSIS — L89892 Pressure ulcer of other site, stage 2: Secondary | ICD-10-CM | POA: Insufficient documentation

## 2020-12-08 DIAGNOSIS — E785 Hyperlipidemia, unspecified: Secondary | ICD-10-CM | POA: Diagnosis not present

## 2020-12-08 DIAGNOSIS — I4811 Longstanding persistent atrial fibrillation: Secondary | ICD-10-CM

## 2020-12-08 DIAGNOSIS — I739 Peripheral vascular disease, unspecified: Secondary | ICD-10-CM | POA: Diagnosis not present

## 2020-12-08 DIAGNOSIS — Z79891 Long term (current) use of opiate analgesic: Secondary | ICD-10-CM

## 2020-12-08 NOTE — Progress Notes (Signed)
Subjective:  Patient ID: [name], female    DOB: 03-16-1932  Age: 87 y.o. MRN: [id number]  Chief Complaint  Patient presents with  . Hyperlipidemia  . Hypertension    HPI: chronic visit  Patient presents for follow up of hypertension.  Patient tolerating lisinopril,  well with side effects.  Patient was diagnosed with hypertension 2010 so has been treated for hypertension for 10 years.Patient is working on maintaining diet and exercise regimen and follows up as directed. Complication include none  Patient has a diagnosis of permanent atrial fibrillation.   Patient is on warfarin and has contrled ventricular response.  Patient is CV stable..   Patient presents with hyperlipidemia.  Compliance with treatment has been good; patient takes medicines as directed, maintains low cholesterol diet, follows up as directed, and maintains exercise regimen.  Patient is using atorvastatin without problems. Current Outpatient Medications on File Prior to Visit  Medication Sig Dispense Refill  . ascorbic acid (VITAMIN C) 500 MG tablet Take 600 mg by mouth daily.     . Biotin 1000 MCG tablet Take 1 tablet by mouth daily.     . Calcium Carbonate-Vitamin D (CALCIUM + D PO) Take by mouth daily.    . Cholecalciferol (VITAMIN D PO) Take by mouth daily.    . Cyanocobalamin (VITAMIN B-12 PO) Take 1 tablet by mouth daily.     Marland Kitchen diltiazem (TIAZAC) 120 MG 24 hr capsule Take 1 capsule (120 mg total) by mouth daily. 90 capsule 3  . folic acid (FOLVITE) 1 MG tablet Take 1 mg by mouth daily.    . furosemide (LASIX) 40 MG tablet TAKE 1/2 TABLET BY MOUTH DAILY 45 tablet 2  . gabapentin (NEURONTIN) 300 MG capsule Take 600 mg by mouth at bedtime.     Marland Kitchen HYDROcodone-acetaminophen (NORCO) 10-325 MG per tablet Take 1 tablet by mouth every 6 (six) hours as needed for moderate pain.     Marland Kitchen lisinopril (ZESTRIL) 20 MG tablet Take 1 tablet (20 mg total) by mouth daily. 90 tablet 0  . pantoprazole (PROTONIX) 40 MG tablet  Take 40 mg by mouth daily.    . potassium chloride SA (KLOR-CON) 20 MEQ tablet TAKE ONE TABLET BY MOUTH TWICE DAILY 180 tablet 1  . sertraline (ZOLOFT) 50 MG tablet Take 75 mg by mouth daily.     . Tiotropium Bromide Monohydrate (SPIRIVA RESPIMAT) 2.5 MCG/ACT AERS Inhale 2 puffs into the lungs daily. 1 g 6  . valACYclovir (VALTREX) 500 MG tablet Take 500 mg by mouth 2 (two) times daily.    . vitamin A 8000 UNIT capsule Take 8,000 Units by mouth daily.    Marland Kitchen warfarin (COUMADIN) 5 MG tablet Take 0.5-1 tablets (2.5-5 mg total) by mouth See admin instructions. 5 mg on Mondays and 2.5 mg all other days 48 tablet 0  . atorvastatin (LIPITOR) 80 MG tablet Take 1 tablet (80 mg total) by mouth daily. 90 tablet 3   No current facility-administered medications on file prior to visit.   Past Medical History:  Diagnosis Date  . Arthritis   . Back pain, chronic   . Coronary artery disease CARDIOLOGIST- DR [name]--  LOV IN EPIC  . Depression   . Hammer toe   . Hypercholesterolemia   . Hypertensive heart disease    WITH LVH  . LVH (left ventricular hypertrophy) due to hypertensive disease   . Mitral insufficiency    CHRONIC  . Pulmonary hypertension ([place])   . Transfusion history  Past Surgical History:  Procedure Laterality Date  . ABDOMINAL AORTOGRAM W/LOWER EXTREMITY N/A 04/29/2020   Procedure: ABDOMINAL AORTOGRAM W/LOWER EXTREMITY;  Surgeon: [name], MD;  Location: [facility];  Service: Cardiovascular;  Laterality: N/A;  Lt Leg  . ABDOMINAL HYSTERECTOMY    . ANTERIOR REPAIR/ [place] PUBOVAGINAL SLING  05-03-2002  . BUNIONECTOMY WITH HAMMERTOE RECONSTRUCTION Right 03/01/2013   Procedure: RIGHT FOOT EXCISION BUNIONETTE AND FUSION OF DIP FOURTH TOE;  Surgeon: [name], MD;  Location: [facility];  Service: Orthopedics;  Laterality: Right;  . CARDIAC CATHETERIZATION  02-03-2009  DR  [name]   SINGLE-VESSEL OBSTRUCTIVE CAD, SMALL DIAGONAL BRANCH/ NORMAL LVF/  SEVERE MITRAL INSUFFICIENCY/ SEVERE PULMONARY HYPERTENSION  . CATARACT EXTRACTION W/ INTRAOCULAR LENS  IMPLANT, BILATERAL  2003  . COLONOSCOPY  07/25/2014   few mall scattered diverticula in the ectire examined colon. Small internal hemmorhoids.   Marland Kitchen HAMMER TOE SURGERY  02/28/2012   Procedure: HAMMER TOE CORRECTION;  Surgeon: [name], MD;  Location: [facility];  Service: Orthopedics;  Laterality: Right;  FUSION OF PROXIMAL INTERPHALANGEAL  RIGHT THIRD CLAW TOE  . PARS PLANA VITRECTOMY W/ REPAIR OF MACULAR HOLE  02-04-2002   LEFT EYE  . PERIPHERAL VASCULAR BALLOON ANGIOPLASTY  04/29/2020   Procedure: PERIPHERAL VASCULAR BALLOON ANGIOPLASTY;  Surgeon: [name], MD;  Location: [place] [facility];  Service: Cardiovascular;;  Lt. AT  . REPAIR RIGHT SECOND CLAW TOE/ VARUS MEDIAL CLOSING WEDGE OSTEOTOMY PROXIMAL PHALANX RIGHT GREAT TOE  05-05-2005  . TOTAL HIP ARTHROPLASTY     BILATERAL---  LEFT 4/98;   RIGHT  11/98  . VARUS OSTEOTOMY PROXIMAL PHALANX, LEFT GREAT TOE/ PARING DOWN THE CALLUS , SECOND LEFT TOE  10-27-2008    Family History  Problem Relation Age of Onset  . Heart disease Mother 17  . Prostate cancer Father 37   Social History   Socioeconomic History  . Marital status: Divorced    Spouse name: Not on file  . Number of children: 3  . Years of education: College  . Highest education level: Not on file  Occupational History  . Occupation: Retired    Fish farm manager: RETIRED  Tobacco Use  . Smoking status: Never Smoker  . Smokeless tobacco: Never Used  Vaping Use  . Vaping Use: Never used  Substance and Sexual Activity  . Alcohol use: Not Currently    Comment: occasional  . Drug use: Yes    Types: Hydrocodone  . Sexual activity: Not Currently  Other Topics Concern  . Not on file  Social History Narrative   Patient lives at home alone.   Caffeine Use: 1 cup daily occasionally   Social Determinants of Health   Financial Resource Strain: Low  Risk   . Difficulty of Paying Living Expenses: Not hard at all  Food Insecurity: No Food Insecurity  . Worried About Charity fundraiser in the Last Year: Never true  . Ran Out of Food in the Last Year: Never true  Transportation Needs: No Transportation Needs  . Lack of Transportation (Medical): No  . Lack of Transportation (Non-Medical): No  Physical Activity: Inactive  . Days of Exercise per Week: 0 days  . Minutes of Exercise per Session: 0 min  Stress: No Stress Concern Present  . Feeling of Stress : Not at all  Social Connections: Not on file    Review of Systems  Constitutional: Negative.  Negative for activity change, diaphoresis and fatigue.  HENT: Negative for congestion and sinus pain.   Eyes: Negative for visual disturbance.  Respiratory: Negative for chest tightness and shortness of breath.   Cardiovascular: Negative for chest pain, palpitations and leg swelling.  Gastrointestinal: Negative for abdominal distention and abdominal pain.  Endocrine: Negative for polyuria.  Genitourinary: Negative for difficulty urinating, dyspareunia and urgency.  Musculoskeletal: Negative for arthralgias and back pain.  Skin: Negative.   Neurological: Negative.   Psychiatric/Behavioral: Negative.      Objective:  BP 134/80 (BP Location: Right Arm, Patient Position: Sitting, Cuff Size: Normal)   Pulse (!) 103   Temp (!) 97 F (36.1 C) (Temporal)   Resp 18   Ht 5' (1.524 m)   Wt 169 lb (76.7 kg)   SpO2 97%   BMI 33.01 kg/m   BP/Weight 12/08/2020 09/24/2020 [id number]  Systolic BP Q000111Q 0000000 -  Diastolic BP 80 82 -  Wt. (Lbs) 169 165.4 165  BMI 33.01 32.3 32.22    Physical Exam Vitals reviewed.  Constitutional:      Appearance: Normal appearance. She is normal weight.  HENT:     Right Ear: Tympanic membrane normal.     Left Ear: Tympanic membrane normal.     Nose: Nose normal.     Mouth/Throat:     Mouth: Mucous membranes are moist.     Pharynx: Oropharynx is clear.   Eyes:     Extraocular Movements: Extraocular movements intact.     Conjunctiva/sclera: Conjunctivae normal.     Pupils: Pupils are equal, round, and reactive to light.  Cardiovascular:     Rate and Rhythm: Normal rate. Rhythm irregular.     Pulses: Normal pulses.     Heart sounds: Normal heart sounds.  Pulmonary:     Effort: Pulmonary effort is normal. No respiratory distress.     Breath sounds: Normal breath sounds. No rales.  Abdominal:     General: Abdomen is flat. Bowel sounds are normal. There is no distension.     Tenderness: There is no abdominal tenderness.  Musculoskeletal:        General: Normal range of motion.     Cervical back: Normal range of motion.  Skin:    General: Skin is warm.     Findings: Bruising present.  Neurological:     General: No focal deficit present.     Mental Status: She is alert and oriented to person, place, and time. Mental status is at baseline.     Coordination: Coordination abnormal.       Lab Results  Component Value Date   WBC 4.5 08/19/2020   HGB 12.5 08/19/2020   HCT 39.6 08/19/2020   PLT 225 08/19/2020   GLUCOSE 114 (H) 08/19/2020   CHOL 145 08/19/2020   TRIG 107 08/19/2020   HDL 44 08/19/2020   LDLCALC 81 08/19/2020   ALT 19 08/19/2020   AST 28 08/19/2020   NA 143 08/19/2020   K 4.5 08/19/2020   CL 107 (H) 08/19/2020   CREATININE 1.05 (H) 08/19/2020   BUN 16 08/19/2020   CO2 23 08/19/2020   TSH 2.340 03/26/2020   INR 1.9 (A) 12/01/2020   HGBA1C 6.2 (H) 08/19/2020   MICROALBUR 30 05/13/2020      Assessment & Plan:   Diagnoses and all orders for this visit: Vitamin D deficiency -     Vitamin D, 25-hydroxy AN INDIVIDUAL CARE PLAN for vitamin D was established and reinforced today.  The patient's status was assessed using clinical findings on  exam, labs, and other diagnostic testing. Patient's success at meeting treatment goals based on disease specific evidence-bassed guidelines and found to be in good  control. RECOMMENDATIONS include maintaining present medicines and treatment.  Prediabetes -     Hemoglobin A1c -     AMB Referral to [place] Patient is on diet for prediabetes  Long-term current use of opiate analgesic -     AMB Referral to [place] was established and reinforced today.  The patient's status was assessed using clinical findings on exam, labs, and other diagnostic testing. Patient's success at meeting treatment goals based on disease specific evidence-bassed guidelines and found to be in fair control. RECOMMENDATIONS include maintining present medicines and treatment. He is on chronichydrocodone with no abuse.  Negative REMS.  Hyperlipidemia, unspecified hyperlipidemia type -     Lipid panel AN INDIVIDUAL CARE PLAN for hyperlipidemia/ cholesterol was established and reinforced today.  The patient's status was assessed using clinical findings on exam, lab and other diagnostic tests. The patient's disease status was assessed based on evidence-based guidelines and found to be well controlled. MEDICATIONS were reviewed. SELF MANAGEMENT GOALS have been discussed and patient's success at attaining the goal of low cholesterol was assessed. RECOMMENDATION given include regular exercise 3 days a week and low cholesterol/low fat diet. CLINICAL SUMMARY including written plan to identify barriers unique to the patient due to social or economic  reasons was discussed.  Essential hypertension -     CBC with Differential/Platelet -     Comprehensive metabolic panel An individual hypertension care plan was established and reinforced today.  The patient's status was assessed using clinical findings on exam and labs or diagnostic tests. The patient's success at meeting treatment goals on disease specific evidence-based guidelines and found to be well controlled. SELF MANAGEMENT: The patient and I together assessed ways to personally  work towards obtaining the recommended goals. RECOMMENDATIONS: avoid decongestants found in common cold remedies, decrease consumption of alcohol, perform routine monitoring of BP with home BP cuff, exercise, reduction of dietary salt, take medicines as prescribed, try not to miss doses and quit smoking.  Regular exercise and maintaining a healthy weight is needed.  Stress reduction may help. A CLINICAL SUMMARY including written plan identify barriers to care unique to individual due to social or financial issues.  We attempt to mutually creat solutions for individual and family understanding.  Longstanding persistent atrial fibrillation [facility]) Patient has a diagnosis of permanent atrial fibrillation.   Patient is on warfarin and has under control ventricular response.  Patient is CV stable . Long term (current) use of anticoagulants [Z79.01] -     AMB Referral to [place] Patient has no bleeding diathesis  Chronic bronchitis with COPD (chronic obstructive pulmonary disease) (HCC) -     AMB Referral to [facility] Coordinaton An individualize plan was formulated for care of COPD.  Treatment is evidence based.  She will continue on inhalers, avoid smoking and smoke.  Regular exercise with help with dyspnea. Routine follow ups and medication compliance is needed.  Osteopenia of lumbar spine Patient has osteopenia dn some back pain  Foot ulcer, left, limited to breakdown of skin [facility]) Patient still has preulcertive callus on left foot 5th MTP joint.  Redressed.  PAD (peripheral artery disease) ([place]) Patient has PVD but it is not operable.     I spent 30 minutes dedicated to the care of this patient on the date of this encounter  to include face-to-face time with the patient, as well as: records review  Follow-up: Return in about 6 months (around 06/07/2021) for fasting.  An After Visit Summary was printed and given to the patient.  Reinaldo Meeker, MD [facility] [phone]

## 2020-12-09 LAB — CBC WITH DIFFERENTIAL/PLATELET
Basophils Absolute: 0 10*3/uL (ref 0.0–0.2)
Basos: 1 %
EOS (ABSOLUTE): 0.1 10*3/uL (ref 0.0–0.4)
Eos: 3 %
Hematocrit: 41.5 % (ref 34.0–46.6)
Hemoglobin: 13.4 g/dL (ref 11.1–15.9)
Immature Grans (Abs): 0 10*3/uL (ref 0.0–0.1)
Immature Granulocytes: 1 %
Lymphocytes Absolute: 1 10*3/uL (ref 0.7–3.1)
Lymphs: 24 %
MCH: 29.4 pg (ref 26.6–33.0)
MCHC: 32.3 g/dL (ref 31.5–35.7)
MCV: 91 fL (ref 79–97)
Monocytes Absolute: 0.5 10*3/uL (ref 0.1–0.9)
Monocytes: 11 %
Neutrophils Absolute: 2.6 10*3/uL (ref 1.4–7.0)
Neutrophils: 60 %
Platelets: 252 10*3/uL (ref 150–450)
RBC: 4.56 x10E6/uL (ref 3.77–5.28)
RDW: 14.4 % (ref 11.7–15.4)
WBC: 4.2 10*3/uL (ref 3.4–10.8)

## 2020-12-09 LAB — COMPREHENSIVE METABOLIC PANEL
ALT: 25 IU/L (ref 0–32)
AST: 30 IU/L (ref 0–40)
Albumin/Globulin Ratio: 1.3 (ref 1.2–2.2)
Albumin: 4.3 g/dL (ref 3.6–4.6)
Alkaline Phosphatase: 97 IU/L (ref 44–121)
BUN/Creatinine Ratio: 14 (ref 12–28)
BUN: 14 mg/dL (ref 8–27)
Bilirubin Total: 0.5 mg/dL (ref 0.0–1.2)
CO2: 24 mmol/L (ref 20–29)
Calcium: 9.9 mg/dL (ref 8.7–10.3)
Chloride: 104 mmol/L (ref 96–106)
Creatinine, Ser: 1 mg/dL (ref 0.57–1.00)
GFR calc Af Amer: 59 mL/min/{1.73_m2} — ABNORMAL LOW (ref >59)
GFR calc non Af Amer: 51 mL/min/{1.73_m2} — ABNORMAL LOW (ref >59)
Globulin, Total: 3.3 g/dL (ref 1.5–4.5)
Glucose: 117 mg/dL — ABNORMAL HIGH (ref 65–99)
Potassium: 4.6 mmol/L (ref 3.5–5.2)
Sodium: 142 mmol/L (ref 134–144)
Total Protein: 7.6 g/dL (ref 6.0–8.5)

## 2020-12-09 LAB — LIPID PANEL
Chol/HDL Ratio: 3.1 ratio (ref 0.0–4.4)
Cholesterol, Total: 156 mg/dL (ref 100–199)
HDL: 51 mg/dL (ref >39)
LDL Chol Calc (NIH): 86 mg/dL (ref 0–99)
Triglycerides: 105 mg/dL (ref 0–149)
VLDL Cholesterol Cal: 19 mg/dL (ref 5–40)

## 2020-12-09 LAB — HEMOGLOBIN A1C
Est. average glucose Bld gHb Est-mCnc: 134 mg/dL
Hgb A1c MFr Bld: 6.3 % — ABNORMAL HIGH (ref 4.8–5.6)

## 2020-12-09 LAB — VITAMIN D 25 HYDROXY (VIT D DEFICIENCY, FRACTURES): Vit D, 25-Hydroxy: 75.7 ng/mL (ref 30.0–100.0)

## 2020-12-09 LAB — CARDIOVASCULAR RISK ASSESSMENT

## 2020-12-09 NOTE — Progress Notes (Signed)
Vitamin D 49.2 normal range, CBC normal, glucose 117, kidney tests stable, liver tests normal, Cholesterol normal, A1c 6.3 good lp

## 2020-12-22 DIAGNOSIS — K2100 Gastro-esophageal reflux disease with esophagitis, without bleeding: Secondary | ICD-10-CM | POA: Diagnosis not present

## 2020-12-23 DIAGNOSIS — H35342 Macular cyst, hole, or pseudohole, left eye: Secondary | ICD-10-CM | POA: Diagnosis not present

## 2020-12-23 DIAGNOSIS — H353132 Nonexudative age-related macular degeneration, bilateral, intermediate dry stage: Secondary | ICD-10-CM | POA: Diagnosis not present

## 2020-12-23 DIAGNOSIS — H40013 Open angle with borderline findings, low risk, bilateral: Secondary | ICD-10-CM | POA: Diagnosis not present

## 2020-12-23 DIAGNOSIS — H35033 Hypertensive retinopathy, bilateral: Secondary | ICD-10-CM | POA: Diagnosis not present

## 2020-12-23 LAB — HM DIABETES EYE EXAM

## 2020-12-30 ENCOUNTER — Encounter: Payer: Self-pay | Admitting: Legal Medicine

## 2021-01-01 ENCOUNTER — Other Ambulatory Visit: Payer: Self-pay | Admitting: Legal Medicine

## 2021-01-01 DIAGNOSIS — Z1231 Encounter for screening mammogram for malignant neoplasm of breast: Secondary | ICD-10-CM

## 2021-01-13 ENCOUNTER — Other Ambulatory Visit: Payer: Self-pay | Admitting: Cardiology

## 2021-01-14 ENCOUNTER — Other Ambulatory Visit: Payer: Self-pay | Admitting: Legal Medicine

## 2021-01-14 ENCOUNTER — Other Ambulatory Visit: Payer: Self-pay | Admitting: Obstetrics and Gynecology

## 2021-01-14 DIAGNOSIS — E2839 Other primary ovarian failure: Secondary | ICD-10-CM

## 2021-01-19 ENCOUNTER — Other Ambulatory Visit: Payer: Self-pay

## 2021-01-19 ENCOUNTER — Ambulatory Visit (INDEPENDENT_AMBULATORY_CARE_PROVIDER_SITE_OTHER): Payer: Medicare Other

## 2021-01-19 DIAGNOSIS — I4891 Unspecified atrial fibrillation: Secondary | ICD-10-CM

## 2021-01-19 DIAGNOSIS — I482 Chronic atrial fibrillation, unspecified: Secondary | ICD-10-CM

## 2021-01-19 DIAGNOSIS — Z7901 Long term (current) use of anticoagulants: Secondary | ICD-10-CM

## 2021-01-19 DIAGNOSIS — Z5181 Encounter for therapeutic drug level monitoring: Secondary | ICD-10-CM | POA: Diagnosis not present

## 2021-01-19 LAB — POCT INR: INR: 2.3 (ref 2.0–3.0)

## 2021-01-19 NOTE — Patient Instructions (Signed)
Continue taking 1/2 tablet daily except 1 tablet on Mondays.  Repeat INR in 8 weeks 

## 2021-02-01 ENCOUNTER — Other Ambulatory Visit: Payer: Self-pay

## 2021-02-01 ENCOUNTER — Ambulatory Visit
Admission: RE | Admit: 2021-02-01 | Discharge: 2021-02-01 | Disposition: A | Discharge status: Home / Self Care | Payer: Medicare Other | Source: Ambulatory Visit | Attending: Legal Medicine | Admitting: Legal Medicine

## 2021-02-01 DIAGNOSIS — Z1231 Encounter for screening mammogram for malignant neoplasm of breast: Secondary | ICD-10-CM

## 2021-02-07 NOTE — Progress Notes (Signed)
Normal mammogram ?lp

## 2021-02-10 ENCOUNTER — Other Ambulatory Visit: Payer: Self-pay | Admitting: Cardiology

## 2021-02-10 ENCOUNTER — Other Ambulatory Visit: Payer: Self-pay

## 2021-02-10 MED ORDER — LISINOPRIL 20 MG PO TABS: 20.0000 mg | ORAL_TABLET | Freq: Every day | ORAL | 0 refills | Status: DC

## 2021-02-16 DIAGNOSIS — Z79891 Long term (current) use of opiate analgesic: Secondary | ICD-10-CM | POA: Diagnosis not present

## 2021-02-18 ENCOUNTER — Telehealth: Payer: Self-pay | Admitting: Cardiology

## 2021-02-18 MED ORDER — FUROSEMIDE 40 MG PO TABS: 20.0000 mg | ORAL_TABLET | ORAL | 8 refills | Status: DC | PRN

## 2021-02-18 NOTE — Telephone Encounter (Signed)
Called back. Unable to leave voicemail.

## 2021-02-18 NOTE — Telephone Encounter (Signed)
Patient calling to speak with Malachy Mood. She did not want to give any further information to me.

## 2021-02-18 NOTE — Telephone Encounter (Signed)
Called pt to relay Dr. Doug Sou message. Pt is ok to stay off furosemide and keep it for PRN usage. All questions answered, pt verbalizes understanding.

## 2021-02-18 NOTE — Telephone Encounter (Signed)
Pt is returning a phone call to [place] from today. Please Advise

## 2021-02-18 NOTE — Telephone Encounter (Signed)
Spoke with pt regarding furosemide. Pt states that she would like to stop taking this medication because she is already spending quite a bit of time in the bathroom and this increases her need to go. Pt states that her breathing is good and she has no swelling at this time. Pt states that it has been suggested in the past for her to take this medication every other day to help with her urination frequency but she tells me that she just completely stopped taking medication about 2-3 months ago and just want to get Dr. Doug Sou ok. Pt states she is still taking potassium at this time.

## 2021-02-18 NOTE — Telephone Encounter (Signed)
She can have it available if she notices increased swelling or weight gain. If she hasn't taken in 2 months it appears she is OK to hold for now.   [name] [name] MD, [facility]

## 2021-03-12 ENCOUNTER — Telehealth: Payer: Self-pay | Admitting: Cardiology

## 2021-03-12 NOTE — Telephone Encounter (Signed)
Returned call to patient of Dr. [name]   She would like to know if she can get back on one of her medications but she does not know what the name of this is.  It is a round tube-like pill  Explained I cannot identify medications by what they look like Per chart review, furosemide was stopped per patient request in 02/18/21 phone note She said this is not the medication in question   She would like a call back from [place]

## 2021-03-12 NOTE — Telephone Encounter (Signed)
Patient was to get back on one of her medication and wants to verify Dr. Gwenlyn Found will be okay with it. Would like to for nurse to give her call. Please advise

## 2021-03-15 ENCOUNTER — Other Ambulatory Visit: Payer: Self-pay

## 2021-03-15 MED ORDER — SPIRIVA RESPIMAT 2.5 MCG/ACT IN AERS: 2.0000 | INHALATION_SPRAY | Freq: Every day | RESPIRATORY_TRACT | 6 refills | Status: DC

## 2021-03-15 NOTE — Telephone Encounter (Signed)
Spoke to patient she stated she needed Spiriva refilled.Refill sent to pharmacy.

## 2021-03-21 NOTE — Progress Notes (Signed)
[name] Date of Birth: 05-25-1932   History of Present Illness: [name] is seen for follow up Afib. She has a history of chronic atrial fibrillation, hypertensive heart disease, and pulmonary hypertension. She has a history of chronic back problems. She has moderate MR and COPD. In October 2019 she developed progressive weakness in her legs. We held her lipitor without improvement. MRI was done. This showed some chronic fatty atrophy of the muscles c/w denervation in the peroneal nerve distribution. We had discussed switching to a DOAC but she could not afford it. INR is followed in [place].  She is followed by podiatry for evaluation of a left foot ulcer. LE arterial dopplers done as noted below. She was seen by [name] and underwent angiography in June 2021. This  showed no significant aortoiliac disease.  There was mild to moderate left SFA and popliteal disease with one-vessel runoff below the knee via the peroneal artery which was free of significant disease.  There was mild disease in the TP trunk.  The anterior tibial and posterior tibial arteries were both occluded with reconstitution distally via the communicating branches of the peroneal artery with intact pedal arch.  He attempted revascularization of the anterior tibial artery to give her more blood flow but was not able to cross the occlusion antegrade given the length of the occlusion. Medical management recommended.   On follow up today she reports her ulcer is unchanged. It does hurt. Denies any chest pain. Breathing is a little worse with the hot weather. Is going to resume using Spiriva which has helped in the past. Feels swollen and thought her weight was up significantly but our scales show she has lost weight.     Current Outpatient Medications on File Prior to Visit  Medication Sig Dispense Refill  . ascorbic acid (VITAMIN C) 500 MG tablet Take 600 mg by mouth daily.     . Biotin 1000 MCG tablet Take 1  tablet by mouth daily.     . Calcium Carbonate-Vitamin D (CALCIUM + D PO) Take by mouth daily.    . Cholecalciferol (VITAMIN D PO) Take by mouth daily.    . Cyanocobalamin (VITAMIN B-12 PO) Take 1 tablet by mouth daily.     Marland Kitchen diltiazem (TIAZAC) 120 MG 24 hr capsule Take 1 capsule (120 mg total) by mouth daily. 90 capsule 3  . folic acid (FOLVITE) 1 MG tablet Take 1 mg by mouth daily.    . furosemide (LASIX) 40 MG tablet Take 0.5 tablets (20 mg total) by mouth as needed for fluid or edema (shortness of breath). 45 tablet 8  . gabapentin (NEURONTIN) 300 MG capsule Take 600 mg by mouth at bedtime.     Marland Kitchen HYDROcodone-acetaminophen (NORCO) 10-325 MG per tablet Take 1 tablet by mouth every 6 (six) hours as needed for moderate pain.     Marland Kitchen lisinopril (ZESTRIL) 20 MG tablet Take 1 tablet (20 mg total) by mouth daily. 90 tablet 0  . pantoprazole (PROTONIX) 40 MG tablet Take 40 mg by mouth daily.    . potassium chloride SA (KLOR-CON) 20 MEQ tablet TAKE ONE TABLET BY MOUTH TWICE DAILY 180 tablet 1  . sertraline (ZOLOFT) 50 MG tablet Take 75 mg by mouth daily.     . Tiotropium Bromide Monohydrate (SPIRIVA RESPIMAT) 2.5 MCG/ACT AERS Inhale 2 puffs into the lungs daily. 1 g 6  . valACYclovir (VALTREX) 500 MG tablet Take 500 mg by mouth 2 (two) times daily.    Marland Kitchen  vitamin A 8000 UNIT capsule Take 8,000 Units by mouth daily.    Marland Kitchen warfarin (COUMADIN) 5 MG tablet Take 0.5-1 tablets (2.5-5 mg total) by mouth See admin instructions. 5 mg on Mondays and 2.5 mg all other days 48 tablet 0  . atorvastatin (LIPITOR) 80 MG tablet Take 1 tablet (80 mg total) by mouth daily. 90 tablet 3   No current facility-administered medications on file prior to visit.    Allergies  Allergen Reactions  . Sulfanilamide     unkn  . Penicillins Rash    Past Medical History:  Diagnosis Date  . Arthritis   . Back pain, chronic   . Coronary artery disease CARDIOLOGIST- DR [name]--  LOV IN EPIC  . Depression   . Hammer toe   .  Hypercholesterolemia   . Hypertensive heart disease    WITH LVH  . LVH (left ventricular hypertrophy) due to hypertensive disease   . Mitral insufficiency    CHRONIC  . Pulmonary hypertension ([place])   . Transfusion history     Past Surgical History:  Procedure Laterality Date  . ABDOMINAL AORTOGRAM W/LOWER EXTREMITY N/A 04/29/2020   Procedure: ABDOMINAL AORTOGRAM W/LOWER EXTREMITY;  Surgeon: [name], MD;  Location: [facility];  Service: Cardiovascular;  Laterality: N/A;  Lt Leg  . ABDOMINAL HYSTERECTOMY    . ANTERIOR REPAIR/ [place] PUBOVAGINAL SLING  05-03-2002  . BUNIONECTOMY WITH HAMMERTOE RECONSTRUCTION Right 03/01/2013   Procedure: RIGHT FOOT EXCISION BUNIONETTE AND FUSION OF DIP FOURTH TOE;  Surgeon: [name], MD;  Location: [facility];  Service: Orthopedics;  Laterality: Right;  . CARDIAC CATHETERIZATION  02-03-2009  DR  [name]   SINGLE-VESSEL OBSTRUCTIVE CAD, SMALL DIAGONAL BRANCH/ NORMAL LVF/ SEVERE MITRAL INSUFFICIENCY/ SEVERE PULMONARY HYPERTENSION  . CATARACT EXTRACTION W/ INTRAOCULAR LENS  IMPLANT, BILATERAL  2003  . COLONOSCOPY  07/25/2014   few mall scattered diverticula in the ectire examined colon. Small internal hemmorhoids.   Marland Kitchen HAMMER TOE SURGERY  02/28/2012   Procedure: HAMMER TOE CORRECTION;  Surgeon: [name], MD;  Location: [facility];  Service: Orthopedics;  Laterality: Right;  FUSION OF PROXIMAL INTERPHALANGEAL  RIGHT THIRD CLAW TOE  . PARS PLANA VITRECTOMY W/ REPAIR OF MACULAR HOLE  02-04-2002   LEFT EYE  . PERIPHERAL VASCULAR BALLOON ANGIOPLASTY  04/29/2020   Procedure: PERIPHERAL VASCULAR BALLOON ANGIOPLASTY;  Surgeon: [name], MD;  Location: [facility];  Service: Cardiovascular;;  Lt. AT  . REPAIR RIGHT SECOND CLAW TOE/ VARUS MEDIAL CLOSING WEDGE OSTEOTOMY PROXIMAL PHALANX RIGHT GREAT TOE  05-05-2005  . TOTAL HIP ARTHROPLASTY     BILATERAL---  LEFT 4/98;   RIGHT  11/98  . VARUS  OSTEOTOMY PROXIMAL PHALANX, LEFT GREAT TOE/ PARING DOWN THE CALLUS , SECOND LEFT TOE  10-27-2008    Social History   Tobacco Use  Smoking Status Never Smoker  Smokeless Tobacco Never Used    Social History   Substance and Sexual Activity  Alcohol Use Not Currently   Comment: occasional    Family History  Problem Relation Age of Onset  . Heart disease Mother 44  . Prostate cancer Father 19    Review of Systems:  As noted in history of present illness.  All other systems were reviewed and are negative.  Physical Exam: BP 138/84   Pulse (!) 103   Ht 5\' 5"  (1.651 m)   Wt 160 lb (72.6 kg)   SpO2 93%   BMI 26.63  kg/m  GENERAL:  Well appearing WF in NAD HEENT:  PERRL, EOMI, sclera are clear. Oropharynx is clear. NECK:  No jugular venous distention, carotid upstroke brisk and symmetric, no bruits, no thyromegaly or adenopathy LUNGS:  Clear to auscultation bilaterally CHEST:  Unremarkable HEART:  RRR,  PMI not displaced or sustained,S1 and S2 within normal limits, no S3, no S4: no clicks, no rubs, gr 2/6 systolic murmur apex.  ABD:  Soft, nontender. BS +, no masses or bruits. No hepatomegaly, no splenomegaly EXT: poor pedal pulses, no edema, venous varicosities. There a a calloused area over the lateral metatarsal joint on the left with ulceration. No redness or drainage.  SKIN:  Warm and dry.  No rashes NEURO:  Alert and oriented x 3. Cranial nerves II through XII intact. PSYCH:  Cognitively intact     LABORATORY DATA:  Lab Results  Component Value Date   WBC 4.2 12/08/2020   HGB 13.4 12/08/2020   HCT 41.5 12/08/2020   PLT 252 12/08/2020   GLUCOSE 117 (H) 12/08/2020   CHOL 156 12/08/2020   TRIG 105 12/08/2020   HDL 51 12/08/2020   LDLCALC 86 12/08/2020   ALT 25 12/08/2020   AST 30 12/08/2020   NA 142 12/08/2020   K 4.6 12/08/2020   CL 104 12/08/2020   CREATININE 1.00 12/08/2020   BUN 14 12/08/2020   CO2 24 12/08/2020   TSH 2.340 03/26/2020   INR 2.3  01/19/2021   HGBA1C 6.3 (H) 12/08/2020   MICROALBUR 30 05/13/2020    Labs reviewed from 02/24/17: cholesterol 174, triglycerides 110, LDL 90, HDL 62. A1c 6.0%. CMET and TSH normal. Dated 08/24/17: cholesterol 157, triglycerides 106, HDL 55, LDL 90. A1c 6.1%. CMET and CBC normal  Ecg today shows AFib with rate 103. Low voltage. Nonspecific ST-T abnormality. RAD. I have personally reviewed and interpreted this study.   LE arterial dopplers 03/06/20:  Summary:  Left: Moderate progression is noted compared to previous study.   Duplex imaging showed heterogenous plaque in the CFA, DFA, SFA, popliteal  artery and TPT. Medial calcifications noted in the tibial vessels.  50-74% stenosis in the TPT, based on velocity ratio.  Probable one vessel run-off via peroneal artery.  Total occlusion noted in the PTA.  Occluded proximal to mid ATA.   LE ABIs 03/06/20: Right ABIs appear increased compared to prior study on 02/21/2013. Left  ABIs appear essentially unchanged compared to prior study on 02/21/2013.  Left ABIs are unreliable; arterial wall calcification precludes accurate  ankle pressure and ABIs. Right TBIs  are essentially unchanged; left TBIs have decreased.  Summary:  Right: Resting right ankle-brachial index indicates noncompressible right  lower extremity arteries. The right toe-brachial index is normal.   Left: The left toe-brachial index is abnormal. ABIs are unreliable.  Arterial wall calcification precludes accurate ankle pressure and ABIs.     Assessment / Plan: 1. Atrial fibrillation, permanent. Rate is  controlled on diltiazem. She is anticoagulated on Coumadin. INR has been therapeutic.  2. Mild to Moderate MR. Last Echo 8/16. Asymptomatic. Exam is unchanged.   3. Hypertension with hypertensive heart disease, BP is controlled.  4. Hyperlipidemia on statin therapy. Last LDL 86   On lipitor at 80 mg daily.  Encourage healthy eating.   5. Pulmonary  hypertension-moderate  6. Coronary disease. Cardiac catheterization in 2010 showed severe stenosis in a small diagonal branch. She otherwise had no significant disease. She is asymptomatic.   7. Chronic bronchitis and COPD. Chronic  dyspnea.  Will resume Spiriva.   8. PAD - has single vessel run off below the knee. Has nonhealing ulcer on the left. Unable to cross occlusion percutaneously. Continue medical management. Ulcer is stable.   Follow up in 6 months.

## 2021-03-24 ENCOUNTER — Other Ambulatory Visit: Payer: Self-pay

## 2021-03-24 ENCOUNTER — Ambulatory Visit (INDEPENDENT_AMBULATORY_CARE_PROVIDER_SITE_OTHER): Payer: Medicare Other | Admitting: Cardiology

## 2021-03-24 ENCOUNTER — Ambulatory Visit (INDEPENDENT_AMBULATORY_CARE_PROVIDER_SITE_OTHER): Payer: Medicare Other

## 2021-03-24 ENCOUNTER — Encounter: Payer: Self-pay | Admitting: Cardiology

## 2021-03-24 VITALS — BP 138/84 | HR 103 | Ht 65.0 in | Wt 160.0 lb

## 2021-03-24 DIAGNOSIS — I739 Peripheral vascular disease, unspecified: Secondary | ICD-10-CM

## 2021-03-24 DIAGNOSIS — I10 Essential (primary) hypertension: Secondary | ICD-10-CM | POA: Diagnosis not present

## 2021-03-24 DIAGNOSIS — Z5181 Encounter for therapeutic drug level monitoring: Secondary | ICD-10-CM | POA: Diagnosis not present

## 2021-03-24 DIAGNOSIS — I482 Chronic atrial fibrillation, unspecified: Secondary | ICD-10-CM | POA: Diagnosis not present

## 2021-03-24 DIAGNOSIS — Z7901 Long term (current) use of anticoagulants: Secondary | ICD-10-CM

## 2021-03-24 DIAGNOSIS — I4891 Unspecified atrial fibrillation: Secondary | ICD-10-CM | POA: Diagnosis not present

## 2021-03-24 LAB — POCT INR: INR: 2.3 (ref 2.0–3.0)

## 2021-03-24 NOTE — Addendum Note (Signed)
Addended by: Kathyrn Lass on: 03/24/2021 10:08 AM   Modules accepted: Orders

## 2021-03-24 NOTE — Patient Instructions (Signed)
Continue taking 1/2 tablet daily except 1 tablet on Mondays.  Repeat INR in 8 weeks 

## 2021-04-09 DIAGNOSIS — Z23 Encounter for immunization: Secondary | ICD-10-CM | POA: Diagnosis not present

## 2021-04-14 ENCOUNTER — Other Ambulatory Visit: Payer: Self-pay | Admitting: Cardiology

## 2021-04-28 ENCOUNTER — Telehealth: Payer: Self-pay | Admitting: Cardiology

## 2021-04-28 NOTE — Telephone Encounter (Signed)
Pt c/o medication issue:  1. Name of Medication: Oxybutynin  2. How are you currently taking this medication (dosage and times per day)? Not currently taking  3. Are you having a reaction (difficulty breathing--STAT)? no  4. What is your medication issue? Patient would like to speak with Malachy Mood to find out if Dr. [name] would prescribe her the medication.

## 2021-04-28 NOTE — Telephone Encounter (Signed)
Returned call to patient unable to leave a message mail box is full.

## 2021-04-28 NOTE — Telephone Encounter (Signed)
Pt returning phone call, Nurse Pugh states she will call back currently on a call

## 2021-04-28 NOTE — Telephone Encounter (Signed)
Spoke to patient she stated she is urinating frequently.Advised to call PCP.Dr.Jordan does not prescribe Oxybutynin.

## 2021-04-29 ENCOUNTER — Other Ambulatory Visit: Payer: Self-pay | Admitting: Cardiology

## 2021-05-03 ENCOUNTER — Ambulatory Visit: Payer: Medicare Other | Admitting: Podiatry

## 2021-05-05 ENCOUNTER — Telehealth: Payer: Self-pay

## 2021-05-05 NOTE — Telephone Encounter (Signed)
Patient called requesting that we cancel all her future appointments she is now seeing a pcp in [place] where she lives. She stated it has become to difficult for her to travel to [facility] for her appointments. YL,RMA

## 2021-05-18 ENCOUNTER — Other Ambulatory Visit: Payer: Self-pay

## 2021-05-18 ENCOUNTER — Ambulatory Visit (INDEPENDENT_AMBULATORY_CARE_PROVIDER_SITE_OTHER): Payer: Medicare Other

## 2021-05-18 DIAGNOSIS — I4891 Unspecified atrial fibrillation: Secondary | ICD-10-CM

## 2021-05-18 DIAGNOSIS — Z5181 Encounter for therapeutic drug level monitoring: Secondary | ICD-10-CM

## 2021-05-18 DIAGNOSIS — I482 Chronic atrial fibrillation, unspecified: Secondary | ICD-10-CM

## 2021-05-18 DIAGNOSIS — Z7901 Long term (current) use of anticoagulants: Secondary | ICD-10-CM | POA: Diagnosis not present

## 2021-05-18 LAB — POCT INR: INR: 2.8 (ref 2.0–3.0)

## 2021-05-18 NOTE — Patient Instructions (Signed)
Continue taking 1/2 tablet daily except 1 tablet on Mondays.  Repeat INR in 8 weeks

## 2021-05-19 ENCOUNTER — Ambulatory Visit (INDEPENDENT_AMBULATORY_CARE_PROVIDER_SITE_OTHER): Payer: Medicare Other | Admitting: Legal Medicine

## 2021-05-19 ENCOUNTER — Ambulatory Visit: Payer: Medicare Other | Admitting: Nurse Practitioner

## 2021-05-19 ENCOUNTER — Encounter: Payer: Self-pay | Admitting: Legal Medicine

## 2021-05-19 VITALS — BP 135/80 | HR 99 | Temp 96.7°F | Resp 15 | Ht 65.0 in | Wt 169.0 lb

## 2021-05-19 DIAGNOSIS — Z7901 Long term (current) use of anticoagulants: Secondary | ICD-10-CM | POA: Diagnosis not present

## 2021-05-19 DIAGNOSIS — E782 Mixed hyperlipidemia: Secondary | ICD-10-CM

## 2021-05-19 DIAGNOSIS — Z79891 Long term (current) use of opiate analgesic: Secondary | ICD-10-CM | POA: Diagnosis not present

## 2021-05-19 DIAGNOSIS — I739 Peripheral vascular disease, unspecified: Secondary | ICD-10-CM

## 2021-05-19 DIAGNOSIS — J449 Chronic obstructive pulmonary disease, unspecified: Secondary | ICD-10-CM

## 2021-05-19 DIAGNOSIS — R7303 Prediabetes: Secondary | ICD-10-CM | POA: Diagnosis not present

## 2021-05-19 DIAGNOSIS — N3000 Acute cystitis without hematuria: Secondary | ICD-10-CM

## 2021-05-19 DIAGNOSIS — I10 Essential (primary) hypertension: Secondary | ICD-10-CM

## 2021-05-19 DIAGNOSIS — N3941 Urge incontinence: Secondary | ICD-10-CM | POA: Diagnosis not present

## 2021-05-19 DIAGNOSIS — J4489 Other specified chronic obstructive pulmonary disease: Secondary | ICD-10-CM

## 2021-05-19 DIAGNOSIS — I272 Pulmonary hypertension, unspecified: Secondary | ICD-10-CM | POA: Diagnosis not present

## 2021-05-19 DIAGNOSIS — I4811 Longstanding persistent atrial fibrillation: Secondary | ICD-10-CM

## 2021-05-19 LAB — POCT URINALYSIS DIP (CLINITEK)
Bilirubin, UA: NEGATIVE
Blood, UA: NEGATIVE
Glucose, UA: NEGATIVE mg/dL
Ketones, POC UA: NEGATIVE mg/dL
Nitrite, UA: NEGATIVE
Spec Grav, UA: 1.025 (ref 1.010–1.025)
Urobilinogen, UA: 0.2 E.U./dL
pH, UA: 5.5 (ref 5.0–8.0)

## 2021-05-19 MED ORDER — OXYBUTYNIN CHLORIDE 5 MG PO TABS: 5.0000 mg | ORAL_TABLET | Freq: Three times a day (TID) | ORAL | 2 refills | Status: DC

## 2021-05-19 NOTE — Progress Notes (Signed)
Established Patient Office Visit  Subjective:  Patient ID: [name], female    DOB: 10/25/1932  Age: 87 y.o. MRN: [id number]  CC:  Chief Complaint  Patient presents with   Urinary Incontinence    HPI [name] presents for urinary incontinence.  She saw Dr. Maggie Schwalbe for this. She has urge incontinence. She wear pads.no nocturia.  Every 30 minutes urinates.  Patient has prediabetes  not check in 5 months  Patient presents for follow up of hypertension.  Patient tolerating lisinopril well with side effects.  Patient was diagnosed with hypertension 2010 so has been treated for hypertension for 10 years.Patient is working on maintaining diet and exercise regimen and follows up as directed. Complication include none  Patient presents with hyperlipidemia.  Compliance with treatment has been good; patient takes medicines as directed, maintains low cholesterol diet, follows up as directed, and maintains exercise regimen.  Patient is using atorvastatin without problems. .   Past Medical History:  Diagnosis Date   Arthritis    Back pain, chronic    Coronary artery disease CARDIOLOGIST- DR [name]--  LOV IN EPIC   Depression    Hammer toe    Hypercholesterolemia    Hypertensive heart disease    WITH LVH   LVH (left ventricular hypertrophy) due to hypertensive disease    Mitral insufficiency    CHRONIC   Pulmonary hypertension ([place])    Transfusion history     Past Surgical History:  Procedure Laterality Date   ABDOMINAL AORTOGRAM W/LOWER EXTREMITY N/A 04/29/2020   Procedure: ABDOMINAL AORTOGRAM W/LOWER EXTREMITY;  Surgeon: [name], MD;  Location: [facility];  Service: Cardiovascular;  Laterality: N/A;  Lt Leg   ABDOMINAL HYSTERECTOMY     ANTERIOR REPAIR/ [place] PUBOVAGINAL SLING  05-03-2002   BUNIONECTOMY WITH HAMMERTOE RECONSTRUCTION Right 03/01/2013   Procedure: RIGHT FOOT EXCISION BUNIONETTE AND FUSION OF DIP FOURTH TOE;  Surgeon: [name],  MD;  Location: [place];  Service: Orthopedics;  Laterality: Right;   CARDIAC CATHETERIZATION  02-03-2009  DR  [name]   SINGLE-VESSEL OBSTRUCTIVE CAD, SMALL DIAGONAL BRANCH/ NORMAL LVF/ SEVERE MITRAL INSUFFICIENCY/ SEVERE PULMONARY HYPERTENSION   CATARACT EXTRACTION W/ INTRAOCULAR LENS  IMPLANT, BILATERAL  2003   COLONOSCOPY  07/25/2014   few mall scattered diverticula in the ectire examined colon. Small internal hemmorhoids.    HAMMER TOE SURGERY  02/28/2012   Procedure: HAMMER TOE CORRECTION;  Surgeon: [name], MD;  Location: [facility];  Service: Orthopedics;  Laterality: Right;  FUSION OF PROXIMAL INTERPHALANGEAL  RIGHT THIRD CLAW TOE   PARS PLANA VITRECTOMY W/ REPAIR OF MACULAR HOLE  02-04-2002   LEFT EYE   PERIPHERAL VASCULAR BALLOON ANGIOPLASTY  04/29/2020   Procedure: PERIPHERAL VASCULAR BALLOON ANGIOPLASTY;  Surgeon: [name], MD;  Location: [place] [facility];  Service: Cardiovascular;;  Lt. AT   REPAIR RIGHT SECOND CLAW TOE/ VARUS MEDIAL CLOSING WEDGE OSTEOTOMY PROXIMAL PHALANX RIGHT GREAT TOE  05-05-2005   TOTAL HIP ARTHROPLASTY     BILATERAL---  LEFT 4/98;   RIGHT  11/98   VARUS OSTEOTOMY PROXIMAL PHALANX, LEFT GREAT TOE/ PARING DOWN THE CALLUS , SECOND LEFT TOE  10-27-2008    Family History  Problem Relation Age of Onset   Heart disease Mother 70   Prostate cancer Father 32    Social History   Socioeconomic History   Marital status: Divorced    Spouse name: Not on file   Number of children:  3   Years of education: College   Highest education level: Not on file  Occupational History   Occupation: Retired    Fish farm manager: RETIRED  Tobacco Use   Smoking status: Never   Smokeless tobacco: Never  Vaping Use   Vaping Use: Never used  Substance and Sexual Activity   Alcohol use: Not Currently    Comment: occasional   Drug use: Yes    Types: Hydrocodone   Sexual activity: Not Currently  Other Topics Concern   Not on  file  Social History Narrative   Patient lives at home alone.   Caffeine Use: 1 cup daily occasionally   Social Determinants of Health   Financial Resource Strain: Not on file  Food Insecurity: Not on file  Transportation Needs: Not on file  Physical Activity: Not on file  Stress: Not on file  Social Connections: Not on file  Intimate Partner Violence: Not on file    Outpatient Medications Prior to Visit  Medication Sig Dispense Refill   ascorbic acid (VITAMIN C) 500 MG tablet Take 600 mg by mouth daily.      Biotin 1000 MCG tablet Take 1 tablet by mouth daily.      Calcium Carbonate-Vitamin D (CALCIUM + D PO) Take by mouth daily.     Cholecalciferol (VITAMIN D PO) Take by mouth daily.     Cyanocobalamin (VITAMIN B-12 PO) Take 1 tablet by mouth daily.      diltiazem (TIAZAC) 120 MG 24 hr capsule Take 1 capsule (120 mg total) by mouth daily. 90 capsule 3   folic acid (FOLVITE) 1 MG tablet Take 1 mg by mouth daily.     furosemide (LASIX) 40 MG tablet Take 0.5 tablets (20 mg total) by mouth as needed for fluid or edema (shortness of breath). 45 tablet 8   gabapentin (NEURONTIN) 300 MG capsule Take 600 mg by mouth at bedtime.      HYDROcodone-acetaminophen (NORCO) 10-325 MG per tablet Take 1 tablet by mouth every 6 (six) hours as needed for moderate pain.      lisinopril (ZESTRIL) 20 MG tablet Take 1 tablet (20 mg total) by mouth daily. 90 tablet 0   pantoprazole (PROTONIX) 40 MG tablet Take 40 mg by mouth daily.     potassium chloride SA (KLOR-CON) 20 MEQ tablet TAKE ONE TABLET BY MOUTH TWICE DAILY 180 tablet 3   sertraline (ZOLOFT) 50 MG tablet Take 75 mg by mouth daily.      Tiotropium Bromide Monohydrate (SPIRIVA RESPIMAT) 2.5 MCG/ACT AERS Inhale 2 puffs into the lungs daily. 1 g 6   valACYclovir (VALTREX) 500 MG tablet Take 500 mg by mouth 2 (two) times daily.     vitamin A 8000 UNIT capsule Take 8,000 Units by mouth daily.     warfarin (COUMADIN) 5 MG tablet Take 0.5-1 tablets  (2.5-5 mg total) by mouth See admin instructions. 5 mg on Mondays and 2.5 mg all other days 48 tablet 0   atorvastatin (LIPITOR) 80 MG tablet Take 1 tablet (80 mg total) by mouth daily. 90 tablet 3   No facility-administered medications prior to visit.    Allergies  Allergen Reactions   Sulfanilamide     unkn   Penicillins Rash    ROS Review of Systems  Constitutional:  Negative for activity change and appetite change.  HENT: Negative.    Eyes:  Negative for visual disturbance.  Cardiovascular:  Negative for chest pain.  Gastrointestinal:  Negative for abdominal distention and abdominal pain.  Genitourinary:  Positive for difficulty urinating, frequency and urgency.  Musculoskeletal: Negative.   Skin: Negative.      Objective:    Physical Exam Vitals reviewed.  Constitutional:      Appearance: Normal appearance.  HENT:     Right Ear: Tympanic membrane normal.     Left Ear: Tympanic membrane normal.  Cardiovascular:     Rate and Rhythm: Normal rate and regular rhythm.     Pulses: Normal pulses.     Heart sounds: Murmur (systolic) heard.  Pulmonary:     Effort: Pulmonary effort is normal.     Breath sounds: Normal breath sounds.  Abdominal:     General: Abdomen is flat. Bowel sounds are normal. There is no distension.     Palpations: Abdomen is soft.     Tenderness: There is no abdominal tenderness.  Musculoskeletal:     Right lower leg: No edema.     Left lower leg: No edema.  Skin:    General: Skin is warm.     Capillary Refill: Capillary refill takes less than 2 seconds.  Neurological:     General: No focal deficit present.     Mental Status: She is alert.                      BP 135/80   Pulse 99   Temp (!) 96.7 F (35.9 C)   Resp 15   Ht 5\' 5"  (1.651 m)   Wt 169 lb (76.7 kg)   SpO2 96%   BMI 28.12 kg/m  Wt Readings from Last 3 Encounters:  05/19/21 169 lb (76.7 kg)  03/24/21 160 lb (72.6 kg)  12/08/20 169 lb (76.7 kg)     Health Maintenance  Due  Topic Date Due   TETANUS/TDAP  04/17/2021    There are no preventive care reminders to display for this patient.  Lab Results  Component Value Date   TSH 2.340 03/26/2020   Lab Results  Component Value Date   WBC 4.2 12/08/2020   HGB 13.4 12/08/2020   HCT 41.5 12/08/2020   MCV 91 12/08/2020   PLT 252 12/08/2020   Lab Results  Component Value Date   NA 142 12/08/2020   K 4.6 12/08/2020   CO2 24 12/08/2020   GLUCOSE 117 (H) 12/08/2020   BUN 14 12/08/2020   CREATININE 1.00 12/08/2020   BILITOT 0.5 12/08/2020   ALKPHOS 97 12/08/2020   AST 30 12/08/2020   ALT 25 12/08/2020   PROT 7.6 12/08/2020   ALBUMIN 4.3 12/08/2020   CALCIUM 9.9 12/08/2020   GFR 81.95 02/23/2012   Lab Results  Component Value Date   CHOL 156 12/08/2020   Lab Results  Component Value Date   HDL 51 12/08/2020   Lab Results  Component Value Date   LDLCALC 86 12/08/2020   Lab Results  Component Value Date   TRIG 105 12/08/2020   Lab Results  Component Value Date   CHOLHDL 3.1 12/08/2020   Lab Results  Component Value Date   HGBA1C 6.3 (H) 12/08/2020      Assessment & Plan:   Problem List Items Addressed This Visit       Cardiovascular and Mediastinum   Essential hypertension   Relevant Orders   CBC with Differential/Platelet   Comprehensive metabolic panel An individual hypertension care plan was established and reinforced today.  The patient's status was assessed using clinical findings on exam and labs or diagnostic tests. The patient's success  at meeting treatment goals on disease specific evidence-based guidelines and found to be fair controlled. SELF MANAGEMENT: The patient and I together assessed ways to personally work towards obtaining the recommended goals. RECOMMENDATIONS: avoid decongestants found in common cold remedies, decrease consumption of alcohol, perform routine monitoring of BP with home BP cuff, exercise, reduction of dietary salt, take medicines as  prescribed, try not to miss doses and quit smoking.  Regular exercise and maintaining a healthy weight is needed.  Stress reduction may help. A CLINICAL SUMMARY including written plan identify barriers to care unique to individual due to social or financial issues.  We attempt to mutually creat solutions for individual and family understanding.     Pulmonary hypertension ([place]) Patien thas chronic pulmonary hypertension    A-fib [facility]) Patient has a diagnosis of permanent atrial fibrillation.   Patient is on coumadin and has controlled ventricular response.  Patient is CV stable .    PVD (peripheral vascular disease) ([place]) Patient has peripheral arterial disease but no claudication     Respiratory   Chronic bronchitis with COPD (chronic obstructive pulmonary disease) ([place]) An individualize plan was formulated for care of COPD.  Treatment is evidence based.  She will continue on inhalers, avoid smoking and smoke.  Regular exercise with help with dyspnea. Routine follow ups and medication compliance is needed.      Other   Hyperlipidemia   Relevant Orders   Lipid panel AN INDIVIDUAL CARE PLAN for hyperlipidemia/ cholesterol was established and reinforced today.  The patient's status was assessed using clinical findings on exam, lab and other diagnostic tests. The patient's disease status was assessed based on evidence-based guidelines and found to be fair controlled. MEDICATIONS were reviewed. SELF MANAGEMENT GOALS have been discussed and patient's success at attaining the goal of low cholesterol was assessed. RECOMMENDATION given include regular exercise 3 days a week and low cholesterol/low fat diet. CLINICAL SUMMARY including written plan to identify barriers unique to the patient due to social or economic  reasons was discussed.     Long term (current) use of anticoagulants [Z79.01] Patient on anticoagulants for Atrial fibrillation    Prediabetes - Primary   Relevant Orders    Hemoglobin A1c Patient has chronic prediabetes    Long-term current use of opiate analgesic AN INDIVIDUAL CARE PLAN was established and reinforced today.  The patient's status was assessed using clinical findings on exam, labs, and other diagnostic testing. Patient's success at meeting treatment goals based on disease specific evidence-bassed guidelines and found to be in fair control. RECOMMENDATIONS include maintining present medicines and treatment. He is on chronichydrocodone  with no abuse.  Negative REMS.     Urge incontinence   Relevant Medications   oxybutynin (DITROPAN) 5 MG tablet   Other Relevant Orders   POCT URINALYSIS DIP (CLINITEK) (Completed) Patient has chronic urge incontinence   Other Visit Diagnoses     Acute cystitis without hematuria       Relevant Orders   Urine Culture Patient has pyuria so urine culture performed     30 minute visit  Meds ordered this encounter  Medications   oxybutynin (DITROPAN) 5 MG tablet    Sig: Take 1 tablet (5 mg total) by mouth 3 (three) times daily.    Dispense:  90 tablet    Refill:  2    Follow-up: Return in about 1 month (around 06/19/2021) for for urge incontinence.    Reinaldo Meeker, MD

## 2021-05-20 LAB — CBC WITH DIFFERENTIAL/PLATELET
Basophils Absolute: 0 10*3/uL (ref 0.0–0.2)
Basos: 1 %
EOS (ABSOLUTE): 0.1 10*3/uL (ref 0.0–0.4)
Eos: 2 %
Hematocrit: 44.1 % (ref 34.0–46.6)
Hemoglobin: 14.2 g/dL (ref 11.1–15.9)
Immature Grans (Abs): 0 10*3/uL (ref 0.0–0.1)
Immature Granulocytes: 0 %
Lymphocytes Absolute: 1.3 10*3/uL (ref 0.7–3.1)
Lymphs: 24 %
MCH: 29.4 pg (ref 26.6–33.0)
MCHC: 32.2 g/dL (ref 31.5–35.7)
MCV: 91 fL (ref 79–97)
Monocytes Absolute: 0.6 10*3/uL (ref 0.1–0.9)
Monocytes: 12 %
Neutrophils Absolute: 3.4 10*3/uL (ref 1.4–7.0)
Neutrophils: 61 %
Platelets: 245 10*3/uL (ref 150–450)
RBC: 4.83 x10E6/uL (ref 3.77–5.28)
RDW: 13.9 % (ref 11.7–15.4)
WBC: 5.5 10*3/uL (ref 3.4–10.8)

## 2021-05-20 LAB — LIPID PANEL
Chol/HDL Ratio: 2.9 ratio (ref 0.0–4.4)
Cholesterol, Total: 165 mg/dL (ref 100–199)
HDL: 56 mg/dL (ref >39)
LDL Chol Calc (NIH): 89 mg/dL (ref 0–99)
Triglycerides: 109 mg/dL (ref 0–149)
VLDL Cholesterol Cal: 20 mg/dL (ref 5–40)

## 2021-05-20 LAB — COMPREHENSIVE METABOLIC PANEL
ALT: 38 IU/L — ABNORMAL HIGH (ref 0–32)
AST: 40 IU/L (ref 0–40)
Albumin/Globulin Ratio: 1.6 (ref 1.2–2.2)
Albumin: 4.7 g/dL — ABNORMAL HIGH (ref 3.6–4.6)
Alkaline Phosphatase: 99 IU/L (ref 44–121)
BUN/Creatinine Ratio: 21 (ref 12–28)
BUN: 19 mg/dL (ref 8–27)
Bilirubin Total: 0.5 mg/dL (ref 0.0–1.2)
CO2: 22 mmol/L (ref 20–29)
Calcium: 9.9 mg/dL (ref 8.7–10.3)
Chloride: 106 mmol/L (ref 96–106)
Creatinine, Ser: 0.92 mg/dL (ref 0.57–1.00)
Globulin, Total: 3 g/dL (ref 1.5–4.5)
Glucose: 115 mg/dL — ABNORMAL HIGH (ref 65–99)
Potassium: 4.7 mmol/L (ref 3.5–5.2)
Sodium: 145 mmol/L — ABNORMAL HIGH (ref 134–144)
Total Protein: 7.7 g/dL (ref 6.0–8.5)
eGFR: 60 mL/min/{1.73_m2} (ref >59)

## 2021-05-20 LAB — CARDIOVASCULAR RISK ASSESSMENT

## 2021-05-20 LAB — HEMOGLOBIN A1C
Est. average glucose Bld gHb Est-mCnc: 143 mg/dL
Hgb A1c MFr Bld: 6.6 % — ABNORMAL HIGH (ref 4.8–5.6)

## 2021-05-20 NOTE — Progress Notes (Signed)
Cbc normal, glucose 115, kidney tests normal, one liver test elevated, A1c 6.6, cholesterol normal lp

## 2021-05-22 LAB — URINE CULTURE

## 2021-05-23 NOTE — Progress Notes (Signed)
Lactobacillis species such as in yogert, not an infection lp

## 2021-05-24 ENCOUNTER — Other Ambulatory Visit: Payer: Self-pay

## 2021-05-26 ENCOUNTER — Other Ambulatory Visit: Payer: Self-pay | Admitting: Legal Medicine

## 2021-06-07 ENCOUNTER — Telehealth: Payer: Self-pay | Admitting: Cardiology

## 2021-06-07 NOTE — Telephone Encounter (Signed)
New Message:    Pt said she would like for you to call her today if possible please.

## 2021-06-07 NOTE — Telephone Encounter (Signed)
Spoke to patient she received a letter from office saying time to schedule appointment with Dr.Jordan.Stated she already has appointment with Dr.Jordan 09/28/21 at 10:40 am.Advised to keep appointment as planned.

## 2021-06-22 ENCOUNTER — Other Ambulatory Visit: Payer: Self-pay | Admitting: Cardiology

## 2021-07-07 ENCOUNTER — Telehealth: Payer: Self-pay | Admitting: Cardiology

## 2021-07-07 MED ORDER — ATORVASTATIN CALCIUM 80 MG PO TABS: 80.0000 mg | ORAL_TABLET | Freq: Every day | ORAL | 3 refills | Status: DC

## 2021-07-07 NOTE — Telephone Encounter (Signed)
   Pt requesting to speak with Malachy Mood

## 2021-07-07 NOTE — Telephone Encounter (Signed)
Spoke to patient she stated she needs a 90 refill on Atorvastatin.Refill sent to pharmacy.

## 2021-07-08 ENCOUNTER — Other Ambulatory Visit: Payer: Medicare Other

## 2021-07-13 ENCOUNTER — Other Ambulatory Visit: Payer: Self-pay

## 2021-07-13 ENCOUNTER — Ambulatory Visit (INDEPENDENT_AMBULATORY_CARE_PROVIDER_SITE_OTHER): Payer: Medicare Other

## 2021-07-13 DIAGNOSIS — I482 Chronic atrial fibrillation, unspecified: Secondary | ICD-10-CM

## 2021-07-13 DIAGNOSIS — Z5181 Encounter for therapeutic drug level monitoring: Secondary | ICD-10-CM

## 2021-07-13 DIAGNOSIS — I4891 Unspecified atrial fibrillation: Secondary | ICD-10-CM

## 2021-07-13 DIAGNOSIS — Z7901 Long term (current) use of anticoagulants: Secondary | ICD-10-CM | POA: Diagnosis not present

## 2021-07-13 LAB — POCT INR: INR: 3.5 — AB (ref 2.0–3.0)

## 2021-07-13 NOTE — Patient Instructions (Signed)
Hold tonight only and then decrease to 1/2 tablet daily.  Repeat INR in 4 weeks

## 2021-07-26 DIAGNOSIS — Z23 Encounter for immunization: Secondary | ICD-10-CM | POA: Diagnosis not present

## 2021-08-04 DIAGNOSIS — Z79891 Long term (current) use of opiate analgesic: Secondary | ICD-10-CM | POA: Diagnosis not present

## 2021-08-10 ENCOUNTER — Other Ambulatory Visit: Payer: Self-pay

## 2021-08-10 ENCOUNTER — Ambulatory Visit (INDEPENDENT_AMBULATORY_CARE_PROVIDER_SITE_OTHER): Payer: Medicare Other

## 2021-08-10 DIAGNOSIS — I4891 Unspecified atrial fibrillation: Secondary | ICD-10-CM

## 2021-08-10 DIAGNOSIS — Z5181 Encounter for therapeutic drug level monitoring: Secondary | ICD-10-CM | POA: Diagnosis not present

## 2021-08-10 DIAGNOSIS — I482 Chronic atrial fibrillation, unspecified: Secondary | ICD-10-CM | POA: Diagnosis not present

## 2021-08-10 DIAGNOSIS — Z7901 Long term (current) use of anticoagulants: Secondary | ICD-10-CM | POA: Diagnosis not present

## 2021-08-10 LAB — POCT INR: INR: 2.8 (ref 2.0–3.0)

## 2021-08-10 NOTE — Patient Instructions (Signed)
Description   Continue taking 1/2 tablet daily. Repeat INR in 5 weeks

## 2021-08-13 DIAGNOSIS — Z23 Encounter for immunization: Secondary | ICD-10-CM | POA: Diagnosis not present

## 2021-09-14 ENCOUNTER — Ambulatory Visit (INDEPENDENT_AMBULATORY_CARE_PROVIDER_SITE_OTHER): Payer: Medicare Other

## 2021-09-14 ENCOUNTER — Other Ambulatory Visit: Payer: Self-pay

## 2021-09-14 ENCOUNTER — Telehealth: Payer: Self-pay

## 2021-09-14 ENCOUNTER — Other Ambulatory Visit: Payer: Self-pay | Admitting: Legal Medicine

## 2021-09-14 DIAGNOSIS — I482 Chronic atrial fibrillation, unspecified: Secondary | ICD-10-CM | POA: Diagnosis not present

## 2021-09-14 DIAGNOSIS — Z7901 Long term (current) use of anticoagulants: Secondary | ICD-10-CM | POA: Diagnosis not present

## 2021-09-14 DIAGNOSIS — I4891 Unspecified atrial fibrillation: Secondary | ICD-10-CM | POA: Diagnosis not present

## 2021-09-14 DIAGNOSIS — Z5181 Encounter for therapeutic drug level monitoring: Secondary | ICD-10-CM

## 2021-09-14 LAB — POCT INR: INR: 3.5 — AB (ref 2.0–3.0)

## 2021-09-14 MED ORDER — CYANOCOBALAMIN 1000 MCG/ML IJ SOLN: 1000.0000 ug | INTRAMUSCULAR | 3 refills | Status: DC

## 2021-09-14 NOTE — Telephone Encounter (Signed)
I called in injectable B12, bring bottle here and we will give injection once a month lp

## 2021-09-14 NOTE — Telephone Encounter (Signed)
Patient called and stated that she would like to start b12 injection instead of OTC. Please advise

## 2021-09-14 NOTE — Telephone Encounter (Signed)
Unable to call her.

## 2021-09-14 NOTE — Patient Instructions (Signed)
Description   Hold today's dose and then continue taking 1/2 tablet daily. Repeat INR in 2 weeks.

## 2021-09-26 NOTE — Progress Notes (Deleted)
[name] Date of Birth: 08-07-1932   History of Present Illness: [name] is seen for follow up Afib. She has a history of chronic atrial fibrillation, hypertensive heart disease, and pulmonary hypertension. She has a history of chronic back problems. She has moderate MR and COPD. In October 2019 she developed progressive weakness in her legs. We held her lipitor without improvement. MRI was done. This showed some chronic fatty atrophy of the muscles c/w denervation in the peroneal nerve distribution. We had discussed switching to a DOAC but she could not afford it. INR is followed in [name].  She is followed by podiatry for evaluation of a left foot ulcer. LE arterial dopplers done as noted below. She was seen by [name] and underwent angiography in June 2021. This  showed no significant aortoiliac disease.  There was mild to moderate left SFA and popliteal disease with one-vessel runoff below the knee via the peroneal artery which was free of significant disease.  There was mild disease in the TP trunk.  The anterior tibial and posterior tibial arteries were both occluded with reconstitution distally via the communicating branches of the peroneal artery with intact pedal arch.  He attempted revascularization of the anterior tibial artery to give her more blood flow but was not able to cross the occlusion antegrade given the length of the occlusion. Medical management recommended.   On follow up today she reports her ulcer is unchanged. It does hurt. Denies any chest pain. Breathing is a little worse with the hot weather. Is going to resume using Spiriva which has helped in the past. Feels swollen and thought her weight was up significantly but our scales show she has lost weight.     Current Outpatient Medications on File Prior to Visit  Medication Sig Dispense Refill   ascorbic acid (VITAMIN C) 500 MG tablet Take 600 mg by mouth daily.      atorvastatin (LIPITOR) 80 MG tablet  Take 1 tablet (80 mg total) by mouth daily. 90 tablet 3   Biotin 1000 MCG tablet Take 1 tablet by mouth daily.      Calcium Carbonate-Vitamin D (CALCIUM + D PO) Take by mouth daily.     Cholecalciferol (VITAMIN D PO) Take by mouth daily.     cyanocobalamin (,VITAMIN B-12,) 1000 MCG/ML injection Inject 1 mL (1,000 mcg total) into the muscle every 30 (thirty) days. 10 mL 3   diltiazem (TIAZAC) 120 MG 24 hr capsule Take 1 capsule (120 mg total) by mouth daily. 90 capsule 3   folic acid (FOLVITE) 1 MG tablet Take 1 mg by mouth daily.     furosemide (LASIX) 40 MG tablet Take 0.5 tablets (20 mg total) by mouth as needed for fluid or edema (shortness of breath). 45 tablet 8   gabapentin (NEURONTIN) 300 MG capsule Take 600 mg by mouth at bedtime.      HYDROcodone-acetaminophen (NORCO) 10-325 MG per tablet Take 1 tablet by mouth every 6 (six) hours as needed for moderate pain.      lisinopril (ZESTRIL) 20 MG tablet Take 1 tablet (20 mg total) by mouth daily. 90 tablet 1   oxybutynin (DITROPAN) 5 MG tablet Take 1 tablet (5 mg total) by mouth 3 (three) times daily. 90 tablet 2   pantoprazole (PROTONIX) 40 MG tablet Take 40 mg by mouth daily.     potassium chloride SA (KLOR-CON) 20 MEQ tablet TAKE ONE TABLET BY MOUTH TWICE DAILY 180 tablet 3   sertraline (ZOLOFT) 50  MG tablet Take 75 mg by mouth daily.      Tiotropium Bromide Monohydrate (SPIRIVA RESPIMAT) 2.5 MCG/ACT AERS Inhale 2 puffs into the lungs daily. 1 g 6   valACYclovir (VALTREX) 500 MG tablet Take 500 mg by mouth 2 (two) times daily.     vitamin A 8000 UNIT capsule Take 8,000 Units by mouth daily.     warfarin (COUMADIN) 5 MG tablet Take 0.5-1 tablets (2.5-5 mg total) by mouth See admin instructions. 5 mg on Mondays and 2.5 mg all other days 48 tablet 1   No current facility-administered medications on file prior to visit.    Allergies  Allergen Reactions   Sulfanilamide     unkn   Penicillins Rash    Past Medical History:  Diagnosis  Date   Arthritis    Back pain, chronic    Coronary artery disease CARDIOLOGIST- DR [name]--  LOV IN EPIC   Depression    Hammer toe    Hypercholesterolemia    Hypertensive heart disease    WITH LVH   LVH (left ventricular hypertrophy) due to hypertensive disease    Mitral insufficiency    CHRONIC   Pulmonary hypertension ([place])    Transfusion history     Past Surgical History:  Procedure Laterality Date   ABDOMINAL AORTOGRAM W/LOWER EXTREMITY N/A 04/29/2020   Procedure: ABDOMINAL AORTOGRAM W/LOWER EXTREMITY;  Surgeon: [name], MD;  Location: [place] [facility];  Service: Cardiovascular;  Laterality: N/A;  Lt Leg   ABDOMINAL HYSTERECTOMY     ANTERIOR REPAIR/ [place] PUBOVAGINAL SLING  05-03-2002   BUNIONECTOMY WITH HAMMERTOE RECONSTRUCTION Right 03/01/2013   Procedure: RIGHT FOOT EXCISION BUNIONETTE AND FUSION OF DIP FOURTH TOE;  Surgeon: [name], MD;  Location: [place];  Service: Orthopedics;  Laterality: Right;   CARDIAC CATHETERIZATION  02-03-2009  DR  [name]   SINGLE-VESSEL OBSTRUCTIVE CAD, SMALL DIAGONAL BRANCH/ NORMAL LVF/ SEVERE MITRAL INSUFFICIENCY/ SEVERE PULMONARY HYPERTENSION   CATARACT EXTRACTION W/ INTRAOCULAR LENS  IMPLANT, BILATERAL  2003   COLONOSCOPY  07/25/2014   few mall scattered diverticula in the ectire examined colon. Small internal hemmorhoids.    HAMMER TOE SURGERY  02/28/2012   Procedure: HAMMER TOE CORRECTION;  Surgeon: [name], MD;  Location: [facility];  Service: Orthopedics;  Laterality: Right;  FUSION OF PROXIMAL INTERPHALANGEAL  RIGHT THIRD CLAW TOE   PARS PLANA VITRECTOMY W/ REPAIR OF MACULAR HOLE  02-04-2002   LEFT EYE   PERIPHERAL VASCULAR BALLOON ANGIOPLASTY  04/29/2020   Procedure: PERIPHERAL VASCULAR BALLOON ANGIOPLASTY;  Surgeon: [name], MD;  Location: [place] [facility];  Service: Cardiovascular;;  Lt. AT   REPAIR RIGHT SECOND CLAW TOE/ VARUS MEDIAL CLOSING WEDGE OSTEOTOMY  PROXIMAL PHALANX RIGHT GREAT TOE  05-05-2005   TOTAL HIP ARTHROPLASTY     BILATERAL---  LEFT 4/98;   RIGHT  11/98   VARUS OSTEOTOMY PROXIMAL PHALANX, LEFT GREAT TOE/ PARING DOWN THE CALLUS , SECOND LEFT TOE  10-27-2008    Social History   Tobacco Use  Smoking Status Never  Smokeless Tobacco Never    Social History   Substance and Sexual Activity  Alcohol Use Not Currently   Comment: occasional    Family History  Problem Relation Age of Onset   Heart disease Mother 26   Prostate cancer Father 56    Review of Systems:  As noted in history of present illness.  All other systems were reviewed and are negative.  Physical  Exam: There were no vitals taken for this visit. GENERAL:  Well appearing WF in NAD HEENT:  PERRL, EOMI, sclera are clear. Oropharynx is clear. NECK:  No jugular venous distention, carotid upstroke brisk and symmetric, no bruits, no thyromegaly or adenopathy LUNGS:  Clear to auscultation bilaterally CHEST:  Unremarkable HEART:  RRR,  PMI not displaced or sustained,S1 and S2 within normal limits, no S3, no S4: no clicks, no rubs, gr 2/6 systolic murmur apex.  ABD:  Soft, nontender. BS +, no masses or bruits. No hepatomegaly, no splenomegaly EXT: poor pedal pulses, no edema, venous varicosities. There a a calloused area over the lateral metatarsal joint on the left with ulceration. No redness or drainage.  SKIN:  Warm and dry.  No rashes NEURO:  Alert and oriented x 3. Cranial nerves II through XII intact. PSYCH:  Cognitively intact     LABORATORY DATA:  Lab Results  Component Value Date   WBC 5.5 05/19/2021   HGB 14.2 05/19/2021   HCT 44.1 05/19/2021   PLT 245 05/19/2021   GLUCOSE 115 (H) 05/19/2021   CHOL 165 05/19/2021   TRIG 109 05/19/2021   HDL 56 05/19/2021   LDLCALC 89 05/19/2021   ALT 38 (H) 05/19/2021   AST 40 05/19/2021   NA 145 (H) 05/19/2021   K 4.7 05/19/2021   CL 106 05/19/2021   CREATININE 0.92 05/19/2021   BUN 19 05/19/2021    CO2 22 05/19/2021   TSH 2.340 03/26/2020   INR 3.5 (A) 09/14/2021   HGBA1C 6.6 (H) 05/19/2021   MICROALBUR 30 05/13/2020    Labs reviewed from 02/24/17: cholesterol 174, triglycerides 110, LDL 90, HDL 62. A1c 6.0%. CMET and TSH normal. Dated 08/24/17: cholesterol 157, triglycerides 106, HDL 55, LDL 90. A1c 6.1%. CMET and CBC normal  Ecg today shows AFib with rate 103. Low voltage. Nonspecific ST-T abnormality. RAD. I have personally reviewed and interpreted this study.   LE arterial dopplers 03/06/20:  Summary:  Left: Moderate progression is noted compared to previous study.   Duplex imaging showed heterogenous plaque in the CFA, DFA, SFA, popliteal  artery and TPT. Medial calcifications noted in the tibial vessels.  50-74% stenosis in the TPT, based on velocity ratio.  Probable one vessel run-off via peroneal artery.  Total occlusion noted in the PTA.  Occluded proximal to mid ATA.   LE ABIs 03/06/20: Right ABIs appear increased compared to prior study on 02/21/2013. Left  ABIs appear essentially unchanged compared to prior study on 02/21/2013.  Left ABIs are unreliable; arterial wall calcification precludes accurate  ankle pressure and ABIs. Right TBIs  are essentially unchanged; left TBIs have decreased.  Summary:  Right: Resting right ankle-brachial index indicates noncompressible right  lower extremity arteries. The right toe-brachial index is normal.   Left: The left toe-brachial index is abnormal. ABIs are unreliable.  Arterial wall calcification precludes accurate ankle pressure and ABIs.      Assessment / Plan: 1. Atrial fibrillation, permanent. Rate is  controlled on diltiazem. She is anticoagulated on Coumadin. INR has been therapeutic.  2. Mild to Moderate MR. Last Echo 8/16. Asymptomatic. Exam is unchanged.   3. Hypertension with hypertensive heart disease, BP is controlled.  4. Hyperlipidemia on statin therapy. Last LDL 86   On lipitor at 80 mg daily.  Encourage  healthy eating.   5. Pulmonary hypertension-moderate  6. Coronary disease. Cardiac catheterization in 2010 showed severe stenosis in a small diagonal branch. She otherwise had no significant disease. She is  asymptomatic.   7. Chronic bronchitis and COPD. Chronic  dyspnea. Will resume Spiriva.   8. PAD - has single vessel run off below the knee. Has nonhealing ulcer on the left. Unable to cross occlusion percutaneously. Continue medical management. Ulcer is stable.   Follow up in 6 months.

## 2021-09-28 ENCOUNTER — Ambulatory Visit: Payer: Medicare Other | Admitting: Cardiology

## 2021-09-28 ENCOUNTER — Telehealth: Payer: Self-pay | Admitting: Cardiology

## 2021-09-28 NOTE — Telephone Encounter (Signed)
Spoke to patient appointment scheduled with Dr.Jordan 12/6 at 11:00 am.

## 2021-09-28 NOTE — Telephone Encounter (Signed)
Patient is requesting to speak with Malachy Mood, Dr. Doug Sou nurse. She states it is urgent, but she did not go into detail. She states she will discuss further when she speaks with the nurse. She also cancelled her appointment with Dr. [name] and her coumadin this morning due to have diarrhea. Please return call when able.

## 2021-09-28 NOTE — Telephone Encounter (Signed)
Spoke with pt, she had to cancel her appointment today with dr [name] because she is having diarrhea. She reports she gets diarrhea after eating greens. She wanted to let dr [name] and cheryl know. Aware will forward to cheryl to call her back when she can.

## 2021-10-06 ENCOUNTER — Telehealth: Payer: Self-pay

## 2021-10-06 NOTE — Telephone Encounter (Signed)
INR overdue. Called pt and pt's daughter, no answer. LMOM

## 2021-10-10 NOTE — Progress Notes (Signed)
[name] Date of Birth: October 29, 1932   History of Present Illness: [name] is seen for follow up Afib. She has a history of chronic atrial fibrillation, hypertensive heart disease, and pulmonary hypertension. She has a history of chronic back problems. She has moderate MR and COPD. In October 2019 she developed progressive weakness in her legs. We held her lipitor without improvement. MRI was done. This showed some chronic fatty atrophy of the muscles c/w denervation in the peroneal nerve distribution. We had discussed switching to a DOAC but she could not afford it. INR is followed in [place].  She is followed by podiatry for evaluation of a left foot ulcer. LE arterial dopplers done as noted below. She was seen by [name] and underwent angiography in June 2021. This  showed no significant aortoiliac disease.  There was mild to moderate left SFA and popliteal disease with one-vessel runoff below the knee via the peroneal artery which was free of significant disease.  There was mild disease in the TP trunk.  The anterior tibial and posterior tibial arteries were both occluded with reconstitution distally via the communicating branches of the peroneal artery with intact pedal arch.  He attempted revascularization of the anterior tibial artery to give her more blood flow but was not able to cross the occlusion antegrade given the length of the occlusion. Medical management recommended.   On follow up today she is doing OK. Denies any chest pain. Breathing is an issue but thinks inhaler is helping. No palpitations or swelling. No new foot ulcers. Coumadin therapeutic and no bleeding.     Current Outpatient Medications on File Prior to Visit  Medication Sig Dispense Refill   ascorbic acid (VITAMIN C) 500 MG tablet Take 600 mg by mouth daily.      atorvastatin (LIPITOR) 80 MG tablet Take 1 tablet (80 mg total) by mouth daily. 90 tablet 3   Biotin 1000 MCG tablet Take 1 tablet by mouth  daily.      Calcium Carbonate-Vitamin D (CALCIUM + D PO) Take by mouth daily.     Cholecalciferol (VITAMIN D PO) Take by mouth daily.     cyanocobalamin (,VITAMIN B-12,) 1000 MCG/ML injection Inject 1 mL (1,000 mcg total) into the muscle every 30 (thirty) days. 10 mL 3   diltiazem (TIAZAC) 120 MG 24 hr capsule Take 1 capsule (120 mg total) by mouth daily. 90 capsule 3   folic acid (FOLVITE) 1 MG tablet Take 1 mg by mouth daily.     furosemide (LASIX) 40 MG tablet Take 0.5 tablets (20 mg total) by mouth as needed for fluid or edema (shortness of breath). 45 tablet 8   gabapentin (NEURONTIN) 300 MG capsule Take 600 mg by mouth at bedtime.      HYDROcodone-acetaminophen (NORCO) 10-325 MG per tablet Take 1 tablet by mouth every 6 (six) hours as needed for moderate pain.      lisinopril (ZESTRIL) 20 MG tablet Take 1 tablet (20 mg total) by mouth daily. 90 tablet 1   oxybutynin (DITROPAN) 5 MG tablet Take 1 tablet (5 mg total) by mouth 3 (three) times daily. 90 tablet 2   pantoprazole (PROTONIX) 40 MG tablet Take 40 mg by mouth daily.     potassium chloride SA (KLOR-CON) 20 MEQ tablet TAKE ONE TABLET BY MOUTH TWICE DAILY 180 tablet 3   sertraline (ZOLOFT) 50 MG tablet Take 75 mg by mouth daily.      Tiotropium Bromide Monohydrate (SPIRIVA RESPIMAT) 2.5 MCG/ACT AERS  Inhale 2 puffs into the lungs daily. 1 g 6   valACYclovir (VALTREX) 500 MG tablet Take 500 mg by mouth 2 (two) times daily.     vitamin A 8000 UNIT capsule Take 8,000 Units by mouth daily.     warfarin (COUMADIN) 5 MG tablet Take 0.5-1 tablets (2.5-5 mg total) by mouth See admin instructions. 5 mg on Mondays and 2.5 mg all other days 48 tablet 1   No current facility-administered medications on file prior to visit.    Allergies  Allergen Reactions   Sulfanilamide     unkn   Penicillins Rash    Past Medical History:  Diagnosis Date   Arthritis    Back pain, chronic    Coronary artery disease CARDIOLOGIST- DR [name]--  LOV IN EPIC    Depression    Hammer toe    Hypercholesterolemia    Hypertensive heart disease    WITH LVH   LVH (left ventricular hypertrophy) due to hypertensive disease    Mitral insufficiency    CHRONIC   Pulmonary hypertension ([place])    Transfusion history     Past Surgical History:  Procedure Laterality Date   ABDOMINAL AORTOGRAM W/LOWER EXTREMITY N/A 04/29/2020   Procedure: ABDOMINAL AORTOGRAM W/LOWER EXTREMITY;  Surgeon: [name], MD;  Location: [place] [facility];  Service: Cardiovascular;  Laterality: N/A;  Lt Leg   ABDOMINAL HYSTERECTOMY     ANTERIOR REPAIR/ [place] PUBOVAGINAL SLING  05-03-2002   BUNIONECTOMY WITH HAMMERTOE RECONSTRUCTION Right 03/01/2013   Procedure: RIGHT FOOT EXCISION BUNIONETTE AND FUSION OF DIP FOURTH TOE;  Surgeon: [name], MD;  Location: [place];  Service: Orthopedics;  Laterality: Right;   CARDIAC CATHETERIZATION  02-03-2009  DR  [name]   SINGLE-VESSEL OBSTRUCTIVE CAD, SMALL DIAGONAL BRANCH/ NORMAL LVF/ SEVERE MITRAL INSUFFICIENCY/ SEVERE PULMONARY HYPERTENSION   CATARACT EXTRACTION W/ INTRAOCULAR LENS  IMPLANT, BILATERAL  2003   COLONOSCOPY  07/25/2014   few mall scattered diverticula in the ectire examined colon. Small internal hemmorhoids.    HAMMER TOE SURGERY  02/28/2012   Procedure: HAMMER TOE CORRECTION;  Surgeon: [name], MD;  Location: [facility];  Service: Orthopedics;  Laterality: Right;  FUSION OF PROXIMAL INTERPHALANGEAL  RIGHT THIRD CLAW TOE   PARS PLANA VITRECTOMY W/ REPAIR OF MACULAR HOLE  02-04-2002   LEFT EYE   PERIPHERAL VASCULAR BALLOON ANGIOPLASTY  04/29/2020   Procedure: PERIPHERAL VASCULAR BALLOON ANGIOPLASTY;  Surgeon: [name], MD;  Location: [place] [facility];  Service: Cardiovascular;;  Lt. AT   REPAIR RIGHT SECOND CLAW TOE/ VARUS MEDIAL CLOSING WEDGE OSTEOTOMY PROXIMAL PHALANX RIGHT GREAT TOE  05-05-2005   TOTAL HIP ARTHROPLASTY     BILATERAL---  LEFT 4/98;   RIGHT   11/98   VARUS OSTEOTOMY PROXIMAL PHALANX, LEFT GREAT TOE/ PARING DOWN THE CALLUS , SECOND LEFT TOE  10-27-2008    Social History   Tobacco Use  Smoking Status Never  Smokeless Tobacco Never    Social History   Substance and Sexual Activity  Alcohol Use Not Currently   Comment: occasional    Family History  Problem Relation Age of Onset   Heart disease Mother 23   Prostate cancer Father 66    Review of Systems:  As noted in history of present illness.  All other systems were reviewed and are negative.  Physical Exam: BP (!) 142/80   Pulse 74   Ht 5\' 4"  (1.626 m)   Wt 167 lb 3.2  oz (75.8 kg)   SpO2 99%   BMI 28.70 kg/m  GENERAL:  Well appearing WF in NAD HEENT:  PERRL, EOMI, sclera are clear. Oropharynx is clear. NECK:  No jugular venous distention, carotid upstroke brisk and symmetric, no bruits, no thyromegaly or adenopathy LUNGS:  Clear to auscultation bilaterally CHEST:  Unremarkable HEART:  RRR,  PMI not displaced or sustained,S1 and S2 within normal limits, no S3, no S4: no clicks, no rubs, gr 2/6 systolic murmur apex.  ABD:  Soft, nontender. BS +, no masses or bruits. No hepatomegaly, no splenomegaly EXT: poor pedal pulses, no edema, venous varicosities. There a a calloused area over the lateral metatarsal joint on the left with ulceration. No redness or drainage.  SKIN:  Warm and dry.  No rashes NEURO:  Alert and oriented x 3. Cranial nerves II through XII intact. PSYCH:  Cognitively intact     LABORATORY DATA:  Lab Results  Component Value Date   WBC 5.5 05/19/2021   HGB 14.2 05/19/2021   HCT 44.1 05/19/2021   PLT 245 05/19/2021   GLUCOSE 115 (H) 05/19/2021   CHOL 165 05/19/2021   TRIG 109 05/19/2021   HDL 56 05/19/2021   LDLCALC 89 05/19/2021   ALT 38 (H) 05/19/2021   AST 40 05/19/2021   NA 145 (H) 05/19/2021   K 4.7 05/19/2021   CL 106 05/19/2021   CREATININE 0.92 05/19/2021   BUN 19 05/19/2021   CO2 22 05/19/2021   TSH 2.340 03/26/2020    INR 2.1 10/12/2021   HGBA1C 6.6 (H) 05/19/2021   MICROALBUR 30 05/13/2020    Labs reviewed from 02/24/17: cholesterol 174, triglycerides 110, LDL 90, HDL 62. A1c 6.0%. CMET and TSH normal. Dated 08/24/17: cholesterol 157, triglycerides 106, HDL 55, LDL 90. A1c 6.1%. CMET and CBC normal    LE arterial dopplers 03/06/20:  Summary:  Left: Moderate progression is noted compared to previous study.   Duplex imaging showed heterogenous plaque in the CFA, DFA, SFA, popliteal  artery and TPT. Medial calcifications noted in the tibial vessels.  50-74% stenosis in the TPT, based on velocity ratio.  Probable one vessel run-off via peroneal artery.  Total occlusion noted in the PTA.  Occluded proximal to mid ATA.   LE ABIs 03/06/20: Right ABIs appear increased compared to prior study on 02/21/2013. Left  ABIs appear essentially unchanged compared to prior study on 02/21/2013.  Left ABIs are unreliable; arterial wall calcification precludes accurate  ankle pressure and ABIs. Right TBIs  are essentially unchanged; left TBIs have decreased.  Summary:  Right: Resting right ankle-brachial index indicates noncompressible right  lower extremity arteries. The right toe-brachial index is normal.   Left: The left toe-brachial index is abnormal. ABIs are unreliable.  Arterial wall calcification precludes accurate ankle pressure and ABIs.      Assessment / Plan: 1. Atrial fibrillation, permanent. Rate is  controlled on diltiazem. She is anticoagulated on Coumadin. INR 2.1 today  2. Mild to Moderate MR. Last Echo 8/16. Asymptomatic. Exam is unchanged.   3. Hypertension with hypertensive heart disease, BP is controlled.  4. Hyperlipidemia on statin therapy. Last LDL 86   On lipitor at 80 mg daily.  Encourage healthy eating.   5. Pulmonary hypertension-moderate  6. Coronary disease. Cardiac catheterization in 2010 showed severe stenosis in a small diagonal branch. She otherwise had no significant  disease. She is asymptomatic.   7. Chronic bronchitis and COPD. Chronic  dyspnea. Will continue Spiriva.   8. PAD -  has single vessel run off below the knee. Has nonhealing ulcer on the left. Unable to cross occlusion percutaneously. Continue medical management. Ulcer is stable.   Follow up in 6 months with lab work.

## 2021-10-12 ENCOUNTER — Ambulatory Visit (INDEPENDENT_AMBULATORY_CARE_PROVIDER_SITE_OTHER): Payer: Medicare Other | Admitting: Cardiology

## 2021-10-12 ENCOUNTER — Other Ambulatory Visit: Payer: Self-pay

## 2021-10-12 ENCOUNTER — Encounter: Payer: Self-pay | Admitting: Cardiology

## 2021-10-12 ENCOUNTER — Ambulatory Visit (INDEPENDENT_AMBULATORY_CARE_PROVIDER_SITE_OTHER): Payer: Medicare Other | Admitting: *Deleted

## 2021-10-12 VITALS — BP 142/80 | HR 74 | Ht 64.0 in | Wt 167.2 lb

## 2021-10-12 DIAGNOSIS — Z5181 Encounter for therapeutic drug level monitoring: Secondary | ICD-10-CM | POA: Diagnosis not present

## 2021-10-12 DIAGNOSIS — I482 Chronic atrial fibrillation, unspecified: Secondary | ICD-10-CM | POA: Diagnosis not present

## 2021-10-12 DIAGNOSIS — I10 Essential (primary) hypertension: Secondary | ICD-10-CM

## 2021-10-12 DIAGNOSIS — I739 Peripheral vascular disease, unspecified: Secondary | ICD-10-CM

## 2021-10-12 DIAGNOSIS — Z7901 Long term (current) use of anticoagulants: Secondary | ICD-10-CM | POA: Diagnosis not present

## 2021-10-12 DIAGNOSIS — I4891 Unspecified atrial fibrillation: Secondary | ICD-10-CM

## 2021-10-12 DIAGNOSIS — J410 Simple chronic bronchitis: Secondary | ICD-10-CM | POA: Diagnosis not present

## 2021-10-12 LAB — POCT INR: INR: 2.1 (ref 2.0–3.0)

## 2021-10-12 NOTE — Addendum Note (Signed)
Addended by: Kathyrn Lass on: 10/12/2021 11:04 AM   Modules accepted: Orders

## 2021-10-12 NOTE — Patient Instructions (Signed)
Description   Continue taking warfarin 1/2 tablet daily. Repeat INR in 4 weeks.

## 2021-10-21 ENCOUNTER — Telehealth: Payer: Self-pay | Admitting: Legal Medicine

## 2021-10-21 NOTE — Chronic Care Management (AMB) (Signed)
°  Chronic Care Management   Outreach Note  10/21/2021 Name: [name] MRN: [id number] DOB: [birth year]  Referred by: [name], MD Reason for referral : No chief complaint on file.   An unsuccessful telephone outreach was attempted today. The patient was referred to the pharmacist for assistance with care management and care coordination.   Follow Up Plan:   Tatjana Dellinger Upstream Scheduler

## 2021-10-26 ENCOUNTER — Other Ambulatory Visit: Payer: Self-pay | Admitting: Legal Medicine

## 2021-10-26 ENCOUNTER — Telehealth: Payer: Self-pay | Admitting: Legal Medicine

## 2021-10-26 NOTE — Progress Notes (Signed)
°  Chronic Care Management   Note  10/26/2021 Name: Debanhi Blaker MRN: [id number] DOB: [birth year]  Ellaree Gear is a 87 y.o. year old female who is a primary care patient of [name], MD. I reached out to [name] by phone today in response to a referral sent by Ms. Bunnie Borman's PCP, [name], MD.   Ms. Pincock was given information about Chronic Care Management services today including:  CCM service includes personalized support from designated clinical staff supervised by her physician, including individualized plan of care and coordination with other care providers 24/7 contact phone numbers for assistance for urgent and routine care needs. Service will only be billed when office clinical staff spend 20 minutes or more in a month to coordinate care. Only one practitioner may furnish and bill the service in a calendar month. The patient may stop CCM services at any time (effective at the end of the month) by phone call to the office staff.   Patient agreed to services and verbal consent obtained.   Follow up plan:   Tatjana Secretary/administrator

## 2021-10-26 NOTE — Chronic Care Management (AMB) (Signed)
°  Chronic Care Management   Outreach Note  10/26/2021 Name: [name] MRN: [id number] DOB: 1932/06/26  Referred by: [name], MD Reason for referral : No chief complaint on file.   A second unsuccessful telephone outreach was attempted today. The patient was referred to pharmacist for assistance with care management and care coordination.  Follow Up Plan:   Tatjana Dellinger Upstream Scheduler

## 2021-11-03 ENCOUNTER — Other Ambulatory Visit: Payer: Self-pay | Admitting: Cardiology

## 2021-11-09 ENCOUNTER — Other Ambulatory Visit: Payer: Self-pay

## 2021-11-09 ENCOUNTER — Ambulatory Visit (INDEPENDENT_AMBULATORY_CARE_PROVIDER_SITE_OTHER): Payer: Medicare Other

## 2021-11-09 DIAGNOSIS — I4891 Unspecified atrial fibrillation: Secondary | ICD-10-CM | POA: Diagnosis not present

## 2021-11-09 DIAGNOSIS — E538 Deficiency of other specified B group vitamins: Secondary | ICD-10-CM

## 2021-11-09 DIAGNOSIS — I482 Chronic atrial fibrillation, unspecified: Secondary | ICD-10-CM | POA: Diagnosis not present

## 2021-11-09 DIAGNOSIS — Z5181 Encounter for therapeutic drug level monitoring: Secondary | ICD-10-CM | POA: Diagnosis not present

## 2021-11-09 DIAGNOSIS — Z7901 Long term (current) use of anticoagulants: Secondary | ICD-10-CM

## 2021-11-09 LAB — POCT INR: INR: 2.9 (ref 2.0–3.0)

## 2021-11-09 MED ORDER — CYANOCOBALAMIN 1000 MCG/ML IJ SOLN
1000.0000 ug | Freq: Once | INTRAMUSCULAR | Status: AC
  Administered 2021-11-09: 1000 ug via INTRAMUSCULAR

## 2021-11-09 NOTE — Patient Instructions (Signed)
Description   Continue taking warfarin 1/2 tablet daily. Repeat INR in 5 weeks.

## 2021-11-09 NOTE — Progress Notes (Signed)
° °  B12 injection given per order, patient tolerated well. Patient is overdue for appointment with Dr Henrene Pastor and labs - appointment made.  Erie Noe, LPN 72:90 AM

## 2021-11-10 ENCOUNTER — Telehealth: Payer: Self-pay

## 2021-11-10 NOTE — Progress Notes (Signed)
Chronic Care Management Pharmacy Assistant   Name: [name]  MRN: [id number] DOB: 08-26-1932   Reason for Encounter: Chart Prep for initial visit with CPP   Conditions to be addressed/monitored: HTN, HLD, Osteopenia, and Osteoarthritis, Incontinence, Peripheral Vascular Disease, Prediabetes, Chronic back pain, COPD, Mitral Insufficiency, Long term use of anticoagulants, A-Fib  Recent office visits:  11/09/21 Jodi Hammock LPN. Seen for B12 Deficiency. Administered B12 1077mcg.   09/14/21 Jodi Meeker MD. Orders Only. D/C Vitamin B12 PO and started Vitamin B12 1070mcg Injection every 30 days.   05/19/21 Jodi Meeker MD. Seen for Urinary Incontinence. Started Oxybutynin Chloride 5 mg 3 times daily.   Recent consult visits:  11/09/21 (Cardiology) Jodi Abu RN. Anti-Coag Visit. Continue taking warfarin 1/2 tablet daily. Repeat INR in 5 weeks  10/12/21 (Cardiology) [name], Peter MD. Seen for A-Fib and SOB. No med changes.   10/12/21 (Cardiology) Jodi Shock RN. Anti-Coag Visit. Continue taking warfarin 1/2 tablet daily. Repeat INR in 4 weeks  09/14/21 (Cardiology) Jodi Abu RN. Anti-Coag Visit. Hold today's dose and then continue taking 1/2 tablet daily. Repeat INR in 2 weeks.  08/10/21 (Cardiology) Jodi Abu RN. Anti-Coag Visit. Continue taking 1/2 tablet daily. Repeat INR in 5 weeks  07/13/21 (Cardiology) Jodi Schmidt RN. Anti-Coag Visit. Hold tonight only and then decrease to 1/2 tablet daily.  Repeat INR in 4 weeks  05/18/21 (Cardiology) Jodi Schmidt RN. Anti-Coag Visit. Continue taking 1/2 tablet daily except 1 tablet on Mondays.  Repeat INR in 8 weeks  Hospital visits:  None in previous 6 months  Medications: Outpatient Encounter Medications as of 11/10/2021  Medication Sig   ascorbic acid (VITAMIN C) 500 MG tablet Take 600 mg by mouth daily.    atorvastatin (LIPITOR) 80 MG tablet Take 1 tablet (80 mg total) by mouth daily.   Biotin 1000 MCG  tablet Take 1 tablet by mouth daily.    Calcium Carbonate-Vitamin D (CALCIUM + D PO) Take by mouth daily.   Cholecalciferol (VITAMIN D PO) Take by mouth daily.   cyanocobalamin (,VITAMIN B-12,) 1000 MCG/ML injection Inject 1 mL (1,000 mcg total) into the muscle every 30 (thirty) days.   diltiazem (TIAZAC) 120 MG 24 hr capsule TAKE ONE CAPSULE BY MOUTH DAILY   folic acid (FOLVITE) 1 MG tablet Take 1 mg by mouth daily.   furosemide (LASIX) 40 MG tablet Take 0.5 tablets (20 mg total) by mouth as needed for fluid or edema (shortness of breath).   gabapentin (NEURONTIN) 300 MG capsule Take 600 mg by mouth at bedtime.    HYDROcodone-acetaminophen (NORCO) 10-325 MG per tablet Take 1 tablet by mouth every 6 (six) hours as needed for moderate pain.    lisinopril (ZESTRIL) 20 MG tablet Take 1 tablet (20 mg total) by mouth daily.   oxybutynin (DITROPAN) 5 MG tablet Take 1 tablet (5 mg total) by mouth 3 (three) times daily.   pantoprazole (PROTONIX) 40 MG tablet Take 40 mg by mouth daily.   potassium chloride SA (KLOR-CON) 20 MEQ tablet TAKE ONE TABLET BY MOUTH TWICE DAILY   sertraline (ZOLOFT) 50 MG tablet Take 75 mg by mouth daily.    Tiotropium Bromide Monohydrate (SPIRIVA RESPIMAT) 2.5 MCG/ACT AERS Inhale 2 puffs into the lungs daily.   valACYclovir (VALTREX) 500 MG tablet Take 500 mg by mouth 2 (two) times daily.   vitamin A 8000 UNIT capsule Take 8,000 Units by mouth daily.   warfarin (COUMADIN) 5 MG tablet Take 0.5-1 tablets (2.5-5 mg total) by mouth See  admin instructions. 5 mg on Mondays and 2.5 mg all other days   No facility-administered encounter medications on file as of 11/10/2021.    Lab Results  Component Value Date/Time   HGBA1C 6.6 (H) 05/19/2021 11:26 AM   HGBA1C 6.3 (H) 12/08/2020 11:46 AM   MICROALBUR 30 05/13/2020 04:20 PM   MICROALBUR 30 05/08/2019 12:46 PM     BP Readings from Last 3 Encounters:  10/12/21 (!) 142/80  05/19/21 135/80  03/24/21 138/84    Patient Questions:    Have you seen any other providers since your last visit with PCP? No  Any changes in your medications or health? No  Any side effects from any medications? No  Do you have an symptoms or problems not managed by your medications? No  Any concerns about your health right now? No  Has your provider asked that you check blood pressure, blood sugar, or follow special diet at home? No  Do you get any type of exercise on a regular basis? No, pt has problems with balance and her left leg is out of sorts. She stated she use to be a walker but now she can't do a lot. She has to use walker to walk.   Can you think of a goal you would like to reach for your health? Yes, she wants to get out and walk like she use to.   Do you have any problems getting your medications? No  Is there anything that you would like to discuss during the appointment? No   Kaleya Douse was reminded to have all medications, supplements and any blood glucose and blood pressure readings available for review with Arizona Constable, Pharm. D, at her telephone visit on 11/16/21 at 10:30am .    Star Rating Drugs:  Medication:  Last Fill: Day Supply Lisinopril   08/12/21 90ds    05/26/21 90ds Atorvastatin   10/12/21 30ds    09/13/21 30ds  Care Gaps: Last annual wellness visit?None noted  Mammogram: 02/01/21 Dexa Scan: 07/08/19 Last eye exam / retinopathy screening?12/23/20 Last diabetic foot exam?None noted   [name], [place] Pharmacist Assistant  [phone]

## 2021-11-12 ENCOUNTER — Telehealth: Payer: Self-pay

## 2021-11-12 NOTE — Progress Notes (Signed)
11/12/21- Called pt to remind of appt with CPP on 11/16/21 but pt did not answer or have VM come up.   Elray Mcgregor, [place] Pharmacist Assistant  [phone]

## 2021-11-16 ENCOUNTER — Other Ambulatory Visit: Payer: Self-pay

## 2021-11-16 ENCOUNTER — Ambulatory Visit (INDEPENDENT_AMBULATORY_CARE_PROVIDER_SITE_OTHER): Payer: Medicare Other

## 2021-11-16 DIAGNOSIS — I739 Peripheral vascular disease, unspecified: Secondary | ICD-10-CM

## 2021-11-16 DIAGNOSIS — I10 Essential (primary) hypertension: Secondary | ICD-10-CM

## 2021-11-16 DIAGNOSIS — I4811 Longstanding persistent atrial fibrillation: Secondary | ICD-10-CM

## 2021-11-16 DIAGNOSIS — R7303 Prediabetes: Secondary | ICD-10-CM

## 2021-11-16 DIAGNOSIS — I272 Pulmonary hypertension, unspecified: Secondary | ICD-10-CM

## 2021-11-16 NOTE — Patient Instructions (Signed)
Visit Information   Goals Addressed             This Visit's Progress    Manage My Medicine       Timeframe:  Long-Range Goal Priority:  High Start Date:                             Expected End Date:                       Follow Up Date 11/16/22   - use an alarm clock or phone to remind me to take my medicine    Why is this important?   These steps will help you keep on track with your medicines.   Notes:      Track and Manage Heart Rate and Rhythm-Atrial Fibrillation       Timeframe:  Long-Range Goal Priority:  High Start Date:                             Expected End Date:                       Follow Up Date 11/16/22   - take medicine as prescribed    Why is this important?   Atrial fibrillation may have no symptoms. Sometimes the symptoms get worse or happen more often.  It is important to keep track of what your symptoms are and when they happen.  A change in symptoms is important to discuss with your doctor or nurse.  Being active and healthy eating will also help you manage your heart condition.     Notes:        Patient Care Plan: CCM Pharmacy Care Plan     Problem Identified: COPD, Pain, AFib, Lipids   Priority: High  Onset Date: 11/16/2021     Long-Range Goal: Disease State Management   Start Date: 11/16/2021  Expected End Date: 11/16/2022  This Visit's Progress: On track  Priority: High  Note:   Current Barriers:  Does not contact provider office for questions/concerns  Pharmacist Clinical Goal(s):  Patient will contact provider office for questions/concerns as evidenced notation of same in electronic health record through collaboration with PharmD and provider.   Interventions: 1:1 collaboration with [name], MD regarding development and update of comprehensive plan of care as evidenced by provider attestation and co-signature Inter-disciplinary care team collaboration (see longitudinal plan of care) Comprehensive medication  review performed; medication list updated in electronic medical record  Hypertension (BP goal <140/90) BP Readings from Last 3 Encounters:  10/12/21 (!) 142/80  05/19/21 135/80  03/24/21 138/84  -Controlled -Current treatment: Diltiazem 120mg  Appropriate, Effective, Safe, Accessible Lisinopril 20mg  Appropriate, Effective, Safe, Accessible -Medications previously tried: N/A  -Current home readings: Doesn't test -Current dietary habits: "Tries to eat healthy" -Current exercise habits: None (Patient has to use a walker) -Denies hypotensive/hypertensive symptoms -Educated on BP goals and benefits of medications for prevention of heart attack, stroke and kidney damage; -Counseled to monitor BP at home PRN, document, and provide log at future appointments -Recommended to continue current medication  Hyperlipidemia: (LDL goal < 100) The ASCVD Risk score (Arnett DK, et al., 2019) failed to calculate for the following reasons:   The 2019 ASCVD risk score is only valid for ages 21 to 43 Lab Results  Component Value Date   CHOL  165 05/19/2021   CHOL 156 12/08/2020   CHOL 145 08/19/2020   Lab Results  Component Value Date   HDL 56 05/19/2021   HDL 51 12/08/2020   HDL 44 08/19/2020   Lab Results  Component Value Date   LDLCALC 89 05/19/2021   LDLCALC 86 12/08/2020   LDLCALC 81 08/19/2020   Lab Results  Component Value Date   TRIG 109 05/19/2021   TRIG 105 12/08/2020   TRIG 107 08/19/2020   Lab Results  Component Value Date   CHOLHDL 2.9 05/19/2021   CHOLHDL 3.1 12/08/2020   CHOLHDL 3.3 08/19/2020  No results found for: LDLDIRECT -Controlled -Current treatment: Atorvastatin 80mg  -Medications previously tried: N/A  -Current dietary patterns: "Tries to eat healthy" -Current exercise habits: None -Educated on Benefits of statin for ASCVD risk reduction; -Recommended to continue current medication  Atrial Fibrillation (Goal: prevent stroke and major  bleeding) -Controlled -CHADSVASC:   Jan 2023: 5 -Current treatment: Rate control:  Diltiazem 120mg  Appropriate, Effective, Safe, Accessible Lisinopril 20mg  QD Appropriate, Effective, Safe, Accessible Anticoagulation:  Warfarin UD Appropriate, Effective, Safe, Accessible -Medications previously tried: DOAC (Can't afford), Furosemide (Patient stopped due to ADR's) -Home BP and HR readings: Doesn't test  -Counseled on increased risk of stroke due to Afib and benefits of anticoagulation for stroke prevention; Jan 2023: Will let PCP know patient Dc'd Furosemide  COPD (Goal: control symptoms and prevent exacerbations) -Controlled -Current treatment  None -Medications previously tried: Tiotropium  -MMRC/CAT score: No flowsheet data found. -Exacerbations requiring treatment in last 6 months: None -Patient denies consistent use of maintenance inhaler -Frequency of rescue inhaler use: None -Counseled on Benefits of consistent maintenance inhaler use Jan 2023: Patient is adamant she doesn't need an inhaler and didn't wish to speak further about COPD  Depression/Anxiety -Controlled -Current treatment: Sertraline 75mg  Appropriate, Effective, Safe, Accessible -Medications previously tried/failed: None -PHQ9:  Depression screen [facility] 2/9 11/16/2021 05/19/2021 05/13/2020  Decreased Interest 0 0 0  Down, Depressed, Hopeless 0 0 0  PHQ - 2 Score 0 0 0  Altered sleeping - - 0  Tired, decreased energy - - 0  Change in appetite - - 0  Feeling bad or failure about yourself  - - 0  Trouble concentrating - - 0  Moving slowly or fidgety/restless - - 0  Suicidal thoughts - - 0  PHQ-9 Score - - 0  Difficult doing work/chores - - Not difficult at all  Some recent data might be hidden  -GAD7: No flowsheet data found. -Educated on Benefits of medication for symptom control Jan 2023: Patient very very adamant she is not depressed and before I could start the PHQ9 she said, "I'm not depressed and I'm not  down and I don't have depression"  Chronic Pain (Back) -Pain in back since her 30's -Controlled -Current treatment: Hydrocodone/APAP 10/325mg  QID PRN Appropriate, Effective, Safe, Accessible -Medications previously tried: None  -Pain Scale Today's Vitals   11/16/21 1100  PainSc: 9     Aggravating Factors: Movement  Pain Type: Chronic aching Jan 2023: Without Meds: 1-10/10, depends on the day With Meds: 0/10  -Recommended to continue current medication, patient very content. Pain happens occasionally   Chronic Pain (Neuropathy) -Controlled -Current treatment: Gabapentin 600mg  HS Appropriate, Effective, Safe, Accessible -Medications previously tried: None  -Pain Scale Aggravating Factors: None, pain is constant  Pain Type: Burning Jan 2023 Without meds: 5/10 With Meds: 3-4/10  -Recommended to continue current medication  OAB (Goal: Decrease Urination) -Controlled -Current treatment  Oxybutynin 5mg  TID (  Taking QD) Query Appropriate, Query effective, Query Safe, Accessible -Medications previously tried: N/A  Jan 2023: Attempted to talk with patient about this disease state, frequency of urination etc. Patient changed topics. Brought up the idea that Oxybutynin IR TID could be changed for QD so it'll last all day and patient didn't wish to change therapy at this time   Patient Goals/Self-Care Activities Patient will:  - take medications as prescribed as evidenced by patient report and record review  Follow Up Plan: The patient has been provided with contact information for the care management team and has been advised to call with any health related questions or concerns.   CPP F/U July 2023  [place] Constable, Sherian Rein.D. - [phone]      Ms. Mcelhaney was given information about Chronic Care Management services today including:  CCM service includes personalized support from designated clinical staff supervised by her physician, including individualized plan of care  and coordination with other care providers 24/7 contact phone numbers for assistance for urgent and routine care needs. Standard insurance, coinsurance, copays and deductibles apply for chronic care management only during months in which we provide at least 20 minutes of these services. Most insurances cover these services at 100%, however patients may be responsible for any copay, coinsurance and/or deductible if applicable. This service may help you avoid the need for more expensive face-to-face services. Only one practitioner may furnish and bill the service in a calendar month. The patient may stop CCM services at any time (effective at the end of the month) by phone call to the office staff.  Patient agreed to services and verbal consent obtained.   The patient verbalized understanding of instructions, educational materials, and care plan provided today and declined offer to receive copy of patient instructions, educational materials, and care plan.  The pharmacy team will reach out to the patient again over the next 90 days.   Lane Hacker, [facility]

## 2021-11-16 NOTE — Progress Notes (Signed)
Chronic Care Management Pharmacy Note  11/16/2021 Name:  [name] MRN:  [id number] DOB:  September 24, 1932  Summary: -Pleasant 88 year old female presents for initial CCM visit. She was born in [place], near [place]. Her mother/Father divorced when she was 76.14 years old so she moved to [place] with her mom (That's where her mom grew up) and visited her father occasionally. 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 She loves to walk, be outdoors, and go to parks. In the winter she loves to Ski (After she had Total Hip Sx x2, she changed to [employer]) and ice skate. In the summer, she loves going to parks and walking. Now that she is using a walker, she in unable to get outside as much. She moved to [place] to be with her two daughters so they could help her. However, the first daughter fractured her Achilles Tendon and was bed-ridden for 4 months and the other daughter is having walking issues. Patient now spends time watching her favourite sports (Rams #1, [employer] Packers #2, in addition to tennis and golf). She also loves to knit and crochet. She hasn't heard from her son in 62 years. She doesn't like [place] because she dislikes the summers and can't get outside in the winters to have fun  Recommendations/Changes made from today's visit: -Patient stopped Furosemide, will ask PCP how to proceed   Subjective: [name] is an 88 y.o. year old female who is a primary patient of Henrene Pastor, Zeb Comfort, MD.  The CCM team was consulted for assistance with disease management and care coordination needs.    Engaged with patient by telephone for initial visit in response to provider referral for pharmacy case management and/or care coordination services.   Consent to Services:  The patient was given the following information about Chronic Care Management services today, agreed to services, and gave verbal consent: 1. CCM service includes personalized support  from designated clinical staff supervised by the primary care provider, including individualized plan of care and coordination with other care providers 2. 24/7 contact phone numbers for assistance for urgent and routine care needs. 3. Service will only be billed when office clinical staff spend 20 minutes or more in a month to coordinate care. 4. Only one practitioner may furnish and bill the service in a calendar month. 5.The patient may stop CCM services at any time (effective at the end of the month) by phone call to the office staff. 6. The patient will be responsible for cost sharing (co-pay) of up to 20% of the service fee (after annual deductible is met). Patient agreed to services and consent obtained.  Patient Care Team: [name], MD as PCP - General (Family Medicine) Monna Fam, MD as Consulting Physician (Ophthalmology) Lane Hacker, [facility] as Pharmacist (Pharmacist)  Recent office visits:  11/09/21 Rhae Hammock LPN. Seen for B12 Deficiency. Administered B12 1066mg.    09/14/21 PReinaldo MeekerMD. Orders Only. D/C Vitamin B12 PO and started Vitamin B12 10032m Injection every 30 days.    05/19/21 PeReinaldo MeekerD. Seen for Urinary Incontinence. Started Oxybutynin Chloride 5 mg 3 times daily.    Recent consult visits:  11/09/21 (Cardiology) BaLisette AbuN. Anti-Coag Visit. Continue taking warfarin 1/2 tablet daily. Repeat INR in 5 weeks   10/12/21 (Cardiology) JoMartiniquePeter MD. Seen for A-Fib and SOB. No med changes.    10/12/21 (Cardiology) DuJohny ShockN. Anti-Coag Visit. Continue taking warfarin 1/2 tablet daily.  Repeat INR in 4 weeks   09/14/21 (Cardiology) Lisette Abu RN. Anti-Coag Visit. Hold today's dose and then continue taking 1/2 tablet daily. Repeat INR in 2 weeks.   08/10/21 (Cardiology) Lisette Abu RN. Anti-Coag Visit. Continue taking 1/2 tablet daily. Repeat INR in 5 weeks   07/13/21 (Cardiology) Frederik Schmidt RN. Anti-Coag Visit. Hold  tonight only and then decrease to 1/2 tablet daily.  Repeat INR in 4 weeks   05/18/21 (Cardiology) Frederik Schmidt RN. Anti-Coag Visit. Continue taking 1/2 tablet daily except 1 tablet on Mondays.  Repeat INR in 8 weeks   Hospital visits:  None in previous 6 months   Objective:  Lab Results  Component Value Date   CREATININE 0.92 05/19/2021   BUN 19 05/19/2021   GFR 81.95 02/23/2012   GFRNONAA 51 (L) 12/08/2020   GFRAA 59 (L) 12/08/2020   NA 145 (H) 05/19/2021   K 4.7 05/19/2021   CALCIUM 9.9 05/19/2021   CO2 22 05/19/2021   GLUCOSE 115 (H) 05/19/2021    Lab Results  Component Value Date/Time   HGBA1C 6.6 (H) 05/19/2021 11:26 AM   HGBA1C 6.3 (H) 12/08/2020 11:46 AM   GFR 81.95 02/23/2012 11:55 AM   GFR 69.71 01/26/2012 09:24 AM   MICROALBUR 30 05/13/2020 04:20 PM   MICROALBUR 30 05/08/2019 12:46 PM    Last diabetic Eye exam:  Lab Results  Component Value Date/Time   HMDIABEYEEXA No Retinopathy 12/23/2020 12:00 AM    Last diabetic Foot exam: No results found for: HMDIABFOOTEX   Lab Results  Component Value Date   CHOL 165 05/19/2021   HDL 56 05/19/2021   LDLCALC 89 05/19/2021   TRIG 109 05/19/2021   CHOLHDL 2.9 05/19/2021    Hepatic Function Latest Ref Rng & Units 05/19/2021 12/08/2020 08/19/2020  Total Protein 6.0 - 8.5 g/dL 7.7 7.6 7.2  Albumin 3.6 - 4.6 g/dL 4.7(H) 4.3 4.3  AST 0 - 40 IU/L 40 30 28  ALT 0 - 32 IU/L 38(H) 25 19  Alk Phosphatase 44 - 121 IU/L 99 97 93  Total Bilirubin 0.0 - 1.2 mg/dL 0.5 0.5 0.3  Bilirubin, Direct 0.00 - 0.40 mg/dL - - -    Lab Results  Component Value Date/Time   TSH 2.340 03/26/2020 10:30 AM   TSH 1.170 03/26/2014 10:02 AM    CBC Latest Ref Rng & Units 05/19/2021 12/08/2020 08/19/2020  WBC 3.4 - 10.8 x10E3/uL 5.5 4.2 4.5  Hemoglobin 11.1 - 15.9 g/dL 14.2 13.4 12.5  Hematocrit 34.0 - 46.6 % 44.1 41.5 39.6  Platelets 150 - 450 x10E3/uL 245 252 225    Lab Results  Component Value Date/Time   VD25OH 75.7 12/08/2020 11:47  AM   VD25OH 49.2 08/19/2020 10:26 AM    Clinical ASCVD: Yes  The ASCVD Risk score (Arnett DK, et al., 2019) failed to calculate for the following reasons:   The 2019 ASCVD risk score is only valid for ages 49 to 70    Depression screen PHQ 2/9 11/16/2021 05/19/2021 05/13/2020  Decreased Interest 0 0 0  Down, Depressed, Hopeless 0 0 0  PHQ - 2 Score 0 0 0  Altered sleeping - - 0  Tired, decreased energy - - 0  Change in appetite - - 0  Feeling bad or failure about yourself  - - 0  Trouble concentrating - - 0  Moving slowly or fidgety/restless - - 0  Suicidal thoughts - - 0  PHQ-9 Score - - 0  Difficult doing work/chores - -  Not difficult at all  Some recent data might be hidden     Other: (CHADS2VASc if Afib, MMRC or CAT for COPD, ACT, DEXA)  Social History   Tobacco Use  Smoking Status Never  Smokeless Tobacco Never   BP Readings from Last 3 Encounters:  10/12/21 (!) 142/80  05/19/21 135/80  03/24/21 138/84   Pulse Readings from Last 3 Encounters:  10/12/21 74  05/19/21 99  03/24/21 (!) 103   Wt Readings from Last 3 Encounters:  10/12/21 167 lb 3.2 oz (75.8 kg)  05/19/21 169 lb (76.7 kg)  03/24/21 160 lb (72.6 kg)   BMI Readings from Last 3 Encounters:  10/12/21 28.70 kg/m  05/19/21 28.12 kg/m  03/24/21 26.63 kg/m    Assessment/Interventions: Review of patient past medical history, allergies, medications, health status, including review of consultants reports, laboratory and other test data, was performed as part of comprehensive evaluation and provision of chronic care management services.   SDOH:  (Social Determinants of Health) assessments and interventions performed: Yes SDOH Interventions    Flowsheet Row Most Recent Value  SDOH Interventions   Financial Strain Interventions Intervention Not Indicated      SDOH Screenings   Alcohol Screen: Not on file  Depression (PHQ2-9): Low Risk    PHQ-2 Score: 0  Financial Resource Strain: Low Risk     Difficulty of Paying Living Expenses: Not hard at all  Food Insecurity: Not on file  Housing: Not on file  Physical Activity: Not on file  Social Connections: Not on file  Stress: Not on file  Tobacco Use: Low Risk    Smoking Tobacco Use: Never   Smokeless Tobacco Use: Never   Passive Exposure: Not on file  Transportation Needs: Not on file    [place]  Allergies  Allergen Reactions   Sulfanilamide     unkn   Penicillins Rash    Medications Reviewed Today     Reviewed by Lane Hacker, [facility] (Pharmacist) on 11/16/21 at 1122  Med List Status: <None>   Medication Order Taking? Sig Documenting Provider Last Dose Status Informant  ascorbic acid (VITAMIN C) 500 MG tablet [id number]  Take 600 mg by mouth daily.  [provider]  Active Self  atorvastatin (LIPITOR) 80 MG tablet [id number]  Take 1 tablet (80 mg total) by mouth daily. [name], Peter M, MD  Expired 10/12/21 2359   Biotin 1000 MCG tablet [id number]  Take 1 tablet by mouth daily.  [provider]  Active Self  Calcium Carbonate-Vitamin D (CALCIUM + D PO) [id number]  Take by mouth daily. [provider]  Active Self  Cholecalciferol (VITAMIN D PO) [id number]  Take by mouth daily. [provider]  Active Self  cyanocobalamin (,VITAMIN B-12,) 1000 MCG/ML injection [id number]  Inject 1 mL (1,000 mcg total) into the muscle every 30 (thirty) days. [name], MD  Active   diltiazem [facility]) 120 MG 24 hr capsule [id number] Yes TAKE ONE CAPSULE BY MOUTH DAILY [name], Peter M, MD Taking Active   folic acid (FOLVITE) 1 MG tablet [id number]  Take 1 mg by mouth daily. [provider]  Active Self  furosemide (LASIX) 40 MG tablet [id number]  Take 0.5 tablets (20 mg total) by mouth as needed for fluid or edema (shortness of breath). [name], Peter M, MD  Active   gabapentin (NEURONTIN) 300 MG capsule [id number] Yes Take 600 mg by mouth at bedtime.  [provider] Taking Active  Self  Med Note Rosemarie Beath, MELISSA B   Thu Apr 16, 2020  3:59 PM)    HYDROcodone-acetaminophen (NORCO) 10-325 MG per tablet [id number] Yes Take 1 tablet by mouth every 6 (six) hours as needed for moderate pain.  [provider] Taking Active Self  lisinopril (ZESTRIL) 20 MG tablet [id number] Yes Take 1 tablet (20 mg total) by mouth daily. [name], MD Taking Active   oxybutynin [facility]) 5 MG tablet [id number] Yes Take 1 tablet (5 mg total) by mouth 3 (three) times daily. [name], MD Taking Active            Med Note ([place] Nov 16, 2021 11:22 AM) Only taking QD  pantoprazole (PROTONIX) 40 MG tablet [id number]  Take 40 mg by mouth daily. [provider]  Active Self  potassium chloride SA (KLOR-CON) 20 MEQ tablet [id number]  TAKE ONE TABLET BY MOUTH TWICE DAILY [name], Peter M, MD  Active   sertraline (ZOLOFT) 50 MG tablet [id number]  Take 75 mg by mouth daily.  [name], Peter M, MD  Active Self  Tiotropium Bromide Monohydrate (SPIRIVA RESPIMAT) 2.5 MCG/ACT AERS [id number] No Inhale 2 puffs into the lungs daily.  Patient not taking: Reported on 11/16/2021   [name], Peter M, MD Not Taking Consider Medication Status and Discontinue   valACYclovir (VALTREX) 500 MG tablet [id number] No Take 500 mg by mouth 2 (two) times daily.  Patient not taking: Reported on 11/16/2021   [provider] Not Taking Consider Medication Status and Discontinue   vitamin A 8000 UNIT capsule [id number]  Take 8,000 Units by mouth daily. [provider]  Active Self  warfarin (COUMADIN) 5 MG tablet [id number]  Take 0.5-1 tablets (2.5-5 mg total) by mouth See admin instructions. 5 mg on Mondays and 2.5 mg all other days [name], Peter M, MD  Active             Patient Active Problem List   Diagnosis Date Noted   Urge incontinence 05/19/2021   Foot ulcer, left, limited to breakdown of skin ([place]) 12/08/2020   Encounter for consultation 09/02/2020    PVD (peripheral vascular disease) ([place]) 11/13/2019   Serum calcium elevated 01/03/2019   Prediabetes 09/25/2018   Vitamin D deficiency 09/04/2018   A-fib ([place]) 09/04/2018   Abnormal glucose 09/04/2018   Need for influenza vaccination 08/08/2018   Osteoarthritis of left knee 06/07/2018   Long-term current use of opiate analgesic 11/22/2017   Chronic low back pain 11/22/2017   Long term (current) use of anticoagulants [Z79.01] 05/24/2017   Menopause present 03/27/2017   Osteopenia 03/27/2017   Chronic bronchitis with COPD (chronic obstructive pulmonary disease) ([place]) 03/24/2016   Left foot pain 02/21/2013   Anticoagulant long-term use 02/25/2011   Mitral insufficiency    Essential hypertension    LVH (left ventricular hypertrophy) due to hypertensive disease    Pulmonary hypertension ([place])    Hyperlipidemia    Back pain, chronic    Depression     Immunization History  Administered Date(s) Administered   DTaP 04/18/2011   Fluad Quad(high Dose 65+) 08/19/2020   Influenza, High Dose Seasonal PF 08/07/2018   Influenza,inj,Quad PF,6+ Mos 08/23/2013   Influenza,inj,quad, With Preservative 08/07/2017, 08/15/2019   Influenza-Unspecified 07/08/2014, 08/15/2019, 07/26/2021   Moderna Covid-19 Vaccine Bivalent Booster 7yr & up 08/13/2021   Moderna Sars-Covid-2 Vaccination 12/13/2019, 01/10/2020, 09/17/2020, 04/09/2021   Pneumococcal Conjugate-13 12/27/2013, 08/12/2019   Pneumococcal Polysaccharide-23 08/03/2010   Zoster Recombinat (Shingrix) 08/12/2019, 08/18/2020  Zoster, Live 04/11/2017    Conditions to be addressed/monitored:  Hypertension, Hyperlipidemia, Atrial Fibrillation, and COPD  Care Plan : [place]  Updates made by Lane Hacker, RPH since 11/16/2021 12:00 AM     Problem: COPD, Pain, AFib, Lipids   Priority: High  Onset Date: 11/16/2021     Long-Range Goal: Disease State Management   Start Date: 11/16/2021  Expected End Date: 11/16/2022  This  Visit's Progress: On track  Priority: High  Note:   Current Barriers:  Does not contact provider office for questions/concerns  Pharmacist Clinical Goal(s):  Patient will contact provider office for questions/concerns as evidenced notation of same in electronic health record through collaboration with PharmD and provider.   Interventions: 1:1 collaboration with [name], MD regarding development and update of comprehensive plan of care as evidenced by provider attestation and co-signature Inter-disciplinary care team collaboration (see longitudinal plan of care) Comprehensive medication review performed; medication list updated in electronic medical record  Hypertension (BP goal <140/90) BP Readings from Last 3 Encounters:  10/12/21 (!) 142/80  05/19/21 135/80  03/24/21 138/84  -Controlled -Current treatment: Diltiazem 177m Appropriate, Effective, Safe, Accessible Lisinopril 245mAppropriate, Effective, Safe, Accessible -Medications previously tried: N/A  -Current home readings: Doesn't test -Current dietary habits: "Tries to eat healthy" -Current exercise habits: None (Patient has to use a walker) -Denies hypotensive/hypertensive symptoms -Educated on BP goals and benefits of medications for prevention of heart attack, stroke and kidney damage; -Counseled to monitor BP at home PRN, document, and provide log at future appointments -Recommended to continue current medication  Hyperlipidemia: (LDL goal < 100) The ASCVD Risk score (Arnett DK, et al., 2019) failed to calculate for the following reasons:   The 2019 ASCVD risk score is only valid for ages 4067o 7960ab Results  Component Value Date   CHOL 165 05/19/2021   CHOL 156 12/08/2020   CHOL 145 08/19/2020   Lab Results  Component Value Date   HDL 56 05/19/2021   HDL 51 12/08/2020   HDL 44 08/19/2020   Lab Results  Component Value Date   LDLCALC 89 05/19/2021   LDLCALC 86 12/08/2020   LDLCALC 81  08/19/2020   Lab Results  Component Value Date   TRIG 109 05/19/2021   TRIG 105 12/08/2020   TRIG 107 08/19/2020   Lab Results  Component Value Date   CHOLHDL 2.9 05/19/2021   CHOLHDL 3.1 12/08/2020   CHOLHDL 3.3 08/19/2020  No results found for: LDLDIRECT -Controlled -Current treatment: Atorvastatin 8055mMedications previously tried: N/A  -Current dietary patterns: "Tries to eat healthy" -Current exercise habits: None -Educated on Benefits of statin for ASCVD risk reduction; -Recommended to continue current medication  Atrial Fibrillation (Goal: prevent stroke and major bleeding) -Controlled -CHADSVASC:   Jan 2023: 5 -Current treatment: Rate control:  Diltiazem 120m55mpropriate, Effective, Safe, Accessible Lisinopril 20mg53mAppropriate, Effective, Safe, Accessible Anticoagulation:  Warfarin UD Appropriate, Effective, Safe, Accessible -Medications previously tried: DOAC (Can't afford), Furosemide (Patient stopped due to ADR's) -Home BP and HR readings: Doesn't test  -Counseled on increased risk of stroke due to Afib and benefits of anticoagulation for stroke prevention; Jan 2023: Will let PCP know patient Dc'd Furosemide  COPD (Goal: control symptoms and prevent exacerbations) -Controlled -Current treatment  None -Medications previously tried: Tiotropium  -MMRC/CAT score: No flowsheet data found. -Exacerbations requiring treatment in last 6 months: None -Patient denies consistent use of maintenance inhaler -Frequency of rescue inhaler use: None -Counseled on Benefits  of consistent maintenance inhaler use Jan 2023: Patient is adamant she doesn't need an inhaler and didn't wish to speak further about COPD  Depression/Anxiety -Controlled -Current treatment: Sertraline 105m Appropriate, Effective, Safe, Accessible -Medications previously tried/failed: None -PHQ9:  Depression screen [facility] 11/16/2021 05/19/2021 05/13/2020  Decreased Interest 0 0 0  Down,  Depressed, Hopeless 0 0 0  PHQ - 2 Score 0 0 0  Altered sleeping - - 0  Tired, decreased energy - - 0  Change in appetite - - 0  Feeling bad or failure about yourself  - - 0  Trouble concentrating - - 0  Moving slowly or fidgety/restless - - 0  Suicidal thoughts - - 0  PHQ-9 Score - - 0  Difficult doing work/chores - - Not difficult at all  Some recent data might be hidden  -GAD7: No flowsheet data found. -Educated on Benefits of medication for symptom control Jan 2023: Patient very very adamant she is not depressed and before I could start the PHQ9 she said, "I'm not depressed and I'm not down and I don't have depression"  Chronic Pain (Back) -Pain in back since her 30's -Controlled -Current treatment: Hydrocodone/APAP 10/3242mQID PRN Appropriate, Effective, Safe, Accessible -Medications previously tried: None  -Pain Scale Today's Vitals   11/16/21 1100  PainSc: 9     Aggravating Factors: Movement  Pain Type: Chronic aching Jan 2023: Without Meds: 1-10/10, depends on the day With Meds: 0/10  -Recommended to continue current medication, patient very content. Pain happens occasionally   Chronic Pain (Neuropathy) -Controlled -Current treatment: Gabapentin 60032mS Appropriate, Effective, Safe, Accessible -Medications previously tried: None  -Pain Scale Aggravating Factors: None, pain is constant  Pain Type: Burning Jan 2023 Without meds: 5/10 With Meds: 3-4/10  -Recommended to continue current medication  OAB (Goal: Decrease Urination) -Controlled -Current treatment  Oxybutynin 5mg67mD (Taking QD) Query Appropriate, Query effective, Query Safe, Accessible -Medications previously tried: N/A  Jan 2023: Attempted to talk with patient about this disease state, frequency of urination etc. Patient changed topics. Brought up the idea that Oxybutynin IR TID could be changed for QD so it'll last all day and patient didn't wish to change therapy at this time   Patient  Goals/Self-Care Activities Patient will:  - take medications as prescribed as evidenced by patient report and record review  Follow Up Plan: The patient has been provided with contact information for the care management team and has been advised to call with any health related questions or concerns.   CPP F/U July 2023  [place] ConstablearSherian Rein- [phone]       Medication Assistance: None required.  Patient affirms current coverage meets needs.  Compliance/Adherence/Medication fill history: [facility]:  Medication:                Last Fill:         Day Supply Lisinopril                      08/12/21            90ds                                     05/26/21            90ds Atorvastatin                 10/12/21  30ds                                     09/13/21            30ds   Care Gaps: Last annual wellness visit?None noted  Mammogram: 02/01/21 Dexa Scan: 07/08/19 Last eye exam / retinopathy screening?12/23/20 Last diabetic foot exam?None noted   Patient's preferred pharmacy is:  [facility], [place] [address] Phone: [phone] Fax: [phone]  Uses pill box? Yes Pt endorses 100% compliance  We discussed: Benefits of medication synchronization, packaging and delivery as well as enhanced pharmacist oversight with [employer]. Patient decided to: Continue current medication management strategy  Care Plan and Follow Up Patient Decision:  Patient agrees to Care Plan and Follow-up.  Plan: The patient has been provided with contact information for the care management team and has been advised to call with any health related questions or concerns.   CPP F/U July 2023  [place] Constable, Sherian Rein.D. - [phone]

## 2021-12-07 ENCOUNTER — Other Ambulatory Visit: Payer: Self-pay

## 2021-12-07 ENCOUNTER — Ambulatory Visit (INDEPENDENT_AMBULATORY_CARE_PROVIDER_SITE_OTHER): Payer: Medicare Other

## 2021-12-07 DIAGNOSIS — I482 Chronic atrial fibrillation, unspecified: Secondary | ICD-10-CM | POA: Diagnosis not present

## 2021-12-07 DIAGNOSIS — Z5181 Encounter for therapeutic drug level monitoring: Secondary | ICD-10-CM

## 2021-12-07 DIAGNOSIS — I10 Essential (primary) hypertension: Secondary | ICD-10-CM | POA: Diagnosis not present

## 2021-12-07 DIAGNOSIS — I4811 Longstanding persistent atrial fibrillation: Secondary | ICD-10-CM | POA: Diagnosis not present

## 2021-12-07 DIAGNOSIS — Z7901 Long term (current) use of anticoagulants: Secondary | ICD-10-CM | POA: Diagnosis not present

## 2021-12-07 DIAGNOSIS — I4891 Unspecified atrial fibrillation: Secondary | ICD-10-CM | POA: Diagnosis not present

## 2021-12-07 LAB — POCT INR: INR: 3.5 — AB (ref 2.0–3.0)

## 2021-12-07 NOTE — Patient Instructions (Signed)
Description   Hold today's dose and then continue taking warfarin 1/2 tablet daily.  Stay consistent with greens (3 times per week)  Repeat INR in 4 weeks. [facility]: [phone]

## 2021-12-09 ENCOUNTER — Encounter: Payer: Self-pay | Admitting: Legal Medicine

## 2021-12-09 ENCOUNTER — Other Ambulatory Visit: Payer: Self-pay

## 2021-12-09 ENCOUNTER — Ambulatory Visit (INDEPENDENT_AMBULATORY_CARE_PROVIDER_SITE_OTHER): Payer: Medicare Other | Admitting: Legal Medicine

## 2021-12-09 VITALS — BP 166/90 | HR 97 | Temp 98.3°F | Resp 16 | Ht 64.0 in | Wt 163.0 lb

## 2021-12-09 DIAGNOSIS — R7303 Prediabetes: Secondary | ICD-10-CM | POA: Diagnosis not present

## 2021-12-09 DIAGNOSIS — L97521 Non-pressure chronic ulcer of other part of left foot limited to breakdown of skin: Secondary | ICD-10-CM

## 2021-12-09 DIAGNOSIS — I272 Pulmonary hypertension, unspecified: Secondary | ICD-10-CM

## 2021-12-09 DIAGNOSIS — Z7901 Long term (current) use of anticoagulants: Secondary | ICD-10-CM | POA: Diagnosis not present

## 2021-12-09 DIAGNOSIS — E538 Deficiency of other specified B group vitamins: Secondary | ICD-10-CM

## 2021-12-09 DIAGNOSIS — E559 Vitamin D deficiency, unspecified: Secondary | ICD-10-CM

## 2021-12-09 DIAGNOSIS — I4811 Longstanding persistent atrial fibrillation: Secondary | ICD-10-CM | POA: Diagnosis not present

## 2021-12-09 DIAGNOSIS — E782 Mixed hyperlipidemia: Secondary | ICD-10-CM | POA: Diagnosis not present

## 2021-12-09 DIAGNOSIS — J449 Chronic obstructive pulmonary disease, unspecified: Secondary | ICD-10-CM | POA: Diagnosis not present

## 2021-12-09 DIAGNOSIS — J4489 Other specified chronic obstructive pulmonary disease: Secondary | ICD-10-CM

## 2021-12-09 DIAGNOSIS — Z79891 Long term (current) use of opiate analgesic: Secondary | ICD-10-CM

## 2021-12-09 DIAGNOSIS — M8588 Other specified disorders of bone density and structure, other site: Secondary | ICD-10-CM

## 2021-12-09 DIAGNOSIS — I739 Peripheral vascular disease, unspecified: Secondary | ICD-10-CM

## 2021-12-09 DIAGNOSIS — I10 Essential (primary) hypertension: Secondary | ICD-10-CM

## 2021-12-09 DIAGNOSIS — N3941 Urge incontinence: Secondary | ICD-10-CM

## 2021-12-09 DIAGNOSIS — E1140 Type 2 diabetes mellitus with diabetic neuropathy, unspecified: Secondary | ICD-10-CM

## 2021-12-09 MED ORDER — CYANOCOBALAMIN 1000 MCG/ML IJ SOLN
1000.0000 ug | Freq: Once | INTRAMUSCULAR | Status: AC
  Administered 2021-12-09: 1000 ug via INTRAMUSCULAR

## 2021-12-09 NOTE — Progress Notes (Signed)
Subjective:  Patient ID: [name], female    DOB: 04-15-1932  Age: 88 y.o. MRN: [id number]  Chief Complaint  Patient presents with   Hypertension   Prediabetes   Atrial Fibrillation    HPI  Hypertension: Patient is taking lisinopril 20 mg daily, Diltiazem 120 ng daily. Patient presents for follow up of hypertension.  Patient tolerating lisinopril well with side effects.  Patient was diagnosed with hypertension 2010 so has been treated for hypertension for 12 years.Patient is working on maintaining diet and exercise regimen and follows up as directed. Complication include LVH.    Hyperlipidemia: She takes atorvastatin 80 mg daily. Patient presents with hyperlipidemia.  Compliance with treatment has been good; patient takes medicines as directed, maintains low cholesterol diet, follows up as directed, and maintains exercise regimen.  Patient is using atorvastatin without problems.   GERD: Takes pantoprazole 40 mg daily.  Afib: Currently taking warfarin 5 mg daily. She has been checking INR and follow up with Dr Peter [name].   Current Outpatient Medications on File Prior to Visit  Medication Sig Dispense Refill   ascorbic acid (VITAMIN C) 500 MG tablet Take 600 mg by mouth daily.      Biotin 1000 MCG tablet Take 1 tablet by mouth daily.      Calcium Carbonate-Vitamin D (CALCIUM + D PO) Take by mouth daily.     Cholecalciferol (VITAMIN D PO) Take by mouth daily.     cyanocobalamin (,VITAMIN B-12,) 1000 MCG/ML injection Inject 1 mL (1,000 mcg total) into the muscle every 30 (thirty) days. 10 mL 3   diltiazem (TIAZAC) 120 MG 24 hr capsule TAKE ONE CAPSULE BY MOUTH DAILY 90 capsule 3   HYDROcodone-acetaminophen (NORCO) 10-325 MG per tablet Take 1 tablet by mouth every 6 (six) hours as needed for moderate pain.      lisinopril (ZESTRIL) 20 MG tablet Take 1 tablet (20 mg total) by mouth daily. 90 tablet 2   oxybutynin (DITROPAN) 5 MG tablet Take 1 tablet (5 mg total) by mouth 3  (three) times daily. 90 tablet 2   pantoprazole (PROTONIX) 40 MG tablet Take 40 mg by mouth daily.     potassium chloride SA (KLOR-CON) 20 MEQ tablet TAKE ONE TABLET BY MOUTH TWICE DAILY 180 tablet 3   sertraline (ZOLOFT) 50 MG tablet Take 75 mg by mouth daily.      vitamin A 8000 UNIT capsule Take 8,000 Units by mouth daily.     warfarin (COUMADIN) 5 MG tablet Take 0.5-1 tablets (2.5-5 mg total) by mouth See admin instructions. 5 mg on Mondays and 2.5 mg all other days 48 tablet 1   atorvastatin (LIPITOR) 80 MG tablet Take 1 tablet (80 mg total) by mouth daily. 90 tablet 3   No current facility-administered medications on file prior to visit.   Past Medical History:  Diagnosis Date   Arthritis    Back pain, chronic    Coronary artery disease CARDIOLOGIST- DR [name]--  LOV IN EPIC   Depression    Hammer toe    Hypercholesterolemia    Hypertensive heart disease    WITH LVH   LVH (left ventricular hypertrophy) due to hypertensive disease    Mitral insufficiency    CHRONIC   Pulmonary hypertension ([place])    Transfusion history    Past Surgical History:  Procedure Laterality Date   ABDOMINAL AORTOGRAM W/LOWER EXTREMITY N/A 04/29/2020   Procedure: ABDOMINAL AORTOGRAM W/LOWER EXTREMITY;  Surgeon: [name], MD;  Location: [facility];  Service: Cardiovascular;  Laterality: N/A;  Lt Leg   ABDOMINAL HYSTERECTOMY     ANTERIOR REPAIR/ [place] PUBOVAGINAL SLING  05-03-2002   BUNIONECTOMY WITH HAMMERTOE RECONSTRUCTION Right 03/01/2013   Procedure: RIGHT FOOT EXCISION BUNIONETTE AND FUSION OF DIP FOURTH TOE;  Surgeon: [name], MD;  Location: [facility];  Service: Orthopedics;  Laterality: Right;   CARDIAC CATHETERIZATION  02-03-2009  DR  [name]   SINGLE-VESSEL OBSTRUCTIVE CAD, SMALL DIAGONAL BRANCH/ NORMAL LVF/ SEVERE MITRAL INSUFFICIENCY/ SEVERE PULMONARY HYPERTENSION   CATARACT EXTRACTION W/ INTRAOCULAR LENS  IMPLANT, BILATERAL  2003   COLONOSCOPY   07/25/2014   few mall scattered diverticula in the ectire examined colon. Small internal hemmorhoids.    HAMMER TOE SURGERY  02/28/2012   Procedure: HAMMER TOE CORRECTION;  Surgeon: [name], MD;  [facility];  Service: Orthopedics;  Laterality: Right;  FUSION OF PROXIMAL INTERPHALANGEAL  RIGHT THIRD CLAW TOE   PARS PLANA VITRECTOMY W/ REPAIR OF MACULAR HOLE  02-04-2002   LEFT EYE   PERIPHERAL VASCULAR BALLOON ANGIOPLASTY  04/29/2020   Procedure: PERIPHERAL VASCULAR BALLOON ANGIOPLASTY;  Surgeon: [name], MD;  Location: [facility];  Service: Cardiovascular;;  Lt. AT   REPAIR RIGHT SECOND CLAW TOE/ VARUS MEDIAL CLOSING WEDGE OSTEOTOMY PROXIMAL PHALANX RIGHT GREAT TOE  05-05-2005   TOTAL HIP ARTHROPLASTY     BILATERAL---  LEFT 4/98;   RIGHT  11/98   VARUS OSTEOTOMY PROXIMAL PHALANX, LEFT GREAT TOE/ PARING DOWN THE CALLUS , SECOND LEFT TOE  10-27-2008    Family History  Problem Relation Age of Onset   Heart disease Mother 41   Prostate cancer Father 40   Social History   Socioeconomic History   Marital status: Divorced    Spouse name: Not on file   Number of children: 3   Years of education: College   Highest education level: Not on file  Occupational History   Occupation: Retired    Fish farm manager: RETIRED  Tobacco Use   Smoking status: Never   Smokeless tobacco: Never  Vaping Use   Vaping Use: Never used  Substance and Sexual Activity   Alcohol use: Not Currently    Comment: occasional   Drug use: Yes    Types: Hydrocodone   Sexual activity: Not Currently  Other Topics Concern   Not on file  Social History Narrative   Patient lives at home alone.   Caffeine Use: 1 cup daily occasionally   Social Determinants of Health   Financial Resource Strain: Low Risk    Difficulty of Paying Living Expenses: Not hard at all  Food Insecurity: Not on file  Transportation Needs: Not on file  Physical Activity: Not on file  Stress: Not on  file  Social Connections: Not on file    Review of Systems  Constitutional:  Negative for chills, fatigue and fever.  HENT:  Negative for congestion, ear pain and sore throat.   Eyes:  Negative for visual disturbance.  Respiratory:  Negative for cough and shortness of breath.   Cardiovascular:  Negative for chest pain and palpitations.       Flank pain on right sided   Gastrointestinal:  Negative for abdominal pain, constipation, diarrhea, nausea and vomiting.  Endocrine: Negative for polydipsia, polyphagia and polyuria.  Genitourinary:  Negative for difficulty urinating and dysuria.  Musculoskeletal:  Negative for arthralgias, back pain and myalgias.  Skin:  Negative for rash.  Neurological:  Negative for headaches.  Psychiatric/Behavioral:  Negative for dysphoric mood. The patient is not nervous/anxious.     Objective:  BP (!) 166/90    Pulse 97    Temp 98.3 F (36.8 C)    Resp 16    Ht 5\' 4"  (1.626 m)    Wt 163 lb (73.9 kg)    SpO2 97%    BMI 27.98 kg/m   BP/Weight 12/09/2021 10/12/2021 05/19/4579  Systolic BP 998 338 250  Diastolic BP 90 80 80  Wt. (Lbs) 163 167.2 169  BMI 27.98 28.7 28.12    Physical Exam Vitals reviewed.  Constitutional:      General: She is not in acute distress.    Appearance: Normal appearance. She is obese.  HENT:     Head: Normocephalic.     Right Ear: Tympanic membrane, ear canal and external ear normal.     Left Ear: Tympanic membrane, ear canal and external ear normal.     Nose: Nose normal.     Mouth/Throat:     Mouth: Mucous membranes are moist.     Pharynx: Oropharynx is clear. No posterior oropharyngeal erythema.  Eyes:     Extraocular Movements: Extraocular movements intact.     Conjunctiva/sclera: Conjunctivae normal.     Pupils: Pupils are equal, round, and reactive to light.  Neck:     Vascular: No carotid bruit.  Cardiovascular:     Rate and Rhythm: Normal rate. Rhythm irregular.     Pulses: Normal pulses.     Heart sounds:  Murmur (MI murmur) heard.     Comments: Loud insufiency murmur Pulmonary:     Effort: Pulmonary effort is normal. No respiratory distress.     Breath sounds: Normal breath sounds. No wheezing.  Abdominal:     General: Bowel sounds are normal. There is no distension.     Palpations: Abdomen is soft. There is no mass.     Tenderness: There is no abdominal tenderness.  Musculoskeletal:        General: Normal range of motion.     Cervical back: Normal range of motion. No tenderness.     Right lower leg: No edema.     Left lower leg: No edema.  Skin:    General: Skin is warm.     Capillary Refill: Capillary refill takes less than 2 seconds.     Findings: Lesion (foot ulcers) present.  Neurological:     General: No focal deficit present.     Mental Status: She is alert and oriented to person, place, and time. Mental status is at baseline.     Gait: Gait normal.     Deep Tendon Reflexes: Reflexes normal.  Psychiatric:        Mood and Affect: Mood normal.        Behavior: Behavior normal.        Thought Content: Thought content normal.    Diabetic Foot Exam - Simple   Simple Foot Form Diabetic Foot exam was performed with the following findings: Yes 12/09/2021  9:34 AM  Visual Inspection See comments: Yes Sensation Testing See comments: Yes Pulse Check See comments: Yes Comments Left foot 2 ulcers on both side and they are not infected. Decreased sensation and circulation on both feet.       Lab Results  Component Value Date   WBC 5.5 05/19/2021   HGB 14.2 05/19/2021   HCT 44.1 05/19/2021   PLT 245 05/19/2021   GLUCOSE 115 (H) 05/19/2021   CHOL 165 05/19/2021  TRIG 109 05/19/2021   HDL 56 05/19/2021   LDLCALC 89 05/19/2021   ALT 38 (H) 05/19/2021   AST 40 05/19/2021   NA 145 (H) 05/19/2021   K 4.7 05/19/2021   CL 106 05/19/2021   CREATININE 0.92 05/19/2021   BUN 19 05/19/2021   CO2 22 05/19/2021   TSH 2.340 03/26/2020   INR 3.5 (A) 12/07/2021   HGBA1C 6.6 (H)  05/19/2021   MICROALBUR 30 05/13/2020      Assessment & Plan:   Problem List Items Addressed This Visit       Cardiovascular and Mediastinum   Essential hypertension - Primary   Relevant Orders   Comprehensive metabolic panel   CBC with Differential/Platelet An individual hypertension care plan was established and reinforced today.  The patient's status was assessed using clinical findings on exam and labs or diagnostic tests. The patient's success at meeting treatment goals on disease specific evidence-based guidelines and found to be fair controlled. SELF MANAGEMENT: The patient and I together assessed ways to personally work towards obtaining the recommended goals. RECOMMENDATIONS: avoid decongestants found in common cold remedies, decrease consumption of alcohol, perform routine monitoring of BP with home BP cuff, exercise, reduction of dietary salt, take medicines as prescribed, try not to miss doses and quit smoking.  Regular exercise and maintaining a healthy weight is needed.  Stress reduction may help. A CLINICAL SUMMARY including written plan identify barriers to care unique to individual due to social or financial issues.  We attempt to mutually creat solutions for individual and family understanding.    Pulmonary hypertension (HCC) Stable, seeing cardiology    A-fib [facility]) Patient has a diagnosis of chronic atrial fibrillation.   Patient is on coumadin and has controlled ventricular response.  Patient is CV stable.      Respiratory   Chronic bronchitis with COPD (chronic obstructive pulmonary disease) ([place]) An individualize plan was formulated for care of COPD.  Treatment is evidence based.  She will continue on inhalers, avoid smoking and smoke.  Regular exercise with help with dyspnea. Routine follow ups and medication compliance is needed.      Musculoskeletal and Integument   Osteopenia Patient is on calcium and vitamin d    Foot ulcer, left, limited to breakdown of  skin [facility]) Patient has 2 ulcers left foot- refer to [facility]     Other   Hyperlipidemia   Relevant Orders   Lipid panel   TSH AN INDIVIDUAL CARE PLAN for hyperlipidemia/ cholesterol was established and reinforced today.  The patient's status was assessed using clinical findings on exam, lab and other diagnostic tests. The patient's disease status was assessed based on evidence-based guidelines and found to be fair controlled. MEDICATIONS were reviewed. SELF MANAGEMENT GOALS have been discussed and patient's success at attaining the goal of low cholesterol was assessed. RECOMMENDATION given include regular exercise 3 days a week and low cholesterol/low fat diet. CLINICAL SUMMARY including written plan to identify barriers unique to the patient due to social or economic  reasons was discussed.       Vitamin D deficiency   Relevant Orders   VITAMIN D 25 Hydroxy (Vit-D Deficiency, Fractures) Check for vitamin d deficiency   Prediabetes   Relevant Orders   Hemoglobin A1c Patient is now having diabetic complications and should now be considered diabetic    Long-term current use of opiate analgesic Patient has long term pain    Urge incontinence Patient is on ditropan   Other  Visit Diagnoses     PAD (peripheral artery disease) ([place])   (Chronic)     Relevant Orders   Ambulatory referral to Podiatry Patient has some peripheral artery disease with trace pulses and some skin breakdown on the left foot.    Type 2 diabetes mellitus with diabetic neuropathy, without long-term current use of insulin (HCC)       Relevant Orders   For Home Use Only DME Diabetic Shoe   Ambulatory referral to Podiatry Patient now has some neuropathy and has been a borderline diabetic for quite some time I feel that she should be now included as a diabetic with peripheral neuropathy and diabetic ulcers on left foot   B12 deficiency       Relevant Medications   cyanocobalamin ((VITAMIN B-12))  injection 1,000 mcg (Completed) Patient has chronic B12 deficiency.  We will get a injection today.     .  Meds ordered this encounter  Medications   cyanocobalamin ((VITAMIN B-12)) injection 1,000 mcg    Orders Placed This Encounter  Procedures   For Home Use Only DME Diabetic Shoe   Comprehensive metabolic panel   Hemoglobin A1c   Lipid panel   VITAMIN D 25 Hydroxy (Vit-D Deficiency, Fractures)   CBC with Differential/Platelet   TSH   Ambulatory referral to Podiatry   30 minute visit with review of old records  Follow-up: Return in about 3 months (around 03/08/2022) for fasting.  An After Visit Summary was printed and given to the patient.  Reinaldo Meeker, MD [facility] [phone]

## 2021-12-10 LAB — LIPID PANEL
Chol/HDL Ratio: 3.4 ratio (ref 0.0–4.4)
Cholesterol, Total: 142 mg/dL (ref 100–199)
HDL: 42 mg/dL (ref >39)
LDL Chol Calc (NIH): 75 mg/dL (ref 0–99)
Triglycerides: 141 mg/dL (ref 0–149)
VLDL Cholesterol Cal: 25 mg/dL (ref 5–40)

## 2021-12-10 LAB — CBC WITH DIFFERENTIAL/PLATELET
Basophils Absolute: 0 10*3/uL (ref 0.0–0.2)
Basos: 1 %
EOS (ABSOLUTE): 0.2 10*3/uL (ref 0.0–0.4)
Eos: 3 %
Hematocrit: 40.9 % (ref 34.0–46.6)
Hemoglobin: 13.1 g/dL (ref 11.1–15.9)
Immature Grans (Abs): 0 10*3/uL (ref 0.0–0.1)
Immature Granulocytes: 1 %
Lymphocytes Absolute: 0.8 10*3/uL (ref 0.7–3.1)
Lymphs: 14 %
MCH: 29.2 pg (ref 26.6–33.0)
MCHC: 32 g/dL (ref 31.5–35.7)
MCV: 91 fL (ref 79–97)
Monocytes Absolute: 0.6 10*3/uL (ref 0.1–0.9)
Monocytes: 11 %
Neutrophils Absolute: 3.8 10*3/uL (ref 1.4–7.0)
Neutrophils: 70 %
Platelets: 237 10*3/uL (ref 150–450)
RBC: 4.48 x10E6/uL (ref 3.77–5.28)
RDW: 14.2 % (ref 11.7–15.4)
WBC: 5.3 10*3/uL (ref 3.4–10.8)

## 2021-12-10 LAB — TSH: TSH: 2.56 u[IU]/mL (ref 0.450–4.500)

## 2021-12-10 LAB — COMPREHENSIVE METABOLIC PANEL
ALT: 40 IU/L — ABNORMAL HIGH (ref 0–32)
AST: 42 IU/L — ABNORMAL HIGH (ref 0–40)
Albumin/Globulin Ratio: 1.6 (ref 1.2–2.2)
Albumin: 4.3 g/dL (ref 3.6–4.6)
Alkaline Phosphatase: 93 IU/L (ref 44–121)
BUN/Creatinine Ratio: 24 (ref 12–28)
BUN: 21 mg/dL (ref 8–27)
Bilirubin Total: 0.6 mg/dL (ref 0.0–1.2)
CO2: 22 mmol/L (ref 20–29)
Calcium: 10.1 mg/dL (ref 8.7–10.3)
Chloride: 103 mmol/L (ref 96–106)
Creatinine, Ser: 0.88 mg/dL (ref 0.57–1.00)
Globulin, Total: 2.7 g/dL (ref 1.5–4.5)
Glucose: 112 mg/dL — ABNORMAL HIGH (ref 70–99)
Potassium: 4.3 mmol/L (ref 3.5–5.2)
Sodium: 140 mmol/L (ref 134–144)
Total Protein: 7 g/dL (ref 6.0–8.5)
eGFR: 64 mL/min/{1.73_m2} (ref >59)

## 2021-12-10 LAB — VITAMIN D 25 HYDROXY (VIT D DEFICIENCY, FRACTURES): Vit D, 25-Hydroxy: 143 ng/mL — ABNORMAL HIGH (ref 30.0–100.0)

## 2021-12-10 LAB — CARDIOVASCULAR RISK ASSESSMENT

## 2021-12-10 LAB — HEMOGLOBIN A1C
Est. average glucose Bld gHb Est-mCnc: 140 mg/dL
Hgb A1c MFr Bld: 6.5 % — ABNORMAL HIGH (ref 4.8–5.6)

## 2021-12-20 ENCOUNTER — Other Ambulatory Visit: Payer: Self-pay | Admitting: Cardiology

## 2021-12-23 ENCOUNTER — Other Ambulatory Visit: Payer: Self-pay | Admitting: Legal Medicine

## 2021-12-23 DIAGNOSIS — N3941 Urge incontinence: Secondary | ICD-10-CM

## 2021-12-30 DIAGNOSIS — L820 Inflamed seborrheic keratosis: Secondary | ICD-10-CM | POA: Diagnosis not present

## 2022-01-03 ENCOUNTER — Telehealth: Payer: Self-pay

## 2022-01-03 ENCOUNTER — Ambulatory Visit: Payer: Medicare Other | Admitting: Podiatry

## 2022-01-03 NOTE — Chronic Care Management (AMB) (Signed)
° ° °  Chronic Care Management Pharmacy Assistant   Name: [name]  MRN: [id number] DOB: 05/24/1932  Reason for Encounter: Disease State/ General  Recent office visits:  12-09-2021 [name], MD. Glucose= 112, AST= 42, ALT= 40. A1C= 6.5. Vitamin D = 143. Referral place to podiatry. Order placed for diabetic shoes.  Recent consult visits:  12-07-2021 [name], RN. Anti-cog visit.  Hospital visits:  None in previous 6 months  Medications: Outpatient Encounter Medications as of 01/03/2022  Medication Sig   ascorbic acid (VITAMIN C) 500 MG tablet Take 600 mg by mouth daily.    atorvastatin (LIPITOR) 80 MG tablet Take 1 tablet (80 mg total) by mouth daily.   Biotin 1000 MCG tablet Take 1 tablet by mouth daily.    Calcium Carbonate-Vitamin D (CALCIUM + D PO) Take by mouth daily.   Cholecalciferol (VITAMIN D PO) Take by mouth daily.   cyanocobalamin (,VITAMIN B-12,) 1000 MCG/ML injection Inject 1 mL (1,000 mcg total) into the muscle every 30 (thirty) days.   diltiazem (TIAZAC) 120 MG 24 hr capsule TAKE ONE CAPSULE BY MOUTH DAILY   HYDROcodone-acetaminophen (NORCO) 10-325 MG per tablet Take 1 tablet by mouth every 6 (six) hours as needed for moderate pain.    lisinopril (ZESTRIL) 20 MG tablet Take 1 tablet (20 mg total) by mouth daily.   oxybutynin (DITROPAN) 5 MG tablet Take 1 tablet (5 mg total) by mouth 3 (three) times daily.   pantoprazole (PROTONIX) 40 MG tablet Take 40 mg by mouth daily.   potassium chloride SA (KLOR-CON) 20 MEQ tablet TAKE ONE TABLET BY MOUTH TWICE DAILY   sertraline (ZOLOFT) 50 MG tablet Take 75 mg by mouth daily.    vitamin A 8000 UNIT capsule Take 8,000 Units by mouth daily.   warfarin (COUMADIN) 5 MG tablet Take 0.5-1 tablets (2.5-5 mg total) by mouth See admin instructions. 5 mg on Mondays and 2.5 mg all other days   No facility-administered encounter medications on file as of 01/03/2022.    01-03-2022: 1st attempt VM  full 01-04-2022: 2nd attempt VM full  Care Gaps: Last annual wellness visit? None Last eye exam / retinopathy screening? 12-23-2020 Diabetic foot exam? None referral placed  Star Rating Drugs: Lisinopril 20 mg- Last filled 11-10-2021 90 DS Atorvastatin 80 mg- Last filled 12-09-2021 100 DS  [place] Clinical Pharmacist Assistant [phone]

## 2022-01-04 ENCOUNTER — Ambulatory Visit: Payer: Medicare Other

## 2022-01-04 ENCOUNTER — Other Ambulatory Visit: Payer: Self-pay

## 2022-01-04 DIAGNOSIS — I482 Chronic atrial fibrillation, unspecified: Secondary | ICD-10-CM

## 2022-01-04 DIAGNOSIS — I4891 Unspecified atrial fibrillation: Secondary | ICD-10-CM

## 2022-01-04 DIAGNOSIS — Z7901 Long term (current) use of anticoagulants: Secondary | ICD-10-CM

## 2022-01-04 DIAGNOSIS — Z5181 Encounter for therapeutic drug level monitoring: Secondary | ICD-10-CM | POA: Diagnosis not present

## 2022-01-04 LAB — POCT INR: INR: 3.2 — AB (ref 2.0–3.0)

## 2022-01-04 NOTE — Patient Instructions (Signed)
Description   Hold today's dose and then continue taking warfarin 1/2 tablet daily.  Stay consistent with greens (3 times per week)  Repeat INR in 4 weeks. Coumadin Clinic: [phone]

## 2022-01-11 ENCOUNTER — Other Ambulatory Visit: Payer: Self-pay

## 2022-01-11 DIAGNOSIS — Z1231 Encounter for screening mammogram for malignant neoplasm of breast: Secondary | ICD-10-CM

## 2022-01-17 DIAGNOSIS — K2100 Gastro-esophageal reflux disease with esophagitis, without bleeding: Secondary | ICD-10-CM | POA: Diagnosis not present

## 2022-01-20 ENCOUNTER — Other Ambulatory Visit: Payer: Self-pay | Admitting: Legal Medicine

## 2022-01-24 ENCOUNTER — Ambulatory Visit: Payer: Self-pay | Admitting: Podiatry

## 2022-01-28 ENCOUNTER — Telehealth: Payer: Self-pay

## 2022-01-28 NOTE — Chronic Care Management (AMB) (Signed)
? ? ?  Chronic Care Management ?Pharmacy Assistant  ? ?Name: [name]  MRN: [id number] DOB: 01-15-1932 ? ?Reason for Encounter: Disease State/ General ?  ?Recent office visits:  ?12-09-2021 [name], MD. Glucose= 112, AST= 45, ALT= 40. ? ?Recent consult visits:  ?01-04-2022 [name], RN. Anti-coag visit. ? ?12-07-2021 [name], RN. Anti-coag visit. ? ?Hospital visits:  ?None in previous 6 months ? ?Medications: ?Outpatient Encounter Medications as of 01/28/2022  ?Medication Sig  ? ascorbic acid (VITAMIN C) 500 MG tablet Take 600 mg by mouth daily.   ? atorvastatin (LIPITOR) 80 MG tablet Take 1 tablet (80 mg total) by mouth daily.  ? Biotin 1000 MCG tablet Take 1 tablet by mouth daily.   ? Calcium Carbonate-Vitamin D (CALCIUM + D PO) Take by mouth daily.  ? Cholecalciferol (VITAMIN D PO) Take by mouth daily.  ? cyanocobalamin (,VITAMIN B-12,) 1000 MCG/ML injection Inject 1 mL (1,000 mcg total) into the muscle every 30 (thirty) days.  ? diltiazem (TIAZAC) 120 MG 24 hr capsule TAKE ONE CAPSULE BY MOUTH DAILY  ? HYDROcodone-acetaminophen (NORCO) 10-325 MG per tablet Take 1 tablet by mouth every 6 (six) hours as needed for moderate pain.   ? lisinopril (ZESTRIL) 20 MG tablet Take 1 tablet (20 mg total) by mouth daily.  ? oxybutynin (DITROPAN) 5 MG tablet Take 1 tablet (5 mg total) by mouth 3 (three) times daily.  ? pantoprazole (PROTONIX) 40 MG tablet Take 40 mg by mouth daily.  ? potassium chloride SA (KLOR-CON) 20 MEQ tablet TAKE ONE TABLET BY MOUTH TWICE DAILY  ? sertraline (ZOLOFT) 50 MG tablet Take 75 mg by mouth daily.   ? vitamin A 8000 UNIT capsule Take 8,000 Units by mouth daily.  ? warfarin (COUMADIN) 5 MG tablet Take 0.5-1 tablets (2.5-5 mg total) by mouth See admin instructions. 5 mg on Mondays and 2.5 mg all other days  ? ?No facility-administered encounter medications on file as of 01/28/2022.  ? ? ?01-28-2022: 1st attempt left VM ?02-01-2022: 2nd attempt left  VM ?02-02-2022: 3rd attempt left VM ? ?Star Rating Drugs: ?Lisinopril 20 mg- Last filled 11-10-2021 90 DS ?Atorvastatin 80 mg- Last filled 12-09-2021 100 DS ? ?Malecca Hicks CMA ?Clinical Pharmacist Assistant ?[phone] ? ?

## 2022-02-01 DIAGNOSIS — Z7901 Long term (current) use of anticoagulants: Secondary | ICD-10-CM | POA: Diagnosis not present

## 2022-02-01 DIAGNOSIS — R296 Repeated falls: Secondary | ICD-10-CM | POA: Diagnosis not present

## 2022-02-01 DIAGNOSIS — S92511D Displaced fracture of proximal phalanx of right lesser toe(s), subsequent encounter for fracture with routine healing: Secondary | ICD-10-CM | POA: Diagnosis not present

## 2022-02-01 DIAGNOSIS — W19XXXA Unspecified fall, initial encounter: Secondary | ICD-10-CM | POA: Diagnosis not present

## 2022-02-01 DIAGNOSIS — R55 Syncope and collapse: Secondary | ICD-10-CM | POA: Diagnosis not present

## 2022-02-01 DIAGNOSIS — I10 Essential (primary) hypertension: Secondary | ICD-10-CM | POA: Diagnosis not present

## 2022-02-01 DIAGNOSIS — Z7401 Bed confinement status: Secondary | ICD-10-CM | POA: Diagnosis not present

## 2022-02-01 DIAGNOSIS — S32030A Wedge compression fracture of third lumbar vertebra, initial encounter for closed fracture: Secondary | ICD-10-CM | POA: Diagnosis not present

## 2022-02-01 DIAGNOSIS — M79671 Pain in right foot: Secondary | ICD-10-CM | POA: Diagnosis not present

## 2022-02-01 DIAGNOSIS — M8008XA Age-related osteoporosis with current pathological fracture, vertebra(e), initial encounter for fracture: Secondary | ICD-10-CM | POA: Diagnosis not present

## 2022-02-01 DIAGNOSIS — M47816 Spondylosis without myelopathy or radiculopathy, lumbar region: Secondary | ICD-10-CM | POA: Diagnosis not present

## 2022-02-01 DIAGNOSIS — Z79899 Other long term (current) drug therapy: Secondary | ICD-10-CM | POA: Diagnosis not present

## 2022-02-01 DIAGNOSIS — Z4789 Encounter for other orthopedic aftercare: Secondary | ICD-10-CM | POA: Diagnosis not present

## 2022-02-01 DIAGNOSIS — S22070A Wedge compression fracture of T9-T10 vertebra, initial encounter for closed fracture: Secondary | ICD-10-CM | POA: Diagnosis not present

## 2022-02-01 DIAGNOSIS — M4800 Spinal stenosis, site unspecified: Secondary | ICD-10-CM | POA: Diagnosis not present

## 2022-02-01 DIAGNOSIS — Z20822 Contact with and (suspected) exposure to covid-19: Secondary | ICD-10-CM | POA: Diagnosis not present

## 2022-02-01 DIAGNOSIS — E785 Hyperlipidemia, unspecified: Secondary | ICD-10-CM | POA: Diagnosis not present

## 2022-02-01 DIAGNOSIS — S22080D Wedge compression fracture of T11-T12 vertebra, subsequent encounter for fracture with routine healing: Secondary | ICD-10-CM | POA: Diagnosis not present

## 2022-02-01 DIAGNOSIS — S32020A Wedge compression fracture of second lumbar vertebra, initial encounter for closed fracture: Secondary | ICD-10-CM | POA: Diagnosis not present

## 2022-02-01 DIAGNOSIS — K219 Gastro-esophageal reflux disease without esophagitis: Secondary | ICD-10-CM | POA: Diagnosis not present

## 2022-02-01 DIAGNOSIS — E860 Dehydration: Secondary | ICD-10-CM | POA: Diagnosis not present

## 2022-02-01 DIAGNOSIS — M545 Low back pain, unspecified: Secondary | ICD-10-CM | POA: Diagnosis not present

## 2022-02-01 DIAGNOSIS — Z736 Limitation of activities due to disability: Secondary | ICD-10-CM | POA: Diagnosis not present

## 2022-02-01 DIAGNOSIS — S92351A Displaced fracture of fifth metatarsal bone, right foot, initial encounter for closed fracture: Secondary | ICD-10-CM | POA: Diagnosis not present

## 2022-02-01 DIAGNOSIS — R278 Other lack of coordination: Secondary | ICD-10-CM | POA: Diagnosis not present

## 2022-02-01 DIAGNOSIS — Z23 Encounter for immunization: Secondary | ICD-10-CM | POA: Diagnosis not present

## 2022-02-01 DIAGNOSIS — I482 Chronic atrial fibrillation, unspecified: Secondary | ICD-10-CM | POA: Diagnosis not present

## 2022-02-01 DIAGNOSIS — M48061 Spinal stenosis, lumbar region without neurogenic claudication: Secondary | ICD-10-CM | POA: Diagnosis not present

## 2022-02-01 DIAGNOSIS — S22080A Wedge compression fracture of T11-T12 vertebra, initial encounter for closed fracture: Secondary | ICD-10-CM | POA: Diagnosis not present

## 2022-02-01 DIAGNOSIS — M6282 Rhabdomyolysis: Secondary | ICD-10-CM | POA: Diagnosis not present

## 2022-02-01 DIAGNOSIS — R079 Chest pain, unspecified: Secondary | ICD-10-CM | POA: Diagnosis not present

## 2022-02-01 DIAGNOSIS — Z9181 History of falling: Secondary | ICD-10-CM | POA: Diagnosis not present

## 2022-02-01 DIAGNOSIS — G8929 Other chronic pain: Secondary | ICD-10-CM | POA: Diagnosis not present

## 2022-02-01 DIAGNOSIS — J90 Pleural effusion, not elsewhere classified: Secondary | ICD-10-CM | POA: Diagnosis not present

## 2022-02-01 DIAGNOSIS — M4804 Spinal stenosis, thoracic region: Secondary | ICD-10-CM | POA: Diagnosis not present

## 2022-02-01 DIAGNOSIS — M6259 Muscle wasting and atrophy, not elsewhere classified, multiple sites: Secondary | ICD-10-CM | POA: Diagnosis not present

## 2022-02-01 DIAGNOSIS — M549 Dorsalgia, unspecified: Secondary | ICD-10-CM | POA: Diagnosis not present

## 2022-02-01 DIAGNOSIS — I4891 Unspecified atrial fibrillation: Secondary | ICD-10-CM | POA: Diagnosis not present

## 2022-02-02 ENCOUNTER — Telehealth: Payer: Self-pay

## 2022-02-02 NOTE — Telephone Encounter (Signed)
Lpmtcb and reschedule INR appointment 

## 2022-02-07 ENCOUNTER — Telehealth: Payer: Self-pay

## 2022-02-07 NOTE — Telephone Encounter (Signed)
Lpmtcb and reschedule INR check in [place]. ?

## 2022-02-09 DIAGNOSIS — M6259 Muscle wasting and atrophy, not elsewhere classified, multiple sites: Secondary | ICD-10-CM | POA: Diagnosis not present

## 2022-02-09 DIAGNOSIS — R296 Repeated falls: Secondary | ICD-10-CM | POA: Diagnosis not present

## 2022-02-09 DIAGNOSIS — S22080A Wedge compression fracture of T11-T12 vertebra, initial encounter for closed fracture: Secondary | ICD-10-CM | POA: Diagnosis not present

## 2022-02-09 DIAGNOSIS — Z9181 History of falling: Secondary | ICD-10-CM | POA: Diagnosis not present

## 2022-02-09 DIAGNOSIS — Z79899 Other long term (current) drug therapy: Secondary | ICD-10-CM | POA: Diagnosis not present

## 2022-02-09 DIAGNOSIS — Z7401 Bed confinement status: Secondary | ICD-10-CM | POA: Diagnosis not present

## 2022-02-09 DIAGNOSIS — M6282 Rhabdomyolysis: Secondary | ICD-10-CM | POA: Diagnosis not present

## 2022-02-09 DIAGNOSIS — E785 Hyperlipidemia, unspecified: Secondary | ICD-10-CM | POA: Diagnosis not present

## 2022-02-09 DIAGNOSIS — S22080D Wedge compression fracture of T11-T12 vertebra, subsequent encounter for fracture with routine healing: Secondary | ICD-10-CM | POA: Diagnosis not present

## 2022-02-09 DIAGNOSIS — Z4789 Encounter for other orthopedic aftercare: Secondary | ICD-10-CM | POA: Diagnosis not present

## 2022-02-09 DIAGNOSIS — S92351A Displaced fracture of fifth metatarsal bone, right foot, initial encounter for closed fracture: Secondary | ICD-10-CM | POA: Diagnosis not present

## 2022-02-09 DIAGNOSIS — W19XXXA Unspecified fall, initial encounter: Secondary | ICD-10-CM | POA: Diagnosis not present

## 2022-02-09 DIAGNOSIS — S92511D Displaced fracture of proximal phalanx of right lesser toe(s), subsequent encounter for fracture with routine healing: Secondary | ICD-10-CM | POA: Diagnosis not present

## 2022-02-09 DIAGNOSIS — S22070A Wedge compression fracture of T9-T10 vertebra, initial encounter for closed fracture: Secondary | ICD-10-CM | POA: Diagnosis not present

## 2022-02-09 DIAGNOSIS — K219 Gastro-esophageal reflux disease without esophagitis: Secondary | ICD-10-CM | POA: Diagnosis not present

## 2022-02-09 DIAGNOSIS — I10 Essential (primary) hypertension: Secondary | ICD-10-CM | POA: Diagnosis not present

## 2022-02-09 DIAGNOSIS — Z20822 Contact with and (suspected) exposure to covid-19: Secondary | ICD-10-CM | POA: Diagnosis not present

## 2022-02-09 DIAGNOSIS — M4800 Spinal stenosis, site unspecified: Secondary | ICD-10-CM | POA: Diagnosis not present

## 2022-02-09 DIAGNOSIS — G8929 Other chronic pain: Secondary | ICD-10-CM | POA: Diagnosis not present

## 2022-02-09 DIAGNOSIS — Z23 Encounter for immunization: Secondary | ICD-10-CM | POA: Diagnosis not present

## 2022-02-09 DIAGNOSIS — I482 Chronic atrial fibrillation, unspecified: Secondary | ICD-10-CM | POA: Diagnosis not present

## 2022-02-09 DIAGNOSIS — R278 Other lack of coordination: Secondary | ICD-10-CM | POA: Diagnosis not present

## 2022-02-09 DIAGNOSIS — Z736 Limitation of activities due to disability: Secondary | ICD-10-CM | POA: Diagnosis not present

## 2022-02-09 DIAGNOSIS — Z7901 Long term (current) use of anticoagulants: Secondary | ICD-10-CM | POA: Diagnosis not present

## 2022-02-11 DIAGNOSIS — I482 Chronic atrial fibrillation, unspecified: Secondary | ICD-10-CM | POA: Diagnosis not present

## 2022-02-11 DIAGNOSIS — S22080D Wedge compression fracture of T11-T12 vertebra, subsequent encounter for fracture with routine healing: Secondary | ICD-10-CM | POA: Diagnosis not present

## 2022-02-11 DIAGNOSIS — M6282 Rhabdomyolysis: Secondary | ICD-10-CM | POA: Diagnosis not present

## 2022-02-11 DIAGNOSIS — S92511D Displaced fracture of proximal phalanx of right lesser toe(s), subsequent encounter for fracture with routine healing: Secondary | ICD-10-CM | POA: Diagnosis not present

## 2022-02-14 ENCOUNTER — Telehealth: Payer: Self-pay

## 2022-02-14 ENCOUNTER — Ambulatory Visit (INDEPENDENT_AMBULATORY_CARE_PROVIDER_SITE_OTHER): Payer: Self-pay | Admitting: Podiatry

## 2022-02-14 DIAGNOSIS — Z91199 Patient's noncompliance with other medical treatment and regimen due to unspecified reason: Secondary | ICD-10-CM

## 2022-02-14 DIAGNOSIS — Z9181 History of falling: Secondary | ICD-10-CM | POA: Diagnosis not present

## 2022-02-14 DIAGNOSIS — S92511D Displaced fracture of proximal phalanx of right lesser toe(s), subsequent encounter for fracture with routine healing: Secondary | ICD-10-CM | POA: Diagnosis not present

## 2022-02-14 DIAGNOSIS — S22080D Wedge compression fracture of T11-T12 vertebra, subsequent encounter for fracture with routine healing: Secondary | ICD-10-CM | POA: Diagnosis not present

## 2022-02-14 DIAGNOSIS — I10 Essential (primary) hypertension: Secondary | ICD-10-CM | POA: Diagnosis not present

## 2022-02-14 NOTE — Progress Notes (Signed)
No show

## 2022-02-14 NOTE — Telephone Encounter (Signed)
Lpmtcb and schedule INR check 

## 2022-02-25 ENCOUNTER — Telehealth: Payer: Self-pay | Admitting: Cardiology

## 2022-02-25 NOTE — Telephone Encounter (Signed)
Patient called to speak with Legrand Como, RN regarding her coumadin appointment. She states that she was in Rehab and missed her appointment but that he can call [facility] and speak with Janett Billow, RN the nurse on duty and find out what her readings were while there. She states she had her coumadin checked yesterday. She would like to know when to start back coming to see the [facility]. Please advise.  ?

## 2022-02-25 NOTE — Telephone Encounter (Signed)
Called rehab facility and spoke with Janett Billow, [place]. Pt 's INR and Warfarin dose at facility is listed below: ?4/7: 1.14 (taking '5mg'$  daily) ?4/11: 1.25 (increased to 7.'5mg'$  daily) ?4/13: 1.46 (decreased to 2.5 daily) ?4/17: 1.80 (increased to '5mg'$  daily) ?4/19: 1.79 (2.5 daily except '5mg'$  Monday, Wednesday and Friday) ?  ?Pt was discharged from rehab and now has home health with [facility].  ? ?- Janett Billow stated Home Health is scheduled to go to pt's home Monday 4/24 to check INR  ? ?[place], no answer. Left message to call back with INR results on Monday.  ? ?Called pt and made her aware INR should be checked Monday and if there are any issues, call Coumadin Clinic. [phone] ?

## 2022-02-28 ENCOUNTER — Ambulatory Visit (INDEPENDENT_AMBULATORY_CARE_PROVIDER_SITE_OTHER): Payer: Medicare Other | Admitting: *Deleted

## 2022-02-28 DIAGNOSIS — M6259 Muscle wasting and atrophy, not elsewhere classified, multiple sites: Secondary | ICD-10-CM | POA: Diagnosis not present

## 2022-02-28 DIAGNOSIS — Z7901 Long term (current) use of anticoagulants: Secondary | ICD-10-CM | POA: Diagnosis not present

## 2022-02-28 DIAGNOSIS — Z5181 Encounter for therapeutic drug level monitoring: Secondary | ICD-10-CM | POA: Diagnosis not present

## 2022-02-28 DIAGNOSIS — Z79891 Long term (current) use of opiate analgesic: Secondary | ICD-10-CM | POA: Diagnosis not present

## 2022-02-28 DIAGNOSIS — N329 Bladder disorder, unspecified: Secondary | ICD-10-CM | POA: Diagnosis not present

## 2022-02-28 DIAGNOSIS — S22080D Wedge compression fracture of T11-T12 vertebra, subsequent encounter for fracture with routine healing: Secondary | ICD-10-CM | POA: Diagnosis not present

## 2022-02-28 DIAGNOSIS — I10 Essential (primary) hypertension: Secondary | ICD-10-CM | POA: Diagnosis not present

## 2022-02-28 DIAGNOSIS — G8929 Other chronic pain: Secondary | ICD-10-CM | POA: Diagnosis not present

## 2022-02-28 DIAGNOSIS — S92511D Displaced fracture of proximal phalanx of right lesser toe(s), subsequent encounter for fracture with routine healing: Secondary | ICD-10-CM | POA: Diagnosis not present

## 2022-02-28 DIAGNOSIS — Z9181 History of falling: Secondary | ICD-10-CM | POA: Diagnosis not present

## 2022-02-28 DIAGNOSIS — E785 Hyperlipidemia, unspecified: Secondary | ICD-10-CM | POA: Diagnosis not present

## 2022-02-28 DIAGNOSIS — K219 Gastro-esophageal reflux disease without esophagitis: Secondary | ICD-10-CM | POA: Diagnosis not present

## 2022-02-28 DIAGNOSIS — I482 Chronic atrial fibrillation, unspecified: Secondary | ICD-10-CM | POA: Diagnosis not present

## 2022-02-28 DIAGNOSIS — Z96643 Presence of artificial hip joint, bilateral: Secondary | ICD-10-CM | POA: Diagnosis not present

## 2022-02-28 DIAGNOSIS — D519 Vitamin B12 deficiency anemia, unspecified: Secondary | ICD-10-CM | POA: Diagnosis not present

## 2022-02-28 LAB — POCT INR: INR: 3.5 — AB (ref 2.0–3.0)

## 2022-02-28 NOTE — Patient Instructions (Signed)
Description   ?Spoke with Estill Bamberg with [facility] and instructed to hold today's dose then continue taking warfarin 1/2 tablet daily. Stay consistent with greens (3 times per week)  ?Repeat INR in 1 week. Coumadin Clinic: [phone] ?  ?  ?

## 2022-03-01 ENCOUNTER — Telehealth: Payer: Self-pay

## 2022-03-01 NOTE — Telephone Encounter (Signed)
Patient was reached by nurse of  coumadin clinic about the results INR. There is a note from [place], Therapist, sports. ?

## 2022-03-02 ENCOUNTER — Ambulatory Visit (INDEPENDENT_AMBULATORY_CARE_PROVIDER_SITE_OTHER): Payer: Medicare Other

## 2022-03-02 DIAGNOSIS — E785 Hyperlipidemia, unspecified: Secondary | ICD-10-CM | POA: Diagnosis not present

## 2022-03-02 DIAGNOSIS — Z7901 Long term (current) use of anticoagulants: Secondary | ICD-10-CM | POA: Diagnosis not present

## 2022-03-02 DIAGNOSIS — Z0000 Encounter for general adult medical examination without abnormal findings: Secondary | ICD-10-CM

## 2022-03-02 DIAGNOSIS — Z9181 History of falling: Secondary | ICD-10-CM | POA: Diagnosis not present

## 2022-03-02 DIAGNOSIS — I10 Essential (primary) hypertension: Secondary | ICD-10-CM | POA: Diagnosis not present

## 2022-03-02 DIAGNOSIS — M6259 Muscle wasting and atrophy, not elsewhere classified, multiple sites: Secondary | ICD-10-CM | POA: Diagnosis not present

## 2022-03-02 DIAGNOSIS — N329 Bladder disorder, unspecified: Secondary | ICD-10-CM | POA: Diagnosis not present

## 2022-03-02 DIAGNOSIS — S92511D Displaced fracture of proximal phalanx of right lesser toe(s), subsequent encounter for fracture with routine healing: Secondary | ICD-10-CM | POA: Diagnosis not present

## 2022-03-02 DIAGNOSIS — D519 Vitamin B12 deficiency anemia, unspecified: Secondary | ICD-10-CM | POA: Diagnosis not present

## 2022-03-02 DIAGNOSIS — Z79891 Long term (current) use of opiate analgesic: Secondary | ICD-10-CM | POA: Diagnosis not present

## 2022-03-02 DIAGNOSIS — I482 Chronic atrial fibrillation, unspecified: Secondary | ICD-10-CM | POA: Diagnosis not present

## 2022-03-02 DIAGNOSIS — K219 Gastro-esophageal reflux disease without esophagitis: Secondary | ICD-10-CM | POA: Diagnosis not present

## 2022-03-02 DIAGNOSIS — Z96643 Presence of artificial hip joint, bilateral: Secondary | ICD-10-CM | POA: Diagnosis not present

## 2022-03-02 DIAGNOSIS — S22080D Wedge compression fracture of T11-T12 vertebra, subsequent encounter for fracture with routine healing: Secondary | ICD-10-CM | POA: Diagnosis not present

## 2022-03-02 DIAGNOSIS — G8929 Other chronic pain: Secondary | ICD-10-CM | POA: Diagnosis not present

## 2022-03-02 NOTE — Telephone Encounter (Signed)
Patient called this morning stating that she was returning a missed call. Patient was notified that it does not appear that anyone from our office has called. Patient was notified that a nurse from the coumadin clinic called her with her results. When asked if she has received the results, she stated no one has spoke with her. Dr. Henrene Pastor please review this patients INR results and have a nurse call her back with the results.  ?

## 2022-03-02 NOTE — Progress Notes (Signed)
? ?Subjective:  ? [name] is a 88 y.o. female who presents for Medicare Annual (Subsequent) preventive examination.   ? ?I connected with  [name] on 03/02/22 by a audio enabled telemedicine application and verified that I am speaking with the correct person using two identifiers. ? ?Patient Location: Home ? ?Provider Location: Office/Clinic ? ?I discussed the limitations of evaluation and management by telemedicine. The patient expressed understanding and agreed to proceed. ? ? ?Cardiac Risk Factors include: advanced age (>85mn, >>66women);dyslipidemia;hypertension;sedentary lifestyle ? ?   ?Objective:  ?  ?Today's Vitals  ? 03/02/22 2003  ?PainSc: 3   ? ?There is no height or weight on file to calculate BMI. ? ? ?  03/02/2022  ?  8:14 PM 05/13/2020  ? 11:20 AM 04/29/2020  ?  9:14 AM 05/08/2019  ? 11:50 AM 03/01/2013  ? 10:34 AM 02/27/2013  ?  1:29 PM 02/28/2012  ? 11:21 AM  ?Advanced Directives  ?Does Patient Have a Medical Advance Directive? Yes Yes Yes Yes Patient has advance directive, copy not in chart Patient has advance directive, copy not in chart Patient has advance directive, copy not in chart  ?Type of ACorporate treasurerof AWilliamsonLiving will Healthcare Power of AKennedyLiving will HRalstonLiving will HCape MayLiving will Living will;Healthcare Power of Attorney  ?Does patient want to make changes to medical advance directive? No - Patient declined        ?Copy of [place]in Chart?  No - copy requested No - copy requested No - copy requested Copy requested from family    ?Pre-existing out of facility DNR order (yellow form or pink MOST form)     No    ? ? ?Current Medications (verified) ?Outpatient Encounter Medications as of 03/02/2022  ?Medication Sig  ? ascorbic acid (VITAMIN C) 500 MG tablet Take 600 mg by mouth daily.   ? atorvastatin (LIPITOR) 80 MG tablet Take 1 tablet (80 mg  total) by mouth daily.  ? Biotin 1000 MCG tablet Take 1 tablet by mouth daily.   ? Calcium Carbonate-Vitamin D (CALCIUM + D PO) Take by mouth daily.  ? Cholecalciferol (VITAMIN D PO) Take by mouth daily.  ? cyanocobalamin (,VITAMIN B-12,) 1000 MCG/ML injection Inject 1 mL (1,000 mcg total) into the muscle every 30 (thirty) days.  ? diltiazem (TIAZAC) 120 MG 24 hr capsule TAKE ONE CAPSULE BY MOUTH DAILY  ? HYDROcodone-acetaminophen (NORCO) 10-325 MG per tablet Take 1 tablet by mouth every 6 (six) hours as needed for moderate pain.   ? lisinopril (ZESTRIL) 20 MG tablet Take 1 tablet (20 mg total) by mouth daily.  ? oxybutynin (DITROPAN) 5 MG tablet Take 1 tablet (5 mg total) by mouth 3 (three) times daily.  ? pantoprazole (PROTONIX) 40 MG tablet Take 40 mg by mouth daily.  ? potassium chloride SA (KLOR-CON) 20 MEQ tablet TAKE ONE TABLET BY MOUTH TWICE DAILY  ? sertraline (ZOLOFT) 50 MG tablet Take 75 mg by mouth daily.   ? vitamin A 8000 UNIT capsule Take 8,000 Units by mouth daily.  ? warfarin (COUMADIN) 5 MG tablet Take 0.5-1 tablets (2.5-5 mg total) by mouth See admin instructions. 5 mg on Mondays and 2.5 mg all other days  ? ?No facility-administered encounter medications on file as of 03/02/2022.  ? ? ?Allergies (verified) ?Sulfanilamide and Penicillins  ? ?History: ?Past Medical History:  ?Diagnosis Date  ? Arthritis   ?  Back pain, chronic   ? Coronary artery disease CARDIOLOGIST- DR [name]--  LOV IN EPIC  ? Depression   ? Hammer toe   ? Hypercholesterolemia   ? Hypertensive heart disease   ? WITH LVH  ? LVH (left ventricular hypertrophy) due to hypertensive disease   ? Mitral insufficiency   ? CHRONIC  ? Pulmonary hypertension ([place])   ? Transfusion history   ? ?Past Surgical History:  ?Procedure Laterality Date  ? ABDOMINAL AORTOGRAM W/LOWER EXTREMITY N/A 04/29/2020  ? Procedure: ABDOMINAL AORTOGRAM W/LOWER EXTREMITY;  Surgeon: [name], MD;  Location: [facility];  Service: Cardiovascular;   Laterality: N/A;  Lt Leg  ? ABDOMINAL HYSTERECTOMY    ? ANTERIOR REPAIR/ SPARC PUBOVAGINAL SLING  05-03-2002  ? BUNIONECTOMY WITH HAMMERTOE RECONSTRUCTION Right 03/01/2013  ? Procedure: RIGHT FOOT EXCISION BUNIONETTE AND FUSION OF DIP FOURTH TOE;  Surgeon: [name], MD;  Location: [place];  Service: Orthopedics;  Laterality: Right;  ? CARDIAC CATHETERIZATION  02-03-2009  DR  [name]  ? SINGLE-VESSEL OBSTRUCTIVE CAD, SMALL DIAGONAL BRANCH/ NORMAL LVF/ SEVERE MITRAL INSUFFICIENCY/ SEVERE PULMONARY HYPERTENSION  ? CATARACT EXTRACTION W/ INTRAOCULAR LENS  IMPLANT, BILATERAL  2003  ? COLONOSCOPY  07/25/2014  ? few mall scattered diverticula in the ectire examined colon. Small internal hemmorhoids.   ? HAMMER TOE SURGERY  02/28/2012  ? Procedure: HAMMER TOE CORRECTION;  Surgeon: [name], MD;  Location: [place];  Service: Orthopedics;  Laterality: Right;  FUSION OF PROXIMAL INTERPHALANGEAL  RIGHT THIRD CLAW TOE  ? PARS PLANA VITRECTOMY W/ REPAIR OF MACULAR HOLE  02-04-2002  ? LEFT EYE  ? PERIPHERAL VASCULAR BALLOON ANGIOPLASTY  04/29/2020  ? Procedure: PERIPHERAL VASCULAR BALLOON ANGIOPLASTY;  Surgeon: [name], MD;  Location: [facility] [facility];  Service: Cardiovascular;;  Lt. AT  ? REPAIR RIGHT SECOND CLAW TOE/ VARUS MEDIAL CLOSING WEDGE OSTEOTOMY PROXIMAL PHALANX RIGHT GREAT TOE  05-05-2005  ? TOTAL HIP ARTHROPLASTY    ? BILATERAL---  LEFT 4/98;   RIGHT  11/98  ? VARUS OSTEOTOMY PROXIMAL PHALANX, LEFT GREAT TOE/ PARING DOWN THE CALLUS , SECOND LEFT TOE  10-27-2008  ? ?Family History  ?Problem Relation Age of Onset  ? Heart disease Mother 30  ? Prostate cancer Father 76  ? ?Social History  ? ?Socioeconomic History  ? Marital status: Divorced  ?  Spouse name: Not on file  ? Number of children: 3  ? Years of education: College  ? Highest education level: Not on file  ?Occupational History  ? Occupation: Retired  ?  Employer: RETIRED  ?Tobacco Use  ? Smoking  status: Never  ? Smokeless tobacco: Never  ?Vaping Use  ? Vaping Use: Never used  ?Substance and Sexual Activity  ? Alcohol use: Not Currently  ?  Comment: occasional  ? Drug use: Yes  ?  Types: Hydrocodone  ? Sexual activity: Not Currently  ?Other Topics Concern  ? Not on file  ?Social History Narrative  ? Patient lives at home alone.  ? Caffeine Use: 1 cup daily occasionally  ? ?Social Determinants of Health  ? ?Financial Resource Strain: Low Risk   ? Difficulty of Paying Living Expenses: Not hard at all  ?Food Insecurity: No Food Insecurity  ? Worried About Charity fundraiser in the Last Year: Never true  ? Ran Out of Food in the Last Year: Never true  ?Transportation Needs: Unmet Transportation Needs  ? Lack of Transportation (Medical):  Yes  ? Lack of Transportation (Non-Medical): No  ?Physical Activity: Inactive  ? Days of Exercise per Week: 0 days  ? Minutes of Exercise per Session: 0 min  ?Stress: Stress Concern Present  ? Feeling of Stress : To some extent  ?Social Connections: Not on file  ? ? ?Tobacco Counseling ?Counseling given: Not Answered ? ? ?Clinical Intake: ? ?Pre-visit preparation completed: Yes ? ?Pain : 0-10 ?Pain Score: 3  ?Pain Type: Chronic pain ?Pain Location: Back ?Pain Frequency: Constant ? ?  ? ?  ? ?How often do you need to have someone help you when you read instructions, pamphlets, or other written materials from your doctor or pharmacy?: 3 - Sometimes ?Interpreter Needed?: No ?  ? ?Activities of Daily Living ? ?  03/02/2022  ?  8:16 PM 05/19/2021  ? 10:40 AM  ?In your present state of health, do you have any difficulty performing the following activities:  ?Hearing? 0 0  ?Vision? 0 0  ?Difficulty concentrating or making decisions? 1 0  ?Walking or climbing stairs? 1 0  ?Dressing or bathing? 1 0  ?Doing errands, shopping? 1 0  ?Preparing Food and eating ? N   ?Using the Toilet? N   ?In the past six months, have you accidently leaked urine? Y   ?Do you have problems with loss of bowel  control? N   ?Managing your Medications? N   ?Managing your Finances? N   ?Housekeeping or managing your Housekeeping? Y   ? ? ?Patient Care Team: ?[name], MD as PCP - General (Family Medicine) ?

## 2022-03-02 NOTE — Patient Instructions (Signed)

## 2022-03-02 NOTE — Telephone Encounter (Signed)
I called and let her know about the results and instructions. She said that she is doing the instructions.  ?

## 2022-03-03 DIAGNOSIS — K219 Gastro-esophageal reflux disease without esophagitis: Secondary | ICD-10-CM | POA: Diagnosis not present

## 2022-03-03 DIAGNOSIS — I482 Chronic atrial fibrillation, unspecified: Secondary | ICD-10-CM | POA: Diagnosis not present

## 2022-03-03 DIAGNOSIS — I10 Essential (primary) hypertension: Secondary | ICD-10-CM | POA: Diagnosis not present

## 2022-03-03 DIAGNOSIS — S92511D Displaced fracture of proximal phalanx of right lesser toe(s), subsequent encounter for fracture with routine healing: Secondary | ICD-10-CM | POA: Diagnosis not present

## 2022-03-03 DIAGNOSIS — Z79891 Long term (current) use of opiate analgesic: Secondary | ICD-10-CM | POA: Diagnosis not present

## 2022-03-03 DIAGNOSIS — M6259 Muscle wasting and atrophy, not elsewhere classified, multiple sites: Secondary | ICD-10-CM | POA: Diagnosis not present

## 2022-03-03 DIAGNOSIS — S22080D Wedge compression fracture of T11-T12 vertebra, subsequent encounter for fracture with routine healing: Secondary | ICD-10-CM | POA: Diagnosis not present

## 2022-03-03 DIAGNOSIS — Z7901 Long term (current) use of anticoagulants: Secondary | ICD-10-CM | POA: Diagnosis not present

## 2022-03-03 DIAGNOSIS — Z9181 History of falling: Secondary | ICD-10-CM | POA: Diagnosis not present

## 2022-03-03 DIAGNOSIS — Z96643 Presence of artificial hip joint, bilateral: Secondary | ICD-10-CM | POA: Diagnosis not present

## 2022-03-03 DIAGNOSIS — N329 Bladder disorder, unspecified: Secondary | ICD-10-CM | POA: Diagnosis not present

## 2022-03-03 DIAGNOSIS — D519 Vitamin B12 deficiency anemia, unspecified: Secondary | ICD-10-CM | POA: Diagnosis not present

## 2022-03-03 DIAGNOSIS — G8929 Other chronic pain: Secondary | ICD-10-CM | POA: Diagnosis not present

## 2022-03-03 DIAGNOSIS — E785 Hyperlipidemia, unspecified: Secondary | ICD-10-CM | POA: Diagnosis not present

## 2022-03-07 DIAGNOSIS — S22080D Wedge compression fracture of T11-T12 vertebra, subsequent encounter for fracture with routine healing: Secondary | ICD-10-CM | POA: Diagnosis not present

## 2022-03-07 DIAGNOSIS — Z7901 Long term (current) use of anticoagulants: Secondary | ICD-10-CM | POA: Diagnosis not present

## 2022-03-07 DIAGNOSIS — E785 Hyperlipidemia, unspecified: Secondary | ICD-10-CM | POA: Diagnosis not present

## 2022-03-07 DIAGNOSIS — M6259 Muscle wasting and atrophy, not elsewhere classified, multiple sites: Secondary | ICD-10-CM | POA: Diagnosis not present

## 2022-03-07 DIAGNOSIS — G8929 Other chronic pain: Secondary | ICD-10-CM | POA: Diagnosis not present

## 2022-03-07 DIAGNOSIS — Z96643 Presence of artificial hip joint, bilateral: Secondary | ICD-10-CM | POA: Diagnosis not present

## 2022-03-07 DIAGNOSIS — Z9181 History of falling: Secondary | ICD-10-CM | POA: Diagnosis not present

## 2022-03-07 DIAGNOSIS — K219 Gastro-esophageal reflux disease without esophagitis: Secondary | ICD-10-CM | POA: Diagnosis not present

## 2022-03-07 DIAGNOSIS — I482 Chronic atrial fibrillation, unspecified: Secondary | ICD-10-CM | POA: Diagnosis not present

## 2022-03-07 DIAGNOSIS — N329 Bladder disorder, unspecified: Secondary | ICD-10-CM | POA: Diagnosis not present

## 2022-03-07 DIAGNOSIS — I10 Essential (primary) hypertension: Secondary | ICD-10-CM | POA: Diagnosis not present

## 2022-03-07 DIAGNOSIS — S92511D Displaced fracture of proximal phalanx of right lesser toe(s), subsequent encounter for fracture with routine healing: Secondary | ICD-10-CM | POA: Diagnosis not present

## 2022-03-07 DIAGNOSIS — D519 Vitamin B12 deficiency anemia, unspecified: Secondary | ICD-10-CM | POA: Diagnosis not present

## 2022-03-07 DIAGNOSIS — Z79891 Long term (current) use of opiate analgesic: Secondary | ICD-10-CM | POA: Diagnosis not present

## 2022-03-08 ENCOUNTER — Telehealth: Payer: Self-pay

## 2022-03-08 ENCOUNTER — Encounter: Payer: Self-pay | Admitting: Legal Medicine

## 2022-03-08 ENCOUNTER — Ambulatory Visit (INDEPENDENT_AMBULATORY_CARE_PROVIDER_SITE_OTHER): Payer: Medicare Other | Admitting: *Deleted

## 2022-03-08 DIAGNOSIS — M6259 Muscle wasting and atrophy, not elsewhere classified, multiple sites: Secondary | ICD-10-CM | POA: Diagnosis not present

## 2022-03-08 DIAGNOSIS — Z9181 History of falling: Secondary | ICD-10-CM | POA: Diagnosis not present

## 2022-03-08 DIAGNOSIS — S22080D Wedge compression fracture of T11-T12 vertebra, subsequent encounter for fracture with routine healing: Secondary | ICD-10-CM | POA: Diagnosis not present

## 2022-03-08 DIAGNOSIS — N329 Bladder disorder, unspecified: Secondary | ICD-10-CM | POA: Diagnosis not present

## 2022-03-08 DIAGNOSIS — G8929 Other chronic pain: Secondary | ICD-10-CM | POA: Diagnosis not present

## 2022-03-08 DIAGNOSIS — Z79891 Long term (current) use of opiate analgesic: Secondary | ICD-10-CM | POA: Diagnosis not present

## 2022-03-08 DIAGNOSIS — E785 Hyperlipidemia, unspecified: Secondary | ICD-10-CM | POA: Diagnosis not present

## 2022-03-08 DIAGNOSIS — Z96643 Presence of artificial hip joint, bilateral: Secondary | ICD-10-CM | POA: Diagnosis not present

## 2022-03-08 DIAGNOSIS — I10 Essential (primary) hypertension: Secondary | ICD-10-CM | POA: Diagnosis not present

## 2022-03-08 DIAGNOSIS — D519 Vitamin B12 deficiency anemia, unspecified: Secondary | ICD-10-CM | POA: Diagnosis not present

## 2022-03-08 DIAGNOSIS — I482 Chronic atrial fibrillation, unspecified: Secondary | ICD-10-CM | POA: Diagnosis not present

## 2022-03-08 DIAGNOSIS — K219 Gastro-esophageal reflux disease without esophagitis: Secondary | ICD-10-CM | POA: Diagnosis not present

## 2022-03-08 DIAGNOSIS — Z7901 Long term (current) use of anticoagulants: Secondary | ICD-10-CM | POA: Diagnosis not present

## 2022-03-08 DIAGNOSIS — S92511D Displaced fracture of proximal phalanx of right lesser toe(s), subsequent encounter for fracture with routine healing: Secondary | ICD-10-CM | POA: Diagnosis not present

## 2022-03-08 LAB — POCT INR: INR: 4.1 — AB (ref <=1.20)

## 2022-03-08 MED ORDER — WARFARIN SODIUM 2.5 MG PO TABS: ORAL_TABLET | ORAL | 1 refills | Status: DC

## 2022-03-08 NOTE — Telephone Encounter (Signed)
Hold coumadin for now, coumadin clinic should redose ?lp ?

## 2022-03-08 NOTE — Telephone Encounter (Signed)
Patient was informed. She will hold on coumadin and she will call coumadin clinic tomorrow if she did not hear anything today. I sent the results to Dr Peter [name]. ?

## 2022-03-08 NOTE — Telephone Encounter (Signed)
[name] with [facility] called to report patient fell last night in her bedroom, patient called EMS to help her get up. She did not hit her head or have any injuries she only could not get up on her own. Also stated patients PT/INR was 4.1 and results have already been sent to the coumadin clinic. ?

## 2022-03-08 NOTE — Patient Instructions (Signed)
Description   ?03/08/2022-NEW TABLET ORDERED, D/C'D '5MG'$  TABLET SENT IN 2.'5MG'$  TABLET. Spoke with Estill Bamberg with [facility] and instructed to hold today's dose of warfarin then change her dose to warfarin 2.'5mg'$  daily except 1.'25mg'$  on Mondays, Wednesdays, and Fridays. Stay consistent with greens (3 times per week). Repeat INR in 1 week. Coumadin Clinic: [phone] ?  ?  ?

## 2022-03-10 DIAGNOSIS — S22080D Wedge compression fracture of T11-T12 vertebra, subsequent encounter for fracture with routine healing: Secondary | ICD-10-CM | POA: Diagnosis not present

## 2022-03-10 DIAGNOSIS — D519 Vitamin B12 deficiency anemia, unspecified: Secondary | ICD-10-CM | POA: Diagnosis not present

## 2022-03-10 DIAGNOSIS — G8929 Other chronic pain: Secondary | ICD-10-CM | POA: Diagnosis not present

## 2022-03-10 DIAGNOSIS — E785 Hyperlipidemia, unspecified: Secondary | ICD-10-CM | POA: Diagnosis not present

## 2022-03-10 DIAGNOSIS — I10 Essential (primary) hypertension: Secondary | ICD-10-CM | POA: Diagnosis not present

## 2022-03-10 DIAGNOSIS — Z9181 History of falling: Secondary | ICD-10-CM | POA: Diagnosis not present

## 2022-03-10 DIAGNOSIS — I482 Chronic atrial fibrillation, unspecified: Secondary | ICD-10-CM | POA: Diagnosis not present

## 2022-03-10 DIAGNOSIS — S92511D Displaced fracture of proximal phalanx of right lesser toe(s), subsequent encounter for fracture with routine healing: Secondary | ICD-10-CM | POA: Diagnosis not present

## 2022-03-10 DIAGNOSIS — Z7901 Long term (current) use of anticoagulants: Secondary | ICD-10-CM | POA: Diagnosis not present

## 2022-03-10 DIAGNOSIS — N329 Bladder disorder, unspecified: Secondary | ICD-10-CM | POA: Diagnosis not present

## 2022-03-10 DIAGNOSIS — Z96643 Presence of artificial hip joint, bilateral: Secondary | ICD-10-CM | POA: Diagnosis not present

## 2022-03-10 DIAGNOSIS — M6259 Muscle wasting and atrophy, not elsewhere classified, multiple sites: Secondary | ICD-10-CM | POA: Diagnosis not present

## 2022-03-10 DIAGNOSIS — K219 Gastro-esophageal reflux disease without esophagitis: Secondary | ICD-10-CM | POA: Diagnosis not present

## 2022-03-10 DIAGNOSIS — Z79891 Long term (current) use of opiate analgesic: Secondary | ICD-10-CM | POA: Diagnosis not present

## 2022-03-11 ENCOUNTER — Other Ambulatory Visit: Payer: Medicare Other

## 2022-03-11 DIAGNOSIS — K219 Gastro-esophageal reflux disease without esophagitis: Secondary | ICD-10-CM | POA: Diagnosis not present

## 2022-03-11 DIAGNOSIS — Z79891 Long term (current) use of opiate analgesic: Secondary | ICD-10-CM | POA: Diagnosis not present

## 2022-03-11 DIAGNOSIS — Z9181 History of falling: Secondary | ICD-10-CM | POA: Diagnosis not present

## 2022-03-11 DIAGNOSIS — G8929 Other chronic pain: Secondary | ICD-10-CM | POA: Diagnosis not present

## 2022-03-11 DIAGNOSIS — I10 Essential (primary) hypertension: Secondary | ICD-10-CM | POA: Diagnosis not present

## 2022-03-11 DIAGNOSIS — D519 Vitamin B12 deficiency anemia, unspecified: Secondary | ICD-10-CM | POA: Diagnosis not present

## 2022-03-11 DIAGNOSIS — N329 Bladder disorder, unspecified: Secondary | ICD-10-CM | POA: Diagnosis not present

## 2022-03-11 DIAGNOSIS — E785 Hyperlipidemia, unspecified: Secondary | ICD-10-CM | POA: Diagnosis not present

## 2022-03-11 DIAGNOSIS — Z96643 Presence of artificial hip joint, bilateral: Secondary | ICD-10-CM | POA: Diagnosis not present

## 2022-03-11 DIAGNOSIS — S92511D Displaced fracture of proximal phalanx of right lesser toe(s), subsequent encounter for fracture with routine healing: Secondary | ICD-10-CM | POA: Diagnosis not present

## 2022-03-11 DIAGNOSIS — Z7901 Long term (current) use of anticoagulants: Secondary | ICD-10-CM | POA: Diagnosis not present

## 2022-03-11 DIAGNOSIS — M6259 Muscle wasting and atrophy, not elsewhere classified, multiple sites: Secondary | ICD-10-CM | POA: Diagnosis not present

## 2022-03-11 DIAGNOSIS — I482 Chronic atrial fibrillation, unspecified: Secondary | ICD-10-CM | POA: Diagnosis not present

## 2022-03-11 DIAGNOSIS — S22080D Wedge compression fracture of T11-T12 vertebra, subsequent encounter for fracture with routine healing: Secondary | ICD-10-CM | POA: Diagnosis not present

## 2022-03-14 DIAGNOSIS — N329 Bladder disorder, unspecified: Secondary | ICD-10-CM | POA: Diagnosis not present

## 2022-03-14 DIAGNOSIS — D519 Vitamin B12 deficiency anemia, unspecified: Secondary | ICD-10-CM | POA: Diagnosis not present

## 2022-03-14 DIAGNOSIS — S92511D Displaced fracture of proximal phalanx of right lesser toe(s), subsequent encounter for fracture with routine healing: Secondary | ICD-10-CM | POA: Diagnosis not present

## 2022-03-14 DIAGNOSIS — I482 Chronic atrial fibrillation, unspecified: Secondary | ICD-10-CM | POA: Diagnosis not present

## 2022-03-14 DIAGNOSIS — Z7901 Long term (current) use of anticoagulants: Secondary | ICD-10-CM | POA: Diagnosis not present

## 2022-03-14 DIAGNOSIS — K219 Gastro-esophageal reflux disease without esophagitis: Secondary | ICD-10-CM | POA: Diagnosis not present

## 2022-03-14 DIAGNOSIS — E785 Hyperlipidemia, unspecified: Secondary | ICD-10-CM | POA: Diagnosis not present

## 2022-03-14 DIAGNOSIS — G8929 Other chronic pain: Secondary | ICD-10-CM | POA: Diagnosis not present

## 2022-03-14 DIAGNOSIS — Z79891 Long term (current) use of opiate analgesic: Secondary | ICD-10-CM | POA: Diagnosis not present

## 2022-03-14 DIAGNOSIS — Z96643 Presence of artificial hip joint, bilateral: Secondary | ICD-10-CM | POA: Diagnosis not present

## 2022-03-14 DIAGNOSIS — M6259 Muscle wasting and atrophy, not elsewhere classified, multiple sites: Secondary | ICD-10-CM | POA: Diagnosis not present

## 2022-03-14 DIAGNOSIS — I10 Essential (primary) hypertension: Secondary | ICD-10-CM | POA: Diagnosis not present

## 2022-03-14 DIAGNOSIS — Z9181 History of falling: Secondary | ICD-10-CM | POA: Diagnosis not present

## 2022-03-14 DIAGNOSIS — S22080D Wedge compression fracture of T11-T12 vertebra, subsequent encounter for fracture with routine healing: Secondary | ICD-10-CM | POA: Diagnosis not present

## 2022-03-15 ENCOUNTER — Ambulatory Visit (INDEPENDENT_AMBULATORY_CARE_PROVIDER_SITE_OTHER): Payer: Medicare Other | Admitting: Legal Medicine

## 2022-03-15 ENCOUNTER — Telehealth: Payer: Self-pay | Admitting: Cardiology

## 2022-03-15 ENCOUNTER — Encounter: Payer: Self-pay | Admitting: Legal Medicine

## 2022-03-15 VITALS — BP 110/60 | HR 120 | Temp 98.8°F | Resp 15 | Ht 64.0 in | Wt 147.0 lb

## 2022-03-15 DIAGNOSIS — E559 Vitamin D deficiency, unspecified: Secondary | ICD-10-CM

## 2022-03-15 DIAGNOSIS — J449 Chronic obstructive pulmonary disease, unspecified: Secondary | ICD-10-CM

## 2022-03-15 DIAGNOSIS — Z7901 Long term (current) use of anticoagulants: Secondary | ICD-10-CM

## 2022-03-15 DIAGNOSIS — N3941 Urge incontinence: Secondary | ICD-10-CM

## 2022-03-15 DIAGNOSIS — I340 Nonrheumatic mitral (valve) insufficiency: Secondary | ICD-10-CM

## 2022-03-15 DIAGNOSIS — I4811 Longstanding persistent atrial fibrillation: Secondary | ICD-10-CM

## 2022-03-15 DIAGNOSIS — I10 Essential (primary) hypertension: Secondary | ICD-10-CM

## 2022-03-15 DIAGNOSIS — M8588 Other specified disorders of bone density and structure, other site: Secondary | ICD-10-CM | POA: Diagnosis not present

## 2022-03-15 DIAGNOSIS — I272 Pulmonary hypertension, unspecified: Secondary | ICD-10-CM

## 2022-03-15 DIAGNOSIS — R7303 Prediabetes: Secondary | ICD-10-CM

## 2022-03-15 DIAGNOSIS — R29898 Other symptoms and signs involving the musculoskeletal system: Secondary | ICD-10-CM | POA: Diagnosis not present

## 2022-03-15 DIAGNOSIS — E782 Mixed hyperlipidemia: Secondary | ICD-10-CM

## 2022-03-15 DIAGNOSIS — D6869 Other thrombophilia: Secondary | ICD-10-CM

## 2022-03-15 DIAGNOSIS — E1142 Type 2 diabetes mellitus with diabetic polyneuropathy: Secondary | ICD-10-CM

## 2022-03-15 NOTE — Progress Notes (Signed)
? ?Subjective:  ?Patient ID: [name], female    DOB: [birth year]  Age: 88 y.o. MRN: [id number] ? ?Chief Complaint  ?Patient presents with  ? Prediabetes  ? Hyperlipidemia  ? Atrial Fibrillation  ? ? ?HPI: chronic visit ? ?Afib: Patient is taking warfarin 2.5 mg except M-W-F 1.25 mg.  ?Chronic and stable. ? ?Hypertension: She takes lisinopril 20 mg daily, Diltiazem 120 mg daily. ?Patient presents for follow up of hypertension.  Patient tolerating lisinopril and diltiazem well with side effects.  Patient was diagnosed with hypertension 2010 so has been treated for hypertension for 12 years.Patient is working on maintaining diet and exercise regimen and follows up as directed. Complication include no e.  ?Depression: Taking sertraline 50 mg daily. ? ?GERD: Currently on Pantoprazole 40 mg daily. Chronic medicines ? ?Chronic back pain seen by dr. Nelva Bush who has her on hydrocodone, she had kyphoplasty in April.  She had 20 days of rehabillitaion and  now live alone. ?Current Outpatient Medications on File Prior to Visit  ?Medication Sig Dispense Refill  ? ascorbic acid (VITAMIN C) 500 MG tablet Take 600 mg by mouth daily.     ? Biotin 1000 MCG tablet Take 1 tablet by mouth daily.     ? Calcium Carbonate-Vitamin D (CALCIUM + D PO) Take by mouth daily.    ? Cholecalciferol (VITAMIN D PO) Take by mouth daily.    ? cyanocobalamin (,VITAMIN B-12,) 1000 MCG/ML injection Inject 1 mL (1,000 mcg total) into the muscle every 30 (thirty) days. 10 mL 3  ? diltiazem (CARDIZEM CD) 120 MG 24 hr capsule Take 120 mg by mouth daily.    ? HYDROcodone-acetaminophen (NORCO) 10-325 MG per tablet Take 1 tablet by mouth every 6 (six) hours as needed for moderate pain.     ? lidocaine (LIDODERM) 5 % 1 patch daily.    ? lisinopril (ZESTRIL) 20 MG tablet Take 1 tablet (20 mg total) by mouth daily. 90 tablet 2  ? oxybutynin (DITROPAN) 5 MG tablet Take 1 tablet (5 mg total) by mouth 3 (three) times daily. 90 tablet 2  ? pantoprazole (PROTONIX)  40 MG tablet Take 40 mg by mouth daily.    ? potassium chloride SA (KLOR-CON) 20 MEQ tablet TAKE ONE TABLET BY MOUTH TWICE DAILY 180 tablet 3  ? predniSONE (DELTASONE) 10 MG tablet Take by mouth.    ? sertraline (ZOLOFT) 50 MG tablet Take 75 mg by mouth daily.     ? vitamin A 8000 UNIT capsule Take 8,000 Units by mouth daily.    ? warfarin (COUMADIN) 2.5 MG tablet Take 1/2 tablet to 1 tablet daily by mouth or as directed by Anticoagulation Clinic. 30 tablet 1  ? atorvastatin (LIPITOR) 80 MG tablet Take 1 tablet (80 mg total) by mouth daily. 90 tablet 3  ? ?No current facility-administered medications on file prior to visit.  ? ?Past Medical History:  ?Diagnosis Date  ? Arthritis   ? Back pain, chronic   ? Coronary artery disease CARDIOLOGIST- DR [name]--  LOV IN EPIC  ? Depression   ? Hammer toe   ? Hypercholesterolemia   ? Hypertensive heart disease   ? WITH LVH  ? LVH (left ventricular hypertrophy) due to hypertensive disease   ? Mitral insufficiency   ? CHRONIC  ? Pulmonary hypertension ([place])   ? Transfusion history   ? ?Past Surgical History:  ?Procedure Laterality Date  ? ABDOMINAL AORTOGRAM W/LOWER EXTREMITY N/A 04/29/2020  ? Procedure: ABDOMINAL AORTOGRAM W/LOWER EXTREMITY;  Surgeon: [name], MD;  Location: [place] [facility];  Service: Cardiovascular;  Laterality: N/A;  Lt Leg  ? ABDOMINAL HYSTERECTOMY    ? ANTERIOR REPAIR/ SPARC PUBOVAGINAL SLING  05-03-2002  ? BUNIONECTOMY WITH HAMMERTOE RECONSTRUCTION Right 03/01/2013  ? Procedure: RIGHT FOOT EXCISION BUNIONETTE AND FUSION OF DIP FOURTH TOE;  Surgeon: [name], MD;  Location: [place];  Service: Orthopedics;  Laterality: Right;  ? CARDIAC CATHETERIZATION  02-03-2009  DR  [name]  ? SINGLE-VESSEL OBSTRUCTIVE CAD, SMALL DIAGONAL BRANCH/ NORMAL LVF/ SEVERE MITRAL INSUFFICIENCY/ SEVERE PULMONARY HYPERTENSION  ? CATARACT EXTRACTION W/ INTRAOCULAR LENS  IMPLANT, BILATERAL  2003  ? COLONOSCOPY  07/25/2014  ? few mall scattered  diverticula in the ectire examined colon. Small internal hemmorhoids.   ? HAMMER TOE SURGERY  02/28/2012  ? Procedure: HAMMER TOE CORRECTION;  Surgeon: [name], MD;  Location: [place];  Service: Orthopedics;  Laterality: Right;  FUSION OF PROXIMAL INTERPHALANGEAL  RIGHT THIRD CLAW TOE  ? PARS PLANA VITRECTOMY W/ REPAIR OF MACULAR HOLE  02-04-2002  ? LEFT EYE  ? PERIPHERAL VASCULAR BALLOON ANGIOPLASTY  04/29/2020  ? Procedure: PERIPHERAL VASCULAR BALLOON ANGIOPLASTY;  Surgeon: [name], MD;  Location: [place] [facility];  Service: Cardiovascular;;  Lt. AT  ? REPAIR RIGHT SECOND CLAW TOE/ VARUS MEDIAL CLOSING WEDGE OSTEOTOMY PROXIMAL PHALANX RIGHT GREAT TOE  05-05-2005  ? TOTAL HIP ARTHROPLASTY    ? BILATERAL---  LEFT 4/98;   RIGHT  11/98  ? VARUS OSTEOTOMY PROXIMAL PHALANX, LEFT GREAT TOE/ PARING DOWN THE CALLUS , SECOND LEFT TOE  10-27-2008  ?  ?Family History  ?Problem Relation Age of Onset  ? Heart disease Mother 15  ? Prostate cancer Father 33  ? ?Social History  ? ?Socioeconomic History  ? Marital status: Divorced  ?  Spouse name: Not on file  ? Number of children: 3  ? Years of education: College  ? Highest education level: Not on file  ?Occupational History  ? Occupation: Retired  ?  Employer: RETIRED  ?Tobacco Use  ? Smoking status: Never  ? Smokeless tobacco: Never  ?Vaping Use  ? Vaping Use: Never used  ?Substance and Sexual Activity  ? Alcohol use: Not Currently  ?  Comment: occasional  ? Drug use: Yes  ?  Types: Hydrocodone  ? Sexual activity: Not Currently  ?Other Topics Concern  ? Not on file  ?Social History Narrative  ? Patient lives at home alone.  ? Caffeine Use: 1 cup daily occasionally  ? ?Social Determinants of Health  ? ?Financial Resource Strain: Low Risk   ? Difficulty of Paying Living Expenses: Not hard at all  ?Food Insecurity: No Food Insecurity  ? Worried About Charity fundraiser in the Last Year: Never true  ? Ran Out of Food in the Last Year: Never  true  ?Transportation Needs: Unmet Transportation Needs  ? Lack of Transportation (Medical): Yes  ? Lack of Transportation (Non-Medical): No  ?Physical Activity: Inactive  ? Days of Exercise per Week: 0 days  ? Minutes of Exercise per Session: 0 min  ?Stress: Stress Concern Present  ? Feeling of Stress : To some extent  ?Social Connections: Not on file  ? ? ?Review of Systems  ?Constitutional:  Negative for chills, fatigue and fever.  ?HENT:  Negative for congestion, ear pain and sore throat.   ?Eyes:  Negative for visual disturbance.  ?Respiratory:  Negative for cough and shortness of  breath.   ?Cardiovascular:  Negative for chest pain and palpitations.  ?Gastrointestinal:  Negative for abdominal pain, constipation, diarrhea, nausea and vomiting.  ?Endocrine: Negative for polydipsia, polyphagia and polyuria.  ?Genitourinary:  Negative for difficulty urinating and dysuria.  ?Musculoskeletal:  Negative for arthralgias, back pain and myalgias.  ?Skin:  Negative for rash.  ?Neurological:  Negative for headaches.  ?Hematological: Negative.   ?Psychiatric/Behavioral:  Negative for dysphoric mood. The patient is not nervous/anxious.   ? ? ?Objective:  ?BP 110/60   Pulse (!) 120   Temp 98.8 ?F (37.1 ?C)   Resp 15   Ht '5\' 4"'$  (1.626 m)   Wt 147 lb (66.7 kg)   SpO2 97%   BMI 25.23 kg/m?  ? ? ?  03/15/2022  ?  1:56 PM 12/09/2021  ?  9:17 AM 10/12/2021  ? 10:39 AM  ?BP/Weight  ?Systolic BP 130 865 784  ?Diastolic BP 60 90 80  ?Wt. (Lbs) 147 163 167.2  ?BMI 25.23 kg/m2 27.98 kg/m2 28.7 kg/m2  ? ? ?Physical Exam ?Vitals reviewed.  ?Constitutional:   ?   General: She is not in acute distress. ?   Appearance: Normal appearance.  ?HENT:  ?   Head: Normocephalic.  ?   Right Ear: Tympanic membrane normal.  ?   Left Ear: Tympanic membrane normal.  ?   Nose: Nose normal.  ?   Mouth/Throat:  ?   Mouth: Mucous membranes are moist.  ?   Pharynx: Oropharynx is clear.  ?Eyes:  ?   Extraocular Movements: Extraocular movements intact.  ?    Conjunctiva/sclera: Conjunctivae normal.  ?   Pupils: Pupils are equal, round, and reactive to light.  ?Cardiovascular:  ?   Rate and Rhythm: Normal rate and regular rhythm.  ?   Pulses: Normal pulses.  ?   Hea

## 2022-03-15 NOTE — Telephone Encounter (Signed)
Called and spoke with [name] from [facility]. She stated pt is now at home and she will be able to draw her PT/INR; however, her results will not be back until tomorrow. I explained that getting the results is okay and will be call when we receive the results to give dosing instructions.  ?

## 2022-03-15 NOTE — Telephone Encounter (Signed)
Estill Bamberg calling to speak with Vernie Shanks, RN again ?

## 2022-03-15 NOTE — Telephone Encounter (Signed)
She called to say she wasn't able to get the patient PT/INR today as the patient wasn't home.  She needs a new order to be able to get it tomorrow.   ?

## 2022-03-16 ENCOUNTER — Telehealth: Payer: Self-pay

## 2022-03-16 DIAGNOSIS — Z7901 Long term (current) use of anticoagulants: Secondary | ICD-10-CM | POA: Diagnosis not present

## 2022-03-16 DIAGNOSIS — D519 Vitamin B12 deficiency anemia, unspecified: Secondary | ICD-10-CM | POA: Diagnosis not present

## 2022-03-16 DIAGNOSIS — I482 Chronic atrial fibrillation, unspecified: Secondary | ICD-10-CM | POA: Diagnosis not present

## 2022-03-16 DIAGNOSIS — Z9181 History of falling: Secondary | ICD-10-CM | POA: Diagnosis not present

## 2022-03-16 DIAGNOSIS — Z79891 Long term (current) use of opiate analgesic: Secondary | ICD-10-CM | POA: Diagnosis not present

## 2022-03-16 DIAGNOSIS — S22080D Wedge compression fracture of T11-T12 vertebra, subsequent encounter for fracture with routine healing: Secondary | ICD-10-CM | POA: Diagnosis not present

## 2022-03-16 DIAGNOSIS — N329 Bladder disorder, unspecified: Secondary | ICD-10-CM | POA: Diagnosis not present

## 2022-03-16 DIAGNOSIS — G8929 Other chronic pain: Secondary | ICD-10-CM | POA: Diagnosis not present

## 2022-03-16 DIAGNOSIS — I10 Essential (primary) hypertension: Secondary | ICD-10-CM | POA: Diagnosis not present

## 2022-03-16 DIAGNOSIS — S92511D Displaced fracture of proximal phalanx of right lesser toe(s), subsequent encounter for fracture with routine healing: Secondary | ICD-10-CM | POA: Diagnosis not present

## 2022-03-16 DIAGNOSIS — M6259 Muscle wasting and atrophy, not elsewhere classified, multiple sites: Secondary | ICD-10-CM | POA: Diagnosis not present

## 2022-03-16 DIAGNOSIS — Z96643 Presence of artificial hip joint, bilateral: Secondary | ICD-10-CM | POA: Diagnosis not present

## 2022-03-16 DIAGNOSIS — K219 Gastro-esophageal reflux disease without esophagitis: Secondary | ICD-10-CM | POA: Diagnosis not present

## 2022-03-16 DIAGNOSIS — E785 Hyperlipidemia, unspecified: Secondary | ICD-10-CM | POA: Diagnosis not present

## 2022-03-16 LAB — LIPID PANEL
Chol/HDL Ratio: 2.8 ratio (ref 0.0–4.4)
Cholesterol, Total: 135 mg/dL (ref 100–199)
HDL: 48 mg/dL (ref >39)
LDL Chol Calc (NIH): 63 mg/dL (ref 0–99)
Triglycerides: 135 mg/dL (ref 0–149)
VLDL Cholesterol Cal: 24 mg/dL (ref 5–40)

## 2022-03-16 LAB — COMPREHENSIVE METABOLIC PANEL
ALT: 20 IU/L (ref 0–32)
AST: 33 IU/L (ref 0–40)
Albumin/Globulin Ratio: 1.4 (ref 1.2–2.2)
Albumin: 4.2 g/dL (ref 3.6–4.6)
Alkaline Phosphatase: 119 IU/L (ref 44–121)
BUN/Creatinine Ratio: 12 (ref 12–28)
BUN: 10 mg/dL (ref 8–27)
Bilirubin Total: 0.5 mg/dL (ref 0.0–1.2)
CO2: 21 mmol/L (ref 20–29)
Calcium: 10.3 mg/dL (ref 8.7–10.3)
Chloride: 104 mmol/L (ref 96–106)
Creatinine, Ser: 0.83 mg/dL (ref 0.57–1.00)
Globulin, Total: 3 g/dL (ref 1.5–4.5)
Glucose: 101 mg/dL — ABNORMAL HIGH (ref 70–99)
Potassium: 4.5 mmol/L (ref 3.5–5.2)
Sodium: 142 mmol/L (ref 134–144)
Total Protein: 7.2 g/dL (ref 6.0–8.5)
eGFR: 68 mL/min/{1.73_m2} (ref >59)

## 2022-03-16 LAB — CBC WITH DIFFERENTIAL/PLATELET
Basophils Absolute: 0 10*3/uL (ref 0.0–0.2)
Basos: 1 %
EOS (ABSOLUTE): 0.1 10*3/uL (ref 0.0–0.4)
Eos: 2 %
Hematocrit: 39.6 % (ref 34.0–46.6)
Hemoglobin: 12.7 g/dL (ref 11.1–15.9)
Immature Grans (Abs): 0 10*3/uL (ref 0.0–0.1)
Immature Granulocytes: 0 %
Lymphocytes Absolute: 0.9 10*3/uL (ref 0.7–3.1)
Lymphs: 13 %
MCH: 29.1 pg (ref 26.6–33.0)
MCHC: 32.1 g/dL (ref 31.5–35.7)
MCV: 91 fL (ref 79–97)
Monocytes Absolute: 0.5 10*3/uL (ref 0.1–0.9)
Monocytes: 7 %
Neutrophils Absolute: 4.9 10*3/uL (ref 1.4–7.0)
Neutrophils: 77 %
Platelets: 234 10*3/uL (ref 150–450)
RBC: 4.36 x10E6/uL (ref 3.77–5.28)
RDW: 14.4 % (ref 11.7–15.4)
WBC: 6.4 10*3/uL (ref 3.4–10.8)

## 2022-03-16 LAB — HEMOGLOBIN A1C
Est. average glucose Bld gHb Est-mCnc: 123 mg/dL
Hgb A1c MFr Bld: 5.9 % — ABNORMAL HIGH (ref 4.8–5.6)

## 2022-03-16 LAB — CARDIOVASCULAR RISK ASSESSMENT

## 2022-03-16 NOTE — Telephone Encounter (Signed)
Marla, CMA called and stated that she is checking with the [facility] representative regarding the INR not being resulted. She stated that she would update if needed and the results should come over in Epic otherwise and to monitor.  ?

## 2022-03-16 NOTE — Progress Notes (Signed)
Glucose 101, kidney and liver tests normal, A1c 5.9, cholesterol normal, CBC normal ?lp

## 2022-03-16 NOTE — Telephone Encounter (Signed)
Marla, CMA called and stated the INR did not result because they did not precess the lab order. I made her aware that pt's INR is typically drawn by [facility] and since [facility] has already came to pt's house this week, [facility] will not be going back out for a home visit soon and pt's INR needs to be checked.  ? ?Lemmie Evens called pt to make a new lab appt for pt to come back and have INR drawn; however, Lemmie Evens stated the pt refused and stated she wanted Home Health to draw INR.  ? ?I attempted to call pt and explain situation; no answer. Left message.  ?

## 2022-03-16 NOTE — Telephone Encounter (Signed)
Patient was made aware the INR was not processed because the order was not receive it. I asked if she can come this afternoon to get INR. She said that she does not have transportation. I told her that I will contact Abigael RN from coumadin clinic and see if she can reach [place], Home health nurse. Patient mentioned to try to call amanda also to see if she can come by. ?

## 2022-03-16 NOTE — Chronic Care Management (AMB) (Signed)
03-16-2022: Called patient to reschedule telephone appointment with [facility] CPP. Left patient a voicemail with new appointment on 05-17-2022 at 2:00 and if appointment doesn't work to call back. ? ? ?Malecca Hicks CMA ?Clinical Pharmacist Assistant ?[phone] ? ?

## 2022-03-16 NOTE — Telephone Encounter (Signed)
Called and spoke with [name], RN at [facility]. She stated they can schedule Jodi Bamberg, RN to go out to pt's home and check INR tomorrow, 03/17/22.   ? ?Called pt and made her aware that INR will be checked tomorrow by [facility]. Verbalized understanding.  ?

## 2022-03-16 NOTE — Telephone Encounter (Signed)
Called Estill Bamberg with [facility]. She confirmed that pt had PT/INR drawn at [facility] office and Anticoagulation Clinic should receive results via fax today. Made Estill Bamberg aware that we will call her back with Warfarin dosing instructions as soon as results are received.  ?  ?Called Dr perry's office and requested PT/INR results be sent to Anticoagulation Clinic.  ?

## 2022-03-17 ENCOUNTER — Ambulatory Visit (INDEPENDENT_AMBULATORY_CARE_PROVIDER_SITE_OTHER): Payer: Medicare Other | Admitting: Cardiovascular Disease

## 2022-03-17 ENCOUNTER — Telehealth: Payer: Self-pay | Admitting: Cardiology

## 2022-03-17 DIAGNOSIS — M6259 Muscle wasting and atrophy, not elsewhere classified, multiple sites: Secondary | ICD-10-CM | POA: Diagnosis not present

## 2022-03-17 DIAGNOSIS — S92511D Displaced fracture of proximal phalanx of right lesser toe(s), subsequent encounter for fracture with routine healing: Secondary | ICD-10-CM | POA: Diagnosis not present

## 2022-03-17 DIAGNOSIS — E785 Hyperlipidemia, unspecified: Secondary | ICD-10-CM | POA: Diagnosis not present

## 2022-03-17 DIAGNOSIS — G8929 Other chronic pain: Secondary | ICD-10-CM | POA: Diagnosis not present

## 2022-03-17 DIAGNOSIS — D519 Vitamin B12 deficiency anemia, unspecified: Secondary | ICD-10-CM | POA: Diagnosis not present

## 2022-03-17 DIAGNOSIS — Z9181 History of falling: Secondary | ICD-10-CM | POA: Diagnosis not present

## 2022-03-17 DIAGNOSIS — N329 Bladder disorder, unspecified: Secondary | ICD-10-CM | POA: Diagnosis not present

## 2022-03-17 DIAGNOSIS — Z7901 Long term (current) use of anticoagulants: Secondary | ICD-10-CM

## 2022-03-17 DIAGNOSIS — Z96643 Presence of artificial hip joint, bilateral: Secondary | ICD-10-CM | POA: Diagnosis not present

## 2022-03-17 DIAGNOSIS — I10 Essential (primary) hypertension: Secondary | ICD-10-CM | POA: Diagnosis not present

## 2022-03-17 DIAGNOSIS — Z79891 Long term (current) use of opiate analgesic: Secondary | ICD-10-CM | POA: Diagnosis not present

## 2022-03-17 DIAGNOSIS — K219 Gastro-esophageal reflux disease without esophagitis: Secondary | ICD-10-CM | POA: Diagnosis not present

## 2022-03-17 DIAGNOSIS — S22080D Wedge compression fracture of T11-T12 vertebra, subsequent encounter for fracture with routine healing: Secondary | ICD-10-CM | POA: Diagnosis not present

## 2022-03-17 DIAGNOSIS — I482 Chronic atrial fibrillation, unspecified: Secondary | ICD-10-CM | POA: Diagnosis not present

## 2022-03-17 LAB — POCT INR: INR: 2 (ref 2.0–3.0)

## 2022-03-17 NOTE — Telephone Encounter (Signed)
INR is 2.0. ? ?Pt reports that she would like to resume visits with our coumadin clinic on Tuesdays.  ? ?Will route to coumadin clinic to advise on medication and set up future appointments. ?

## 2022-03-17 NOTE — Telephone Encounter (Signed)
?  Need to report INR ?

## 2022-03-17 NOTE — Telephone Encounter (Signed)
I spoke to the patient with INR advisement.  Verbalized understanding ?

## 2022-03-21 ENCOUNTER — Telehealth: Payer: Self-pay | Admitting: Pharmacist

## 2022-03-21 DIAGNOSIS — Z79891 Long term (current) use of opiate analgesic: Secondary | ICD-10-CM | POA: Diagnosis not present

## 2022-03-21 DIAGNOSIS — S22080D Wedge compression fracture of T11-T12 vertebra, subsequent encounter for fracture with routine healing: Secondary | ICD-10-CM | POA: Diagnosis not present

## 2022-03-21 DIAGNOSIS — S92511D Displaced fracture of proximal phalanx of right lesser toe(s), subsequent encounter for fracture with routine healing: Secondary | ICD-10-CM | POA: Diagnosis not present

## 2022-03-21 DIAGNOSIS — Z9181 History of falling: Secondary | ICD-10-CM | POA: Diagnosis not present

## 2022-03-21 DIAGNOSIS — E785 Hyperlipidemia, unspecified: Secondary | ICD-10-CM | POA: Diagnosis not present

## 2022-03-21 DIAGNOSIS — N329 Bladder disorder, unspecified: Secondary | ICD-10-CM | POA: Diagnosis not present

## 2022-03-21 DIAGNOSIS — M6259 Muscle wasting and atrophy, not elsewhere classified, multiple sites: Secondary | ICD-10-CM | POA: Diagnosis not present

## 2022-03-21 DIAGNOSIS — I10 Essential (primary) hypertension: Secondary | ICD-10-CM | POA: Diagnosis not present

## 2022-03-21 DIAGNOSIS — I482 Chronic atrial fibrillation, unspecified: Secondary | ICD-10-CM | POA: Diagnosis not present

## 2022-03-21 DIAGNOSIS — Z7901 Long term (current) use of anticoagulants: Secondary | ICD-10-CM | POA: Diagnosis not present

## 2022-03-21 DIAGNOSIS — D519 Vitamin B12 deficiency anemia, unspecified: Secondary | ICD-10-CM | POA: Diagnosis not present

## 2022-03-21 DIAGNOSIS — K219 Gastro-esophageal reflux disease without esophagitis: Secondary | ICD-10-CM | POA: Diagnosis not present

## 2022-03-21 DIAGNOSIS — Z96643 Presence of artificial hip joint, bilateral: Secondary | ICD-10-CM | POA: Diagnosis not present

## 2022-03-21 DIAGNOSIS — G8929 Other chronic pain: Secondary | ICD-10-CM | POA: Diagnosis not present

## 2022-03-21 NOTE — Telephone Encounter (Signed)
Estill Bamberg with home health called. Reports patient was confused regarding warfarin regimen and took 1/2 tablet Saturday and Sunday.  Has repeat INR check in office on 5/16. Advised to continue current schedule and dose can be adjusted after INR check. ?

## 2022-03-22 ENCOUNTER — Ambulatory Visit (INDEPENDENT_AMBULATORY_CARE_PROVIDER_SITE_OTHER): Payer: Medicare Other

## 2022-03-22 DIAGNOSIS — Z5181 Encounter for therapeutic drug level monitoring: Secondary | ICD-10-CM

## 2022-03-22 DIAGNOSIS — Z7901 Long term (current) use of anticoagulants: Secondary | ICD-10-CM

## 2022-03-22 LAB — POCT INR: INR: 1.4 — AB (ref 2.0–3.0)

## 2022-03-22 NOTE — Patient Instructions (Signed)
Description   ?Today, take 1.5 tablets and then resume taking 2.'5mg'$  daily except 1.'25mg'$  on Mondays, Wednesdays, and Fridays. ?Stay consistent with greens (2 times per week).  ?Repeat INR in 1 week. Coumadin Clinic: [phone] ?  ?   ?

## 2022-03-24 DIAGNOSIS — Z96643 Presence of artificial hip joint, bilateral: Secondary | ICD-10-CM | POA: Diagnosis not present

## 2022-03-24 DIAGNOSIS — S92511D Displaced fracture of proximal phalanx of right lesser toe(s), subsequent encounter for fracture with routine healing: Secondary | ICD-10-CM | POA: Diagnosis not present

## 2022-03-24 DIAGNOSIS — Z7901 Long term (current) use of anticoagulants: Secondary | ICD-10-CM | POA: Diagnosis not present

## 2022-03-24 DIAGNOSIS — E785 Hyperlipidemia, unspecified: Secondary | ICD-10-CM | POA: Diagnosis not present

## 2022-03-24 DIAGNOSIS — G8929 Other chronic pain: Secondary | ICD-10-CM | POA: Diagnosis not present

## 2022-03-24 DIAGNOSIS — Z79891 Long term (current) use of opiate analgesic: Secondary | ICD-10-CM | POA: Diagnosis not present

## 2022-03-24 DIAGNOSIS — D519 Vitamin B12 deficiency anemia, unspecified: Secondary | ICD-10-CM | POA: Diagnosis not present

## 2022-03-24 DIAGNOSIS — S22080D Wedge compression fracture of T11-T12 vertebra, subsequent encounter for fracture with routine healing: Secondary | ICD-10-CM | POA: Diagnosis not present

## 2022-03-24 DIAGNOSIS — K219 Gastro-esophageal reflux disease without esophagitis: Secondary | ICD-10-CM | POA: Diagnosis not present

## 2022-03-24 DIAGNOSIS — N329 Bladder disorder, unspecified: Secondary | ICD-10-CM | POA: Diagnosis not present

## 2022-03-24 DIAGNOSIS — M6259 Muscle wasting and atrophy, not elsewhere classified, multiple sites: Secondary | ICD-10-CM | POA: Diagnosis not present

## 2022-03-24 DIAGNOSIS — Z9181 History of falling: Secondary | ICD-10-CM | POA: Diagnosis not present

## 2022-03-24 DIAGNOSIS — I10 Essential (primary) hypertension: Secondary | ICD-10-CM | POA: Diagnosis not present

## 2022-03-24 DIAGNOSIS — I482 Chronic atrial fibrillation, unspecified: Secondary | ICD-10-CM | POA: Diagnosis not present

## 2022-03-25 ENCOUNTER — Telehealth: Payer: Self-pay

## 2022-03-25 NOTE — Chronic Care Management (AMB) (Unsigned)
Chronic Care Management Pharmacy Assistant   Name: [name]  MRN: [id number] DOB: Dec 20, 1931   Reason for Encounter: Disease State/ General  Recent office visits:  03-15-2022 [name], MD. Glucose= 101, A1C= 5.9. Referrals placed to home health and podiatry.  03-02-2022 Erie Noe, LPN. Medicare annual wellness visit.  12-09-2021 [name], MD. Glucose= 112, AST= 42, ALT= 40. A1C= 6.5. Vitamin D= 143.0.  Recent consult visits:  03-22-2022 [name], RN (Cardiology). Anti coag visit.  03-17-2022 Troy Sine, MD (Cardiology). Anti coag visit.  03-08-2022 Hemphill, Myrtis Hopping, RN (Cardiology). Anti coag visit.  02-28-2022 Hemphill, Myrtis Hopping, RN (Cardiology). Anti coag visit.  01-04-2022 [name], RN (Cardiology). Anti coag visit.  12-07-2021 [name], RN (Cardiology). Anti coag visit.  Hospital visits:  None in previous 6 months  Medications: Outpatient Encounter Medications as of 03/25/2022  Medication Sig   ascorbic acid (VITAMIN C) 500 MG tablet Take 600 mg by mouth daily.    atorvastatin (LIPITOR) 80 MG tablet Take 1 tablet (80 mg total) by mouth daily.   Biotin 1000 MCG tablet Take 1 tablet by mouth daily.    Calcium Carbonate-Vitamin D (CALCIUM + D PO) Take by mouth daily.   Cholecalciferol (VITAMIN D PO) Take by mouth daily.   cyanocobalamin (,VITAMIN B-12,) 1000 MCG/ML injection Inject 1 mL (1,000 mcg total) into the muscle every 30 (thirty) days.   diltiazem (CARDIZEM CD) 120 MG 24 hr capsule Take 120 mg by mouth daily.   HYDROcodone-acetaminophen (NORCO) 10-325 MG per tablet Take 1 tablet by mouth every 6 (six) hours as needed for moderate pain.    lidocaine (LIDODERM) 5 % 1 patch daily.   lisinopril (ZESTRIL) 20 MG tablet Take 1 tablet (20 mg total) by mouth daily.   oxybutynin (DITROPAN) 5 MG tablet Take 1 tablet (5 mg total) by mouth 3 (three) times daily.   pantoprazole (PROTONIX) 40  MG tablet Take 40 mg by mouth daily.   potassium chloride SA (KLOR-CON) 20 MEQ tablet TAKE ONE TABLET BY MOUTH TWICE DAILY   predniSONE (DELTASONE) 10 MG tablet Take by mouth.   sertraline (ZOLOFT) 50 MG tablet Take 75 mg by mouth daily.    vitamin A 8000 UNIT capsule Take 8,000 Units by mouth daily.   warfarin (COUMADIN) 2.5 MG tablet Take 1/2 tablet to 1 tablet daily by mouth or as directed by Anticoagulation Clinic.   No facility-administered encounter medications on file as of 03/25/2022.  Door for General Review Call   Chart Review:  Have there been any documented new, changed, or discontinued medications since last visit? {yes/no:20286} (If yes, include name, dose, frequency, date) Has there been any documented recent hospitalizations or ED visits since last visit with Clinical Pharmacist? {yes/no:20286} Brief Summary (including medication and/or Diagnosis changes):   Adherence Review:  Does the Clinical Pharmacist Assistant have access to adherence rates? {yes/no:20286} Adherence rates for STAR metric medications (List medication(s)/day supply/ last 2 fill dates). Adherence rates for medications indicated for disease state being reviewed (List medication(s)/day supply/ last 2 fill dates). Does the patient have >5 day gap between last estimated fill dates for any of the above medications or other medication gaps? {yes/no:20286} Reason for medication gaps.   Disease State Questions:  Able to connect with Patient? {yes/no:20286} Did patient have any problems with their health recently? {yes/no:20286} Note problems and Concerns: Have you had any admissions or emergency room visits or worsening of your condition(s)  since last visit? {yes/no:20286} Details of ED visit, hospital visit and/or worsening condition(s): Have you had any visits with new specialists or providers since your last visit? No Explain: Have you had any new health care problem(s) since  your last visit? {yes/no:20286} New problem(s) reported: Have you run out of any of your medications since you last spoke with clinical pharmacist? {yes/no:20286} What caused you to run out of your medications? Are there any medications you are not taking as prescribed? {yes/no:20286} What kept you from taking your medications as prescribed? Are you having any issues or side effects with your medications? {yes/no:20286} Note of issues or side effects: Do you have any other health concerns or questions you want to discuss with your Clinical Pharmacist before your next visit? {yes/no:20286} Note additional concerns and questions from Patient. Are there any health concerns that you feel we can do a better job addressing? {yes/no:20286} Note Patient's response. Are you having any problems with any of the following since the last visit: (select all that apply)  {General Call:27390}  Details: 12. Any falls since last visit? {yes/no:20286}  Details: 13. Any increased or uncontrolled pain since last visit? {yes/no:20286}  Details: 14. Next visit Type: {Telephone/Office:25179}       Visit with:        Date:        Time:  55. Additional Details? {yes/no:20286}   03-25-2022: 1st attempt left VM 03-28-2022: 2nd attempt left VM  Care Gaps: Yearly ophthalmology overdue   Star Rating Drugs: Atorvastatin 80 mg- Last filled 02-09-2022 30 DS Lisinopril 20 mg- Last filled 03-04-2022 90 DS  [place] Clinical Pharmacist Assistant [phone]

## 2022-03-28 ENCOUNTER — Ambulatory Visit: Payer: Self-pay | Admitting: Podiatry

## 2022-03-28 DIAGNOSIS — K219 Gastro-esophageal reflux disease without esophagitis: Secondary | ICD-10-CM | POA: Diagnosis not present

## 2022-03-28 DIAGNOSIS — Z9181 History of falling: Secondary | ICD-10-CM | POA: Diagnosis not present

## 2022-03-28 DIAGNOSIS — I10 Essential (primary) hypertension: Secondary | ICD-10-CM | POA: Diagnosis not present

## 2022-03-28 DIAGNOSIS — E785 Hyperlipidemia, unspecified: Secondary | ICD-10-CM | POA: Diagnosis not present

## 2022-03-28 DIAGNOSIS — N329 Bladder disorder, unspecified: Secondary | ICD-10-CM | POA: Diagnosis not present

## 2022-03-28 DIAGNOSIS — S92511D Displaced fracture of proximal phalanx of right lesser toe(s), subsequent encounter for fracture with routine healing: Secondary | ICD-10-CM | POA: Diagnosis not present

## 2022-03-28 DIAGNOSIS — G8929 Other chronic pain: Secondary | ICD-10-CM | POA: Diagnosis not present

## 2022-03-28 DIAGNOSIS — I482 Chronic atrial fibrillation, unspecified: Secondary | ICD-10-CM | POA: Diagnosis not present

## 2022-03-28 DIAGNOSIS — S22080D Wedge compression fracture of T11-T12 vertebra, subsequent encounter for fracture with routine healing: Secondary | ICD-10-CM | POA: Diagnosis not present

## 2022-03-28 DIAGNOSIS — D519 Vitamin B12 deficiency anemia, unspecified: Secondary | ICD-10-CM | POA: Diagnosis not present

## 2022-03-28 DIAGNOSIS — M6259 Muscle wasting and atrophy, not elsewhere classified, multiple sites: Secondary | ICD-10-CM | POA: Diagnosis not present

## 2022-03-28 DIAGNOSIS — Z96643 Presence of artificial hip joint, bilateral: Secondary | ICD-10-CM | POA: Diagnosis not present

## 2022-03-28 DIAGNOSIS — Z79891 Long term (current) use of opiate analgesic: Secondary | ICD-10-CM | POA: Diagnosis not present

## 2022-03-28 DIAGNOSIS — Z7901 Long term (current) use of anticoagulants: Secondary | ICD-10-CM | POA: Diagnosis not present

## 2022-03-29 ENCOUNTER — Ambulatory Visit (INDEPENDENT_AMBULATORY_CARE_PROVIDER_SITE_OTHER): Payer: Medicare Other

## 2022-03-29 DIAGNOSIS — Z7901 Long term (current) use of anticoagulants: Secondary | ICD-10-CM

## 2022-03-29 DIAGNOSIS — I482 Chronic atrial fibrillation, unspecified: Secondary | ICD-10-CM

## 2022-03-29 LAB — POCT INR: INR: 2 (ref 2.0–3.0)

## 2022-03-29 NOTE — Patient Instructions (Signed)
Continue 2.'5mg'$  daily except 1.'25mg'$  on Mondays, Wednesdays, and Fridays. Stay consistent with greens (2 times per week).  Repeat INR in 3 weeks. Coumadin Clinic: [phone]

## 2022-03-31 DIAGNOSIS — E785 Hyperlipidemia, unspecified: Secondary | ICD-10-CM | POA: Diagnosis not present

## 2022-03-31 DIAGNOSIS — N329 Bladder disorder, unspecified: Secondary | ICD-10-CM | POA: Diagnosis not present

## 2022-03-31 DIAGNOSIS — K219 Gastro-esophageal reflux disease without esophagitis: Secondary | ICD-10-CM | POA: Diagnosis not present

## 2022-03-31 DIAGNOSIS — S92511D Displaced fracture of proximal phalanx of right lesser toe(s), subsequent encounter for fracture with routine healing: Secondary | ICD-10-CM | POA: Diagnosis not present

## 2022-03-31 DIAGNOSIS — I482 Chronic atrial fibrillation, unspecified: Secondary | ICD-10-CM | POA: Diagnosis not present

## 2022-03-31 DIAGNOSIS — Z9181 History of falling: Secondary | ICD-10-CM | POA: Diagnosis not present

## 2022-03-31 DIAGNOSIS — G8929 Other chronic pain: Secondary | ICD-10-CM | POA: Diagnosis not present

## 2022-03-31 DIAGNOSIS — Z7901 Long term (current) use of anticoagulants: Secondary | ICD-10-CM | POA: Diagnosis not present

## 2022-03-31 DIAGNOSIS — Z96643 Presence of artificial hip joint, bilateral: Secondary | ICD-10-CM | POA: Diagnosis not present

## 2022-03-31 DIAGNOSIS — Z79891 Long term (current) use of opiate analgesic: Secondary | ICD-10-CM | POA: Diagnosis not present

## 2022-03-31 DIAGNOSIS — M6259 Muscle wasting and atrophy, not elsewhere classified, multiple sites: Secondary | ICD-10-CM | POA: Diagnosis not present

## 2022-03-31 DIAGNOSIS — S22080D Wedge compression fracture of T11-T12 vertebra, subsequent encounter for fracture with routine healing: Secondary | ICD-10-CM | POA: Diagnosis not present

## 2022-03-31 DIAGNOSIS — D519 Vitamin B12 deficiency anemia, unspecified: Secondary | ICD-10-CM | POA: Diagnosis not present

## 2022-03-31 DIAGNOSIS — I10 Essential (primary) hypertension: Secondary | ICD-10-CM | POA: Diagnosis not present

## 2022-04-05 DIAGNOSIS — I482 Chronic atrial fibrillation, unspecified: Secondary | ICD-10-CM | POA: Diagnosis not present

## 2022-04-05 DIAGNOSIS — M6259 Muscle wasting and atrophy, not elsewhere classified, multiple sites: Secondary | ICD-10-CM | POA: Diagnosis not present

## 2022-04-05 DIAGNOSIS — Z7901 Long term (current) use of anticoagulants: Secondary | ICD-10-CM | POA: Diagnosis not present

## 2022-04-05 DIAGNOSIS — Z9181 History of falling: Secondary | ICD-10-CM | POA: Diagnosis not present

## 2022-04-05 DIAGNOSIS — D519 Vitamin B12 deficiency anemia, unspecified: Secondary | ICD-10-CM | POA: Diagnosis not present

## 2022-04-05 DIAGNOSIS — I10 Essential (primary) hypertension: Secondary | ICD-10-CM | POA: Diagnosis not present

## 2022-04-05 DIAGNOSIS — E785 Hyperlipidemia, unspecified: Secondary | ICD-10-CM | POA: Diagnosis not present

## 2022-04-05 DIAGNOSIS — K219 Gastro-esophageal reflux disease without esophagitis: Secondary | ICD-10-CM | POA: Diagnosis not present

## 2022-04-05 DIAGNOSIS — Z96643 Presence of artificial hip joint, bilateral: Secondary | ICD-10-CM | POA: Diagnosis not present

## 2022-04-05 DIAGNOSIS — S92511D Displaced fracture of proximal phalanx of right lesser toe(s), subsequent encounter for fracture with routine healing: Secondary | ICD-10-CM | POA: Diagnosis not present

## 2022-04-05 DIAGNOSIS — G8929 Other chronic pain: Secondary | ICD-10-CM | POA: Diagnosis not present

## 2022-04-05 DIAGNOSIS — S22080D Wedge compression fracture of T11-T12 vertebra, subsequent encounter for fracture with routine healing: Secondary | ICD-10-CM | POA: Diagnosis not present

## 2022-04-05 DIAGNOSIS — Z79891 Long term (current) use of opiate analgesic: Secondary | ICD-10-CM | POA: Diagnosis not present

## 2022-04-05 DIAGNOSIS — N329 Bladder disorder, unspecified: Secondary | ICD-10-CM | POA: Diagnosis not present

## 2022-04-05 NOTE — Telephone Encounter (Signed)
This encounter was created in error - please disregard.

## 2022-04-07 DIAGNOSIS — S92511D Displaced fracture of proximal phalanx of right lesser toe(s), subsequent encounter for fracture with routine healing: Secondary | ICD-10-CM | POA: Diagnosis not present

## 2022-04-07 DIAGNOSIS — M6259 Muscle wasting and atrophy, not elsewhere classified, multiple sites: Secondary | ICD-10-CM | POA: Diagnosis not present

## 2022-04-07 DIAGNOSIS — S22080D Wedge compression fracture of T11-T12 vertebra, subsequent encounter for fracture with routine healing: Secondary | ICD-10-CM | POA: Diagnosis not present

## 2022-04-07 DIAGNOSIS — Z7901 Long term (current) use of anticoagulants: Secondary | ICD-10-CM | POA: Diagnosis not present

## 2022-04-07 DIAGNOSIS — Z79891 Long term (current) use of opiate analgesic: Secondary | ICD-10-CM | POA: Diagnosis not present

## 2022-04-07 DIAGNOSIS — E785 Hyperlipidemia, unspecified: Secondary | ICD-10-CM | POA: Diagnosis not present

## 2022-04-07 DIAGNOSIS — Z96643 Presence of artificial hip joint, bilateral: Secondary | ICD-10-CM | POA: Diagnosis not present

## 2022-04-07 DIAGNOSIS — D519 Vitamin B12 deficiency anemia, unspecified: Secondary | ICD-10-CM | POA: Diagnosis not present

## 2022-04-07 DIAGNOSIS — N329 Bladder disorder, unspecified: Secondary | ICD-10-CM | POA: Diagnosis not present

## 2022-04-07 DIAGNOSIS — I10 Essential (primary) hypertension: Secondary | ICD-10-CM | POA: Diagnosis not present

## 2022-04-07 DIAGNOSIS — Z9181 History of falling: Secondary | ICD-10-CM | POA: Diagnosis not present

## 2022-04-07 DIAGNOSIS — G8929 Other chronic pain: Secondary | ICD-10-CM | POA: Diagnosis not present

## 2022-04-07 DIAGNOSIS — K219 Gastro-esophageal reflux disease without esophagitis: Secondary | ICD-10-CM | POA: Diagnosis not present

## 2022-04-07 DIAGNOSIS — I482 Chronic atrial fibrillation, unspecified: Secondary | ICD-10-CM | POA: Diagnosis not present

## 2022-04-08 ENCOUNTER — Telehealth: Payer: Self-pay | Admitting: Cardiology

## 2022-04-08 NOTE — Telephone Encounter (Signed)
Patient needs to speak to [facility] about her upcoming appt.

## 2022-04-08 NOTE — Telephone Encounter (Signed)
No the labs at primary care should be adequate  Mae Denunzio [name] MD, [facility]

## 2022-04-08 NOTE — Telephone Encounter (Signed)
Called patient who states that she wanted to check and see if she needs labs done prior to her next office visit with Dr. [name] on 6/12. Patient just recently had blood work done on 5/9 with her primary care. She had CBC, CMP, and Lipid and these are accessible through Epic. Patient would like to know if Dr. [name] wants any other blood work prior to appointment. Advised patient I would forward message to Dr. [name] for him to review and advise. Patient verbalized understanding.

## 2022-04-08 NOTE — Telephone Encounter (Signed)
Returned call to patient and made her aware that no other blood work was needed. Patient verbalized understanding.

## 2022-04-09 DIAGNOSIS — S92511D Displaced fracture of proximal phalanx of right lesser toe(s), subsequent encounter for fracture with routine healing: Secondary | ICD-10-CM | POA: Diagnosis not present

## 2022-04-09 DIAGNOSIS — D519 Vitamin B12 deficiency anemia, unspecified: Secondary | ICD-10-CM | POA: Diagnosis not present

## 2022-04-09 DIAGNOSIS — S22080D Wedge compression fracture of T11-T12 vertebra, subsequent encounter for fracture with routine healing: Secondary | ICD-10-CM | POA: Diagnosis not present

## 2022-04-09 DIAGNOSIS — G8929 Other chronic pain: Secondary | ICD-10-CM | POA: Diagnosis not present

## 2022-04-09 DIAGNOSIS — I482 Chronic atrial fibrillation, unspecified: Secondary | ICD-10-CM | POA: Diagnosis not present

## 2022-04-09 DIAGNOSIS — M6259 Muscle wasting and atrophy, not elsewhere classified, multiple sites: Secondary | ICD-10-CM | POA: Diagnosis not present

## 2022-04-09 DIAGNOSIS — Z7901 Long term (current) use of anticoagulants: Secondary | ICD-10-CM | POA: Diagnosis not present

## 2022-04-09 DIAGNOSIS — Z96643 Presence of artificial hip joint, bilateral: Secondary | ICD-10-CM | POA: Diagnosis not present

## 2022-04-09 DIAGNOSIS — I10 Essential (primary) hypertension: Secondary | ICD-10-CM | POA: Diagnosis not present

## 2022-04-09 DIAGNOSIS — E785 Hyperlipidemia, unspecified: Secondary | ICD-10-CM | POA: Diagnosis not present

## 2022-04-09 DIAGNOSIS — Z9181 History of falling: Secondary | ICD-10-CM | POA: Diagnosis not present

## 2022-04-09 DIAGNOSIS — N329 Bladder disorder, unspecified: Secondary | ICD-10-CM | POA: Diagnosis not present

## 2022-04-09 DIAGNOSIS — K219 Gastro-esophageal reflux disease without esophagitis: Secondary | ICD-10-CM | POA: Diagnosis not present

## 2022-04-09 DIAGNOSIS — Z79891 Long term (current) use of opiate analgesic: Secondary | ICD-10-CM | POA: Diagnosis not present

## 2022-04-12 DIAGNOSIS — N329 Bladder disorder, unspecified: Secondary | ICD-10-CM | POA: Diagnosis not present

## 2022-04-12 DIAGNOSIS — K219 Gastro-esophageal reflux disease without esophagitis: Secondary | ICD-10-CM | POA: Diagnosis not present

## 2022-04-12 DIAGNOSIS — Z96643 Presence of artificial hip joint, bilateral: Secondary | ICD-10-CM | POA: Diagnosis not present

## 2022-04-12 DIAGNOSIS — Z79891 Long term (current) use of opiate analgesic: Secondary | ICD-10-CM | POA: Diagnosis not present

## 2022-04-12 DIAGNOSIS — G8929 Other chronic pain: Secondary | ICD-10-CM | POA: Diagnosis not present

## 2022-04-12 DIAGNOSIS — S22080D Wedge compression fracture of T11-T12 vertebra, subsequent encounter for fracture with routine healing: Secondary | ICD-10-CM | POA: Diagnosis not present

## 2022-04-12 DIAGNOSIS — E785 Hyperlipidemia, unspecified: Secondary | ICD-10-CM | POA: Diagnosis not present

## 2022-04-12 DIAGNOSIS — Z7901 Long term (current) use of anticoagulants: Secondary | ICD-10-CM | POA: Diagnosis not present

## 2022-04-12 DIAGNOSIS — I10 Essential (primary) hypertension: Secondary | ICD-10-CM | POA: Diagnosis not present

## 2022-04-12 DIAGNOSIS — I482 Chronic atrial fibrillation, unspecified: Secondary | ICD-10-CM | POA: Diagnosis not present

## 2022-04-12 DIAGNOSIS — D519 Vitamin B12 deficiency anemia, unspecified: Secondary | ICD-10-CM | POA: Diagnosis not present

## 2022-04-12 DIAGNOSIS — M6259 Muscle wasting and atrophy, not elsewhere classified, multiple sites: Secondary | ICD-10-CM | POA: Diagnosis not present

## 2022-04-12 DIAGNOSIS — Z9181 History of falling: Secondary | ICD-10-CM | POA: Diagnosis not present

## 2022-04-12 DIAGNOSIS — S92511D Displaced fracture of proximal phalanx of right lesser toe(s), subsequent encounter for fracture with routine healing: Secondary | ICD-10-CM | POA: Diagnosis not present

## 2022-04-13 DIAGNOSIS — Z7901 Long term (current) use of anticoagulants: Secondary | ICD-10-CM | POA: Diagnosis not present

## 2022-04-13 DIAGNOSIS — Z96643 Presence of artificial hip joint, bilateral: Secondary | ICD-10-CM | POA: Diagnosis not present

## 2022-04-13 DIAGNOSIS — I10 Essential (primary) hypertension: Secondary | ICD-10-CM | POA: Diagnosis not present

## 2022-04-13 DIAGNOSIS — G8929 Other chronic pain: Secondary | ICD-10-CM | POA: Diagnosis not present

## 2022-04-13 DIAGNOSIS — S92511D Displaced fracture of proximal phalanx of right lesser toe(s), subsequent encounter for fracture with routine healing: Secondary | ICD-10-CM | POA: Diagnosis not present

## 2022-04-13 DIAGNOSIS — E785 Hyperlipidemia, unspecified: Secondary | ICD-10-CM | POA: Diagnosis not present

## 2022-04-13 DIAGNOSIS — D519 Vitamin B12 deficiency anemia, unspecified: Secondary | ICD-10-CM | POA: Diagnosis not present

## 2022-04-13 DIAGNOSIS — I482 Chronic atrial fibrillation, unspecified: Secondary | ICD-10-CM | POA: Diagnosis not present

## 2022-04-13 DIAGNOSIS — N329 Bladder disorder, unspecified: Secondary | ICD-10-CM | POA: Diagnosis not present

## 2022-04-13 DIAGNOSIS — K219 Gastro-esophageal reflux disease without esophagitis: Secondary | ICD-10-CM | POA: Diagnosis not present

## 2022-04-13 DIAGNOSIS — S22080D Wedge compression fracture of T11-T12 vertebra, subsequent encounter for fracture with routine healing: Secondary | ICD-10-CM | POA: Diagnosis not present

## 2022-04-13 DIAGNOSIS — Z79891 Long term (current) use of opiate analgesic: Secondary | ICD-10-CM | POA: Diagnosis not present

## 2022-04-13 DIAGNOSIS — Z9181 History of falling: Secondary | ICD-10-CM | POA: Diagnosis not present

## 2022-04-13 DIAGNOSIS — M6259 Muscle wasting and atrophy, not elsewhere classified, multiple sites: Secondary | ICD-10-CM | POA: Diagnosis not present

## 2022-04-14 NOTE — Progress Notes (Unsigned)
[name] Date of Birth: 1932/08/25   History of Present Illness: [name] is seen for follow up Afib. She has a history of chronic atrial fibrillation, hypertensive heart disease, and pulmonary hypertension. She has a history of chronic back problems. She has moderate MR and COPD. In October 2019 she developed progressive weakness in her legs. We held her lipitor without improvement. MRI was done. This showed some chronic fatty atrophy of the muscles c/w denervation in the peroneal nerve distribution. We had discussed switching to a DOAC but she could not afford it. INR is followed in [place].  She is followed by podiatry for evaluation of a left foot ulcer. LE arterial dopplers done as noted below. She was seen by [name] and underwent angiography in June 2021. This  showed no significant aortoiliac disease.  There was mild to moderate left SFA and popliteal disease with one-vessel runoff below the knee via the peroneal artery which was free of significant disease.  There was mild disease in the TP trunk.  The anterior tibial and posterior tibial arteries were both occluded with reconstitution distally via the communicating branches of the peroneal artery with intact pedal arch.  He attempted revascularization of the anterior tibial artery to give her more blood flow but was not able to cross the occlusion antegrade given the length of the occlusion. Medical management recommended.   On follow up today she is doing OK. Denies any chest pain. Breathing is doing well. No palpitations or swelling. No new foot ulcers.  She was admitted to the hospital in [place] in April following a fall with vertebral fractures. Had vertebroplasty. States she is really fearful of falling again.    Current Outpatient Medications on File Prior to Visit  Medication Sig Dispense Refill   ascorbic acid (VITAMIN C) 500 MG tablet Take 600 mg by mouth daily.      atorvastatin (LIPITOR) 80 MG tablet Take 1  tablet (80 mg total) by mouth daily. 90 tablet 3   Biotin 1000 MCG tablet Take 1 tablet by mouth daily.      Calcium Carbonate-Vitamin D (CALCIUM + D PO) Take by mouth daily.     Cholecalciferol (VITAMIN D PO) Take by mouth daily.     cyanocobalamin (,VITAMIN B-12,) 1000 MCG/ML injection Inject 1 mL (1,000 mcg total) into the muscle every 30 (thirty) days. 10 mL 3   diltiazem (CARDIZEM CD) 120 MG 24 hr capsule Take 120 mg by mouth daily.     gabapentin (NEURONTIN) 300 MG capsule Take by mouth.     HYDROcodone-acetaminophen (NORCO) 10-325 MG per tablet Take 1 tablet by mouth every 6 (six) hours as needed for moderate pain.      lidocaine (LIDODERM) 5 % 1 patch daily.     lisinopril (ZESTRIL) 20 MG tablet Take 1 tablet (20 mg total) by mouth daily. 90 tablet 2   oxybutynin (DITROPAN) 5 MG tablet Take 1 tablet (5 mg total) by mouth 3 (three) times daily. 90 tablet 2   oxyCODONE-acetaminophen (PERCOCET) 10-325 MG tablet Take 1 tablet by mouth 2 (two) times daily as needed.     pantoprazole (PROTONIX) 40 MG tablet Take 40 mg by mouth daily.     potassium chloride SA (KLOR-CON) 20 MEQ tablet TAKE ONE TABLET BY MOUTH TWICE DAILY 180 tablet 3   predniSONE (DELTASONE) 10 MG tablet Take by mouth.     sertraline (ZOLOFT) 50 MG tablet Take 75 mg by mouth daily.  vitamin A 8000 UNIT capsule Take 8,000 Units by mouth daily.     warfarin (COUMADIN) 2.5 MG tablet Take 1/2 tablet to 1 tablet daily by mouth or as directed by Anticoagulation Clinic. 30 tablet 1   No current facility-administered medications on file prior to visit.    Allergies  Allergen Reactions   Sulfanilamide     unkn   Penicillins Rash    Past Medical History:  Diagnosis Date   Arthritis    Back pain, chronic    Coronary artery disease CARDIOLOGIST- DR [name]--  LOV IN EPIC   Depression    Hammer toe    Hypercholesterolemia    Hypertensive heart disease    WITH LVH   LVH (left ventricular hypertrophy) due to hypertensive  disease    Mitral insufficiency    CHRONIC   Pulmonary hypertension ([place])    Transfusion history     Past Surgical History:  Procedure Laterality Date   ABDOMINAL AORTOGRAM W/LOWER EXTREMITY N/A 04/29/2020   Procedure: ABDOMINAL AORTOGRAM W/LOWER EXTREMITY;  Surgeon: [name], MD;  Location: [place] [facility];  Service: Cardiovascular;  Laterality: N/A;  Lt Leg   ABDOMINAL HYSTERECTOMY     ANTERIOR REPAIR/ [place] PUBOVAGINAL SLING  05-03-2002   BUNIONECTOMY WITH HAMMERTOE RECONSTRUCTION Right 03/01/2013   Procedure: RIGHT FOOT EXCISION BUNIONETTE AND FUSION OF DIP FOURTH TOE;  Surgeon: [name], MD;  Location: [facility];  Service: Orthopedics;  Laterality: Right;   CARDIAC CATHETERIZATION  02-03-2009  DR  [name]   SINGLE-VESSEL OBSTRUCTIVE CAD, SMALL DIAGONAL BRANCH/ NORMAL LVF/ SEVERE MITRAL INSUFFICIENCY/ SEVERE PULMONARY HYPERTENSION   CATARACT EXTRACTION W/ INTRAOCULAR LENS  IMPLANT, BILATERAL  2003   COLONOSCOPY  07/25/2014   few mall scattered diverticula in the ectire examined colon. Small internal hemmorhoids.    HAMMER TOE SURGERY  02/28/2012   Procedure: HAMMER TOE CORRECTION;  Surgeon: [name], MD;  Location: [facility];  Service: Orthopedics;  Laterality: Right;  FUSION OF PROXIMAL INTERPHALANGEAL  RIGHT THIRD CLAW TOE   PARS PLANA VITRECTOMY W/ REPAIR OF MACULAR HOLE  02-04-2002   LEFT EYE   PERIPHERAL VASCULAR BALLOON ANGIOPLASTY  04/29/2020   Procedure: PERIPHERAL VASCULAR BALLOON ANGIOPLASTY;  Surgeon: [name], MD;  Location: [place] [facility];  Service: Cardiovascular;;  Lt. AT   REPAIR RIGHT SECOND CLAW TOE/ VARUS MEDIAL CLOSING WEDGE OSTEOTOMY PROXIMAL PHALANX RIGHT GREAT TOE  05-05-2005   TOTAL HIP ARTHROPLASTY     BILATERAL---  LEFT 4/98;   RIGHT  11/98   VARUS OSTEOTOMY PROXIMAL PHALANX, LEFT GREAT TOE/ PARING DOWN THE CALLUS , SECOND LEFT TOE  10-27-2008    Social History   Tobacco Use   Smoking Status Never  Smokeless Tobacco Never    Social History   Substance and Sexual Activity  Alcohol Use Not Currently   Comment: occasional    Family History  Problem Relation Age of Onset   Heart disease Mother 65   Prostate cancer Father 64    Review of Systems:  As noted in history of present illness.  All other systems were reviewed and are negative.  Physical Exam: BP (!) 152/80   Pulse 97   Ht '5\' 4"'$  (1.626 m)   Wt 147 lb (66.7 kg)   SpO2 98%   BMI 25.23 kg/m  GENERAL:  Well appearing WF in NAD HEENT:  PERRL, EOMI, sclera are clear. Oropharynx is clear. NECK:  No jugular venous distention, carotid upstroke  brisk and symmetric, no bruits, no thyromegaly or adenopathy LUNGS:  Clear to auscultation bilaterally CHEST:  Unremarkable HEART:  RRR,  PMI not displaced or sustained,S1 and S2 within normal limits, no S3, no S4: no clicks, no rubs, gr 2/6 systolic murmur apex.  ABD:  Soft, nontender. BS +, no masses or bruits. No hepatomegaly, no splenomegaly EXT: poor pedal pulses, no edema, venous varicosities. There a a calloused area over the lateral metatarsal joint on the left with ulceration. No redness or drainage.  SKIN:  Warm and dry.  No rashes NEURO:  Alert and oriented x 3. Cranial nerves II through XII intact. PSYCH:  Cognitively intact     LABORATORY DATA:  Lab Results  Component Value Date   WBC 6.4 03/15/2022   HGB 12.7 03/15/2022   HCT 39.6 03/15/2022   PLT 234 03/15/2022   GLUCOSE 101 (H) 03/15/2022   CHOL 135 03/15/2022   TRIG 135 03/15/2022   HDL 48 03/15/2022   LDLCALC 63 03/15/2022   ALT 20 03/15/2022   AST 33 03/15/2022   NA 142 03/15/2022   K 4.5 03/15/2022   CL 104 03/15/2022   CREATININE 0.83 03/15/2022   BUN 10 03/15/2022   CO2 21 03/15/2022   TSH 2.560 12/09/2021   INR 2.0 03/29/2022   HGBA1C 5.9 (H) 03/15/2022   MICROALBUR 30 05/13/2020    Labs reviewed from 02/24/17: cholesterol 174, triglycerides 110, LDL 90, HDL 62.  A1c 6.0%. CMET and TSH normal. Dated 08/24/17: cholesterol 157, triglycerides 106, HDL 55, LDL 90. A1c 6.1%. CMET and CBC normal  Ecg today shows Afib with rate 97 bpm. Possible old septal infarct. Nonspecific ST abnormality.   LE arterial dopplers 03/06/20:  Summary:  Left: Moderate progression is noted compared to previous study.   Duplex imaging showed heterogenous plaque in the CFA, DFA, SFA, popliteal  artery and TPT. Medial calcifications noted in the tibial vessels.  50-74% stenosis in the TPT, based on velocity ratio.  Probable one vessel run-off via peroneal artery.  Total occlusion noted in the PTA.  Occluded proximal to mid ATA.   LE ABIs 03/06/20: Right ABIs appear increased compared to prior study on 02/21/2013. Left  ABIs appear essentially unchanged compared to prior study on 02/21/2013.  Left ABIs are unreliable; arterial wall calcification precludes accurate  ankle pressure and ABIs. Right TBIs  are essentially unchanged; left TBIs have decreased.  Summary:  Right: Resting right ankle-brachial index indicates noncompressible right  lower extremity arteries. The right toe-brachial index is normal.   Left: The left toe-brachial index is abnormal. ABIs are unreliable.  Arterial wall calcification precludes accurate ankle pressure and ABIs.      Assessment / Plan: 1. Atrial fibrillation, permanent. Rate is  controlled on diltiazem. She is anticoagulated on Coumadin. Check INR today.  2. Mild to Moderate MR. Last Echo 8/16. Asymptomatic. Exam is unchanged.   3. Hypertension with hypertensive heart disease, BP is controlled.  4. Hyperlipidemia on statin therapy. Last LDL 63.    On lipitor at 80 mg daily.  Encourage healthy eating.   5. Pulmonary hypertension-moderate  6. Coronary disease. Cardiac catheterization in 2010 showed severe stenosis in a small diagonal branch. She otherwise had no significant disease. She is asymptomatic.   7. Chronic bronchitis and COPD.  Chronic dyspnea. Will continue Spiriva.   8. PAD - has single vessel run off below the knee. Has nonhealing ulcer on the left. Unable to cross occlusion percutaneously. Continue medical management. Ulcer is stable.  Follow up in 6 months

## 2022-04-18 ENCOUNTER — Encounter: Payer: Self-pay | Admitting: Cardiology

## 2022-04-18 ENCOUNTER — Ambulatory Visit (INDEPENDENT_AMBULATORY_CARE_PROVIDER_SITE_OTHER): Payer: Medicare Other | Admitting: Cardiology

## 2022-04-18 ENCOUNTER — Ambulatory Visit (INDEPENDENT_AMBULATORY_CARE_PROVIDER_SITE_OTHER): Payer: Medicare Other | Admitting: *Deleted

## 2022-04-18 ENCOUNTER — Ambulatory Visit: Payer: Medicare Other | Admitting: Cardiology

## 2022-04-18 VITALS — BP 152/80 | HR 97 | Ht 64.0 in | Wt 147.0 lb

## 2022-04-18 DIAGNOSIS — I482 Chronic atrial fibrillation, unspecified: Secondary | ICD-10-CM

## 2022-04-18 DIAGNOSIS — J410 Simple chronic bronchitis: Secondary | ICD-10-CM

## 2022-04-18 DIAGNOSIS — Z7901 Long term (current) use of anticoagulants: Secondary | ICD-10-CM

## 2022-04-18 DIAGNOSIS — I739 Peripheral vascular disease, unspecified: Secondary | ICD-10-CM | POA: Diagnosis not present

## 2022-04-18 DIAGNOSIS — G894 Chronic pain syndrome: Secondary | ICD-10-CM | POA: Diagnosis not present

## 2022-04-18 DIAGNOSIS — Z79891 Long term (current) use of opiate analgesic: Secondary | ICD-10-CM | POA: Diagnosis not present

## 2022-04-18 DIAGNOSIS — M545 Low back pain, unspecified: Secondary | ICD-10-CM | POA: Diagnosis not present

## 2022-04-18 DIAGNOSIS — Z5181 Encounter for therapeutic drug level monitoring: Secondary | ICD-10-CM | POA: Diagnosis not present

## 2022-04-18 LAB — POCT INR: INR: 1.8 — AB (ref 2.0–3.0)

## 2022-04-18 NOTE — Patient Instructions (Signed)
Description   Today take 2.'5mg'$  (1 tablet) then continue 2.'5mg'$  daily except 1.'25mg'$  on Mondays, Wednesdays, and Fridays. Stay consistent with greens (2 times per week). Repeat INR in 4 weeks due to [place]. Coumadin Clinic: [phone]

## 2022-04-18 NOTE — Patient Instructions (Signed)
Medication Instructions:  Your physician recommends that you continue on your current medications as directed. Please refer to the Current Medication list given to you today.  *If you need a refill on your cardiac medications before your next appointment, please call your pharmacy*   Follow-Up: At [facility], you and your health needs are our priority.  As part of our continuing mission to provide you with exceptional heart care, we have created designated Provider Care Teams.  These Care Teams include your primary Cardiologist (physician) and Advanced Practice Providers (APPs -  Physician Assistants and Nurse Practitioners) who all work together to provide you with the care you need, when you need it.  We recommend signing up for the patient portal called "MyChart".  Sign up information is provided on this After Visit Summary.  MyChart is used to connect with patients for Virtual Visits (Telemedicine).  Patients are able to view lab/test results, encounter notes, upcoming appointments, etc.  Non-urgent messages can be sent to your provider as well.   To learn more about what you can do with MyChart, go to [email or web address].    Your next appointment:   6 month(s)  The format for your next appointment:   In Person  Provider:   Dr. [name]

## 2022-04-19 DIAGNOSIS — S22080D Wedge compression fracture of T11-T12 vertebra, subsequent encounter for fracture with routine healing: Secondary | ICD-10-CM | POA: Diagnosis not present

## 2022-04-19 DIAGNOSIS — Z7901 Long term (current) use of anticoagulants: Secondary | ICD-10-CM | POA: Diagnosis not present

## 2022-04-19 DIAGNOSIS — Z96643 Presence of artificial hip joint, bilateral: Secondary | ICD-10-CM | POA: Diagnosis not present

## 2022-04-19 DIAGNOSIS — I482 Chronic atrial fibrillation, unspecified: Secondary | ICD-10-CM | POA: Diagnosis not present

## 2022-04-19 DIAGNOSIS — S92511D Displaced fracture of proximal phalanx of right lesser toe(s), subsequent encounter for fracture with routine healing: Secondary | ICD-10-CM | POA: Diagnosis not present

## 2022-04-19 DIAGNOSIS — K219 Gastro-esophageal reflux disease without esophagitis: Secondary | ICD-10-CM | POA: Diagnosis not present

## 2022-04-19 DIAGNOSIS — G8929 Other chronic pain: Secondary | ICD-10-CM | POA: Diagnosis not present

## 2022-04-19 DIAGNOSIS — Z9181 History of falling: Secondary | ICD-10-CM | POA: Diagnosis not present

## 2022-04-19 DIAGNOSIS — M6259 Muscle wasting and atrophy, not elsewhere classified, multiple sites: Secondary | ICD-10-CM | POA: Diagnosis not present

## 2022-04-19 DIAGNOSIS — N329 Bladder disorder, unspecified: Secondary | ICD-10-CM | POA: Diagnosis not present

## 2022-04-19 DIAGNOSIS — E785 Hyperlipidemia, unspecified: Secondary | ICD-10-CM | POA: Diagnosis not present

## 2022-04-19 DIAGNOSIS — I10 Essential (primary) hypertension: Secondary | ICD-10-CM | POA: Diagnosis not present

## 2022-04-19 DIAGNOSIS — Z79891 Long term (current) use of opiate analgesic: Secondary | ICD-10-CM | POA: Diagnosis not present

## 2022-04-19 DIAGNOSIS — D519 Vitamin B12 deficiency anemia, unspecified: Secondary | ICD-10-CM | POA: Diagnosis not present

## 2022-04-20 DIAGNOSIS — Z79891 Long term (current) use of opiate analgesic: Secondary | ICD-10-CM | POA: Diagnosis not present

## 2022-04-20 DIAGNOSIS — M6259 Muscle wasting and atrophy, not elsewhere classified, multiple sites: Secondary | ICD-10-CM | POA: Diagnosis not present

## 2022-04-20 DIAGNOSIS — Z96643 Presence of artificial hip joint, bilateral: Secondary | ICD-10-CM | POA: Diagnosis not present

## 2022-04-20 DIAGNOSIS — S92511D Displaced fracture of proximal phalanx of right lesser toe(s), subsequent encounter for fracture with routine healing: Secondary | ICD-10-CM | POA: Diagnosis not present

## 2022-04-20 DIAGNOSIS — Z7901 Long term (current) use of anticoagulants: Secondary | ICD-10-CM | POA: Diagnosis not present

## 2022-04-20 DIAGNOSIS — N329 Bladder disorder, unspecified: Secondary | ICD-10-CM | POA: Diagnosis not present

## 2022-04-20 DIAGNOSIS — Z9181 History of falling: Secondary | ICD-10-CM | POA: Diagnosis not present

## 2022-04-20 DIAGNOSIS — I482 Chronic atrial fibrillation, unspecified: Secondary | ICD-10-CM | POA: Diagnosis not present

## 2022-04-20 DIAGNOSIS — I10 Essential (primary) hypertension: Secondary | ICD-10-CM | POA: Diagnosis not present

## 2022-04-20 DIAGNOSIS — G8929 Other chronic pain: Secondary | ICD-10-CM | POA: Diagnosis not present

## 2022-04-20 DIAGNOSIS — D519 Vitamin B12 deficiency anemia, unspecified: Secondary | ICD-10-CM | POA: Diagnosis not present

## 2022-04-20 DIAGNOSIS — K219 Gastro-esophageal reflux disease without esophagitis: Secondary | ICD-10-CM | POA: Diagnosis not present

## 2022-04-20 DIAGNOSIS — S22080D Wedge compression fracture of T11-T12 vertebra, subsequent encounter for fracture with routine healing: Secondary | ICD-10-CM | POA: Diagnosis not present

## 2022-04-20 DIAGNOSIS — E785 Hyperlipidemia, unspecified: Secondary | ICD-10-CM | POA: Diagnosis not present

## 2022-04-27 DIAGNOSIS — N329 Bladder disorder, unspecified: Secondary | ICD-10-CM | POA: Diagnosis not present

## 2022-04-27 DIAGNOSIS — Z7901 Long term (current) use of anticoagulants: Secondary | ICD-10-CM | POA: Diagnosis not present

## 2022-04-27 DIAGNOSIS — I10 Essential (primary) hypertension: Secondary | ICD-10-CM | POA: Diagnosis not present

## 2022-04-27 DIAGNOSIS — E785 Hyperlipidemia, unspecified: Secondary | ICD-10-CM | POA: Diagnosis not present

## 2022-04-27 DIAGNOSIS — S92511D Displaced fracture of proximal phalanx of right lesser toe(s), subsequent encounter for fracture with routine healing: Secondary | ICD-10-CM | POA: Diagnosis not present

## 2022-04-27 DIAGNOSIS — I482 Chronic atrial fibrillation, unspecified: Secondary | ICD-10-CM | POA: Diagnosis not present

## 2022-04-27 DIAGNOSIS — K219 Gastro-esophageal reflux disease without esophagitis: Secondary | ICD-10-CM | POA: Diagnosis not present

## 2022-04-27 DIAGNOSIS — S22080D Wedge compression fracture of T11-T12 vertebra, subsequent encounter for fracture with routine healing: Secondary | ICD-10-CM | POA: Diagnosis not present

## 2022-04-27 DIAGNOSIS — Z79891 Long term (current) use of opiate analgesic: Secondary | ICD-10-CM | POA: Diagnosis not present

## 2022-04-27 DIAGNOSIS — M6259 Muscle wasting and atrophy, not elsewhere classified, multiple sites: Secondary | ICD-10-CM | POA: Diagnosis not present

## 2022-04-27 DIAGNOSIS — D519 Vitamin B12 deficiency anemia, unspecified: Secondary | ICD-10-CM | POA: Diagnosis not present

## 2022-04-27 DIAGNOSIS — Z96643 Presence of artificial hip joint, bilateral: Secondary | ICD-10-CM | POA: Diagnosis not present

## 2022-04-27 DIAGNOSIS — Z9181 History of falling: Secondary | ICD-10-CM | POA: Diagnosis not present

## 2022-04-27 DIAGNOSIS — G8929 Other chronic pain: Secondary | ICD-10-CM | POA: Diagnosis not present

## 2022-04-29 ENCOUNTER — Telehealth: Payer: Self-pay

## 2022-04-29 NOTE — Chronic Care Management (AMB) (Signed)
    Chronic Care Management Pharmacy Assistant   Name: [name]  MRN: [id number] DOB: [birth year]   Reason for Encounter: Disease State/ General  Recent office visits:  03-15-2022 [name], MD. Glucose= 101. A1C= 5.9. Referral placed to home health and podiatry.  03-02-2022 Erie Noe, LPN. Medicare annual wellness visit.  Recent consult visits:  04-18-2022 Marcos Eke, RN (Cardiology). Anti coag visit.  04-18-2022 [name], Peter M, MD (Cardiology). EKG completed.  03-29-2022 Frederik Schmidt, RN (Cardiology). Anti coag visit.  03-22-2022 [name], RN (Cardiology). Anti coag visit.  03-17-2022 Troy Sine, MD (Cardiology). Anti coag visit.  03-08-2022 Hemphill, Myrtis Hopping, RN (Cardiology). Anti coag visit.  02-28-2022  Hemphill, Myrtis Hopping, RN (Cardiology). Anti coag visit.  01-17-2022 Nehemiah [facility]). Unable to view encounter.  01-04-2022 [name], RN (Cardiology). Anti coag visit.  12-30-2021 Jolene Schimke (Dermatology). Unable to view encounter. Hospital visits:  None in previous 6 months  Medications: Outpatient Encounter Medications as of 04/29/2022  Medication Sig   ascorbic acid (VITAMIN C) 500 MG tablet Take 600 mg by mouth daily.    atorvastatin (LIPITOR) 80 MG tablet Take 1 tablet (80 mg total) by mouth daily.   Biotin 1000 MCG tablet Take 1 tablet by mouth daily.    Calcium Carbonate-Vitamin D (CALCIUM + D PO) Take by mouth daily.   Cholecalciferol (VITAMIN D PO) Take by mouth daily.   cyanocobalamin (,VITAMIN B-12,) 1000 MCG/ML injection Inject 1 mL (1,000 mcg total) into the muscle every 30 (thirty) days.   diltiazem (CARDIZEM CD) 120 MG 24 hr capsule Take 120 mg by mouth daily.   gabapentin (NEURONTIN) 300 MG capsule Take by mouth.   HYDROcodone-acetaminophen (NORCO) 10-325 MG per tablet Take 1 tablet by mouth every 6 (six) hours as needed for moderate pain.    lidocaine (LIDODERM) 5 % 1  patch daily.   lisinopril (ZESTRIL) 20 MG tablet Take 1 tablet (20 mg total) by mouth daily.   oxybutynin (DITROPAN) 5 MG tablet Take 1 tablet (5 mg total) by mouth 3 (three) times daily.   oxyCODONE-acetaminophen (PERCOCET) 10-325 MG tablet Take 1 tablet by mouth 2 (two) times daily as needed.   pantoprazole (PROTONIX) 40 MG tablet Take 40 mg by mouth daily.   potassium chloride SA (KLOR-CON) 20 MEQ tablet TAKE ONE TABLET BY MOUTH TWICE DAILY   predniSONE (DELTASONE) 10 MG tablet Take by mouth.   sertraline (ZOLOFT) 50 MG tablet Take 75 mg by mouth daily.    vitamin A 8000 UNIT capsule Take 8,000 Units by mouth daily.   warfarin (COUMADIN) 2.5 MG tablet Take 1/2 tablet to 1 tablet daily by mouth or as directed by Anticoagulation Clinic.   No facility-administered encounter medications on file as of 04/29/2022.   04-29-2022: 1st attempt left VM 05-02-2022: 2nd attempt left VM 05-05-2022: 3rd attempt left VM  Care Gaps: None  Star Rating Drugs: Atorvastatin 80 mg- Last filled 02-09-2022 30 DS Lisinopril 20 mg- Last filled 03-04-2022 90 DS  [place] Clinical Pharmacist Assistant [phone]

## 2022-05-11 ENCOUNTER — Telehealth: Payer: Medicare Other

## 2022-05-11 ENCOUNTER — Other Ambulatory Visit: Payer: Self-pay

## 2022-05-11 DIAGNOSIS — E1142 Type 2 diabetes mellitus with diabetic polyneuropathy: Secondary | ICD-10-CM

## 2022-05-17 ENCOUNTER — Ambulatory Visit (INDEPENDENT_AMBULATORY_CARE_PROVIDER_SITE_OTHER): Payer: Medicare Other

## 2022-05-17 ENCOUNTER — Other Ambulatory Visit: Payer: Self-pay

## 2022-05-17 DIAGNOSIS — I4811 Longstanding persistent atrial fibrillation: Secondary | ICD-10-CM

## 2022-05-17 DIAGNOSIS — Z7901 Long term (current) use of anticoagulants: Secondary | ICD-10-CM | POA: Diagnosis not present

## 2022-05-17 DIAGNOSIS — R7303 Prediabetes: Secondary | ICD-10-CM

## 2022-05-17 DIAGNOSIS — E1140 Type 2 diabetes mellitus with diabetic neuropathy, unspecified: Secondary | ICD-10-CM

## 2022-05-17 DIAGNOSIS — I10 Essential (primary) hypertension: Secondary | ICD-10-CM

## 2022-05-17 DIAGNOSIS — I482 Chronic atrial fibrillation, unspecified: Secondary | ICD-10-CM | POA: Diagnosis not present

## 2022-05-17 LAB — POCT INR: INR: 6.9 — AB (ref 2.0–3.0)

## 2022-05-17 MED ORDER — WARFARIN SODIUM 2.5 MG PO TABS: ORAL_TABLET | ORAL | 0 refills | Status: DC

## 2022-05-17 NOTE — Progress Notes (Signed)
Chronic Care Management Pharmacy Note  05/17/2022 Name:  [name] MRN:  [id number] DOB:  10/12/1932  Summary: -Pleasant 88 year old female presents for initial CCM visit. She was born in [place], near [place]. Her mother/Father divorced when she was 69.21 years old so she moved to [place] with her mom (That's where her mom grew up) and visited her father occasionally. 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 She loves to walk, be outdoors, and go to parks. In the winter she loves to Ski (After she had Total Hip Sx x2, she changed to [employer]) and ice skate. In the summer, she loves going to parks and walking. Now that she is using a walker, she in unable to get outside as much. She moved to [place] to be with her two daughters so they could help her. However, the first daughter fractured her Achilles Tendon and was bed-ridden for 4 months and the other daughter is having walking issues. Patient now spends time watching her favourite sports (Rams #1, [employer] #2, in addition to tennis and golf). She also loves to knit and crochet. She hasn't heard from her son in 21 years. She doesn't like  because she dislikes the summers and can't get outside in the winters to have fun  Recommendations/Changes made from today's visit: -Patient would like to discuss increasing SSRI at f/u with PCP   Subjective: [name] is an 88 y.o. year old female who is a primary patient of Marina Goodell, Mickle Mallory, MD.  The CCM team was consulted for assistance with disease management and care coordination needs.    Engaged with patient by telephone for initial visit in response to provider referral for pharmacy case management and/or care coordination services.   Consent to Services:  The patient was given the following information about Chronic Care Management services today, agreed to services, and gave verbal consent: 1. CCM service includes personalized  support from designated clinical staff supervised by the primary care provider, including individualized plan of care and coordination with other care providers 2. 24/7 contact phone numbers for assistance for urgent and routine care needs. 3. Service will only be billed when office clinical staff spend 20 minutes or more in a month to coordinate care. 4. Only one practitioner may furnish and bill the service in a calendar month. 5.The patient may stop CCM services at any time (effective at the end of the month) by phone call to the office staff. 6. The patient will be responsible for cost sharing (co-pay) of up to 20% of the service fee (after annual deductible is met). Patient agreed to services and consent obtained.  Patient Care Team: Abigail Miyamoto, MD as PCP - General (Family Medicine) Mateo Flow, MD as Consulting Physician (Ophthalmology) Zettie Pho, [facility] as Pharmacist (Pharmacist) Recent office visits:  03-15-2022 Abigail Miyamoto, MD. Glucose= 101. A1C= 5.9. Referral placed to home health and podiatry.   03-02-2022 Jacklynn Bue, LPN. Medicare annual wellness visit.   Recent consult visits:  04-18-2022 Kandace Parkins, RN (Cardiology). Anti coag visit.   04-18-2022 [place], Peter M, MD (Cardiology). EKG completed.   03-29-2022 Sigurd Sos, RN (Cardiology). Anti coag visit.   03-22-2022 Memory Dance, RN (Cardiology). Anti coag visit.   03-17-2022 Lennette Bihari, MD (Cardiology). Anti coag visit.   03-08-2022 Hemphill, Verl Bangs, RN (Cardiology). Anti coag visit.   02-28-2022  Hemphill, Verl Bangs, RN (Cardiology). Anti coag visit.  01-17-2022 Nehemiah Settle Gertie Fey). Unable to view encounter.   01-04-2022 [name], RN (Cardiology). Anti coag visit.   12-30-2021 Jolene Schimke (Dermatology). Unable to view encounter. Hospital visits:  None in previous 6 months   Objective:  Lab Results  Component Value Date    CREATININE 0.83 03/15/2022   BUN 10 03/15/2022   GFR 81.95 02/23/2012   GFRNONAA 51 (L) 12/08/2020   GFRAA 59 (L) 12/08/2020   NA 142 03/15/2022   K 4.5 03/15/2022   CALCIUM 10.3 03/15/2022   CO2 21 03/15/2022   GLUCOSE 101 (H) 03/15/2022    Lab Results  Component Value Date/Time   HGBA1C 5.9 (H) 03/15/2022 02:49 PM   HGBA1C 6.5 (H) 12/09/2021 09:57 AM   GFR 81.95 02/23/2012 11:55 AM   GFR 69.71 01/26/2012 09:24 AM   MICROALBUR 30 05/13/2020 04:20 PM   MICROALBUR 30 05/08/2019 12:46 PM    Last diabetic Eye exam:  Lab Results  Component Value Date/Time   HMDIABEYEEXA No Retinopathy 12/23/2020 12:00 AM    Last diabetic Foot exam: No results found for: "HMDIABFOOTEX"   Lab Results  Component Value Date   CHOL 135 03/15/2022   HDL 48 03/15/2022   LDLCALC 63 03/15/2022   TRIG 135 03/15/2022   CHOLHDL 2.8 03/15/2022       Latest Ref Rng & Units 03/15/2022    2:49 PM 12/09/2021    9:57 AM 05/19/2021   11:26 AM  Hepatic Function  Total Protein 6.0 - 8.5 g/dL 7.2  7.0  7.7   Albumin 3.6 - 4.6 g/dL 4.2  4.3  4.7   AST 0 - 40 IU/L 33  42  40   ALT 0 - 32 IU/L 20  40  38   Alk Phosphatase 44 - 121 IU/L 119  93  99   Total Bilirubin 0.0 - 1.2 mg/dL 0.5  0.6  0.5     Lab Results  Component Value Date/Time   TSH 2.560 12/09/2021 09:57 AM   TSH 2.340 03/26/2020 10:30 AM       Latest Ref Rng & Units 03/15/2022    2:49 PM 12/09/2021    9:57 AM 05/19/2021   11:26 AM  CBC  WBC 3.4 - 10.8 x10E3/uL 6.4  5.3  5.5   Hemoglobin 11.1 - 15.9 g/dL 12.7  13.1  14.2   Hematocrit 34.0 - 46.6 % 39.6  40.9  44.1   Platelets 150 - 450 x10E3/uL 234  237  245     Lab Results  Component Value Date/Time   VD25OH 143.0 (H) 12/09/2021 09:57 AM   VD25OH 75.7 12/08/2020 11:47 AM    Clinical ASCVD: Yes  The ASCVD Risk score (Arnett DK, et al., 2019) failed to calculate for the following reasons:   The 2019 ASCVD risk score is only valid for ages 28 to 69       03/02/2022    8:09 PM  11/16/2021   10:54 AM 05/19/2021   10:39 AM  Depression screen PHQ 2/9  Decreased Interest 0 0 0  Down, Depressed, Hopeless 0 0 0  PHQ - 2 Score 0 0 0     Other: (CHADS2VASc if Afib, MMRC or CAT for COPD, ACT, DEXA)  Social History   Tobacco Use  Smoking Status Never  Smokeless Tobacco Never   BP Readings from Last 3 Encounters:  04/18/22 (!) 152/80  03/15/22 110/60  12/09/21 (!) 166/90   Pulse Readings from Last 3 Encounters:  04/18/22 97  03/15/22 (!) 120  12/09/21 97   Wt Readings from Last 3 Encounters:  04/18/22 147 lb (66.7 kg)  03/15/22 147 lb (66.7 kg)  12/09/21 163 lb (73.9 kg)   BMI Readings from Last 3 Encounters:  04/18/22 25.23 kg/m  03/15/22 25.23 kg/m  12/09/21 27.98 kg/m    Assessment/Interventions: Review of patient past medical history, allergies, medications, health status, including review of consultants reports, laboratory and other test data, was performed as part of comprehensive evaluation and provision of chronic care management services.   SDOH:  (Social Determinants of Health) assessments and interventions performed: Yes SDOH Interventions    Flowsheet Row Most Recent Value  SDOH Interventions   Financial Strain Interventions Intervention Not Indicated  Physical Activity Interventions Patient Refused      SDOH Screenings   Alcohol Screen: Low Risk  (03/02/2022)   Alcohol Screen    Last Alcohol Screening Score (AUDIT): 2  Depression (PHQ2-9): Low Risk  (03/02/2022)   Depression (PHQ2-9)    PHQ-2 Score: 0  Financial Resource Strain: Low Risk  (05/17/2022)   Overall Financial Resource Strain (CARDIA)    Difficulty of Paying Living Expenses: Not hard at all  Food Insecurity: No Food Insecurity (03/02/2022)   Hunger Vital Sign    Worried About Running Out of Food in the Last Year: Never true    Ran Out of Food in the Last Year: Never true  Housing: Low Risk  (03/02/2022)   Housing    Last Housing Risk Score: 0  Physical Activity:  Inactive (05/17/2022)   Exercise Vital Sign    Days of Exercise per Week: 0 days    Minutes of Exercise per Session: 0 min  Social Connections: Not on file  Stress: Stress Concern Present (03/02/2022)   [place]    Feeling of Stress : To some extent  Tobacco Use: Low Risk  (04/18/2022)   Patient History    Smoking Tobacco Use: Never    Smokeless Tobacco Use: Never    Passive Exposure: Not on file  Transportation Needs: Unmet Transportation Needs (03/02/2022)   PRAPARE - Transportation    Lack of Transportation (Medical): Yes    Lack of Transportation (Non-Medical): No    CCM Care Plan  Allergies  Allergen Reactions   Sulfanilamide     unkn   Penicillins Rash    Medications Reviewed Today     Reviewed by Lane Hacker, [facility] (Pharmacist) on 05/17/22 at 1437  Med List Status: <None>   Medication Order Taking? Sig Documenting Provider Last Dose Status Informant  ascorbic acid (VITAMIN C) 500 MG tablet [id number] No Take 600 mg by mouth daily.  [provider] Taking Active Self  atorvastatin (LIPITOR) 80 MG tablet [id number] No Take 1 tablet (80 mg total) by mouth daily. [name], Peter M, MD Taking Expired 04/18/22 2359   Biotin 1000 MCG tablet [id number] No Take 1 tablet by mouth daily.  [provider] Taking Active Self  Calcium Carbonate-Vitamin D (CALCIUM + D PO) [id number] No Take by mouth daily. [provider] Taking Active Self  Cholecalciferol (VITAMIN D PO) [id number] No Take by mouth daily. [provider] Taking Active Self  cyanocobalamin (,VITAMIN B-12,) 1000 MCG/ML injection [id number] No Inject 1 mL (1,000 mcg total) into the muscle every 30 (thirty) days. [name], MD Taking Active   diltiazem [facility] CD) 120 MG 24 hr capsule [id number] No Take 120 mg by mouth daily. [provider]  Taking Active   gabapentin (NEURONTIN) 300 MG capsule [id number]  No Take by mouth. [provider] Taking Active   HYDROcodone-acetaminophen (NORCO) 10-325 MG per tablet [id number] No Take 1 tablet by mouth every 6 (six) hours as needed for moderate pain.  [provider] Taking Active Self  lidocaine (LIDODERM) 5 % [id number] No 1 patch daily. [provider] Taking Active   lisinopril (ZESTRIL) 20 MG tablet [id number] No Take 1 tablet (20 mg total) by mouth daily. [name], MD Taking Active   oxybutynin [facility]) 5 MG tablet [id number] No Take 1 tablet (5 mg total) by mouth 3 (three) times daily. [name], MD Taking Active   oxyCODONE-acetaminophen [facility]) 10-325 MG tablet [id number] No Take 1 tablet by mouth 2 (two) times daily as needed. [provider] Taking Active   pantoprazole (PROTONIX) 40 MG tablet [id number] No Take 40 mg by mouth daily. [provider] Taking Active Self  potassium chloride SA (KLOR-CON) 20 MEQ tablet [id number] No TAKE ONE TABLET BY MOUTH TWICE DAILY [name], Peter M, MD Taking Active   predniSONE (DELTASONE) 10 MG tablet [id number] No Take by mouth. [provider] Taking Active   sertraline (ZOLOFT) 50 MG tablet [id number] No Take 75 mg by mouth daily.  [name], Peter M, MD Taking Active Self  vitamin A 8000 UNIT capsule [id number] No Take 8,000 Units by mouth daily. [provider] Taking Active Self  warfarin (COUMADIN) 2.5 MG tablet [id number]  Take 1/2 tablet to 1 tablet daily by mouth or as directed by Anticoagulation Clinic. [name], Peter M, MD  Active             Patient Active Problem List   Diagnosis Date Noted   Urge incontinence 05/19/2021   Foot ulcer, left, limited to breakdown of skin ([place]) 12/08/2020   PVD (peripheral vascular disease) ([place]) 11/13/2019   Prediabetes 09/25/2018   Vitamin D deficiency 09/04/2018   A-fib ([place]) 09/04/2018   Osteoarthritis of left knee 06/07/2018   Long-term current use of opiate analgesic  11/22/2017   Chronic low back pain 11/22/2017   Long term (current) use of anticoagulants [Z79.01] 05/24/2017   Menopause present 03/27/2017   Osteopenia 03/27/2017   Chronic bronchitis with COPD (chronic obstructive pulmonary disease) ([place]) 03/24/2016   Anticoagulant long-term use 02/25/2011   Mitral insufficiency    Essential hypertension    LVH (left ventricular hypertrophy) due to hypertensive disease    Pulmonary hypertension ([place])    Hyperlipidemia    Back pain, chronic    Depression     Immunization History  Administered Date(s) Administered   DTaP 04/18/2011   Fluad Quad(high Dose 65+) 08/19/2020   Influenza, High Dose Seasonal PF 08/07/2018   Influenza,inj,Quad PF,6+ Mos 08/23/2013   Influenza,inj,quad, With Preservative 08/07/2017, 08/15/2019   Influenza-Unspecified 07/08/2014, 08/15/2019, 07/26/2021   Moderna Covid-19 Vaccine Bivalent Booster 61yrs & up 08/13/2021   Moderna Sars-Covid-2 Vaccination 12/13/2019, 01/10/2020, 09/17/2020, 04/09/2021   Pneumococcal Conjugate-13 12/27/2013, 08/12/2019   Pneumococcal Polysaccharide-23 08/03/2010   Tdap 02/01/2022   Zoster Recombinat (Shingrix) 08/12/2019, 08/18/2020   Zoster, Live 04/11/2017    Conditions to be addressed/monitored:  Hypertension, Hyperlipidemia, Atrial Fibrillation, and COPD  Care Plan : [place]  Updates made by Lane Hacker, RPH since 05/17/2022 12:00 AM     Problem: COPD, Pain, AFib, Lipids   Priority: High  Onset Date: 11/16/2021     Long-Range Goal: Disease State Management   Start Date: 11/16/2021  Expected End  Date: 11/16/2022  Recent Progress: On track  Priority: High  Note:   Current Barriers:  Does not contact provider office for questions/concerns  Pharmacist Clinical Goal(s):  Patient will contact provider office for questions/concerns as evidenced notation of same in electronic health record through collaboration with PharmD and provider.   Interventions: 1:1  collaboration with [name], MD regarding development and update of comprehensive plan of care as evidenced by provider attestation and co-signature Inter-disciplinary care team collaboration (see longitudinal plan of care) Comprehensive medication review performed; medication list updated in electronic medical record  Hypertension (BP goal <140/90) BP Readings from Last 3 Encounters:  04/18/22 (!) 152/80  03/15/22 110/60  12/09/21 (!) 166/90  -Controlled -Current treatment: Diltiazem 120mg  Appropriate, Effective, Safe, Accessible Lisinopril 20mg  Appropriate, Effective, Safe, Accessible -Medications previously tried: N/A  -Current home readings: Doesn't test -Current dietary habits: "Tries to eat healthy" -Current exercise habits: None (Patient has to use a walker) -Denies hypotensive/hypertensive symptoms -Educated on BP goals and benefits of medications for prevention of heart attack, stroke and kidney damage; -Counseled to monitor BP at home PRN, document, and provide log at future appointments -Recommended to continue current medication  Hyperlipidemia: (LDL goal < 100) The ASCVD Risk score (Arnett DK, et al., 2019) failed to calculate for the following reasons:   The 2019 ASCVD risk score is only valid for ages 72 to 35 Lab Results  Component Value Date   CHOL 135 03/15/2022   CHOL 142 12/09/2021   CHOL 165 05/19/2021   Lab Results  Component Value Date   HDL 48 03/15/2022   HDL 42 12/09/2021   HDL 56 05/19/2021   Lab Results  Component Value Date   LDLCALC 63 03/15/2022   LDLCALC 75 12/09/2021   LDLCALC 89 05/19/2021   Lab Results  Component Value Date   TRIG 135 03/15/2022   TRIG 141 12/09/2021   TRIG 109 05/19/2021   Lab Results  Component Value Date   CHOLHDL 2.8 03/15/2022   CHOLHDL 3.4 12/09/2021   CHOLHDL 2.9 05/19/2021  No results found for: "LDLDIRECT" -Controlled -Current treatment: Atorvastatin 80mg  Appropriate, Effective, Safe,  Accessible -Medications previously tried: N/A  -Current dietary patterns: "Tries to eat healthy" -Current exercise habits: None -Educated on Benefits of statin for ASCVD risk reduction; -Recommended to continue current medication  Atrial Fibrillation (Goal: prevent stroke and major bleeding) -Controlled -CHADSVASC:   Jan 2023: 5 -Current treatment: Rate control:  Diltiazem 120mg  Appropriate, Effective, Safe, Accessible Lisinopril 20mg  QD Appropriate, Effective, Safe, Accessible Anticoagulation:  Warfarin UD Appropriate, Effective, Safe, Accessible -Medications previously tried: DOAC (Can't afford), Furosemide (Patient stopped due to ADR's) -Home BP and HR readings: Doesn't test  -Counseled on increased risk of stroke due to Afib and benefits of anticoagulation for stroke prevention; Jan 2023: Will let PCP know patient Dc'd Furosemide  COPD (Goal: control symptoms and prevent exacerbations) -Controlled -Current treatment  None -Medications previously tried: Tiotropium  -MMRC/CAT score:      No data to display        -Exacerbations requiring treatment in last 6 months: None -Patient denies consistent use of maintenance inhaler -Frequency of rescue inhaler use: None -Counseled on Benefits of consistent maintenance inhaler use Jan 2023: Patient is adamant she doesn't need an inhaler and didn't wish to speak further about COPD  Depression/Anxiety -Controlled -Current treatment: Sertraline 75mg  Appropriate, Effective, Safe, Accessible -Medications previously tried/failed: None -PHQ9:     03/02/2022    8:09 PM 11/16/2021   10:54 AM 05/19/2021  10:39 AM  Depression screen PHQ 2/9  Decreased Interest 0 0 0  Down, Depressed, Hopeless 0 0 0  PHQ - 2 Score 0 0 0  -GAD7:      No data to display        -Educated on Benefits of medication for symptom control Jan 2023: Patient very very adamant she is not depressed and before I could start the PHQ9 she said, "I'm not  depressed and I'm not down and I don't have depression" July 2023: Patient adamant she doesn't want to get a 25mg +50mg  and prefers to cut the 50mg  in half for 1.5  Chronic Pain (Back) -Pain in back since her 30's -Controlled -Current treatment: Hydrocodone/APAP 10/325mg  QID PRN Appropriate, Effective, Safe, Accessible -Medications previously tried: None  -Pain Scale Today's Vitals   05/17/22 1400  PainSc: 7      Aggravating Factors: Movement  Pain Type: Chronic aching Jan 2023: Without Meds: 1-10/10, depends on the day With Meds: 0/16 May 2022: Without Meds: 7/10, depends on the day With Meds: didn't answer -Recommended to continue current medication, patient very content. Pain happens occasionally   Chronic Pain (Neuropathy) -Controlled -Current treatment: Gabapentin 600mg  HS Appropriate, Effective, Safe, Accessible -Medications previously tried: None  -Pain Scale Aggravating Factors: Movement  Pain Type: Chronic aching Jan 2023: Without Meds: 1-10/10, depends on the day With Meds: 0/16 May 2022: Without Meds: 7/10, depends on the day With Meds: didn't answer -Recommended to continue current medication  OAB (Goal: Decrease Urination) -Controlled -Current treatment  Oxybutynin 5mg  TID (Taking QD) Query Appropriate, Query effective, Query Safe, Accessible -Medications previously tried: N/A  Jan 2023: Attempted to talk with patient about this disease state, frequency of urination etc. Patient changed topics. Brought up the idea that Oxybutynin IR TID could be changed for QD so it'll last all day and patient didn't wish to change therapy at this time July 2023: Unable to speak at length because patient didn't seem interested in speaking about any disease states   Patient Goals/Self-Care Activities Patient will:  - take medications as prescribed as evidenced by patient report and record review  Follow Up Plan: The patient has been provided with contact  information for the care management team and has been advised to call with any health related questions or concerns.   CPP F/U July 2023  [place] Constable, Sherian Rein.D. - [phone]       Medication Assistance: None required.  Patient affirms current coverage meets needs.  Compliance/Adherence/Medication fill history: Care Gaps: None   Star Rating Drugs: Atorvastatin 80 mg- Last filled 02-09-2022 30 DS Lisinopril 20 mg- Last filled 03-04-2022 90 DS  Patient's preferred pharmacy is:  [facility] [address] [place] [address] Phone: [phone] Fax: [phone]  Uses pill box? Yes Pt endorses 100% compliance  We discussed: Benefits of medication synchronization, packaging and delivery as well as enhanced pharmacist oversight with [employer]. Patient decided to: Continue current medication management strategy  Care Plan and Follow Up Patient Decision:  Patient agrees to Care Plan and Follow-up.  Plan: The patient has been provided with contact information for the care management team and has been advised to call with any health related questions or concerns.   CPP F/U Feb 2024  [place] Constable, [place].D. - [phone]

## 2022-05-17 NOTE — Patient Instructions (Signed)
Description   Hold today's dose, tomorrow's dose and Thursday's dose. On Friday take 2.'5mg'$  (1 tablet) then continue 2.'5mg'$  daily except 1.'25mg'$  on Mondays, Wednesdays, and Fridays.  Stay consistent with greens (2 times per week).  Repeat INR in 1 week.  Coumadin Clinic: [phone]

## 2022-05-17 NOTE — Telephone Encounter (Signed)
Prescription refill request received for warfarin [place]: 04/18/22  Next INR check: 05/24/22 Warfarin tablet strength: 2.5  Appropriate dose and refill sent to requested pharmacy.

## 2022-05-17 NOTE — Patient Instructions (Signed)
Visit Information   Goals Addressed   None    Patient Care Plan: CCM Pharmacy Care Plan     Problem Identified: COPD, Pain, AFib, Lipids   Priority: High  Onset Date: 11/16/2021     Long-Range Goal: Disease State Management   Start Date: 11/16/2021  Expected End Date: 11/16/2022  Recent Progress: On track  Priority: High  Note:   Current Barriers:  Does not contact provider office for questions/concerns  Pharmacist Clinical Goal(s):  Patient will contact provider office for questions/concerns as evidenced notation of same in electronic health record through collaboration with PharmD and provider.   Interventions: 1:1 collaboration with [name], MD regarding development and update of comprehensive plan of care as evidenced by provider attestation and co-signature Inter-disciplinary care team collaboration (see longitudinal plan of care) Comprehensive medication review performed; medication list updated in electronic medical record  Hypertension (BP goal <140/90) BP Readings from Last 3 Encounters:  04/18/22 (!) 152/80  03/15/22 110/60  12/09/21 (!) 166/90  -Controlled -Current treatment: Diltiazem '120mg'$  Appropriate, Effective, Safe, Accessible Lisinopril '20mg'$  Appropriate, Effective, Safe, Accessible -Medications previously tried: N/A  -Current home readings: Doesn't test -Current dietary habits: "Tries to eat healthy" -Current exercise habits: None (Patient has to use a walker) -Denies hypotensive/hypertensive symptoms -Educated on BP goals and benefits of medications for prevention of heart attack, stroke and kidney damage; -Counseled to monitor BP at home PRN, document, and provide log at future appointments -Recommended to continue current medication  Hyperlipidemia: (LDL goal < 100) The ASCVD Risk score (Arnett DK, et al., 2019) failed to calculate for the following reasons:   The 2019 ASCVD risk score is only valid for ages 52 to 39 Lab Results   Component Value Date   CHOL 135 03/15/2022   CHOL 142 12/09/2021   CHOL 165 05/19/2021   Lab Results  Component Value Date   HDL 48 03/15/2022   HDL 42 12/09/2021   HDL 56 05/19/2021   Lab Results  Component Value Date   LDLCALC 63 03/15/2022   LDLCALC 75 12/09/2021   LDLCALC 89 05/19/2021   Lab Results  Component Value Date   TRIG 135 03/15/2022   TRIG 141 12/09/2021   TRIG 109 05/19/2021   Lab Results  Component Value Date   CHOLHDL 2.8 03/15/2022   CHOLHDL 3.4 12/09/2021   CHOLHDL 2.9 05/19/2021  No results found for: "LDLDIRECT" -Controlled -Current treatment: Atorvastatin '80mg'$  Appropriate, Effective, Safe, Accessible -Medications previously tried: N/A  -Current dietary patterns: "Tries to eat healthy" -Current exercise habits: None -Educated on Benefits of statin for ASCVD risk reduction; -Recommended to continue current medication  Atrial Fibrillation (Goal: prevent stroke and major bleeding) -Controlled -CHADSVASC:   Jan 2023: 5 -Current treatment: Rate control:  Diltiazem '120mg'$  Appropriate, Effective, Safe, Accessible Lisinopril '20mg'$  QD Appropriate, Effective, Safe, Accessible Anticoagulation:  Warfarin UD Appropriate, Effective, Safe, Accessible -Medications previously tried: DOAC (Can't afford), Furosemide (Patient stopped due to ADR's) -Home BP and HR readings: Doesn't test  -Counseled on increased risk of stroke due to Afib and benefits of anticoagulation for stroke prevention; Jan 2023: Will let PCP know patient Dc'd Furosemide  COPD (Goal: control symptoms and prevent exacerbations) -Controlled -Current treatment  None -Medications previously tried: Tiotropium  -MMRC/CAT score:      No data to display        -Exacerbations requiring treatment in last 6 months: None -Patient denies consistent use of maintenance inhaler -Frequency of rescue inhaler use: None -Counseled on Benefits of consistent  maintenance inhaler use Jan 2023: Patient  is adamant she doesn't need an inhaler and didn't wish to speak further about COPD  Depression/Anxiety -Controlled -Current treatment: Sertraline '75mg'$  Appropriate, Effective, Safe, Accessible -Medications previously tried/failed: None -PHQ9:     03/02/2022    8:09 PM 11/16/2021   10:54 AM 05/19/2021   10:39 AM  Depression screen PHQ 2/9  Decreased Interest 0 0 0  Down, Depressed, Hopeless 0 0 0  PHQ - 2 Score 0 0 0  -GAD7:      No data to display        -Educated on Benefits of medication for symptom control Jan 2023: Patient very very adamant she is not depressed and before I could start the PHQ9 she said, "I'm not depressed and I'm not down and I don't have depression" July 2023: Patient adamant she doesn't want to get a '25mg'$ +'50mg'$  and prefers to cut the '50mg'$  in half for 1.5  Chronic Pain (Back) -Pain in back since her 30's -Controlled -Current treatment: Hydrocodone/APAP 10/'325mg'$  QID PRN Appropriate, Effective, Safe, Accessible -Medications previously tried: None  -Pain Scale Today's Vitals   05/17/22 1400  PainSc: 7      Aggravating Factors: Movement  Pain Type: Chronic aching Jan 2023: Without Meds: 1-10/10, depends on the day With Meds: 0/16 May 2022: Without Meds: 7/10, depends on the day With Meds: didn't answer -Recommended to continue current medication, patient very content. Pain happens occasionally   Chronic Pain (Neuropathy) -Controlled -Current treatment: Gabapentin '600mg'$  HS Appropriate, Effective, Safe, Accessible -Medications previously tried: None  -Pain Scale Aggravating Factors: Movement  Pain Type: Chronic aching Jan 2023: Without Meds: 1-10/10, depends on the day With Meds: 0/16 May 2022: Without Meds: 7/10, depends on the day With Meds: didn't answer -Recommended to continue current medication  OAB (Goal: Decrease Urination) -Controlled -Current treatment  Oxybutynin '5mg'$  TID (Taking QD) Query Appropriate, Query effective,  Query Safe, Accessible -Medications previously tried: N/A  Jan 2023: Attempted to talk with patient about this disease state, frequency of urination etc. Patient changed topics. Brought up the idea that Oxybutynin IR TID could be changed for QD so it'll last all day and patient didn't wish to change therapy at this time July 2023: Unable to speak at length because patient didn't seem interested in speaking about any disease states   Patient Goals/Self-Care Activities Patient will:  - take medications as prescribed as evidenced by patient report and record review  Follow Up Plan: The patient has been provided with contact information for the care management team and has been advised to call with any health related questions or concerns.   CPP F/U July 2023  [place] Constable, Sherian Rein.D. - [phone]      Ms. Haskew was given information about Chronic Care Management services today including:  CCM service includes personalized support from designated clinical staff supervised by her physician, including individualized plan of care and coordination with other care providers 24/7 contact phone numbers for assistance for urgent and routine care needs. Standard insurance, coinsurance, copays and deductibles apply for chronic care management only during months in which we provide at least 20 minutes of these services. Most insurances cover these services at 100%, however patients may be responsible for any copay, coinsurance and/or deductible if applicable. This service may help you avoid the need for more expensive face-to-face services. Only one practitioner may furnish and bill the service in a calendar month. The patient may stop CCM services at any time (effective at  the end of the month) by phone call to the office staff.  Patient agreed to services and verbal consent obtained.   The patient verbalized understanding of instructions, educational materials, and care plan provided today and  DECLINED offer to receive copy of patient instructions, educational materials, and care plan.  The pharmacy team will reach out to the patient again over the next 60 days.   Lane Hacker, [place]

## 2022-05-18 ENCOUNTER — Telehealth: Payer: Self-pay

## 2022-05-18 ENCOUNTER — Other Ambulatory Visit: Payer: Self-pay

## 2022-05-18 MED ORDER — SERTRALINE HCL 50 MG PO TABS: 75.0000 mg | ORAL_TABLET | Freq: Every day | ORAL | 2 refills | Status: DC

## 2022-05-18 NOTE — Progress Notes (Addendum)
Called pt back after receiving a vm with questions about an appt. I left her a message to call me back if she needs.  Pt called back and stated she had no clue what I was talking about. She stated she didn't have any questions and could not remember why she called.   Elray Mcgregor, [place] Pharmacist Assistant  [phone]

## 2022-05-24 ENCOUNTER — Ambulatory Visit (INDEPENDENT_AMBULATORY_CARE_PROVIDER_SITE_OTHER): Payer: Medicare Other

## 2022-05-24 DIAGNOSIS — R0989 Other specified symptoms and signs involving the circulatory and respiratory systems: Secondary | ICD-10-CM | POA: Diagnosis not present

## 2022-05-24 DIAGNOSIS — Z7901 Long term (current) use of anticoagulants: Secondary | ICD-10-CM | POA: Diagnosis not present

## 2022-05-24 DIAGNOSIS — I482 Chronic atrial fibrillation, unspecified: Secondary | ICD-10-CM

## 2022-05-24 DIAGNOSIS — I739 Peripheral vascular disease, unspecified: Secondary | ICD-10-CM | POA: Diagnosis not present

## 2022-05-24 DIAGNOSIS — B351 Tinea unguium: Secondary | ICD-10-CM | POA: Insufficient documentation

## 2022-05-24 DIAGNOSIS — R238 Other skin changes: Secondary | ICD-10-CM | POA: Diagnosis not present

## 2022-05-24 DIAGNOSIS — L89892 Pressure ulcer of other site, stage 2: Secondary | ICD-10-CM | POA: Diagnosis not present

## 2022-05-24 LAB — POCT INR: INR: 1.6 — AB (ref 2.0–3.0)

## 2022-05-24 NOTE — Patient Instructions (Signed)
Description   Take 2 tablets today and then continue 2.'5mg'$  daily except 1.'25mg'$  on Mondays, Wednesdays, and Fridays.  Stay consistent with greens (2 times per week).  Repeat INR in 1 week.  [facility]: [phone]

## 2022-05-30 NOTE — Progress Notes (Signed)
Msg from Dr. Tobie Poet about increasing Sertraline to '100mg'$ . Let Elissa Lovett know to send new script and I called and counseled patient on dose increase.

## 2022-05-31 ENCOUNTER — Ambulatory Visit (INDEPENDENT_AMBULATORY_CARE_PROVIDER_SITE_OTHER): Payer: Medicare Other

## 2022-05-31 ENCOUNTER — Encounter: Payer: Self-pay | Admitting: Podiatrist

## 2022-05-31 ENCOUNTER — Ambulatory Visit: Payer: Medicare Other | Admitting: Podiatrist

## 2022-05-31 DIAGNOSIS — L603 Nail dystrophy: Secondary | ICD-10-CM

## 2022-05-31 DIAGNOSIS — I482 Chronic atrial fibrillation, unspecified: Secondary | ICD-10-CM

## 2022-05-31 DIAGNOSIS — L84 Corns and callosities: Secondary | ICD-10-CM

## 2022-05-31 DIAGNOSIS — Z7901 Long term (current) use of anticoagulants: Secondary | ICD-10-CM | POA: Diagnosis not present

## 2022-05-31 DIAGNOSIS — I739 Peripheral vascular disease, unspecified: Secondary | ICD-10-CM

## 2022-05-31 LAB — POCT INR: INR: 2 (ref 2.0–3.0)

## 2022-05-31 NOTE — Patient Instructions (Signed)
Description   Take 1.5 tablets today and then continue 2.'5mg'$  daily except 1.'25mg'$  on Mondays, Wednesdays, and Fridays.  Stay consistent with greens (2 times per week).  Repeat INR in 2 weeks.  [facility] Clinic: [phone]

## 2022-05-31 NOTE — Progress Notes (Signed)
Chief Complaint  Patient presents with   Nail Problem    Discolored brittle nails that are falling off. Bilateral nails 1-5   Callouses    Bilateral calluses     HPI: Patient is 88 y.o. female who presents today for check of her feet.  She relates she has calluses on her toes and bottoms on both feet that are painful. She also has toenails that grow long and thick and she is unable to trim.  She was unable to get into our office due to various issues and saw Dr. Gean Quint last week who treated her.  She would like to be established at our office and follow up here in the future.   Patient Active Problem List   Diagnosis Date Noted   Urge incontinence 05/19/2021   Foot ulcer, left, limited to breakdown of skin ([place]) 12/08/2020   PVD (peripheral vascular disease) ([place]) 11/13/2019   Prediabetes 09/25/2018   Vitamin D deficiency 09/04/2018   A-fib ([place]) 09/04/2018   Osteoarthritis of left knee 06/07/2018   Long-term current use of opiate analgesic 11/22/2017   Chronic low back pain 11/22/2017   Long term (current) use of anticoagulants [Z79.01] 05/24/2017   Menopause present 03/27/2017   Osteopenia 03/27/2017   Chronic bronchitis with COPD (chronic obstructive pulmonary disease) ([place]) 03/24/2016   Anticoagulant long-term use 02/25/2011   Mitral insufficiency    Essential hypertension    LVH (left ventricular hypertrophy) due to hypertensive disease    Pulmonary hypertension (HCC)    Hyperlipidemia    Back pain, chronic    Depression     Current Outpatient Medications on File Prior to Visit  Medication Sig Dispense Refill   ascorbic acid (VITAMIN C) 500 MG tablet Take 600 mg by mouth daily.      atorvastatin (LIPITOR) 80 MG tablet Take 1 tablet (80 mg total) by mouth daily. 90 tablet 3   Biotin 1000 MCG tablet Take 1 tablet by mouth daily.      Calcium Carbonate-Vitamin D (CALCIUM + D PO) Take by mouth daily.     Cholecalciferol (VITAMIN D PO) Take by mouth daily.      cyanocobalamin (,VITAMIN B-12,) 1000 MCG/ML injection Inject 1 mL (1,000 mcg total) into the muscle every 30 (thirty) days. 10 mL 3   diltiazem (CARDIZEM CD) 120 MG 24 hr capsule Take 120 mg by mouth daily.     gabapentin (NEURONTIN) 300 MG capsule Take by mouth.     HYDROcodone-acetaminophen (NORCO) 10-325 MG per tablet Take 1 tablet by mouth every 6 (six) hours as needed for moderate pain.      lidocaine (LIDODERM) 5 % 1 patch daily.     lisinopril (ZESTRIL) 20 MG tablet Take 1 tablet (20 mg total) by mouth daily. 90 tablet 2   oxybutynin (DITROPAN) 5 MG tablet Take 1 tablet (5 mg total) by mouth 3 (three) times daily. 90 tablet 2   oxyCODONE-acetaminophen (PERCOCET) 10-325 MG tablet Take 1 tablet by mouth 2 (two) times daily as needed.     pantoprazole (PROTONIX) 40 MG tablet Take 40 mg by mouth daily.     potassium chloride SA (KLOR-CON) 20 MEQ tablet TAKE ONE TABLET BY MOUTH TWICE DAILY 180 tablet 3   predniSONE (DELTASONE) 10 MG tablet Take by mouth.     sertraline (ZOLOFT) 50 MG tablet Take 1.5 tablets (75 mg total) by mouth daily. 45 tablet 2   vitamin A 8000 UNIT capsule Take 8,000 Units by mouth daily.  warfarin (COUMADIN) 2.5 MG tablet Take 1/2 tablet to 1 tablet daily by mouth or as directed by Anticoagulation Clinic. 90 tablet 0   No current facility-administered medications on file prior to visit.    Allergies  Allergen Reactions   Sulfanilamide     unkn   Penicillins Rash    Review of Systems No fevers, chills, nausea, muscle aches, no difficulty breathing, no calf pain, no chest pain or shortness of breath.   Physical Exam  GENERAL APPEARANCE: Alert, conversant. Appropriately groomed. No acute distress.   VASCULAR: Pedal pulses palpable 2/4 DP Right  and 1/4 DP Left, non palpable PT bilateral.  Capillary refill time is immediate to all digits,  Proximal to distal cooling it warm to warm.  Capillary fill time is immediate. (She relates she had an angiogram and had  the right leg arteries opened up-  she relates the left leg arteries were not opened and now she has darkening of the skin of the anterior shin) multiple varicosities present bilateral.   NEUROLOGIC: sensation is intact to 5.07 monofilament at 5/5 sites bilateral.  Light touch is intact bilateral, vibratory sensation intact bilateral  MUSCULOSKELETAL: acceptable muscle strength, tone and stability bilateral. Contracture second and fourth toe right foot- underlapping fifth toe right is noted as well. .  Decreased range of motion of digits bilateral.   DERMATOLOGIC: skin is warm, supple, and dry.  Skin is friable and fragile.  Calluses present submetatarsal 5 and left heel.  Right fourth toe has a callus at the tip of the toe. No redness or open wound is present.  Mild swelling of the digit is seen.   Digital nails are yellow and brittle. They have been trimmed back and are non infected, non painful today.     Assessment     ICD-10-CM   1. Nail dystrophy  L60.3     2. Corn or callus  L84     3. PVD (peripheral vascular disease) (HCC)  I73.9        Plan  Examined patient. Her feet look good and I discussed that since she just had her routine care appointment last week, I don't recommend any further treatment at todays visit.  I did give her a toe cap for the right fourth toe to help with the pain.  She will be reappointed for follow up in 3 months to get her on a routine schedule.

## 2022-06-01 ENCOUNTER — Other Ambulatory Visit: Payer: Self-pay

## 2022-06-01 ENCOUNTER — Other Ambulatory Visit: Payer: Self-pay | Admitting: Cardiology

## 2022-06-01 MED ORDER — SERTRALINE HCL 100 MG PO TABS: 100.0000 mg | ORAL_TABLET | Freq: Every day | ORAL | 0 refills | Status: DC

## 2022-06-01 NOTE — Progress Notes (Signed)
Coordinated with team for new Sertraline script

## 2022-06-01 NOTE — Telephone Encounter (Signed)
"  Recommend increase zoloft to 100 mg once daily in am"   Per Lane Hacker, RPH:   Could you please send this in? I'll let the patient know.

## 2022-06-06 DIAGNOSIS — E1140 Type 2 diabetes mellitus with diabetic neuropathy, unspecified: Secondary | ICD-10-CM | POA: Diagnosis not present

## 2022-06-06 DIAGNOSIS — I10 Essential (primary) hypertension: Secondary | ICD-10-CM | POA: Diagnosis not present

## 2022-06-09 DIAGNOSIS — R0989 Other specified symptoms and signs involving the circulatory and respiratory systems: Secondary | ICD-10-CM | POA: Diagnosis not present

## 2022-06-09 DIAGNOSIS — M2041 Other hammer toe(s) (acquired), right foot: Secondary | ICD-10-CM | POA: Insufficient documentation

## 2022-06-09 DIAGNOSIS — I739 Peripheral vascular disease, unspecified: Secondary | ICD-10-CM | POA: Diagnosis not present

## 2022-06-14 ENCOUNTER — Ambulatory Visit (INDEPENDENT_AMBULATORY_CARE_PROVIDER_SITE_OTHER): Payer: Medicare Other

## 2022-06-14 DIAGNOSIS — Z7901 Long term (current) use of anticoagulants: Secondary | ICD-10-CM | POA: Diagnosis not present

## 2022-06-14 DIAGNOSIS — I482 Chronic atrial fibrillation, unspecified: Secondary | ICD-10-CM | POA: Diagnosis not present

## 2022-06-14 LAB — POCT INR: INR: 1.6 — AB (ref 2.0–3.0)

## 2022-06-14 NOTE — Patient Instructions (Signed)
Description   Take 1.5 tablets today and then START taking  2.'5mg'$  daily except 1.'25mg'$  on Mondays and Fridays.  Stay consistent with greens (3 times per week).  Repeat INR in 2 weeks.  [facility]oumadin Clinic: [phone]

## 2022-06-15 DIAGNOSIS — H35722 Serous detachment of retinal pigment epithelium, left eye: Secondary | ICD-10-CM | POA: Diagnosis not present

## 2022-06-15 DIAGNOSIS — H52223 Regular astigmatism, bilateral: Secondary | ICD-10-CM | POA: Diagnosis not present

## 2022-06-15 DIAGNOSIS — H5212 Myopia, left eye: Secondary | ICD-10-CM | POA: Diagnosis not present

## 2022-06-15 DIAGNOSIS — H524 Presbyopia: Secondary | ICD-10-CM | POA: Diagnosis not present

## 2022-06-19 ENCOUNTER — Other Ambulatory Visit: Payer: Self-pay | Admitting: Cardiology

## 2022-06-21 ENCOUNTER — Ambulatory Visit (INDEPENDENT_AMBULATORY_CARE_PROVIDER_SITE_OTHER): Payer: Medicare Other

## 2022-06-21 ENCOUNTER — Other Ambulatory Visit: Payer: Self-pay | Admitting: Legal Medicine

## 2022-06-21 ENCOUNTER — Telehealth: Payer: Self-pay

## 2022-06-21 DIAGNOSIS — R350 Frequency of micturition: Secondary | ICD-10-CM | POA: Diagnosis not present

## 2022-06-21 DIAGNOSIS — R8290 Unspecified abnormal findings in urine: Secondary | ICD-10-CM | POA: Diagnosis not present

## 2022-06-21 DIAGNOSIS — N3000 Acute cystitis without hematuria: Secondary | ICD-10-CM

## 2022-06-21 LAB — POCT URINALYSIS DIP (CLINITEK)
Bilirubin, UA: NEGATIVE
Glucose, UA: NEGATIVE mg/dL
Ketones, POC UA: NEGATIVE mg/dL
Nitrite, UA: NEGATIVE
Spec Grav, UA: 1.015 (ref 1.010–1.025)
Urobilinogen, UA: 0.2 E.U./dL
pH, UA: 5 (ref 5.0–8.0)

## 2022-06-21 MED ORDER — NITROFURANTOIN MONOHYD MACRO 100 MG PO CAPS: 100.0000 mg | ORAL_CAPSULE | Freq: Two times a day (BID) | ORAL | 0 refills | Status: DC

## 2022-06-21 NOTE — Telephone Encounter (Signed)
[name] called with concerns that she may have a UTI.  She reports her urine is cloudy and she is experiencing urinary frequency.  She denies burning with urination.  She would like for something to be call in if possible and then check a urine when she comes in Aug. 22 to make sure that everything has cleared.  Please call [phone].

## 2022-06-21 NOTE — Progress Notes (Signed)
Macrobid called in for 10 days, urine cutured lp

## 2022-06-21 NOTE — Progress Notes (Signed)
Patient came into office for UA due to cloudiness in urine and frequency. UA positive and sent for culture. Note sent to provider.   [name] 06/21/22 2:33 PM

## 2022-06-22 ENCOUNTER — Other Ambulatory Visit: Payer: Self-pay | Admitting: Legal Medicine

## 2022-06-23 LAB — URINE CULTURE

## 2022-06-23 NOTE — Progress Notes (Signed)
Urine culture lactobacillis < 100,00 colonies, no deficinte UTI lp

## 2022-06-28 ENCOUNTER — Ambulatory Visit (INDEPENDENT_AMBULATORY_CARE_PROVIDER_SITE_OTHER): Payer: Medicare Other | Admitting: Legal Medicine

## 2022-06-28 ENCOUNTER — Ambulatory Visit (INDEPENDENT_AMBULATORY_CARE_PROVIDER_SITE_OTHER): Payer: Medicare Other

## 2022-06-28 ENCOUNTER — Encounter: Payer: Self-pay | Admitting: Legal Medicine

## 2022-06-28 VITALS — BP 128/82 | HR 110 | Temp 98.6°F | Resp 15 | Ht 64.0 in | Wt 143.0 lb

## 2022-06-28 DIAGNOSIS — R7303 Prediabetes: Secondary | ICD-10-CM

## 2022-06-28 DIAGNOSIS — E559 Vitamin D deficiency, unspecified: Secondary | ICD-10-CM | POA: Diagnosis not present

## 2022-06-28 DIAGNOSIS — E538 Deficiency of other specified B group vitamins: Secondary | ICD-10-CM | POA: Diagnosis not present

## 2022-06-28 DIAGNOSIS — D68311 Acquired hemophilia: Secondary | ICD-10-CM | POA: Insufficient documentation

## 2022-06-28 DIAGNOSIS — Z7901 Long term (current) use of anticoagulants: Secondary | ICD-10-CM | POA: Diagnosis not present

## 2022-06-28 DIAGNOSIS — I482 Chronic atrial fibrillation, unspecified: Secondary | ICD-10-CM

## 2022-06-28 DIAGNOSIS — M8588 Other specified disorders of bone density and structure, other site: Secondary | ICD-10-CM | POA: Diagnosis not present

## 2022-06-28 DIAGNOSIS — I10 Essential (primary) hypertension: Secondary | ICD-10-CM

## 2022-06-28 DIAGNOSIS — N3001 Acute cystitis with hematuria: Secondary | ICD-10-CM

## 2022-06-28 DIAGNOSIS — E782 Mixed hyperlipidemia: Secondary | ICD-10-CM

## 2022-06-28 DIAGNOSIS — I272 Pulmonary hypertension, unspecified: Secondary | ICD-10-CM

## 2022-06-28 DIAGNOSIS — Z79891 Long term (current) use of opiate analgesic: Secondary | ICD-10-CM | POA: Diagnosis not present

## 2022-06-28 DIAGNOSIS — I340 Nonrheumatic mitral (valve) insufficiency: Secondary | ICD-10-CM | POA: Diagnosis not present

## 2022-06-28 DIAGNOSIS — N3941 Urge incontinence: Secondary | ICD-10-CM | POA: Diagnosis not present

## 2022-06-28 LAB — POCT URINALYSIS DIP (CLINITEK)
Bilirubin, UA: NEGATIVE
Glucose, UA: NEGATIVE mg/dL
Ketones, POC UA: NEGATIVE mg/dL
Nitrite, UA: NEGATIVE
Spec Grav, UA: 1.02 (ref 1.010–1.025)
Urobilinogen, UA: 0.2 E.U./dL
pH, UA: 5.5 (ref 5.0–8.0)

## 2022-06-28 LAB — POCT INR: INR: 1.6 — AB (ref 2.0–3.0)

## 2022-06-28 MED ORDER — CEPHALEXIN 500 MG PO CAPS: 500.0000 mg | ORAL_CAPSULE | Freq: Two times a day (BID) | ORAL | 0 refills | Status: DC

## 2022-06-28 MED ORDER — CYANOCOBALAMIN 1000 MCG/ML IJ SOLN
1000.0000 ug | Freq: Once | INTRAMUSCULAR | Status: AC
  Administered 2022-06-28: 1000 ug via INTRAMUSCULAR

## 2022-06-28 NOTE — Patient Instructions (Signed)
Description   Take 1.5 tablets today and then START taking  2.'5mg'$  daily except 1.'25mg'$  on Mondays.  Stay consistent with greens (3 times per week).  Repeat INR in 3 weeks.  [facility]in Clinic: [phone]

## 2022-06-28 NOTE — Progress Notes (Signed)
Subjective:  Patient ID: [name], female    DOB: 17-Aug-1932  Age: 88 y.o. MRN: [id number]  Chief Complaint  Patient presents with   Hyperlipidemia   Hypertension   Prediabetes   Abdominal Pain    HPI: chronic Patient presents with hyperlipidemia.  Compliance with treatment has been good; patient takes medicines as directed, maintains low cholesterol diet, follows up as directed, and maintains exercise regimen.  Patient is using Atorvastatin 80 mg daily without problems.    Patient presents for follow up of hypertension.  Patient tolerating Lisinopril 20 mg daily, Diltiazem 120 mg daily well without side effects.  Patient is working on maintaining diet and exercise regimen and follows up as directed.   Depression: Taking Sertraline 100 mg daily.  Urge incontinence: She is taking Oxybutynin 5 mg TID.  Prediabetes, does not check glucose, called in one touch  Chronc low back pain- on hydrocodone from Dr. Nelva Bush  Current Outpatient Medications on File Prior to Visit  Medication Sig Dispense Refill   ascorbic acid (VITAMIN C) 500 MG tablet Take 600 mg by mouth daily.      atorvastatin (LIPITOR) 80 MG tablet Take 1 tablet (80 mg total) by mouth daily. 90 tablet 3   Biotin 1000 MCG tablet Take 1 tablet by mouth daily.      Calcium Carbonate-Vitamin D (CALCIUM + D PO) Take by mouth daily.     Cholecalciferol (VITAMIN D PO) Take by mouth daily.     cyanocobalamin (,VITAMIN B-12,) 1000 MCG/ML injection Inject 1 mL (1,000 mcg total) into the muscle every 30 (thirty) days. 10 mL 3   diltiazem (CARDIZEM CD) 120 MG 24 hr capsule Take 120 mg by mouth daily.     gabapentin (NEURONTIN) 300 MG capsule Take by mouth.     HYDROcodone-acetaminophen (NORCO) 10-325 MG per tablet Take 1 tablet by mouth every 6 (six) hours as needed for moderate pain.      lidocaine (LIDODERM) 5 % 1 patch daily.     lisinopril (ZESTRIL) 20 MG tablet Take 1 tablet (20 mg total) by mouth daily. 90 tablet 2    oxybutynin (DITROPAN) 5 MG tablet Take 1 tablet (5 mg total) by mouth 3 (three) times daily. 90 tablet 2   oxyCODONE-acetaminophen (PERCOCET) 10-325 MG tablet Take 1 tablet by mouth 2 (two) times daily as needed.     pantoprazole (PROTONIX) 40 MG tablet Take 40 mg by mouth daily.     potassium chloride SA (KLOR-CON M) 20 MEQ tablet TAKE ONE TABLET BY MOUTH TWICE DAILY 180 tablet 3   potassium chloride SA (KLOR-CON M) 20 MEQ tablet Take by mouth.     sertraline (ZOLOFT) 100 MG tablet Take 1 tablet (100 mg total) by mouth daily. 90 tablet 0   vitamin A 8000 UNIT capsule Take 8,000 Units by mouth daily.     warfarin (COUMADIN) 2.5 MG tablet Take 1/2 tablet to 1 tablet daily by mouth or as directed by Anticoagulation Clinic. 90 tablet 0   Zinc Picolinate POWD Take by mouth.     No current facility-administered medications on file prior to visit.   Past Medical History:  Diagnosis Date   Arthritis    Back pain, chronic    Coronary artery disease CARDIOLOGIST- DR [name]--  LOV IN EPIC   Depression    Hammer toe    Hypercholesterolemia    Hypertensive heart disease    WITH LVH   LVH (left ventricular hypertrophy) due to hypertensive disease  Mitral insufficiency    CHRONIC   Pulmonary hypertension ([place])    Transfusion history    Past Surgical History:  Procedure Laterality Date   ABDOMINAL AORTOGRAM W/LOWER EXTREMITY N/A 04/29/2020   Procedure: ABDOMINAL AORTOGRAM W/LOWER EXTREMITY;  Surgeon: [name], MD;  Location: [facility];  Service: Cardiovascular;  Laterality: N/A;  Lt Leg   ABDOMINAL HYSTERECTOMY     ANTERIOR REPAIR/ [place] PUBOVAGINAL SLING  05-03-2002   BUNIONECTOMY WITH HAMMERTOE RECONSTRUCTION Right 03/01/2013   Procedure: RIGHT FOOT EXCISION BUNIONETTE AND FUSION OF DIP FOURTH TOE;  Surgeon: [name], MD;  Location: [facility];  Service: Orthopedics;  Laterality: Right;   CARDIAC CATHETERIZATION  02-03-2009  DR  [name]    SINGLE-VESSEL OBSTRUCTIVE CAD, SMALL DIAGONAL BRANCH/ NORMAL LVF/ SEVERE MITRAL INSUFFICIENCY/ SEVERE PULMONARY HYPERTENSION   CATARACT EXTRACTION W/ INTRAOCULAR LENS  IMPLANT, BILATERAL  2003   COLONOSCOPY  07/25/2014   few mall scattered diverticula in the ectire examined colon. Small internal hemmorhoids.    HAMMER TOE SURGERY  02/28/2012   Procedure: HAMMER TOE CORRECTION;  Surgeon: [name], MD;  Location: [facility];  Service: Orthopedics;  Laterality: Right;  FUSION OF PROXIMAL INTERPHALANGEAL  RIGHT THIRD CLAW TOE   PARS PLANA VITRECTOMY W/ REPAIR OF MACULAR HOLE  02-04-2002   LEFT EYE   PERIPHERAL VASCULAR BALLOON ANGIOPLASTY  04/29/2020   Procedure: PERIPHERAL VASCULAR BALLOON ANGIOPLASTY;  Surgeon: [name], MD;  Location: [facility];  Service: Cardiovascular;;  Lt. AT   REPAIR RIGHT SECOND CLAW TOE/ VARUS MEDIAL CLOSING WEDGE OSTEOTOMY PROXIMAL PHALANX RIGHT GREAT TOE  05-05-2005   TOTAL HIP ARTHROPLASTY     BILATERAL---  LEFT 4/98;   RIGHT  11/98   VARUS OSTEOTOMY PROXIMAL PHALANX, LEFT GREAT TOE/ PARING DOWN THE CALLUS , SECOND LEFT TOE  10-27-2008    Family History  Problem Relation Age of Onset   Heart disease Mother 69   Prostate cancer Father 52   Social History   Socioeconomic History   Marital status: Divorced    Spouse name: Not on file   Number of children: 3   Years of education: College   Highest education level: Not on file  Occupational History   Occupation: Retired    Fish farm manager: RETIRED  Tobacco Use   Smoking status: Never   Smokeless tobacco: Never  Vaping Use   Vaping Use: Never used  Substance and Sexual Activity   Alcohol use: Not Currently    Alcohol/week: 1.0 standard drink of alcohol    Types: 1 Glasses of wine per week    Comment: occasional   Drug use: Yes    Types: Hydrocodone   Sexual activity: Not Currently  Other Topics Concern   Not on file  Social History Narrative   Patient lives at home  alone.   Caffeine Use: 1 cup daily occasionally   Social Determinants of Health   Financial Resource Strain: Low Risk  (05/17/2022)   Overall Financial Resource Strain (CARDIA)    Difficulty of Paying Living Expenses: Not hard at all  Food Insecurity: No Food Insecurity (03/02/2022)   Hunger Vital Sign    Worried About Running Out of Food in the Last Year: Never true    Ran Out of Food in the Last Year: Never true  Transportation Needs: Unmet Transportation Needs (03/02/2022)   PRAPARE - Hydrologist (Medical): Yes    Lack of Transportation (Non-Medical):  No  Physical Activity: Inactive (05/17/2022)   Exercise Vital Sign    Days of Exercise per Week: 0 days    Minutes of Exercise per Session: 0 min  Stress: Stress Concern Present (03/02/2022)   [place]    Feeling of Stress : To some extent  Social Connections: Not on file    Review of Systems  Constitutional:  Negative for chills, fatigue and fever.  HENT:  Negative for congestion, ear pain and sore throat.   Eyes:  Negative for visual disturbance.  Respiratory:  Positive for shortness of breath. Negative for cough.   Cardiovascular:  Negative for chest pain and palpitations.  Gastrointestinal:  Negative for abdominal pain, constipation, diarrhea, nausea and vomiting.  Endocrine: Negative for polydipsia, polyphagia and polyuria.  Genitourinary:  Negative for difficulty urinating and dysuria.  Musculoskeletal:  Negative for arthralgias, back pain and myalgias.  Skin:  Negative for rash.  Neurological:  Negative for headaches.  Psychiatric/Behavioral:  Negative for dysphoric mood. The patient is not nervous/anxious.      Objective:  BP 128/82 (BP Location: Left Arm, Patient Position: Sitting)   Pulse (!) 110   Temp 98.6 F (37 C)   Resp 15   Ht '5\' 4"'$  (1.626 m)   Wt 143 lb (64.9 kg)   SpO2 97%   BMI 24.55 kg/m      06/28/2022    11:56 AM 06/28/2022   10:42 AM 04/18/2022    9:18 AM  BP/Weight  Systolic BP 109 323 557  Diastolic BP 82 70 80  Wt. (Lbs)  143 147  BMI  24.55 kg/m2 25.23 kg/m2    Physical Exam Vitals reviewed.  Constitutional:      General: She is not in acute distress.    Appearance: She is well-developed.  HENT:     Head: Normocephalic and atraumatic.     Right Ear: Tympanic membrane normal.     Left Ear: Tympanic membrane normal.     Nose: Nose normal.     Mouth/Throat:     Mouth: Mucous membranes are moist.     Pharynx: Oropharynx is clear.  Eyes:     Extraocular Movements: Extraocular movements intact.     Conjunctiva/sclera: Conjunctivae normal.     Pupils: Pupils are equal, round, and reactive to light.  Cardiovascular:     Rate and Rhythm: Normal rate. Rhythm irregular.     Pulses: Normal pulses.     Heart sounds: Murmur (mitral) heard.     No gallop.  Pulmonary:     Effort: Pulmonary effort is normal. No respiratory distress.     Breath sounds: Normal breath sounds. No wheezing.  Abdominal:     General: Abdomen is flat. Bowel sounds are normal. There is no distension.     Palpations: Abdomen is soft.     Tenderness: There is no abdominal tenderness.  Musculoskeletal:        General: Normal range of motion.     Cervical back: Normal range of motion and neck supple.     Right lower leg: No edema.     Left lower leg: No edema.     Comments: Strength 2/5 both legs  Skin:    General: Skin is warm.     Capillary Refill: Capillary refill takes less than 2 seconds.  Neurological:     General: No focal deficit present.     Mental Status: She is alert and oriented to person, place, and  time. Mental status is at baseline.     Gait: Gait abnormal.     Comments: Uses rollator     Diabetic Foot Exam - Simple   Simple Foot Form Diabetic Foot exam was performed with the following findings: Yes 06/28/2022 11:24 AM  Visual Inspection No deformities, no ulcerations, no other skin  breakdown bilaterally: Yes Sensation Testing See comments: Yes Pulse Check Posterior Tibialis and Dorsalis pulse intact bilaterally: Yes Comments Numbness both feet, from L4-L5 back      Lab Results  Component Value Date   WBC 6.4 03/15/2022   HGB 12.7 03/15/2022   HCT 39.6 03/15/2022   PLT 234 03/15/2022   GLUCOSE 101 (H) 03/15/2022   CHOL 135 03/15/2022   TRIG 135 03/15/2022   HDL 48 03/15/2022   LDLCALC 63 03/15/2022   ALT 20 03/15/2022   AST 33 03/15/2022   NA 142 03/15/2022   K 4.5 03/15/2022   CL 104 03/15/2022   CREATININE 0.83 03/15/2022   BUN 10 03/15/2022   CO2 21 03/15/2022   TSH 2.560 12/09/2021   INR 1.6 (A) 06/28/2022   HGBA1C 5.9 (H) 03/15/2022   MICROALBUR 30 05/13/2020      Assessment & Plan:   Problem List Items Addressed This Visit       Cardiovascular and Mediastinum   Mitral insufficiency Patient has moderate mitral insufficiency    Essential hypertension   Relevant Orders   Comprehensive metabolic panel   CBC with Differential/Platelet An individual hypertension care plan was established and reinforced today.  The patient's status was assessed using clinical findings on exam and labs or diagnostic tests. The patient's success at meeting treatment goals on disease specific evidence-based guidelines and found to be well controlled. SELF MANAGEMENT: The patient and I together assessed ways to personally work towards obtaining the recommended goals. RECOMMENDATIONS: avoid decongestants found in common cold remedies, decrease consumption of alcohol, perform routine monitoring of BP with home BP cuff, exercise, reduction of dietary salt, take medicines as prescribed, try not to miss doses and quit smoking.  Regular exercise and maintaining a healthy weight is needed.  Stress reduction may help. A CLINICAL SUMMARY including written plan identify barriers to care unique to individual due to social or financial issues.  We attempt to mutually creat  solutions for individual and family understanding.     Pulmonary hypertension ([place]) Patient has some [place] on catheterization     Musculoskeletal and Integument   Osteopenia Patient is on calcium and vitamin D     Hematopoietic and Hemostatic   Acquired thrombophilia ([place]) Patient on chronic coumadin for atrial fibrillation     Other   Hyperlipidemia   Relevant Orders   Lipid panel AN INDIVIDUAL CARE PLAN for hyperlipidemia/ cholesterol was established and reinforced today.  The patient's status was assessed using clinical findings on exam, lab and other diagnostic tests. The patient's disease status was assessed based on evidence-based guidelines and found to be fair controlled. MEDICATIONS were reviewed. SELF MANAGEMENT GOALS have been discussed and patient's success at attaining the goal of low cholesterol was assessed. RECOMMENDATION given include regular exercise 3 days a week and low cholesterol/low fat diet. CLINICAL SUMMARY including written plan to identify barriers unique to the patient due to social or economic  reasons was discussed.     Vitamin D deficiency   Relevant Orders   VITAMIN D 25 Hydroxy (Vit-D Deficiency, Fractures) Patient has vitamin D deficiency check level   Prediabetes   Relevant Orders  Hemoglobin A1c   POCT Glucose (Device for Home Use) Patient not testing glucose, start daily glucose testing    Long-term current use of opiate analgesic Patient on oxycodone AN INDIVIDUAL CARE PLAN was established and reinforced today.  The patient's status was assessed using clinical findings on exam, labs, and other diagnostic testing. Patient's success at meeting treatment goals based on disease specific evidence-bassed guidelines and found to be in fair control. RECOMMENDATIONS include maintining present medicines and treatment. He is on chronic oxycodone with no abuse.  Negative REMS.     Urge incontinence - Primary   Relevant Medications   cephALEXin (KEFLEX)  500 MG capsule   Other Relevant Orders   POCT URINALYSIS DIP (CLINITEK) (Completed) Patient has 3+ leukocytosis in urine, culture  Atrial fibrillation Patient has chronic atrial fibrillation on coumadin   Other Visit Diagnoses     Anticoagulant long-term use       Relevant Orders   Protime-INR Patient is on coumadin   B12 deficiency       Relevant Medications   cyanocobalamin (VITAMIN B12) injection 1,000 mcg (Completed)patient has chronic B12 deficiency, check level    Acute cystitis with hematuria       Relevant Orders   Urine Culture Start patient on keflex     .  Meds ordered this encounter  Medications   cyanocobalamin (VITAMIN B12) injection 1,000 mcg   cephALEXin (KEFLEX) 500 MG capsule    Sig: Take 1 capsule (500 mg total) by mouth 2 (two) times daily.    Dispense:  20 capsule    Refill:  0    Orders Placed This Encounter  Procedures   Urine Culture   Comprehensive metabolic panel   Hemoglobin A1c   Lipid panel   CBC with Differential/Platelet   VITAMIN D 25 Hydroxy (Vit-D Deficiency, Fractures)   Protime-INR   POCT Glucose (Device for Home Use)   POCT URINALYSIS DIP (CLINITEK)     Follow-up: Return in about 3 months (around 09/28/2022).  An After Visit Summary was printed and given to the patient.  Reinaldo Meeker, MD [facility] [phone]

## 2022-06-29 LAB — CBC WITH DIFFERENTIAL/PLATELET
Basophils Absolute: 0 10*3/uL (ref 0.0–0.2)
Basos: 1 %
EOS (ABSOLUTE): 0.1 10*3/uL (ref 0.0–0.4)
Eos: 1 %
Hematocrit: 38.3 % (ref 34.0–46.6)
Hemoglobin: 12.3 g/dL (ref 11.1–15.9)
Immature Grans (Abs): 0 10*3/uL (ref 0.0–0.1)
Immature Granulocytes: 0 %
Lymphocytes Absolute: 0.9 10*3/uL (ref 0.7–3.1)
Lymphs: 12 %
MCH: 29.1 pg (ref 26.6–33.0)
MCHC: 32.1 g/dL (ref 31.5–35.7)
MCV: 91 fL (ref 79–97)
Monocytes Absolute: 0.5 10*3/uL (ref 0.1–0.9)
Monocytes: 7 %
Neutrophils Absolute: 5.9 10*3/uL (ref 1.4–7.0)
Neutrophils: 79 %
Platelets: 280 10*3/uL (ref 150–450)
RBC: 4.22 x10E6/uL (ref 3.77–5.28)
RDW: 14.3 % (ref 11.7–15.4)
WBC: 7.5 10*3/uL (ref 3.4–10.8)

## 2022-06-29 LAB — COMPREHENSIVE METABOLIC PANEL
ALT: 16 IU/L (ref 0–32)
AST: 28 IU/L (ref 0–40)
Albumin/Globulin Ratio: 1.3 (ref 1.2–2.2)
Albumin: 4.1 g/dL (ref 3.7–4.7)
Alkaline Phosphatase: 106 IU/L (ref 44–121)
BUN/Creatinine Ratio: 22 (ref 12–28)
BUN: 18 mg/dL (ref 8–27)
Bilirubin Total: 0.4 mg/dL (ref 0.0–1.2)
CO2: 22 mmol/L (ref 20–29)
Calcium: 10 mg/dL (ref 8.7–10.3)
Chloride: 105 mmol/L (ref 96–106)
Creatinine, Ser: 0.82 mg/dL (ref 0.57–1.00)
Globulin, Total: 3.2 g/dL (ref 1.5–4.5)
Glucose: 109 mg/dL — ABNORMAL HIGH (ref 70–99)
Potassium: 4.8 mmol/L (ref 3.5–5.2)
Sodium: 144 mmol/L (ref 134–144)
Total Protein: 7.3 g/dL (ref 6.0–8.5)
eGFR: 69 mL/min/{1.73_m2} (ref >59)

## 2022-06-29 LAB — CARDIOVASCULAR RISK ASSESSMENT

## 2022-06-29 LAB — HEMOGLOBIN A1C
Est. average glucose Bld gHb Est-mCnc: 131 mg/dL
Hgb A1c MFr Bld: 6.2 % — ABNORMAL HIGH (ref 4.8–5.6)

## 2022-06-29 LAB — LIPID PANEL
Chol/HDL Ratio: 2.8 ratio (ref 0.0–4.4)
Cholesterol, Total: 131 mg/dL (ref 100–199)
HDL: 46 mg/dL (ref >39)
LDL Chol Calc (NIH): 63 mg/dL (ref 0–99)
Triglycerides: 126 mg/dL (ref 0–149)
VLDL Cholesterol Cal: 22 mg/dL (ref 5–40)

## 2022-06-29 LAB — PROTIME-INR
INR: 1.6 — ABNORMAL HIGH (ref 0.9–1.2)
Prothrombin Time: 16.5 s — ABNORMAL HIGH (ref 9.1–12.0)

## 2022-06-29 LAB — VITAMIN D 25 HYDROXY (VIT D DEFICIENCY, FRACTURES): Vit D, 25-Hydroxy: 117 ng/mL — ABNORMAL HIGH (ref 30.0–100.0)

## 2022-06-29 NOTE — Progress Notes (Signed)
INR 1.6 , like to be 2 to 3, glucose 109, Kidney tests normal, liver tests normal, A1c 6.2 , Vitamin D 117 high, cholesterol normal, CBC normal

## 2022-06-30 LAB — URINE CULTURE

## 2022-06-30 NOTE — Progress Notes (Signed)
Urine culture normal, no infection lp

## 2022-07-10 ENCOUNTER — Other Ambulatory Visit: Payer: Self-pay | Admitting: Legal Medicine

## 2022-07-10 DIAGNOSIS — N3941 Urge incontinence: Secondary | ICD-10-CM

## 2022-07-19 ENCOUNTER — Ambulatory Visit: Payer: Medicare Other | Attending: Cardiology

## 2022-07-19 DIAGNOSIS — Z7901 Long term (current) use of anticoagulants: Secondary | ICD-10-CM | POA: Diagnosis not present

## 2022-07-19 DIAGNOSIS — I482 Chronic atrial fibrillation, unspecified: Secondary | ICD-10-CM

## 2022-07-19 LAB — POCT INR: INR: 2.5 (ref 2.0–3.0)

## 2022-07-19 NOTE — Patient Instructions (Signed)
Description   Continue taking  2.'5mg'$  daily except 1.'25mg'$  on Mondays.  Stay consistent with greens (3 times per week).  Repeat INR in 5 weeks.  Coumadin Clinic: [phone]

## 2022-07-22 ENCOUNTER — Telehealth: Payer: Self-pay

## 2022-07-22 NOTE — Chronic Care Management (AMB) (Signed)
    Chronic Care Management Pharmacy Assistant   Name: [name]  MRN: [id number] DOB: 1932-02-19  Reason for Encounter: Disease State/ General  Recent office visits:  06-28-2022 [name], MD. Glucose= 109. A1C= 6.2. Abnormal UA. INR= 1.6, Prothrombin Time= 16.5. Vitamin D= 117.0. START keflex 500 mg twice daily. COMPLETED macrobid, prednisone and hydrocodone. B12 injection given.  06-21-2022 Erie Noe, LPN. Abnormal UA.  Recent consult visits:  07-19-2022 [name], RN (Cardiology) Anti-coag visit.  06-28-2022 [name], RN (Cardiology) Anti-coag visit.  06-14-2022  [name], RN (Cardiology) Anti-coag visit.  06-09-2022 Tilles, Osvaldo Shipper, DPM (Podiatry). Follow up.  05-31-2022 Bronson Ing, DPM (Podiatry). Follow up.  05-31-2022 [name], RN (Cardiology) Anti-coag visit.  05-24-2022 Estella Husk, DPM (Podiatry). Initial consult for Tinea unguium.  Hospital visits:  None in previous 6 months  Medications: Outpatient Encounter Medications as of 07/22/2022  Medication Sig   ascorbic acid (VITAMIN C) 500 MG tablet Take 600 mg by mouth daily.    atorvastatin (LIPITOR) 80 MG tablet Take 1 tablet (80 mg total) by mouth daily.   Biotin 1000 MCG tablet Take 1 tablet by mouth daily.    Calcium Carbonate-Vitamin D (CALCIUM + D PO) Take by mouth daily.   cephALEXin (KEFLEX) 500 MG capsule Take 1 capsule (500 mg total) by mouth 2 (two) times daily.   Cholecalciferol (VITAMIN D PO) Take by mouth daily.   cyanocobalamin (,VITAMIN B-12,) 1000 MCG/ML injection Inject 1 mL (1,000 mcg total) into the muscle every 30 (thirty) days.   diltiazem (CARDIZEM CD) 120 MG 24 hr capsule Take 120 mg by mouth daily.   gabapentin (NEURONTIN) 300 MG capsule Take by mouth.   lidocaine (LIDODERM) 5 % 1 patch daily.   lisinopril (ZESTRIL) 20 MG tablet Take 1 tablet (20 mg total) by mouth daily.   oxybutynin (DITROPAN) 5 MG  tablet Take 1 tablet (5 mg total) by mouth 3 (three) times daily.   oxyCODONE-acetaminophen (PERCOCET) 10-325 MG tablet Take 1 tablet by mouth 2 (two) times daily as needed.   pantoprazole (PROTONIX) 40 MG tablet Take 40 mg by mouth daily.   potassium chloride SA (KLOR-CON M) 20 MEQ tablet TAKE ONE TABLET BY MOUTH TWICE DAILY   potassium chloride SA (KLOR-CON M) 20 MEQ tablet Take by mouth.   sertraline (ZOLOFT) 100 MG tablet Take 1 tablet (100 mg total) by mouth daily.   vitamin A 8000 UNIT capsule Take 8,000 Units by mouth daily.   warfarin (COUMADIN) 2.5 MG tablet Take 1/2 tablet to 1 tablet daily by mouth or as directed by Anticoagulation Clinic.   Zinc Picolinate POWD Take by mouth.   No facility-administered encounter medications on file as of 07/22/2022.   07-22-2022: 1st attempt left VM 07-26-2022: 2nd attempt unable to LVM 07-27-2022: 3rd attempt unable to LVM  Care Gaps: Yearly ophthalmology exam overdue Covid booster overdue Yearly mammogram overdue Flu vaccine overdue  Star Rating Drugs: Lisinopril 20 mg- Last filled 07-07-2022 90 DS. Previous filled 06-07-2022 30 DS Atorvastatin 80 mg- Last filled 06-09-2022 90 DS. Previous filled 05-17-2022 30 DS  [place] Clinical Pharmacist Assistant [phone]

## 2022-08-16 ENCOUNTER — Other Ambulatory Visit: Payer: Self-pay | Admitting: Family Medicine

## 2022-08-16 DIAGNOSIS — F324 Major depressive disorder, single episode, in partial remission: Secondary | ICD-10-CM

## 2022-08-16 DIAGNOSIS — I10 Essential (primary) hypertension: Secondary | ICD-10-CM

## 2022-08-23 ENCOUNTER — Other Ambulatory Visit: Payer: Self-pay | Admitting: Cardiology

## 2022-08-23 ENCOUNTER — Ambulatory Visit: Payer: Medicare Other | Attending: Cardiology

## 2022-08-23 DIAGNOSIS — I482 Chronic atrial fibrillation, unspecified: Secondary | ICD-10-CM

## 2022-08-23 DIAGNOSIS — Z7901 Long term (current) use of anticoagulants: Secondary | ICD-10-CM | POA: Diagnosis not present

## 2022-08-23 DIAGNOSIS — I4811 Longstanding persistent atrial fibrillation: Secondary | ICD-10-CM

## 2022-08-23 LAB — POCT INR: INR: 2.1 (ref 2.0–3.0)

## 2022-08-23 NOTE — Patient Instructions (Signed)
Description   Continue taking  2.'5mg'$  daily except 1.'25mg'$  on Mondays.  Stay consistent with greens (2-3 times per week).  Repeat INR in 6 weeks (per pt request).  Coumadin Clinic: [phone]

## 2022-09-15 ENCOUNTER — Ambulatory Visit: Payer: Medicare Other | Admitting: Podiatry

## 2022-09-15 DIAGNOSIS — I739 Peripheral vascular disease, unspecified: Secondary | ICD-10-CM | POA: Diagnosis not present

## 2022-09-15 DIAGNOSIS — R0989 Other specified symptoms and signs involving the circulatory and respiratory systems: Secondary | ICD-10-CM | POA: Diagnosis not present

## 2022-09-15 DIAGNOSIS — M2041 Other hammer toe(s) (acquired), right foot: Secondary | ICD-10-CM | POA: Diagnosis not present

## 2022-09-15 DIAGNOSIS — L84 Corns and callosities: Secondary | ICD-10-CM | POA: Diagnosis not present

## 2022-10-04 ENCOUNTER — Encounter: Payer: Self-pay | Admitting: Legal Medicine

## 2022-10-04 ENCOUNTER — Ambulatory Visit (INDEPENDENT_AMBULATORY_CARE_PROVIDER_SITE_OTHER): Payer: Medicare Other | Admitting: Legal Medicine

## 2022-10-04 ENCOUNTER — Ambulatory Visit: Payer: Medicare Other | Attending: Cardiology

## 2022-10-04 VITALS — BP 120/68 | HR 106 | Temp 97.6°F | Resp 15 | Ht 64.0 in | Wt 142.0 lb

## 2022-10-04 DIAGNOSIS — H3530 Unspecified macular degeneration: Secondary | ICD-10-CM | POA: Insufficient documentation

## 2022-10-04 DIAGNOSIS — I482 Chronic atrial fibrillation, unspecified: Secondary | ICD-10-CM

## 2022-10-04 DIAGNOSIS — Z7901 Long term (current) use of anticoagulants: Secondary | ICD-10-CM

## 2022-10-04 DIAGNOSIS — E782 Mixed hyperlipidemia: Secondary | ICD-10-CM

## 2022-10-04 DIAGNOSIS — H353131 Nonexudative age-related macular degeneration, bilateral, early dry stage: Secondary | ICD-10-CM | POA: Diagnosis not present

## 2022-10-04 DIAGNOSIS — I10 Essential (primary) hypertension: Secondary | ICD-10-CM | POA: Diagnosis not present

## 2022-10-04 DIAGNOSIS — E538 Deficiency of other specified B group vitamins: Secondary | ICD-10-CM | POA: Diagnosis not present

## 2022-10-04 DIAGNOSIS — R7303 Prediabetes: Secondary | ICD-10-CM | POA: Diagnosis not present

## 2022-10-04 DIAGNOSIS — I119 Hypertensive heart disease without heart failure: Secondary | ICD-10-CM

## 2022-10-04 DIAGNOSIS — N3941 Urge incontinence: Secondary | ICD-10-CM

## 2022-10-04 DIAGNOSIS — D68311 Acquired hemophilia: Secondary | ICD-10-CM

## 2022-10-04 LAB — POCT INR: INR: 3.5 — AB (ref 2.0–3.0)

## 2022-10-04 NOTE — Patient Instructions (Addendum)
Description   HOLD today's dose and then continue taking  2.'5mg'$  daily except 1.'25mg'$  on Mondays.  Stay consistent with greens (2-3 times per week).  Repeat INR in 3 weeks.  Coumadin Clinic: [phone]

## 2022-10-04 NOTE — Progress Notes (Signed)
Subjective:  Patient ID: [name], female    DOB: 03-22-1932  Age: 88 y.o. MRN: [id number]  Chief Complaint  Patient presents with   Hypertension   Hyperlipidemia   Prediabetes    HPI: chronic Patient presents with hyperlipidemia.  Compliance with treatment has been good; patient takes medicines as directed, maintains low cholesterol diet, follows up as directed, and maintains exercise regimen.  Patient is using Atorvastatin 80 mg daily without problems.    Patient presents for follow up of hypertension.  Patient tolerating Lisinopril 20 mg daily, Diltiazem 120 mg daily well without side effects.  Patient is working on maintaining diet and exercise regimen and follows up as directed. Needs to see cardiology.  Depression: Taking Sertraline 100 mg daily.  Urge incontinence: She is taking Oxybutynin 5 mg TID.  Prediabetes, does not check glucose, called in one touch  Chronc low back pain- on hydrocodone from Dr. Nelva Bush  INR 3.5 today, hold coumadin Current Outpatient Medications on File Prior to Visit  Medication Sig Dispense Refill   ascorbic acid (VITAMIN C) 500 MG tablet Take 600 mg by mouth daily.      Biotin 1000 MCG tablet Take 1 tablet by mouth daily.      Calcium Carbonate-Vitamin D (CALCIUM + D PO) Take by mouth daily.     cephALEXin (KEFLEX) 500 MG capsule Take 1 capsule (500 mg total) by mouth 2 (two) times daily. 20 capsule 0   Cholecalciferol (VITAMIN D PO) Take by mouth daily.     cyanocobalamin (,VITAMIN B-12,) 1000 MCG/ML injection Inject 1 mL (1,000 mcg total) into the muscle every 30 (thirty) days. 10 mL 3   diltiazem (CARDIZEM CD) 120 MG 24 hr capsule Take 120 mg by mouth daily.     gabapentin (NEURONTIN) 300 MG capsule Take by mouth.     lidocaine (LIDODERM) 5 % 1 patch daily.     lisinopril (ZESTRIL) 20 MG tablet Take 1 tablet (20 mg total) by mouth daily. 90 tablet 2   oxybutynin (DITROPAN) 5 MG tablet Take 1 tablet (5 mg total) by mouth 3 (three) times  daily. 90 tablet 2   oxyCODONE-acetaminophen (PERCOCET) 10-325 MG tablet Take 1 tablet by mouth 2 (two) times daily as needed.     pantoprazole (PROTONIX) 40 MG tablet Take 40 mg by mouth daily.     potassium chloride SA (KLOR-CON M) 20 MEQ tablet TAKE ONE TABLET BY MOUTH TWICE DAILY 180 tablet 3   potassium chloride SA (KLOR-CON M) 20 MEQ tablet Take by mouth.     sertraline (ZOLOFT) 100 MG tablet Take 1 tablet (100 mg total) by mouth daily. 90 tablet 0   vitamin A 8000 UNIT capsule Take 8,000 Units by mouth daily.     warfarin (COUMADIN) 2.5 MG tablet Take 1/2 tablet to 1 tablet daily by mouth or as directed by Anticoagulation Clinic. 90 tablet 0   Zinc Picolinate POWD Take by mouth.     atorvastatin (LIPITOR) 80 MG tablet Take 1 tablet (80 mg total) by mouth daily. 90 tablet 3   No current facility-administered medications on file prior to visit.   Past Medical History:  Diagnosis Date   Arthritis    Back pain, chronic    Coronary artery disease CARDIOLOGIST- DR [name]--  LOV IN EPIC   Depression    Hammer toe    Hypercholesterolemia    Hypertensive heart disease    WITH LVH   LVH (left ventricular hypertrophy) due to hypertensive disease  Mitral insufficiency    CHRONIC   Pulmonary hypertension ([place])    Transfusion history    Past Surgical History:  Procedure Laterality Date   ABDOMINAL AORTOGRAM W/LOWER EXTREMITY N/A 04/29/2020   Procedure: ABDOMINAL AORTOGRAM W/LOWER EXTREMITY;  Surgeon: [name], MD;  Location: [facility];  Service: Cardiovascular;  Laterality: N/A;  Lt Leg   ABDOMINAL HYSTERECTOMY     ANTERIOR REPAIR/ [place] PUBOVAGINAL SLING  05-03-2002   BUNIONECTOMY WITH HAMMERTOE RECONSTRUCTION Right 03/01/2013   Procedure: RIGHT FOOT EXCISION BUNIONETTE AND FUSION OF DIP FOURTH TOE;  Surgeon: [name], MD;  Location: [facility];  Service: Orthopedics;  Laterality: Right;   CARDIAC CATHETERIZATION  02-03-2009  DR  [name]    SINGLE-VESSEL OBSTRUCTIVE CAD, SMALL DIAGONAL BRANCH/ NORMAL LVF/ SEVERE MITRAL INSUFFICIENCY/ SEVERE PULMONARY HYPERTENSION   CATARACT EXTRACTION W/ INTRAOCULAR LENS  IMPLANT, BILATERAL  2003   COLONOSCOPY  07/25/2014   few mall scattered diverticula in the ectire examined colon. Small internal hemmorhoids.    HAMMER TOE SURGERY  02/28/2012   Procedure: HAMMER TOE CORRECTION;  Surgeon: [name], MD;  Location: [facility];  Service: Orthopedics;  Laterality: Right;  FUSION OF PROXIMAL INTERPHALANGEAL  RIGHT THIRD CLAW TOE   PARS PLANA VITRECTOMY W/ REPAIR OF MACULAR HOLE  02-04-2002   LEFT EYE   PERIPHERAL VASCULAR BALLOON ANGIOPLASTY  04/29/2020   Procedure: PERIPHERAL VASCULAR BALLOON ANGIOPLASTY;  Surgeon: [name], MD;  Location: [facility];  Service: Cardiovascular;;  Lt. AT   REPAIR RIGHT SECOND CLAW TOE/ VARUS MEDIAL CLOSING WEDGE OSTEOTOMY PROXIMAL PHALANX RIGHT GREAT TOE  05-05-2005   TOTAL HIP ARTHROPLASTY     BILATERAL---  LEFT 4/98;   RIGHT  11/98   VARUS OSTEOTOMY PROXIMAL PHALANX, LEFT GREAT TOE/ PARING DOWN THE CALLUS , SECOND LEFT TOE  10-27-2008    Family History  Problem Relation Age of Onset   Heart disease Mother 69   Prostate cancer Father 52   Social History   Socioeconomic History   Marital status: Divorced    Spouse name: Not on file   Number of children: 3   Years of education: College   Highest education level: Not on file  Occupational History   Occupation: Retired    Fish farm manager: RETIRED  Tobacco Use   Smoking status: Never   Smokeless tobacco: Never  Vaping Use   Vaping Use: Never used  Substance and Sexual Activity   Alcohol use: Not Currently    Alcohol/week: 1.0 standard drink of alcohol    Types: 1 Glasses of wine per week    Comment: occasional   Drug use: Yes    Types: Hydrocodone   Sexual activity: Not Currently  Other Topics Concern   Not on file  Social History Narrative   Patient lives at home  alone.   Caffeine Use: 1 cup daily occasionally   Social Determinants of Health   Financial Resource Strain: Low Risk  (05/17/2022)   Overall Financial Resource Strain (CARDIA)    Difficulty of Paying Living Expenses: Not hard at all  Food Insecurity: No Food Insecurity (03/02/2022)   Hunger Vital Sign    Worried About Running Out of Food in the Last Year: Never true    Ran Out of Food in the Last Year: Never true  Transportation Needs: Unmet Transportation Needs (03/02/2022)   PRAPARE - Hydrologist (Medical): Yes    Lack of Transportation (Non-Medical):  No  Physical Activity: Inactive (05/17/2022)   Exercise Vital Sign    Days of Exercise per Week: 0 days    Minutes of Exercise per Session: 0 min  Stress: Stress Concern Present (03/02/2022)   [place]    Feeling of Stress : To some extent  Social Connections: Not on file    Review of Systems  Constitutional:  Positive for fatigue. Negative for chills and fever.  HENT:  Negative for congestion, ear pain and sore throat.   Eyes:  Negative for visual disturbance.  Respiratory:  Positive for shortness of breath. Negative for cough.   Cardiovascular:  Negative for chest pain and palpitations.  Gastrointestinal:  Positive for diarrhea. Negative for abdominal pain, constipation, nausea and vomiting.  Endocrine: Negative for polydipsia, polyphagia and polyuria.  Genitourinary:  Negative for difficulty urinating and dysuria.  Musculoskeletal:  Negative for arthralgias, back pain and myalgias.  Skin:  Negative for rash.  Neurological:  Negative for headaches.  Psychiatric/Behavioral:  Negative for dysphoric mood. The patient is not nervous/anxious.      Objective:  BP 120/68 (BP Location: Right Arm, Patient Position: Sitting)   Pulse (!) 106   Temp 97.6 F (36.4 C)   Resp 15   Ht '5\' 4"'$  (1.626 m)   Wt 142 lb (64.4 kg)   SpO2 96%   BMI  24.37 kg/m      10/04/2022   10:32 AM 06/28/2022   11:56 AM 06/28/2022   10:42 AM  BP/Weight  Systolic BP 502 774 128  Diastolic BP 68 82 70  Wt. (Lbs) 142  143  BMI 24.37 kg/m2  24.55 kg/m2    Physical Exam Vitals reviewed.  Constitutional:      General: She is not in acute distress.    Appearance: Normal appearance.  HENT:     Head: Normocephalic.     Right Ear: Tympanic membrane normal.     Left Ear: Tympanic membrane normal.     Nose: Nose normal.     Mouth/Throat:     Mouth: Mucous membranes are moist.     Pharynx: Oropharynx is clear.  Eyes:     Extraocular Movements: Extraocular movements intact.     Conjunctiva/sclera: Conjunctivae normal.     Pupils: Pupils are equal, round, and reactive to light.  Cardiovascular:     Rate and Rhythm: Normal rate and regular rhythm.     Pulses: Normal pulses.     Heart sounds: Murmur heard.  Pulmonary:     Effort: Pulmonary effort is normal. No respiratory distress.     Breath sounds: Normal breath sounds. No wheezing.  Abdominal:     General: Abdomen is flat. Bowel sounds are normal. There is no distension.     Palpations: Abdomen is soft.     Tenderness: There is no abdominal tenderness.  Musculoskeletal:        General: Normal range of motion.     Cervical back: Normal range of motion.  Skin:    General: Skin is warm.     Capillary Refill: Capillary refill takes less than 2 seconds.  Neurological:     General: No focal deficit present.     Mental Status: She is alert and oriented to person, place, and time. Mental status is at baseline.     Motor: Weakness present.     Comments: Uses rollator     Diabetic Foot Exam - Simple   Simple Foot Form Diabetic Foot  exam was performed with the following findings: Yes 10/04/2022 11:32 AM  Visual Inspection No deformities, no ulcerations, no other skin breakdown bilaterally: Yes Sensation Testing See comments: Yes Pulse Check Posterior Tibialis and Dorsalis pulse intact  bilaterally: Yes Comments No sensation feet      Lab Results  Component Value Date   WBC 7.5 06/28/2022   HGB 12.3 06/28/2022   HCT 38.3 06/28/2022   PLT 280 06/28/2022   GLUCOSE 109 (H) 06/28/2022   CHOL 131 06/28/2022   TRIG 126 06/28/2022   HDL 46 06/28/2022   LDLCALC 63 06/28/2022   ALT 16 06/28/2022   AST 28 06/28/2022   NA 144 06/28/2022   K 4.8 06/28/2022   CL 105 06/28/2022   CREATININE 0.82 06/28/2022   BUN 18 06/28/2022   CO2 22 06/28/2022   TSH 2.560 12/09/2021   INR 3.5 (A) 10/04/2022   HGBA1C 6.2 (H) 06/28/2022   MICROALBUR 30 05/13/2020      Assessment & Plan:   Problem List Items Addressed This Visit       Cardiovascular and Mediastinum   Essential hypertension   Relevant Orders   Comprehensive metabolic panel   CBC with Differential/Platelet An individual hypertension care plan was established and reinforced today.  The patient's status was assessed using clinical findings on exam and labs or diagnostic tests. The patient's success at meeting treatment goals on disease specific evidence-based guidelines and found to be well controlled. SELF MANAGEMENT: The patient and I together assessed ways to personally work towards obtaining the recommended goals. RECOMMENDATIONS: avoid decongestants found in common cold remedies, decrease consumption of alcohol, perform routine monitoring of BP with home BP cuff, exercise, reduction of dietary salt, take medicines as prescribed, try not to miss doses and quit smoking.  Regular exercise and maintaining a healthy weight is needed.  Stress reduction may help. A CLINICAL SUMMARY including written plan identify barriers to care unique to individual due to social or financial issues.  We attempt to mutually creat solutions for individual and family understanding.     LVH (left ventricular hypertrophy) due to hypertensive disease Patient has LVH- stable     Hematopoietic and Hemostatic   Acquired  thrombophilia Patient is on coumadin     Other   Hyperlipidemia   Relevant Orders   Lipid panel AN INDIVIDUAL CARE PLAN for hyperlipidemia/ cholesterol was established and reinforced today.  The patient's status was assessed using clinical findings on exam, lab and other diagnostic tests. The patient's disease status was assessed based on evidence-based guidelines and found to be fair controlled. MEDICATIONS were reviewed. SELF MANAGEMENT GOALS have been discussed and patient's success at attaining the goal of low cholesterol was assessed. RECOMMENDATION given include regular exercise 3 days a week and low cholesterol/low fat diet. CLINICAL SUMMARY including written plan to identify barriers unique to the patient due to social or economic  reasons was discussed.     Prediabetes   Relevant Orders   Hemoglobin A1c Patient is on diet and exercise    Urge incontinence - Primary Patient has urge incontinence    Macular degeneration Patient has dry macular degeneration   Other Visit Diagnoses     B12 deficiency       Relevant Orders   Vitamin B12 Check B12 levels     . A total of 30 minutes were spent face-to-face with the patient during this encounter and over half of that time was spent on counseling and coordination of care.  Orders Placed This Encounter  Procedures   Comprehensive metabolic panel   Hemoglobin A1c   Lipid panel   CBC with Differential/Platelet   Vitamin B12     Follow-up: Return in about 3 months (around 01/04/2023).  An After Visit Summary was printed and given to the patient.  Reinaldo Meeker, MD [facility] [phone]

## 2022-10-05 LAB — COMPREHENSIVE METABOLIC PANEL
ALT: 12 IU/L (ref 0–32)
AST: 20 IU/L (ref 0–40)
Albumin/Globulin Ratio: 1.1 — ABNORMAL LOW (ref 1.2–2.2)
Albumin: 3.8 g/dL (ref 3.7–4.7)
Alkaline Phosphatase: 132 IU/L — ABNORMAL HIGH (ref 44–121)
BUN/Creatinine Ratio: 18 (ref 12–28)
BUN: 16 mg/dL (ref 8–27)
Bilirubin Total: 0.5 mg/dL (ref 0.0–1.2)
CO2: 20 mmol/L (ref 20–29)
Calcium: 9.7 mg/dL (ref 8.7–10.3)
Chloride: 103 mmol/L (ref 96–106)
Creatinine, Ser: 0.89 mg/dL (ref 0.57–1.00)
Globulin, Total: 3.5 g/dL (ref 1.5–4.5)
Glucose: 101 mg/dL — ABNORMAL HIGH (ref 70–99)
Potassium: 4.6 mmol/L (ref 3.5–5.2)
Sodium: 141 mmol/L (ref 134–144)
Total Protein: 7.3 g/dL (ref 6.0–8.5)
eGFR: 62 mL/min/{1.73_m2} (ref >59)

## 2022-10-05 LAB — CBC WITH DIFFERENTIAL/PLATELET
Basophils Absolute: 0 10*3/uL (ref 0.0–0.2)
Basos: 0 %
EOS (ABSOLUTE): 0.1 10*3/uL (ref 0.0–0.4)
Eos: 1 %
Hematocrit: 36.5 % (ref 34.0–46.6)
Hemoglobin: 11.3 g/dL (ref 11.1–15.9)
Immature Grans (Abs): 0 10*3/uL (ref 0.0–0.1)
Immature Granulocytes: 0 %
Lymphocytes Absolute: 0.8 10*3/uL (ref 0.7–3.1)
Lymphs: 10 %
MCH: 27.3 pg (ref 26.6–33.0)
MCHC: 31 g/dL — ABNORMAL LOW (ref 31.5–35.7)
MCV: 88 fL (ref 79–97)
Monocytes Absolute: 0.6 10*3/uL (ref 0.1–0.9)
Monocytes: 8 %
Neutrophils Absolute: 6.3 10*3/uL (ref 1.4–7.0)
Neutrophils: 81 %
Platelets: 353 10*3/uL (ref 150–450)
RBC: 4.14 x10E6/uL (ref 3.77–5.28)
RDW: 13.9 % (ref 11.7–15.4)
WBC: 7.9 10*3/uL (ref 3.4–10.8)

## 2022-10-05 LAB — LIPID PANEL
Chol/HDL Ratio: 2.9 ratio (ref 0.0–4.4)
Cholesterol, Total: 115 mg/dL (ref 100–199)
HDL: 40 mg/dL (ref >39)
LDL Chol Calc (NIH): 54 mg/dL (ref 0–99)
Triglycerides: 115 mg/dL (ref 0–149)
VLDL Cholesterol Cal: 21 mg/dL (ref 5–40)

## 2022-10-05 LAB — HEMOGLOBIN A1C
Est. average glucose Bld gHb Est-mCnc: 137 mg/dL
Hgb A1c MFr Bld: 6.4 % — ABNORMAL HIGH (ref 4.8–5.6)

## 2022-10-05 LAB — VITAMIN B12: Vitamin B-12: 1168 pg/mL (ref 232–1245)

## 2022-10-05 LAB — CARDIOVASCULAR RISK ASSESSMENT

## 2022-10-05 NOTE — Progress Notes (Signed)
Glucose 101, kidney tests normal, liver tests normal, A1c 6.4 , CBC normal, Cholesterol normal, B12 level 1, 168- still normal,  lp

## 2022-10-12 DIAGNOSIS — M25551 Pain in right hip: Secondary | ICD-10-CM | POA: Diagnosis not present

## 2022-10-12 DIAGNOSIS — M25552 Pain in left hip: Secondary | ICD-10-CM | POA: Diagnosis not present

## 2022-10-12 DIAGNOSIS — M5459 Other low back pain: Secondary | ICD-10-CM | POA: Diagnosis not present

## 2022-10-15 DIAGNOSIS — M5416 Radiculopathy, lumbar region: Secondary | ICD-10-CM | POA: Diagnosis not present

## 2022-10-18 NOTE — Progress Notes (Deleted)
[name] Date of Birth: 27-Mar-1932   History of Present Illness: [name] is seen for follow up Afib. She has a history of chronic atrial fibrillation, hypertensive heart disease, and pulmonary hypertension. She has a history of chronic back problems. She has moderate MR and COPD. In October 2019 she developed progressive weakness in her legs. We held her lipitor without improvement. MRI was done. This showed some chronic fatty atrophy of the muscles c/w denervation in the peroneal nerve distribution. We had discussed switching to a DOAC but she could not afford it. INR is followed in [place].  She is followed by podiatry for evaluation of a left foot ulcer. LE arterial dopplers done as noted below. She was seen by [name] and underwent angiography in June 2021. This  showed no significant aortoiliac disease.  There was mild to moderate left SFA and popliteal disease with one-vessel runoff below the knee via the peroneal artery which was free of significant disease.  There was mild disease in the TP trunk.  The anterior tibial and posterior tibial arteries were both occluded with reconstitution distally via the communicating branches of the peroneal artery with intact pedal arch.  He attempted revascularization of the anterior tibial artery to give her more blood flow but was not able to cross the occlusion antegrade given the length of the occlusion. Medical management recommended.   On follow up today she is doing OK. Denies any chest pain. Breathing is doing well. No palpitations or swelling. No new foot ulcers.  She was admitted to the hospital in [place] in April following a fall with vertebral fractures. Had vertebroplasty. States she is really fearful of falling again.    Current Outpatient Medications on File Prior to Visit  Medication Sig Dispense Refill   ascorbic acid (VITAMIN C) 500 MG tablet Take 600 mg by mouth daily.      atorvastatin (LIPITOR) 80 MG tablet Take 1  tablet (80 mg total) by mouth daily. 90 tablet 3   Biotin 1000 MCG tablet Take 1 tablet by mouth daily.      Calcium Carbonate-Vitamin D (CALCIUM + D PO) Take by mouth daily.     cephALEXin (KEFLEX) 500 MG capsule Take 1 capsule (500 mg total) by mouth 2 (two) times daily. 20 capsule 0   Cholecalciferol (VITAMIN D PO) Take by mouth daily.     cyanocobalamin (,VITAMIN B-12,) 1000 MCG/ML injection Inject 1 mL (1,000 mcg total) into the muscle every 30 (thirty) days. 10 mL 3   diltiazem (CARDIZEM CD) 120 MG 24 hr capsule Take 120 mg by mouth daily.     gabapentin (NEURONTIN) 300 MG capsule Take by mouth.     lidocaine (LIDODERM) 5 % 1 patch daily.     lisinopril (ZESTRIL) 20 MG tablet Take 1 tablet (20 mg total) by mouth daily. 90 tablet 2   oxybutynin (DITROPAN) 5 MG tablet Take 1 tablet (5 mg total) by mouth 3 (three) times daily. 90 tablet 2   oxyCODONE-acetaminophen (PERCOCET) 10-325 MG tablet Take 1 tablet by mouth 2 (two) times daily as needed.     pantoprazole (PROTONIX) 40 MG tablet Take 40 mg by mouth daily.     potassium chloride SA (KLOR-CON M) 20 MEQ tablet TAKE ONE TABLET BY MOUTH TWICE DAILY 180 tablet 3   potassium chloride SA (KLOR-CON M) 20 MEQ tablet Take by mouth.     sertraline (ZOLOFT) 100 MG tablet Take 1 tablet (100 mg total) by mouth daily. [place]  tablet 0   vitamin A 8000 UNIT capsule Take 8,000 Units by mouth daily.     warfarin (COUMADIN) 2.5 MG tablet Take 1/2 tablet to 1 tablet daily by mouth or as directed by Anticoagulation Clinic. 90 tablet 0   Zinc Picolinate POWD Take by mouth.     No current facility-administered medications on file prior to visit.    Allergies  Allergen Reactions   Sulfamethoxazole     Other reaction(s): Other (See Comments) Not listed   Sulfanilamide     unkn Other reaction(s): Other (See Comments) unkn   Sulfur Dioxide     Other reaction(s): Other (See Comments)   Penicillins Rash    Past Medical History:  Diagnosis Date    Arthritis    Back pain, chronic    Coronary artery disease CARDIOLOGIST- DR [name]--  LOV IN EPIC   Depression    Hammer toe    Hypercholesterolemia    Hypertensive heart disease    WITH LVH   LVH (left ventricular hypertrophy) due to hypertensive disease    Mitral insufficiency    CHRONIC   Pulmonary hypertension ([place])    Transfusion history     Past Surgical History:  Procedure Laterality Date   ABDOMINAL AORTOGRAM W/LOWER EXTREMITY N/A 04/29/2020   Procedure: ABDOMINAL AORTOGRAM W/LOWER EXTREMITY;  Surgeon: [name], MD;  Location: [facility];  Service: Cardiovascular;  Laterality: N/A;  Lt Leg   ABDOMINAL HYSTERECTOMY     ANTERIOR REPAIR/ [place] PUBOVAGINAL SLING  05-03-2002   BUNIONECTOMY WITH HAMMERTOE RECONSTRUCTION Right 03/01/2013   Procedure: RIGHT FOOT EXCISION BUNIONETTE AND FUSION OF DIP FOURTH TOE;  Surgeon: [name], MD;  Location: [facility];  Service: Orthopedics;  Laterality: Right;   CARDIAC CATHETERIZATION  02-03-2009  DR  [name]   SINGLE-VESSEL OBSTRUCTIVE CAD, SMALL DIAGONAL BRANCH/ NORMAL LVF/ SEVERE MITRAL INSUFFICIENCY/ SEVERE PULMONARY HYPERTENSION   CATARACT EXTRACTION W/ INTRAOCULAR LENS  IMPLANT, BILATERAL  2003   COLONOSCOPY  07/25/2014   few mall scattered diverticula in the ectire examined colon. Small internal hemmorhoids.    HAMMER TOE SURGERY  02/28/2012   Procedure: HAMMER TOE CORRECTION;  Surgeon: [name], MD;  Location: [facility];  Service: Orthopedics;  Laterality: Right;  FUSION OF PROXIMAL INTERPHALANGEAL  RIGHT THIRD CLAW TOE   PARS PLANA VITRECTOMY W/ REPAIR OF MACULAR HOLE  02-04-2002   LEFT EYE   PERIPHERAL VASCULAR BALLOON ANGIOPLASTY  04/29/2020   Procedure: PERIPHERAL VASCULAR BALLOON ANGIOPLASTY;  Surgeon: [name], MD;  Location: [facility];  Service: Cardiovascular;;  Lt. AT   REPAIR RIGHT SECOND CLAW TOE/ VARUS MEDIAL CLOSING WEDGE OSTEOTOMY PROXIMAL  PHALANX RIGHT GREAT TOE  05-05-2005   TOTAL HIP ARTHROPLASTY     BILATERAL---  LEFT 4/98;   RIGHT  11/98   VARUS OSTEOTOMY PROXIMAL PHALANX, LEFT GREAT TOE/ PARING DOWN THE CALLUS , SECOND LEFT TOE  10-27-2008    Social History   Tobacco Use  Smoking Status Never  Smokeless Tobacco Never    Social History   Substance and Sexual Activity  Alcohol Use Not Currently   Alcohol/week: 1.0 standard drink of alcohol   Types: 1 Glasses of wine per week   Comment: occasional    Family History  Problem Relation Age of Onset   Heart disease Mother 58   Prostate cancer Father 48    Review of Systems:  As noted in history of present illness.  All other systems  were reviewed and are negative.  Physical Exam: There were no vitals taken for this visit. GENERAL:  Well appearing WF in NAD HEENT:  PERRL, EOMI, sclera are clear. Oropharynx is clear. NECK:  No jugular venous distention, carotid upstroke brisk and symmetric, no bruits, no thyromegaly or adenopathy LUNGS:  Clear to auscultation bilaterally CHEST:  Unremarkable HEART:  RRR,  PMI not displaced or sustained,S1 and S2 within normal limits, no S3, no S4: no clicks, no rubs, gr 2/6 systolic murmur apex.  ABD:  Soft, nontender. BS +, no masses or bruits. No hepatomegaly, no splenomegaly EXT: poor pedal pulses, no edema, venous varicosities. There a a calloused area over the lateral metatarsal joint on the left with ulceration. No redness or drainage.  SKIN:  Warm and dry.  No rashes NEURO:  Alert and oriented x 3. Cranial nerves II through XII intact. PSYCH:  Cognitively intact     LABORATORY DATA:  Lab Results  Component Value Date   WBC 7.9 10/04/2022   HGB 11.3 10/04/2022   HCT 36.5 10/04/2022   PLT 353 10/04/2022   GLUCOSE 101 (H) 10/04/2022   CHOL 115 10/04/2022   TRIG 115 10/04/2022   HDL 40 10/04/2022   LDLCALC 54 10/04/2022   ALT 12 10/04/2022   AST 20 10/04/2022   NA 141 10/04/2022   K 4.6 10/04/2022   CL  103 10/04/2022   CREATININE 0.89 10/04/2022   BUN 16 10/04/2022   CO2 20 10/04/2022   TSH 2.560 12/09/2021   INR 3.5 (A) 10/04/2022   HGBA1C 6.4 (H) 10/04/2022   MICROALBUR 30 05/13/2020    Labs reviewed from 02/24/17: cholesterol 174, triglycerides 110, LDL 90, HDL 62. A1c 6.0%. CMET and TSH normal. Dated 08/24/17: cholesterol 157, triglycerides 106, HDL 55, LDL 90. A1c 6.1%. CMET and CBC normal  Ecg today shows Afib with rate 97 bpm. Possible old septal infarct. Nonspecific ST abnormality.   LE arterial dopplers 03/06/20:  Summary:  Left: Moderate progression is noted compared to previous study.   Duplex imaging showed heterogenous plaque in the CFA, DFA, SFA, popliteal  artery and TPT. Medial calcifications noted in the tibial vessels.  50-74% stenosis in the TPT, based on velocity ratio.  Probable one vessel run-off via peroneal artery.  Total occlusion noted in the PTA.  Occluded proximal to mid ATA.   LE ABIs 03/06/20: Right ABIs appear increased compared to prior study on 02/21/2013. Left  ABIs appear essentially unchanged compared to prior study on 02/21/2013.  Left ABIs are unreliable; arterial wall calcification precludes accurate  ankle pressure and ABIs. Right TBIs  are essentially unchanged; left TBIs have decreased.  Summary:  Right: Resting right ankle-brachial index indicates noncompressible right  lower extremity arteries. The right toe-brachial index is normal.   Left: The left toe-brachial index is abnormal. ABIs are unreliable.  Arterial wall calcification precludes accurate ankle pressure and ABIs.      Assessment / Plan: 1. Atrial fibrillation, permanent. Rate is  controlled on diltiazem. She is anticoagulated on Coumadin. Check INR today.  2. Mild to Moderate MR. Last Echo 8/16. Asymptomatic. Exam is unchanged.   3. Hypertension with hypertensive heart disease, BP is controlled.  4. Hyperlipidemia on statin therapy. Last LDL 63.    On lipitor at 80 mg  daily.  Encourage healthy eating.   5. Pulmonary hypertension-moderate  6. Coronary disease. Cardiac catheterization in 2010 showed severe stenosis in a small diagonal branch. She otherwise had no significant disease. She is asymptomatic.  7. Chronic bronchitis and COPD. Chronic dyspnea. Will continue Spiriva.   8. PAD - has single vessel run off below the knee. Has nonhealing ulcer on the left. Unable to cross occlusion percutaneously. Continue medical management. Ulcer is stable.   Follow up in 6 months

## 2022-10-20 ENCOUNTER — Other Ambulatory Visit: Payer: Self-pay | Admitting: Legal Medicine

## 2022-10-20 DIAGNOSIS — N3941 Urge incontinence: Secondary | ICD-10-CM

## 2022-10-24 ENCOUNTER — Ambulatory Visit: Payer: Medicare Other

## 2022-10-24 ENCOUNTER — Ambulatory Visit: Payer: Medicare Other | Admitting: Cardiology

## 2022-11-08 ENCOUNTER — Ambulatory Visit: Payer: Medicare Other

## 2022-11-08 ENCOUNTER — Telehealth: Payer: Self-pay | Admitting: Cardiology

## 2022-11-08 ENCOUNTER — Telehealth: Payer: Self-pay

## 2022-11-08 NOTE — Telephone Encounter (Signed)
Pt canceled anticoagulation clinic appt today. Called pt to rescheduled, no answer. Left message on voicemail.

## 2022-11-08 NOTE — Telephone Encounter (Signed)
error 

## 2022-11-09 NOTE — Telephone Encounter (Signed)
Pt is returning call. Pt is requesting call back from [place].

## 2022-11-09 NOTE — Telephone Encounter (Signed)
Returned pt's call and rescheduled anticoagulation appt.

## 2022-11-16 ENCOUNTER — Other Ambulatory Visit: Payer: Self-pay

## 2022-11-16 DIAGNOSIS — F324 Major depressive disorder, single episode, in partial remission: Secondary | ICD-10-CM

## 2022-11-16 MED ORDER — SERTRALINE HCL 100 MG PO TABS: 100.0000 mg | ORAL_TABLET | Freq: Every day | ORAL | 0 refills | Status: DC

## 2022-11-20 ENCOUNTER — Other Ambulatory Visit: Payer: Self-pay | Admitting: Cardiology

## 2022-11-20 DIAGNOSIS — I4811 Longstanding persistent atrial fibrillation: Secondary | ICD-10-CM

## 2022-11-22 ENCOUNTER — Ambulatory Visit: Payer: Medicare Other

## 2022-11-22 ENCOUNTER — Ambulatory Visit: Payer: Medicare Other | Attending: Cardiology

## 2022-11-22 DIAGNOSIS — Z7901 Long term (current) use of anticoagulants: Secondary | ICD-10-CM

## 2022-11-22 DIAGNOSIS — I482 Chronic atrial fibrillation, unspecified: Secondary | ICD-10-CM

## 2022-11-22 LAB — POCT INR: INR: 5.4 — AB (ref 2.0–3.0)

## 2022-11-22 NOTE — Patient Instructions (Signed)
Description   HOLD today's dose and tomorrow's dose and then START taking  2.'5mg'$  daily except 1.'25mg'$  on Mondays and Fridays.  Stay consistent with greens (2-3 times per week).  Repeat INR in 2 weeks.  Coumadin Clinic: [phone]

## 2022-11-29 DIAGNOSIS — M5451 Vertebrogenic low back pain: Secondary | ICD-10-CM | POA: Diagnosis not present

## 2022-12-06 ENCOUNTER — Ambulatory Visit: Payer: Medicare Other | Attending: Cardiology

## 2022-12-06 DIAGNOSIS — Z7901 Long term (current) use of anticoagulants: Secondary | ICD-10-CM | POA: Diagnosis not present

## 2022-12-06 DIAGNOSIS — I482 Chronic atrial fibrillation, unspecified: Secondary | ICD-10-CM

## 2022-12-06 LAB — POCT INR: INR: 2.6 (ref 2.0–3.0)

## 2022-12-06 NOTE — Patient Instructions (Signed)
Description   Continue taking  2.'5mg'$  daily except 1.'25mg'$  on Mondays and Fridays.  Stay consistent with greens (2-3 times per week).  Repeat INR in 4 weeks.  Coumadin Clinic: [phone]

## 2022-12-19 ENCOUNTER — Other Ambulatory Visit: Payer: Self-pay | Admitting: Cardiology

## 2022-12-26 ENCOUNTER — Telehealth: Payer: Self-pay

## 2022-12-26 NOTE — Progress Notes (Signed)
Care Management & Coordination Services Pharmacy Team  Reason for Encounter: Appointment Reminder  Contacted patient to confirm telephone appointment with [place] Constable, PharmD on 12/28/22 at 2:00pm.  Unsuccessful outreach. Left voicemail for patient to return call.   Chart review:  Recent office visits:  10/04/22 Reinaldo Meeker MD. Seen for routine visit. No med changes.   Recent consult visits:  12/06/22 (Cardiology) Lisette Abu RN. Anti-Coag Visit. Warfarin 1.25 mg (2.5 mg x 0.5) every Mon, Fri; 2.5 mg (2.5 mg x 1) all other days.  11/22/22 (Cardiology) Lisette Abu RN. Anti-Coag Visit. Warfarin 1.25 mg (2.5 mg x 0.5) every Mon, Fri; 2.5 mg (2.5 mg x 1) all other days   10/05/23 (Cardiology) Lisette Abu RN. Anti-Coag Visit. Warfarin 1.25 mg (2.5 mg x 0.5) every Mon; 2.5 mg (2.5 mg x 1) all other days   09/15/22 (Podiatry) Estella Husk DPM. Seen for hammer toe. No med changes.  08/24/23 (Cardiology) Lisette Abu RN. Anti-Coag Visit. Warfarin 1.25 mg (2.5 mg x 0.5) every Mon; 2.5 mg (2.5 mg x 1) all other days    Hospital visits:  None   Star Rating Drugs:  Medication:  Last Fill: Day Supply Atorvastatin   12/06/22-09/06/22 90ds Lisinopril   10/05/22-07/07/22 90ds  Care Gaps: Annual wellness visit in last year? Yes, 03/02/22  If Diabetic: Last eye exam / retinopathy screening: 12/23/20 Last diabetic foot exam:None noted    Elray Mcgregor, [place] Pharmacist Assistant  [phone]

## 2022-12-28 ENCOUNTER — Ambulatory Visit: Payer: Medicare Other

## 2022-12-28 NOTE — Patient Outreach (Signed)
Care Management & Coordination Services Pharmacy Note  12/28/2022 Name:  [name] MRN:  [id number] DOB:  May 02, 1932  Summary: -Pleasant 89 year old female presents for initial CCM visit. She was born in [place], [place]. Her mother/Father divorced when she was 45.73 years old so she moved to [place] with her mom (That's where her mom grew up) and visited her father occasionally. 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 She loves to walk, be outdoors, and go to parks. In the winter she loves to Ski (After she had Total Hip Sx x2, she changed to [employer]) and ice skate. In the summer, she loves going to parks and walking. Now that she is using a walker, she in unable to get outside as much. She moved to [place] to be with her two daughters so they could help her. However, the first daughter fractured her Achilles Tendon and was bed-ridden for 4 months and the other daughter is having walking issues. Patient now spends time watching her favourite sports (Rams #1, [employer] Packers #2, in addition to tennis and golf). She also loves to knit and crochet. She hasn't heard from her son in 77 years. She doesn't like [place] because she dislikes the summers and can't get outside in the winters to have fun  Recommendations/Changes made from today's visit: -Patient was given notice of housing termination yesterday (12/27/22), sent direct msg to Noreene Larsson asking for assistance -Due to severity of housing access, we didn't speak of anything else other than housing  Subjective: [name] is an 89 y.o. year old female who is a primary patient of [name], MD (Inactive).  The care coordination team was consulted for assistance with disease management and care coordination needs.    Engaged with patient by telephone for follow up visit.  Recent office visits:  10/04/22 Reinaldo Meeker MD. Seen for routine visit. No med changes.    Recent consult  visits:  12/06/22 (Cardiology) Lisette Abu RN. Anti-Coag Visit. Warfarin 1.25 mg (2.5 mg x 0.5) every Mon, Fri; 2.5 mg (2.5 mg x 1) all other days.   11/22/22 (Cardiology) Lisette Abu RN. Anti-Coag Visit. Warfarin 1.25 mg (2.5 mg x 0.5) every Mon, Fri; 2.5 mg (2.5 mg x 1) all other days    10/05/23 (Cardiology) Lisette Abu RN. Anti-Coag Visit. Warfarin 1.25 mg (2.5 mg x 0.5) every Mon; 2.5 mg (2.5 mg x 1) all other days    09/15/22 (Podiatry) Estella Husk DPM. Seen for hammer toe. No med changes.   08/24/23 (Cardiology) Lisette Abu RN. Anti-Coag Visit. Warfarin 1.25 mg (2.5 mg x 0.5) every Mon; 2.5 mg (2.5 mg x 1) all other days      Hospital visits:  None   Objective:  Lab Results  Component Value Date   CREATININE 0.89 10/04/2022   BUN 16 10/04/2022   GFR 81.95 02/23/2012   EGFR 62 10/04/2022   GFRNONAA 51 (L) 12/08/2020   GFRAA 59 (L) 12/08/2020   NA 141 10/04/2022   K 4.6 10/04/2022   CALCIUM 9.7 10/04/2022   CO2 20 10/04/2022   GLUCOSE 101 (H) 10/04/2022    Lab Results  Component Value Date/Time   HGBA1C 6.4 (H) 10/04/2022 11:40 AM   HGBA1C 6.2 (H) 06/28/2022 11:34 AM   GFR 81.95 02/23/2012 11:55 AM   GFR 69.71 01/26/2012 09:24 AM   MICROALBUR 30 05/13/2020 04:20 PM   MICROALBUR 30 05/08/2019 12:46 PM  Last diabetic Eye exam:  Lab Results  Component Value Date/Time   HMDIABEYEEXA No Retinopathy 12/23/2020 12:00 AM    Last diabetic Foot exam: No results found for: "HMDIABFOOTEX"   Lab Results  Component Value Date   CHOL 115 10/04/2022   HDL 40 10/04/2022   LDLCALC 54 10/04/2022   TRIG 115 10/04/2022   CHOLHDL 2.9 10/04/2022       Latest Ref Rng & Units 10/04/2022   11:40 AM 06/28/2022   11:34 AM 03/15/2022    2:49 PM  Hepatic Function  Total Protein 6.0 - 8.5 g/dL 7.3  7.3  7.2   Albumin 3.7 - 4.7 g/dL 3.8  4.1  4.2   AST 0 - 40 IU/L 20  28  33   ALT 0 - 32 IU/L '12  16  20   '$ Alk Phosphatase 44 - 121 IU/L 132  106  119    Total Bilirubin 0.0 - 1.2 mg/dL 0.5  0.4  0.5     Lab Results  Component Value Date/Time   TSH 2.560 12/09/2021 09:57 AM   TSH 2.340 03/26/2020 10:30 AM       Latest Ref Rng & Units 10/04/2022   11:40 AM 06/28/2022   11:34 AM 03/15/2022    2:49 PM  CBC  WBC 3.4 - 10.8 x10E3/uL 7.9  7.5  6.4   Hemoglobin 11.1 - 15.9 g/dL 11.3  12.3  12.7   Hematocrit 34.0 - 46.6 % 36.5  38.3  39.6   Platelets 150 - 450 x10E3/uL 353  280  234     Lab Results  Component Value Date/Time   VD25OH 117.0 (H) 06/28/2022 11:34 AM   VD25OH 143.0 (H) 12/09/2021 09:57 AM   VITAMINB12 1,168 10/04/2022 11:40 AM   VITAMINB12 501 03/26/2014 10:02 AM    Clinical ASCVD: Yes  The ASCVD Risk score (Arnett DK, et al., 2019) failed to calculate for the following reasons:   The 2019 ASCVD risk score is only valid for ages 23 to 37    Other: (CHADS2VASc if Afib, MMRC or CAT for COPD, ACT, DEXA)     03/02/2022    8:09 PM 11/16/2021   10:54 AM 05/19/2021   10:39 AM  Depression screen PHQ 2/9  Decreased Interest 0 0 0  Down, Depressed, Hopeless 0 0 0  PHQ - 2 Score 0 0 0     Social History   Tobacco Use  Smoking Status Never  Smokeless Tobacco Never   BP Readings from Last 3 Encounters:  10/04/22 120/68  06/28/22 128/82  04/18/22 (!) 152/80   Pulse Readings from Last 3 Encounters:  10/04/22 (!) 106  06/28/22 (!) 110  04/18/22 97   Wt Readings from Last 3 Encounters:  10/04/22 142 lb (64.4 kg)  06/28/22 143 lb (64.9 kg)  04/18/22 147 lb (66.7 kg)   BMI Readings from Last 3 Encounters:  10/04/22 24.37 kg/m  06/28/22 24.55 kg/m  04/18/22 25.23 kg/m    Allergies  Allergen Reactions   Sulfamethoxazole     Other reaction(s): Other (See Comments) Not listed   Sulfanilamide     unkn Other reaction(s): Other (See Comments) unkn   Sulfur Dioxide     Other reaction(s): Other (See Comments)   Penicillins Rash    Medications Reviewed Today     Reviewed by [name], MD  (Physician) on 10/04/22 at 1116  Med List Status: <None>   Medication Order Taking? Sig Documenting Provider Last Dose Status Informant  ascorbic acid (VITAMIN C) 500 MG tablet [id number] Yes Take 600 mg by mouth daily.  [provider] Taking Active Self  atorvastatin (LIPITOR) 80 MG tablet [id number]  Take 1 tablet (80 mg total) by mouth daily. [name], Peter M, MD  Expired 08/30/22 2359   Biotin 1000 MCG tablet [id number] Yes Take 1 tablet by mouth daily.  [provider] Taking Active Self  Calcium Carbonate-Vitamin D (CALCIUM + D PO) [id number] Yes Take by mouth daily. [provider] Taking Active Self  cephALEXin (KEFLEX) 500 MG capsule [id number] Yes Take 1 capsule (500 mg total) by mouth 2 (two) times daily. [name], MD Taking Active   Cholecalciferol (VITAMIN D PO) [id number] Yes Take by mouth daily. [provider] Taking Active Self  cyanocobalamin (,VITAMIN B-12,) 1000 MCG/ML injection [id number] Yes Inject 1 mL (1,000 mcg total) into the muscle every 30 (thirty) days. [name], MD Taking Active   diltiazem [facility] CD) 120 MG 24 hr capsule [id number] Yes Take 120 mg by mouth daily. [provider] Taking Active   gabapentin (NEURONTIN) 300 MG capsule [id number] Yes Take by mouth. [provider] Taking Active   lidocaine (LIDODERM) 5 % [id number] Yes 1 patch daily. [provider] Taking Active   lisinopril (ZESTRIL) 20 MG tablet [id number] Yes Take 1 tablet (20 mg total) by mouth daily. [name], MD Taking Active   oxybutynin [facility]) 5 MG tablet [id number] Yes Take 1 tablet (5 mg total) by mouth 3 (three) times daily. [name], MD Taking Active   oxyCODONE-acetaminophen [facility]) 10-325 MG tablet [id number] Yes Take 1 tablet by mouth 2 (two) times daily as needed. [provider] Taking Active   pantoprazole (PROTONIX) 40 MG tablet [id number] Yes Take 40 mg by mouth  daily. [provider] Taking Active Self  potassium chloride SA (KLOR-CON M) 20 MEQ tablet [id number] Yes TAKE ONE TABLET BY MOUTH TWICE DAILY [name], Peter M, MD Taking Active   potassium chloride SA (KLOR-CON M) 20 MEQ tablet [id number] Yes Take by mouth. [provider] Taking Active   sertraline (ZOLOFT) 100 MG tablet [id number] Yes Take 1 tablet (100 mg total) by mouth daily. [name], MD Taking Active   vitamin A 8000 UNIT capsule [id number] Yes Take 8,000 Units by mouth daily. [provider] Taking Active Self  warfarin (COUMADIN) 2.5 MG tablet [id number] Yes Take 1/2 tablet to 1 tablet daily by mouth or as directed by Anticoagulation Clinic. [name], Peter M, MD Taking Active   Zinc Picolinate POWD [id number] Yes Take by mouth. [provider] Taking Active             SDOH:  (Social Determinants of Health) assessments and interventions performed: Yes SDOH Interventions    Flowsheet Row Care Coordination from 12/28/2022 in [place] Management from 05/17/2022 in [place] from 03/02/2022 in [place] Management from 11/16/2021 in [place] from 05/13/2020 in [facility] Office Visit from 05/08/2019 in [facility]  SDOH Interventions        Food Insecurity Interventions -- -- Intervention Not Indicated -- -- --  Housing Interventions Other (Comment)  [Sent msg to [employer]] -- Intervention Not Indicated -- -- --  Transportation Interventions -- -- Other (Comment)  [discussed [employer] transportation and gave her phone number] -- -- --  Depression Interventions/Treatment  -- -- -- -- PHQ2-9 Score <4 Follow-up Not Indicated Medication  Financial Strain Interventions Other (Comment)  [Sent msg to nursing] Intervention Not Indicated --  Intervention Not Indicated -- --  Physical Activity Interventions -- Patient Refused Other (Comments)  [physical therapy] -- -- --       Medication Assistance: None required.  Patient affirms current coverage meets needs.   Name and location of Current pharmacy:  [address], [address] [place] [address] Phone: [phone] Fax: [phone]  Compliance/Adherence/Medication fill history: Star Rating Drugs:  Medication:                Last Fill:         Day Supply Atorvastatin                 12/06/22-09/06/22 90ds Lisinopril                      10/05/22-07/07/22 90ds   Care Gaps: Annual wellness visit in last year? Yes, 03/02/22    Assessment/Plan  Hypertension (BP goal <140/90) BP Readings from Last 3 Encounters:  10/04/22 120/68  06/28/22 128/82  04/18/22 (!) 152/80  -Controlled -Current treatment: Diltiazem '120mg'$  Appropriate, Effective, Safe, Accessible Lisinopril '20mg'$  Appropriate, Effective, Safe, Accessible -Medications previously tried: N/A  -Current home readings: Doesn't test -Current dietary habits: "Tries to eat healthy" -Current exercise habits: None (Patient has to use a walker) -Denies hypotensive/hypertensive symptoms -Educated on BP goals and benefits of medications for prevention of heart attack, stroke and kidney damage; -Counseled to monitor BP at home PRN, document, and provide log at future appointments -Recommended to continue current medication   Hyperlipidemia: (LDL goal < 100) The ASCVD Risk score (Arnett DK, et al., 2019) failed to calculate for the following reasons:   The 2019 ASCVD risk score is only valid for ages 41 to 96 Lab Results  Component Value Date   CHOL 115 10/04/2022   CHOL 131 06/28/2022   CHOL 135 03/15/2022   Lab Results  Component Value Date   HDL 40 10/04/2022   HDL 46 06/28/2022   HDL 48 03/15/2022   Lab Results  Component Value Date   LDLCALC 54 10/04/2022   LDLCALC 63  06/28/2022   LDLCALC 63 03/15/2022   Lab Results  Component Value Date   TRIG 115 10/04/2022   TRIG 126 06/28/2022   TRIG 135 03/15/2022   Lab Results  Component Value Date   CHOLHDL 2.9 10/04/2022   CHOLHDL 2.8 06/28/2022   CHOLHDL 2.8 03/15/2022   No results found for: "LDLDIRECT" Last vitamin D Lab Results  Component Value Date   VD25OH 117.0 (H) 06/28/2022   Lab Results  Component Value Date   TSH 2.560 12/09/2021  -Controlled -Current treatment: Atorvastatin '80mg'$  Appropriate, Effective, Safe, Accessible -Medications previously tried: N/A  -Current dietary patterns: "Tries to eat healthy" -Current exercise habits: None -Educated on Benefits of statin for ASCVD risk reduction; -Recommended to continue current medication   Atrial Fibrillation (Goal: prevent stroke and major bleeding) -Controlled -CHADSVASC:              Jan 2023: 5 -Current treatment: Rate control:  Diltiazem '120mg'$  Appropriate, Effective, Safe, Accessible Lisinopril '20mg'$  QD Appropriate, Effective, Safe, Accessible Anticoagulation:  Warfarin UD Appropriate, Effective, Safe, Accessible -Medications previously tried: DOAC (Can't afford), Furosemide (Patient stopped due to ADR's) -Home BP and HR readings: Doesn't test  -Counseled on increased risk of  stroke due to Afib and benefits of anticoagulation for stroke prevention; Jan 2023: Will let PCP know patient Dc'd Furosemide   COPD (Goal: control symptoms and prevent exacerbations) -Controlled -Current treatment  None -Medications previously tried: Tiotropium  -MMRC/CAT score:      No data to display        -Exacerbations requiring treatment in last 6 months: None -Patient denies consistent use of maintenance inhaler -Frequency of rescue inhaler use: None -Counseled on Benefits of consistent maintenance inhaler use Jan 2023: Patient is adamant she doesn't need an inhaler and didn't wish to speak further about COPD    Depression/Anxiety -Controlled -Current treatment: Sertraline '100mg'$  Appropriate, Effective, Safe, Accessible -Medications previously tried/failed: None -PHQ9:     03/02/2022    8:09 PM 11/16/2021   10:54 AM 05/19/2021   10:39 AM  Depression screen PHQ 2/9  Decreased Interest 0 0 0  Down, Depressed, Hopeless 0 0 0  PHQ - 2 Score 0 0 0  -GAD7:      No data to display        -Educated on Benefits of medication for symptom control Jan 2023: Patient very very adamant she is not depressed and before I could start the PHQ9 she said, "I'm not depressed and I'm not down and I don't have depression" July 2023: Patient adamant she doesn't want to get a '25mg'$ +'50mg'$  and prefers to cut the '50mg'$  in half for 1.12 Dec 2022: Patient content on '100mg'$  dose now    Chronic Pain (Back) -Pain in back since her 30's -Controlled -Current treatment: Hydrocodone/APAP 10/'325mg'$  QID PRN Appropriate, Effective, Safe, Accessible -Medications previously tried: None  -Pain Scale There were no vitals filed for this visit.    Aggravating Factors: Movement  Pain Type: Chronic aching Jan 2023: Without Meds: 1-10/10, depends on the day With Meds: 0/16 May 2022: Without Meds: 7/10, depends on the day With Meds: didn't answer -Recommended to continue current medication, patient very content. Pain happens occasionally     Chronic Pain (Neuropathy) -Controlled -Current treatment: Gabapentin '600mg'$  HS Appropriate, Effective, Safe, Accessible -Medications previously tried: None  -Pain Scale Aggravating Factors: Movement  Pain Type: Chronic aching Jan 2023: Without Meds: 1-10/10, depends on the day With Meds: 0/16 May 2022: Without Meds: 7/10, depends on the day With Meds: didn't answer -Recommended to continue current medication   OAB (Goal: Decrease Urination) -Controlled -Current treatment  Oxybutynin '5mg'$  TID (Taking QD) Query Appropriate, Query effective,  -Medications previously tried: N/A   Jan 2023: Attempted to talk with patient about this disease state, frequency of urination etc. Patient changed topics. Brought up the idea that Oxybutynin IR TID could be changed for QD so it'll last all day and patient didn't wish to change therapy at this time July 2023: Unable to speak at length because patient didn't seem interested in speaking about any disease states Feb 2024: Main priority for today was housing  CPP F/U PRN  [place] Constable, Pharm.D. - [phone]

## 2023-01-01 ENCOUNTER — Emergency Department (HOSPITAL_COMMUNITY): Payer: Medicare Other

## 2023-01-01 ENCOUNTER — Other Ambulatory Visit: Payer: Self-pay

## 2023-01-01 ENCOUNTER — Inpatient Hospital Stay (HOSPITAL_COMMUNITY)
Admission: EM | Admit: 2023-01-01 | Discharge: 2023-01-08 | DRG: 558 | Disposition: A | Discharge status: Skilled Nursing Facility | Payer: Medicare Other | Attending: Family Medicine | Admitting: Family Medicine

## 2023-01-01 ENCOUNTER — Encounter (HOSPITAL_COMMUNITY): Payer: Self-pay | Admitting: Emergency Medicine

## 2023-01-01 DIAGNOSIS — M8580 Other specified disorders of bone density and structure, unspecified site: Secondary | ICD-10-CM | POA: Diagnosis present

## 2023-01-01 DIAGNOSIS — Z961 Presence of intraocular lens: Secondary | ICD-10-CM | POA: Diagnosis present

## 2023-01-01 DIAGNOSIS — I739 Peripheral vascular disease, unspecified: Secondary | ICD-10-CM | POA: Diagnosis not present

## 2023-01-01 DIAGNOSIS — J9811 Atelectasis: Secondary | ICD-10-CM | POA: Diagnosis present

## 2023-01-01 DIAGNOSIS — E875 Hyperkalemia: Secondary | ICD-10-CM

## 2023-01-01 DIAGNOSIS — Z7901 Long term (current) use of anticoagulants: Secondary | ICD-10-CM

## 2023-01-01 DIAGNOSIS — E7800 Pure hypercholesterolemia, unspecified: Secondary | ICD-10-CM | POA: Diagnosis not present

## 2023-01-01 DIAGNOSIS — I272 Pulmonary hypertension, unspecified: Secondary | ICD-10-CM | POA: Diagnosis not present

## 2023-01-01 DIAGNOSIS — M6282 Rhabdomyolysis: Secondary | ICD-10-CM | POA: Diagnosis not present

## 2023-01-01 DIAGNOSIS — I110 Hypertensive heart disease with heart failure: Secondary | ICD-10-CM | POA: Diagnosis present

## 2023-01-01 DIAGNOSIS — G629 Polyneuropathy, unspecified: Secondary | ICD-10-CM | POA: Diagnosis present

## 2023-01-01 DIAGNOSIS — R7401 Elevation of levels of liver transaminase levels: Secondary | ICD-10-CM

## 2023-01-01 DIAGNOSIS — E8809 Other disorders of plasma-protein metabolism, not elsewhere classified: Secondary | ICD-10-CM | POA: Diagnosis present

## 2023-01-01 DIAGNOSIS — M4854XA Collapsed vertebra, not elsewhere classified, thoracic region, initial encounter for fracture: Secondary | ICD-10-CM | POA: Diagnosis present

## 2023-01-01 DIAGNOSIS — W19XXXA Unspecified fall, initial encounter: Principal | ICD-10-CM

## 2023-01-01 DIAGNOSIS — E785 Hyperlipidemia, unspecified: Secondary | ICD-10-CM | POA: Insufficient documentation

## 2023-01-01 DIAGNOSIS — S199XXA Unspecified injury of neck, initial encounter: Secondary | ICD-10-CM | POA: Diagnosis not present

## 2023-01-01 DIAGNOSIS — R791 Abnormal coagulation profile: Secondary | ICD-10-CM

## 2023-01-01 DIAGNOSIS — L740 Miliaria rubra: Secondary | ICD-10-CM | POA: Diagnosis not present

## 2023-01-01 DIAGNOSIS — M1990 Unspecified osteoarthritis, unspecified site: Secondary | ICD-10-CM | POA: Diagnosis present

## 2023-01-01 DIAGNOSIS — J4489 Other specified chronic obstructive pulmonary disease: Secondary | ICD-10-CM | POA: Diagnosis not present

## 2023-01-01 DIAGNOSIS — I2510 Atherosclerotic heart disease of native coronary artery without angina pectoris: Secondary | ICD-10-CM

## 2023-01-01 DIAGNOSIS — Z8042 Family history of malignant neoplasm of prostate: Secondary | ICD-10-CM

## 2023-01-01 DIAGNOSIS — H3530 Unspecified macular degeneration: Secondary | ICD-10-CM | POA: Diagnosis not present

## 2023-01-01 DIAGNOSIS — S22030D Wedge compression fracture of third thoracic vertebra, subsequent encounter for fracture with routine healing: Secondary | ICD-10-CM | POA: Diagnosis not present

## 2023-01-01 DIAGNOSIS — M4850XA Collapsed vertebra, not elsewhere classified, site unspecified, initial encounter for fracture: Secondary | ICD-10-CM

## 2023-01-01 DIAGNOSIS — K760 Fatty (change of) liver, not elsewhere classified: Secondary | ICD-10-CM | POA: Diagnosis not present

## 2023-01-01 DIAGNOSIS — Y92000 Kitchen of unspecified non-institutional (private) residence as  the place of occurrence of the external cause: Secondary | ICD-10-CM

## 2023-01-01 DIAGNOSIS — D649 Anemia, unspecified: Secondary | ICD-10-CM | POA: Diagnosis not present

## 2023-01-01 DIAGNOSIS — M4856XA Collapsed vertebra, not elsewhere classified, lumbar region, initial encounter for fracture: Secondary | ICD-10-CM | POA: Diagnosis not present

## 2023-01-01 DIAGNOSIS — R296 Repeated falls: Secondary | ICD-10-CM

## 2023-01-01 DIAGNOSIS — M48061 Spinal stenosis, lumbar region without neurogenic claudication: Secondary | ICD-10-CM | POA: Diagnosis present

## 2023-01-01 DIAGNOSIS — R000 Tachycardia, unspecified: Secondary | ICD-10-CM | POA: Diagnosis not present

## 2023-01-01 DIAGNOSIS — Z1152 Encounter for screening for COVID-19: Secondary | ICD-10-CM | POA: Diagnosis not present

## 2023-01-01 DIAGNOSIS — K219 Gastro-esophageal reflux disease without esophagitis: Secondary | ICD-10-CM | POA: Diagnosis not present

## 2023-01-01 DIAGNOSIS — I4891 Unspecified atrial fibrillation: Secondary | ICD-10-CM | POA: Diagnosis not present

## 2023-01-01 DIAGNOSIS — I5032 Chronic diastolic (congestive) heart failure: Secondary | ICD-10-CM | POA: Diagnosis not present

## 2023-01-01 DIAGNOSIS — M8588 Other specified disorders of bone density and structure, other site: Secondary | ICD-10-CM | POA: Diagnosis not present

## 2023-01-01 DIAGNOSIS — I10 Essential (primary) hypertension: Secondary | ICD-10-CM | POA: Insufficient documentation

## 2023-01-01 DIAGNOSIS — S0990XA Unspecified injury of head, initial encounter: Secondary | ICD-10-CM | POA: Diagnosis not present

## 2023-01-01 DIAGNOSIS — S22080D Wedge compression fracture of T11-T12 vertebra, subsequent encounter for fracture with routine healing: Secondary | ICD-10-CM | POA: Diagnosis not present

## 2023-01-01 DIAGNOSIS — Z90710 Acquired absence of both cervix and uterus: Secondary | ICD-10-CM

## 2023-01-01 DIAGNOSIS — M6259 Muscle wasting and atrophy, not elsewhere classified, multiple sites: Secondary | ICD-10-CM | POA: Diagnosis not present

## 2023-01-01 DIAGNOSIS — S22020D Wedge compression fracture of second thoracic vertebra, subsequent encounter for fracture with routine healing: Secondary | ICD-10-CM | POA: Diagnosis not present

## 2023-01-01 DIAGNOSIS — M549 Dorsalgia, unspecified: Secondary | ICD-10-CM | POA: Diagnosis present

## 2023-01-01 DIAGNOSIS — W010XXA Fall on same level from slipping, tripping and stumbling without subsequent striking against object, initial encounter: Secondary | ICD-10-CM | POA: Diagnosis present

## 2023-01-01 DIAGNOSIS — R2681 Unsteadiness on feet: Secondary | ICD-10-CM | POA: Diagnosis not present

## 2023-01-01 DIAGNOSIS — S22080A Wedge compression fracture of T11-T12 vertebra, initial encounter for closed fracture: Secondary | ICD-10-CM

## 2023-01-01 DIAGNOSIS — Z79899 Other long term (current) drug therapy: Secondary | ICD-10-CM

## 2023-01-01 DIAGNOSIS — G8929 Other chronic pain: Secondary | ICD-10-CM | POA: Diagnosis present

## 2023-01-01 DIAGNOSIS — Z66 Do not resuscitate: Secondary | ICD-10-CM | POA: Diagnosis not present

## 2023-01-01 DIAGNOSIS — I4821 Permanent atrial fibrillation: Secondary | ICD-10-CM | POA: Insufficient documentation

## 2023-01-01 DIAGNOSIS — S22070D Wedge compression fracture of T9-T10 vertebra, subsequent encounter for fracture with routine healing: Secondary | ICD-10-CM | POA: Diagnosis not present

## 2023-01-01 DIAGNOSIS — E871 Hypo-osmolality and hyponatremia: Secondary | ICD-10-CM | POA: Diagnosis present

## 2023-01-01 DIAGNOSIS — R7303 Prediabetes: Secondary | ICD-10-CM | POA: Diagnosis present

## 2023-01-01 DIAGNOSIS — I081 Rheumatic disorders of both mitral and tricuspid valves: Secondary | ICD-10-CM | POA: Diagnosis present

## 2023-01-01 DIAGNOSIS — J449 Chronic obstructive pulmonary disease, unspecified: Secondary | ICD-10-CM | POA: Diagnosis not present

## 2023-01-01 DIAGNOSIS — I700 Atherosclerosis of aorta: Secondary | ICD-10-CM | POA: Diagnosis not present

## 2023-01-01 DIAGNOSIS — R29898 Other symptoms and signs involving the musculoskeletal system: Secondary | ICD-10-CM | POA: Diagnosis not present

## 2023-01-01 DIAGNOSIS — R54 Age-related physical debility: Secondary | ICD-10-CM | POA: Diagnosis present

## 2023-01-01 DIAGNOSIS — M545 Low back pain, unspecified: Secondary | ICD-10-CM | POA: Diagnosis not present

## 2023-01-01 DIAGNOSIS — S22040D Wedge compression fracture of fourth thoracic vertebra, subsequent encounter for fracture with routine healing: Secondary | ICD-10-CM | POA: Diagnosis not present

## 2023-01-01 DIAGNOSIS — Z602 Problems related to living alone: Secondary | ICD-10-CM | POA: Diagnosis present

## 2023-01-01 DIAGNOSIS — Z96643 Presence of artificial hip joint, bilateral: Secondary | ICD-10-CM | POA: Diagnosis present

## 2023-01-01 DIAGNOSIS — Z743 Need for continuous supervision: Secondary | ICD-10-CM | POA: Diagnosis not present

## 2023-01-01 DIAGNOSIS — S22060D Wedge compression fracture of T7-T8 vertebra, subsequent encounter for fracture with routine healing: Secondary | ICD-10-CM | POA: Diagnosis not present

## 2023-01-01 DIAGNOSIS — R262 Difficulty in walking, not elsewhere classified: Secondary | ICD-10-CM | POA: Diagnosis not present

## 2023-01-01 DIAGNOSIS — R918 Other nonspecific abnormal finding of lung field: Secondary | ICD-10-CM | POA: Diagnosis present

## 2023-01-01 DIAGNOSIS — F32A Depression, unspecified: Secondary | ICD-10-CM | POA: Diagnosis not present

## 2023-01-01 DIAGNOSIS — E538 Deficiency of other specified B group vitamins: Secondary | ICD-10-CM | POA: Diagnosis present

## 2023-01-01 DIAGNOSIS — Z8249 Family history of ischemic heart disease and other diseases of the circulatory system: Secondary | ICD-10-CM

## 2023-01-01 DIAGNOSIS — K449 Diaphragmatic hernia without obstruction or gangrene: Secondary | ICD-10-CM | POA: Diagnosis present

## 2023-01-01 DIAGNOSIS — R079 Chest pain, unspecified: Secondary | ICD-10-CM | POA: Diagnosis not present

## 2023-01-01 DIAGNOSIS — S32030A Wedge compression fracture of third lumbar vertebra, initial encounter for closed fracture: Secondary | ICD-10-CM | POA: Diagnosis not present

## 2023-01-01 DIAGNOSIS — Z880 Allergy status to penicillin: Secondary | ICD-10-CM

## 2023-01-01 DIAGNOSIS — M1712 Unilateral primary osteoarthritis, left knee: Secondary | ICD-10-CM | POA: Diagnosis not present

## 2023-01-01 DIAGNOSIS — Z7401 Bed confinement status: Secondary | ICD-10-CM | POA: Diagnosis not present

## 2023-01-01 DIAGNOSIS — Z882 Allergy status to sulfonamides status: Secondary | ICD-10-CM

## 2023-01-01 HISTORY — DX: Permanent atrial fibrillation: I48.21

## 2023-01-01 HISTORY — DX: Repeated falls: R29.6

## 2023-01-01 HISTORY — DX: Nonrheumatic mitral (valve) insufficiency: I34.0

## 2023-01-01 LAB — CBC WITH DIFFERENTIAL/PLATELET
Abs Immature Granulocytes: 0.04 10*3/uL (ref 0.00–0.07)
Basophils Absolute: 0 10*3/uL (ref 0.0–0.1)
Basophils Relative: 0 %
Eosinophils Absolute: 0 10*3/uL (ref 0.0–0.5)
Eosinophils Relative: 0 %
HCT: 44.8 % (ref 36.0–46.0)
Hemoglobin: 13.6 g/dL (ref 12.0–15.0)
Immature Granulocytes: 0 %
Lymphocytes Relative: 6 %
Lymphs Abs: 0.6 10*3/uL — ABNORMAL LOW (ref 0.7–4.0)
MCH: 26 pg (ref 26.0–34.0)
MCHC: 30.4 g/dL (ref 30.0–36.0)
MCV: 85.5 fL (ref 80.0–100.0)
Monocytes Absolute: 0.6 10*3/uL (ref 0.1–1.0)
Monocytes Relative: 6 %
Neutro Abs: 8.4 10*3/uL — ABNORMAL HIGH (ref 1.7–7.7)
Neutrophils Relative %: 88 %
Platelets: 378 10*3/uL (ref 150–400)
RBC: 5.24 MIL/uL — ABNORMAL HIGH (ref 3.87–5.11)
RDW: 17.7 % — ABNORMAL HIGH (ref 11.5–15.5)
WBC: 9.7 10*3/uL (ref 4.0–10.5)
nRBC: 0 % (ref 0.0–0.2)

## 2023-01-01 LAB — COMPREHENSIVE METABOLIC PANEL
ALT: 73 U/L — ABNORMAL HIGH (ref 0–44)
AST: 121 U/L — ABNORMAL HIGH (ref 15–41)
Albumin: 3.1 g/dL — ABNORMAL LOW (ref 3.5–5.0)
Alkaline Phosphatase: 100 U/L (ref 38–126)
Anion gap: 7 (ref 5–15)
BUN: 26 mg/dL — ABNORMAL HIGH (ref 8–23)
CO2: 25 mmol/L (ref 22–32)
Calcium: 9.9 mg/dL (ref 8.9–10.3)
Chloride: 103 mmol/L (ref 98–111)
Creatinine, Ser: 1.04 mg/dL — ABNORMAL HIGH (ref 0.44–1.00)
GFR, Estimated: 52 mL/min — ABNORMAL LOW (ref >60)
Glucose, Bld: 110 mg/dL — ABNORMAL HIGH (ref 70–99)
Potassium: 5.3 mmol/L — ABNORMAL HIGH (ref 3.5–5.1)
Sodium: 135 mmol/L (ref 135–145)
Total Bilirubin: 0.7 mg/dL (ref 0.3–1.2)
Total Protein: 8.2 g/dL — ABNORMAL HIGH (ref 6.5–8.1)

## 2023-01-01 LAB — URINALYSIS, ROUTINE W REFLEX MICROSCOPIC
Bilirubin Urine: NEGATIVE
Glucose, UA: NEGATIVE mg/dL
Hgb urine dipstick: NEGATIVE
Ketones, ur: NEGATIVE mg/dL
Leukocytes,Ua: NEGATIVE
Nitrite: NEGATIVE
Protein, ur: NEGATIVE mg/dL
Specific Gravity, Urine: 1.035 — ABNORMAL HIGH (ref 1.005–1.030)
pH: 5 (ref 5.0–8.0)

## 2023-01-01 LAB — CK: Total CK: 1993 U/L — ABNORMAL HIGH (ref 38–234)

## 2023-01-01 LAB — TROPONIN I (HIGH SENSITIVITY)
Troponin I (High Sensitivity): 18 ng/L — ABNORMAL HIGH (ref <18)
Troponin I (High Sensitivity): 17 ng/L (ref <18)

## 2023-01-01 LAB — PROTIME-INR
INR: 5.6 (ref 0.8–1.2)
Prothrombin Time: 50.4 seconds — ABNORMAL HIGH (ref 11.4–15.2)

## 2023-01-01 MED ORDER — IPRATROPIUM-ALBUTEROL 0.5-2.5 (3) MG/3ML IN SOLN: 3.0000 mL | Freq: Four times a day (QID) | RESPIRATORY_TRACT | Status: DC | PRN

## 2023-01-01 MED ORDER — SODIUM CHLORIDE 0.9 % IV SOLN: Freq: Once | INTRAVENOUS | Status: AC

## 2023-01-01 MED ORDER — SODIUM CHLORIDE 0.9 % IV SOLN: INTRAVENOUS | Status: AC

## 2023-01-01 MED ORDER — PANTOPRAZOLE SODIUM 40 MG PO TBEC
40.0000 mg | DELAYED_RELEASE_TABLET | Freq: Every day | ORAL | Status: DC
  Administered 2023-01-02 – 2023-01-08 (×7): 40 mg via ORAL
  Filled 2023-01-01 (×7): qty 1

## 2023-01-01 MED ORDER — NALOXONE HCL 0.4 MG/ML IJ SOLN: 0.4000 mg | INTRAMUSCULAR | Status: DC | PRN

## 2023-01-01 MED ORDER — OXYCODONE HCL 5 MG PO TABS
5.0000 mg | ORAL_TABLET | Freq: Four times a day (QID) | ORAL | Status: DC | PRN
  Filled 2023-01-01: qty 1

## 2023-01-01 MED ORDER — IOHEXOL 300 MG/ML  SOLN
100.0000 mL | Freq: Once | INTRAMUSCULAR | Status: AC | PRN
  Administered 2023-01-01: 100 mL via INTRAVENOUS

## 2023-01-01 MED ORDER — DILTIAZEM HCL ER COATED BEADS 120 MG PO CP24
120.0000 mg | ORAL_CAPSULE | Freq: Every day | ORAL | Status: DC
  Administered 2023-01-02 – 2023-01-08 (×7): 120 mg via ORAL
  Filled 2023-01-01 (×7): qty 1

## 2023-01-01 MED ORDER — SERTRALINE HCL 100 MG PO TABS
100.0000 mg | ORAL_TABLET | Freq: Every day | ORAL | Status: DC
  Administered 2023-01-02 – 2023-01-08 (×7): 100 mg via ORAL
  Filled 2023-01-01 (×10): qty 1

## 2023-01-01 MED ORDER — GABAPENTIN 300 MG PO CAPS
300.0000 mg | ORAL_CAPSULE | Freq: Two times a day (BID) | ORAL | Status: DC
  Administered 2023-01-01 – 2023-01-08 (×14): 300 mg via ORAL
  Filled 2023-01-01 (×14): qty 1

## 2023-01-01 NOTE — ED Notes (Signed)
ED TO INPATIENT HANDOFF REPORT  ED Nurse Name and Phone #: Alroy Bailiff Name/Age/Gender [name] 89 y.o. female Room/Bed: WA11/WA11  Code Status   Code Status: Prior  Home/SNF/Other Home Patient oriented to: self, place, time, and situation Is this baseline? Yes   Triage Complete: Triage complete  Chief Complaint Rhabdomyolysis [M62.82]  Triage Note Patient BIB EMS from home due to a fall last night. Patient has been crawling around on floor- states her legs are sore. Complains of lower back pain, hx of back surgery. Member is alert and oriented x 3, answers questions appropriately. Patients daughters insisted patient come to ER to be evaluated.    Allergies Allergies  Allergen Reactions   Sulfamethoxazole     Other reaction(s): Other (See Comments) Not listed   Sulfanilamide     unkn Other reaction(s): Other (See Comments) unkn   Sulfur Dioxide     Other reaction(s): Other (See Comments)   Penicillins Rash    Level of Care/Admitting Diagnosis ED Disposition     ED Disposition  Admit   Condition  --   Comment  Hospital Area: [place] [[id number]]  Level of Care: Telemetry [5]  Admit to tele based on following criteria: Complex arrhythmia (Bradycardia/Tachycardia)  May place patient in observation at [facility] or [place] if equivalent level of care is available:: Yes  Covid Evaluation: Asymptomatic - no recent exposure (last 10 days) testing not required  Diagnosis: Rhabdomyolysis [728.88.ICD-9-CM]  Admitting Physician: Shela Leff [id number]  Attending Physician: Shela Leff [id number]          B Medical/Surgery History Past Medical History:  Diagnosis Date   Arthritis    Back pain, chronic    Coronary artery disease CARDIOLOGIST- DR [name]--  LOV IN EPIC   Depression    Hammer toe    Hypercholesterolemia    Hypertensive heart disease    WITH LVH   LVH (left ventricular hypertrophy) due to  hypertensive disease    Mitral insufficiency    CHRONIC   Pulmonary hypertension ([place])    Transfusion history    Past Surgical History:  Procedure Laterality Date   ABDOMINAL AORTOGRAM W/LOWER EXTREMITY N/A 04/29/2020   Procedure: ABDOMINAL AORTOGRAM W/LOWER EXTREMITY;  Surgeon: [name], MD;  Location: [facility];  Service: Cardiovascular;  Laterality: N/A;  Lt Leg   ABDOMINAL HYSTERECTOMY     ANTERIOR REPAIR/ [place] PUBOVAGINAL SLING  05-03-2002   BUNIONECTOMY WITH HAMMERTOE RECONSTRUCTION Right 03/01/2013   Procedure: RIGHT FOOT EXCISION BUNIONETTE AND FUSION OF DIP FOURTH TOE;  Surgeon: [name], MD;  Location: [place];  Service: Orthopedics;  Laterality: Right;   CARDIAC CATHETERIZATION  02-03-2009  DR  [name]   SINGLE-VESSEL OBSTRUCTIVE CAD, SMALL DIAGONAL BRANCH/ NORMAL LVF/ SEVERE MITRAL INSUFFICIENCY/ SEVERE PULMONARY HYPERTENSION   CATARACT EXTRACTION W/ INTRAOCULAR LENS  IMPLANT, BILATERAL  2003   COLONOSCOPY  07/25/2014   few mall scattered diverticula in the ectire examined colon. Small internal hemmorhoids.    HAMMER TOE SURGERY  02/28/2012   Procedure: HAMMER TOE CORRECTION;  Surgeon: [name], MD;  Location: [facility];  Service: Orthopedics;  Laterality: Right;  FUSION OF PROXIMAL INTERPHALANGEAL  RIGHT THIRD CLAW TOE   PARS PLANA VITRECTOMY W/ REPAIR OF MACULAR HOLE  02-04-2002   LEFT EYE   PERIPHERAL VASCULAR BALLOON ANGIOPLASTY  04/29/2020   Procedure: PERIPHERAL VASCULAR BALLOON ANGIOPLASTY;  Surgeon: [name], MD;  Location: [facility];  Service: Cardiovascular;;  Lt. AT   REPAIR RIGHT SECOND CLAW TOE/ VARUS MEDIAL CLOSING WEDGE OSTEOTOMY PROXIMAL PHALANX RIGHT GREAT TOE  05-05-2005   TOTAL HIP ARTHROPLASTY     BILATERAL---  LEFT 4/98;   RIGHT  11/98   VARUS OSTEOTOMY PROXIMAL PHALANX, LEFT GREAT TOE/ PARING DOWN THE CALLUS , SECOND LEFT TOE  10-27-2008     A IV  Location/Drains/Wounds Patient Lines/Drains/Airways Status     Active Line/Drains/Airways     Name Placement date Placement time Site Days   Peripheral IV 01/01/23 20 G Anterior;Proximal;Right Forearm 01/01/23  1500  Forearm  less than 1   Incision 02/28/12 Toe (Comment  which one) Right 02/28/12  1317  -- 3960   Incision 03/01/13 Foot Right 03/01/13  1422  -- 3593            Intake/Output Last 24 hours No intake or output data in the 24 hours ending 01/01/23 2233  Labs/Imaging Results for orders placed or performed during the hospital encounter of 01/01/23 (from the past 48 hour(s))  CBC with Differential     Status: Abnormal   Collection Time: 01/01/23  3:05 PM  Result Value Ref Range   WBC 9.7 4.0 - 10.5 K/uL   RBC 5.24 (H) 3.87 - 5.11 MIL/uL   Hemoglobin 13.6 12.0 - 15.0 g/dL   HCT 44.8 36.0 - 46.0 %   MCV 85.5 80.0 - 100.0 fL   MCH 26.0 26.0 - 34.0 pg   MCHC 30.4 30.0 - 36.0 g/dL   RDW 17.7 (H) 11.5 - 15.5 %   Platelets 378 150 - 400 K/uL   nRBC 0.0 0.0 - 0.2 %   Neutrophils Relative % 88 %   Neutro Abs 8.4 (H) 1.7 - 7.7 K/uL   Lymphocytes Relative 6 %   Lymphs Abs 0.6 (L) 0.7 - 4.0 K/uL   Monocytes Relative 6 %   Monocytes Absolute 0.6 0.1 - 1.0 K/uL   Eosinophils Relative 0 %   Eosinophils Absolute 0.0 0.0 - 0.5 K/uL   Basophils Relative 0 %   Basophils Absolute 0.0 0.0 - 0.1 K/uL   Immature Granulocytes 0 %   Abs Immature Granulocytes 0.04 0.00 - 0.07 K/uL    Comment: Performed at [facility], [place] [address] [place], [place] [address]  Comprehensive metabolic panel     Status: Abnormal   Collection Time: 01/01/23  3:05 PM  Result Value Ref Range   Sodium 135 135 - 145 mmol/L   Potassium 5.3 (H) 3.5 - 5.1 mmol/L   Chloride 103 98 - 111 mmol/L   CO2 25 22 - 32 mmol/L   Glucose, Bld 110 (H) 70 - 99 mg/dL    Comment: Glucose reference range applies only to samples taken after fasting for at least 8 hours.   BUN 26 (H) 8 - 23 mg/dL    Creatinine, Ser 1.04 (H) 0.44 - 1.00 mg/dL   Calcium 9.9 8.9 - 10.3 mg/dL   Total Protein 8.2 (H) 6.5 - 8.1 g/dL   Albumin 3.1 (L) 3.5 - 5.0 g/dL   AST 121 (H) 15 - 41 U/L   ALT 73 (H) 0 - 44 U/L   Alkaline Phosphatase 100 38 - 126 U/L   Total Bilirubin 0.7 0.3 - 1.2 mg/dL   GFR, Estimated 52 (L) >60 mL/min    Comment: (NOTE) Calculated using the CKD-EPI Creatinine Equation (2021)    Anion gap 7 5 - 15    Comment:  Performed at [facility], [place] [address], [place], [place] [address]  Troponin I (High Sensitivity)     Status: Abnormal   Collection Time: 01/01/23  3:05 PM  Result Value Ref Range   Troponin I (High Sensitivity) 18 (H) <18 ng/L    Comment: (NOTE) Elevated high sensitivity troponin I (hsTnI) values and significant  changes across serial measurements may suggest ACS but many other  chronic and acute conditions are known to elevate hsTnI results.  Refer to the "Links" section for chest pain algorithms and additional  guidance. Performed at [facility], [place] [address], [place], [place] [address]   CK     Status: Abnormal   Collection Time: 01/01/23  3:05 PM  Result Value Ref Range   Total CK 1,993 (H) 38 - 234 U/L    Comment: Performed at [facility], [place] [address] [place], [place] [address]  Protime-INR     Status: Abnormal   Collection Time: 01/01/23  4:48 PM  Result Value Ref Range   Prothrombin Time 50.4 (H) 11.4 - 15.2 seconds   INR 5.6 (HH) 0.8 - 1.2    Comment: CRITICAL RESULT CALLED TO, READ BACK BY AND VERIFIED WITH: COLES,L RN AT [id number] 01/01/23 BY TIBBITTS, K (NOTE) INR goal varies based on device and disease states. Performed at [facility], [place] [address], [place], [place] [address]   Troponin I (High Sensitivity)     Status: None   Collection Time: 01/01/23  5:05 PM  Result Value Ref Range   Troponin I (High Sensitivity) 17 <18 ng/L    Comment: (NOTE) Elevated high  sensitivity troponin I (hsTnI) values and significant  changes across serial measurements may suggest ACS but many other  chronic and acute conditions are known to elevate hsTnI results.  Refer to the "Links" section for chest pain algorithms and additional  guidance. Performed at [facility], [place] [address], [place], [place] [address]    CT Thoracic Spine Wo Contrast  Result Date: 01/01/2023 CLINICAL DATA:  Fall injury with back trauma.  Barium EXAM: CT  THORACIC AND LUMBAR SPINE WITHOUT CONTRAST TECHNIQUE: Multidetector CT imaging of the thoracic and lumbar spine was performed without intravenous contrast. Multiplanar CT image reconstructions were also generated. RADIATION DOSE REDUCTION: This exam was performed according to the departmental dose-optimization program which includes automated exposure control, adjustment of the mA and/or kV according to patient size and/or use of iterative reconstruction technique. COMPARISON:  MRI thoracic spine 02/03/22, CT lumbar spine 02/01/2022. FINDINGS: CT THORACIC SPINE FINDINGS Segmentation: There are 12 rib-bearing thoracic type segments. Alignment: Minimal to mild upper to midthoracic kyphodextroscoliosis is unchanged. Trace degenerative anterolisthesis is again noted at T2-3 and T4-5 and up to 3 mm posterosuperior retropulsion noted at T11 and 12 due to a chronic T11 and acute T12 compression fractures. Vertebrae: Generalized osteopenia. There is an acute generalized compression fracture of the T12 vertebral body with intact pedicles and posterior elements and approximately 30% generalized vertebral height loss with 3 mm posterosuperior retropulsion. At T10 and T11 there are chronic compression fractures which were previously acute, with approximately 30% and 50% maximum vertebral height loss, respectively with no increase in compression since the prior study with interval kyphoplasty at both levels. Again noted and unchanged are mild  chronic wedge compression fractures of T1, T2 and T5 and mild-to-moderate chronic compression fractures of T3 and T4. Paraspinal and other soft tissues: There is dorsal paraspinous muscular atrophy  which was seen previously. There is mucoid material and debris newly noted filling and obstructing the right lower lobe main and segmental bronchi with right lower lobe collapse. There are trace pleural effusions. Calcifications are partially visible in the thoracic aorta, coronary arteries and mitral ring. Moderate-sized hiatal hernia. Disc levels: The thoracic discs show multilevel degenerative disc height loss, spondylosis and facet spurring. There is no sizable disc herniation above T12 or cord compromise. Posterosuperior retropulsion at T11 and T12 flattens the ventral thecal sac without causing noteworthy spinal stenosis. At T12-L1, again noted is a left paracentral calcified disc extrusion extending cephalad in the epidural space posterior to the T12 body and partially effacing the ventral CSF. This however, was seen previously with no mass effect on the conus. The foramina are again moderately stenotic at T8-9, T9-10, T10-11, and T11-12. No other foraminal compromise. CT LUMBAR SPINE FINDINGS Segmentation: 5 lumbar type vertebrae. Alignment: There is slight dextroscoliosis. Alignment is physiologic except for a 4 mm posterosuperior retropulsion of the L3 vertebral body cortex. Vertebrae: Osteopenia. There previously was a 25% upper plate anterior wedge compression fracture deformity of L3 which has worsened to approximately 50% loss of the anterior and central vertebral body height and 30% loss of the posterior vertebral body height since the prior study. There are intact anterior superior osteophytes of the L3 body suggesting a chronic or at least subacute process. There is no pedicle or posterior element fracture. The other lumbar vertebrae are normal in heights. No primary pathologic bone lesion is seen. The  visualized sacrum and SI joints are intact, with SI joint spurring. Paraspinal and other soft tissues: Chronic fatty atrophy again noted in the dorsal paraspinous and bilateral iliopsoas musculature. Chronic scarring and cortical volume loss inferior pole right kidney. Aortic atherosclerosis without AAA. No mass or paraspinal hematoma. Disc levels: T12-L1: Disc degeneration and vacuum phenomenon with disc calcifications and a calcified left paracentral disc extrusion with cranial extension described above. Clear foramina. L1-2: Mild diffuse disc bulge without herniation or stenosis. Normal disc height. Clear foramina. Trace facet spurring. L2-3: Slight disc space loss. Posterosuperior L3 retropulsion causing moderate spinal canal stenosis along with hypertrophic ligamentous and facet disease. There is foraminal disc bulging, mild bilateral foraminal stenosis. L3-4: Normal disc height. Diffuse disc bulge, dorsal ligamentous and facet hypertrophy causing mild to moderate spinal canal stenosis. Foraminal disc bulging with mild foraminal stenosis. L4-5: Chronic disc collapse, vacuum phenomenon and circumferential disc osteophyte complex, along with dorsal ligamentous and facet hypertrophy causing moderate acquired spinal canal stenosis. There is foraminal disc bulging and mild-to-moderate foraminal stenosis on the right. L5-S1: Chronic degenerative disc collapse and circumferential disc osteophyte complex. No herniation or canal stenosis. Facet hypertrophy with moderate to severe foraminal stenosis. IMPRESSION: 1. Acute generalized compression fracture of the T12 vertebral body with 30% height loss and 3 mm posterosuperior retropulsion. 2. Chronic compression fractures of T10 and T11 with interval kyphoplasty. 3. Chronic compression fractures of T1, T2, T3, T4. 4. Osteopenia and degenerative change thoracic and lumbar spine as detailed above. 5. Chronic T12-L1 left paracentral calcified disc extrusion with cranial  extension. 6. Right lower lobe collapse with mucoid material and debris obstructing the right lower lobe main and segmental bronchi. 7. Trace pleural effusions. 8. Hiatal hernia. 9. Aortic and coronary artery atherosclerosis. 10. Since 02/01/2022 CT, increased compression fracture of L3, now with 50% loss of the anterior and central vertebral body height and 30% loss of the posterior vertebral body height with Posterosuperior L3 retropulsion causing moderate  spinal canal stenosis. Possible this could be acute on chronic injury, but I suspect it is probably subacute or chronic. Aortic Atherosclerosis (ICD10-I70.0). Electronically Signed   By: Telford Nab M.D.   On: 01/01/2023 20:44   CT Lumbar Spine Wo Contrast  Result Date: 01/01/2023 CLINICAL DATA:  Fall injury with back trauma.  Barium EXAM: CT  THORACIC AND LUMBAR SPINE WITHOUT CONTRAST TECHNIQUE: Multidetector CT imaging of the thoracic and lumbar spine was performed without intravenous contrast. Multiplanar CT image reconstructions were also generated. RADIATION DOSE REDUCTION: This exam was performed according to the departmental dose-optimization program which includes automated exposure control, adjustment of the mA and/or kV according to patient size and/or use of iterative reconstruction technique. COMPARISON:  MRI thoracic spine 02/03/22, CT lumbar spine 02/01/2022. FINDINGS: CT THORACIC SPINE FINDINGS Segmentation: There are 12 rib-bearing thoracic type segments. Alignment: Minimal to mild upper to midthoracic kyphodextroscoliosis is unchanged. Trace degenerative anterolisthesis is again noted at T2-3 and T4-5 and up to 3 mm posterosuperior retropulsion noted at T11 and 12 due to a chronic T11 and acute T12 compression fractures. Vertebrae: Generalized osteopenia. There is an acute generalized compression fracture of the T12 vertebral body with intact pedicles and posterior elements and approximately 30% generalized vertebral height loss with 3 mm  posterosuperior retropulsion. At T10 and T11 there are chronic compression fractures which were previously acute, with approximately 30% and 50% maximum vertebral height loss, respectively with no increase in compression since the prior study with interval kyphoplasty at both levels. Again noted and unchanged are mild chronic wedge compression fractures of T1, T2 and T5 and mild-to-moderate chronic compression fractures of T3 and T4. Paraspinal and other soft tissues: There is dorsal paraspinous muscular atrophy which was seen previously. There is mucoid material and debris newly noted filling and obstructing the right lower lobe main and segmental bronchi with right lower lobe collapse. There are trace pleural effusions. Calcifications are partially visible in the thoracic aorta, coronary arteries and mitral ring. Moderate-sized hiatal hernia. Disc levels: The thoracic discs show multilevel degenerative disc height loss, spondylosis and facet spurring. There is no sizable disc herniation above T12 or cord compromise. Posterosuperior retropulsion at T11 and T12 flattens the ventral thecal sac without causing noteworthy spinal stenosis. At T12-L1, again noted is a left paracentral calcified disc extrusion extending cephalad in the epidural space posterior to the T12 body and partially effacing the ventral CSF. This however, was seen previously with no mass effect on the conus. The foramina are again moderately stenotic at T8-9, T9-10, T10-11, and T11-12. No other foraminal compromise. CT LUMBAR SPINE FINDINGS Segmentation: 5 lumbar type vertebrae. Alignment: There is slight dextroscoliosis. Alignment is physiologic except for a 4 mm posterosuperior retropulsion of the L3 vertebral body cortex. Vertebrae: Osteopenia. There previously was a 25% upper plate anterior wedge compression fracture deformity of L3 which has worsened to approximately 50% loss of the anterior and central vertebral body height and 30% loss of  the posterior vertebral body height since the prior study. There are intact anterior superior osteophytes of the L3 body suggesting a chronic or at least subacute process. There is no pedicle or posterior element fracture. The other lumbar vertebrae are normal in heights. No primary pathologic bone lesion is seen. The visualized sacrum and SI joints are intact, with SI joint spurring. Paraspinal and other soft tissues: Chronic fatty atrophy again noted in the dorsal paraspinous and bilateral iliopsoas musculature. Chronic scarring and cortical volume loss inferior pole right kidney. Aortic atherosclerosis  without AAA. No mass or paraspinal hematoma. Disc levels: T12-L1: Disc degeneration and vacuum phenomenon with disc calcifications and a calcified left paracentral disc extrusion with cranial extension described above. Clear foramina. L1-2: Mild diffuse disc bulge without herniation or stenosis. Normal disc height. Clear foramina. Trace facet spurring. L2-3: Slight disc space loss. Posterosuperior L3 retropulsion causing moderate spinal canal stenosis along with hypertrophic ligamentous and facet disease. There is foraminal disc bulging, mild bilateral foraminal stenosis. L3-4: Normal disc height. Diffuse disc bulge, dorsal ligamentous and facet hypertrophy causing mild to moderate spinal canal stenosis. Foraminal disc bulging with mild foraminal stenosis. L4-5: Chronic disc collapse, vacuum phenomenon and circumferential disc osteophyte complex, along with dorsal ligamentous and facet hypertrophy causing moderate acquired spinal canal stenosis. There is foraminal disc bulging and mild-to-moderate foraminal stenosis on the right. L5-S1: Chronic degenerative disc collapse and circumferential disc osteophyte complex. No herniation or canal stenosis. Facet hypertrophy with moderate to severe foraminal stenosis. IMPRESSION: 1. Acute generalized compression fracture of the T12 vertebral body with 30% height loss and 3  mm posterosuperior retropulsion. 2. Chronic compression fractures of T10 and T11 with interval kyphoplasty. 3. Chronic compression fractures of T1, T2, T3, T4. 4. Osteopenia and degenerative change thoracic and lumbar spine as detailed above. 5. Chronic T12-L1 left paracentral calcified disc extrusion with cranial extension. 6. Right lower lobe collapse with mucoid material and debris obstructing the right lower lobe main and segmental bronchi. 7. Trace pleural effusions. 8. Hiatal hernia. 9. Aortic and coronary artery atherosclerosis. 10. Since 02/01/2022 CT, increased compression fracture of L3, now with 50% loss of the anterior and central vertebral body height and 30% loss of the posterior vertebral body height with Posterosuperior L3 retropulsion causing moderate spinal canal stenosis. Possible this could be acute on chronic injury, but I suspect it is probably subacute or chronic. Aortic Atherosclerosis (ICD10-I70.0). Electronically Signed   By: Telford Nab M.D.   On: 01/01/2023 20:44   CT Chest W Contrast  Result Date: 01/01/2023 CLINICAL DATA:  89 year old post trauma. EXAM: CT CHEST, ABDOMEN, AND PELVIS WITH CONTRAST TECHNIQUE: Multidetector CT imaging of the chest, abdomen and pelvis was performed following the standard protocol during bolus administration of intravenous contrast. RADIATION DOSE REDUCTION: This exam was performed according to the departmental dose-optimization program which includes automated exposure control, adjustment of the mA and/or kV according to patient size and/or use of iterative reconstruction technique. CONTRAST:  163m OMNIPAQUE IOHEXOL 300 MG/ML  SOLN COMPARISON:  Radiographs earlier today. Chest CT 03/25/2010 FINDINGS: CT CHEST FINDINGS Cardiovascular: No evidence of acute aortic or vascular injury. Aortic atherosclerosis. Aortic valvular and mitral annulus calcifications. The heart is normal in size. There is no pericardial effusion. Mediastinum/Nodes: Scattered  small mediastinal lymph nodes are not enlarged by size criteria. Prominent right hilar node is likely reactive. Moderate-sized hiatal hernia no pneumomediastinum. Lungs/Pleura: No pneumothorax. Rounded density in the right bronchus intermedius with complete low-density bronchial filling of the right lower lobe bronchi and complete right lower lobe collapse. There is a 2.9 cm fluid density structure within the area of atelectatic lung in the right lower lobe series 2, image 33. Subpleural reticulation and ground-glass in a peripheral and basilar predominant distribution. No significant pleural effusion. Musculoskeletal: Thoracic spine assessed on concurrent thoracic spine reformats, reported separately. No acute fracture of the ribs, sternum or included clavicles and shoulder girdles. CT ABDOMEN PELVIS FINDINGS Hepatobiliary: No hepatic injury or perihepatic hematoma. Mild hepatic steatosis. Gallbladder is unremarkable. Pancreas: No evidence of injury. No ductal dilatation  or inflammation. Spleen: No splenic injury or perisplenic hematoma. Adrenals/Urinary Tract: No adrenal hemorrhage or renal injury identified. Atrophy and cortical scarring in the lower pole of the right kidney may represent sequela of prior renal infarct. Bladder is unremarkable. Stomach/Bowel: There is no evidence of bowel injury or mesenteric hematoma. No bowel inflammation or obstruction. Moderate-sized hiatal hernia. Vascular/Lymphatic: No evidence of vascular injury. Prominent aortic atherosclerosis without aneurysm. No retroperitoneal fluid. No gross adenopathy. Reproductive: Status post hysterectomy. No adnexal masses. Other: No free air or free fluid. Musculoskeletal: Lumbar spine assessed on concurrent lumbar spine reformats, reported separately. Bilateral hip arthroplasties. There is no evidence of pelvic fracture. IMPRESSION: 1. No evidence of acute traumatic injury to the chest, abdomen, or pelvis. 2. Reference thoracic and lumbar spine  reformats for spinal evaluation. 3. Rounded density in the right bronchus intermedius with complete low-density bronchial filling of the right lower lobe bronchi and complete right lower lobe collapse. Acuity is uncertain but favored to be chronic. There is a 2.9 cm fluid density structure within the area of atelectatic lung in the right lower lobe. This is nonspecific. Both neoplastic or infectious etiologies are considered. Recommend bronchoscopy for further evaluation. 4. Subpleural reticulation and ground-glass in a peripheral and basilar predominant distribution, suspicious for interstitial lung disease. 5. Incidental findings in the abdomen and pelvis of hepatic steatosis and right renal scarring. Moderate-sized hiatal hernia. Aortic Atherosclerosis (ICD10-I70.0). Electronically Signed   By: Keith Rake M.D.   On: 01/01/2023 20:05   CT ABDOMEN PELVIS W CONTRAST  Result Date: 01/01/2023 CLINICAL DATA:  89 year old post trauma. EXAM: CT CHEST, ABDOMEN, AND PELVIS WITH CONTRAST TECHNIQUE: Multidetector CT imaging of the chest, abdomen and pelvis was performed following the standard protocol during bolus administration of intravenous contrast. RADIATION DOSE REDUCTION: This exam was performed according to the departmental dose-optimization program which includes automated exposure control, adjustment of the mA and/or kV according to patient size and/or use of iterative reconstruction technique. CONTRAST:  163m OMNIPAQUE IOHEXOL 300 MG/ML  SOLN COMPARISON:  Radiographs earlier today. Chest CT 03/25/2010 FINDINGS: CT CHEST FINDINGS Cardiovascular: No evidence of acute aortic or vascular injury. Aortic atherosclerosis. Aortic valvular and mitral annulus calcifications. The heart is normal in size. There is no pericardial effusion. Mediastinum/Nodes: Scattered small mediastinal lymph nodes are not enlarged by size criteria. Prominent right hilar node is likely reactive. Moderate-sized hiatal hernia no  pneumomediastinum. Lungs/Pleura: No pneumothorax. Rounded density in the right bronchus intermedius with complete low-density bronchial filling of the right lower lobe bronchi and complete right lower lobe collapse. There is a 2.9 cm fluid density structure within the area of atelectatic lung in the right lower lobe series 2, image 33. Subpleural reticulation and ground-glass in a peripheral and basilar predominant distribution. No significant pleural effusion. Musculoskeletal: Thoracic spine assessed on concurrent thoracic spine reformats, reported separately. No acute fracture of the ribs, sternum or included clavicles and shoulder girdles. CT ABDOMEN PELVIS FINDINGS Hepatobiliary: No hepatic injury or perihepatic hematoma. Mild hepatic steatosis. Gallbladder is unremarkable. Pancreas: No evidence of injury. No ductal dilatation or inflammation. Spleen: No splenic injury or perisplenic hematoma. Adrenals/Urinary Tract: No adrenal hemorrhage or renal injury identified. Atrophy and cortical scarring in the lower pole of the right kidney may represent sequela of prior renal infarct. Bladder is unremarkable. Stomach/Bowel: There is no evidence of bowel injury or mesenteric hematoma. No bowel inflammation or obstruction. Moderate-sized hiatal hernia. Vascular/Lymphatic: No evidence of vascular injury. Prominent aortic atherosclerosis without aneurysm. No retroperitoneal fluid. No gross adenopathy.  Reproductive: Status post hysterectomy. No adnexal masses. Other: No free air or free fluid. Musculoskeletal: Lumbar spine assessed on concurrent lumbar spine reformats, reported separately. Bilateral hip arthroplasties. There is no evidence of pelvic fracture. IMPRESSION: 1. No evidence of acute traumatic injury to the chest, abdomen, or pelvis. 2. Reference thoracic and lumbar spine reformats for spinal evaluation. 3. Rounded density in the right bronchus intermedius with complete low-density bronchial filling of the right  lower lobe bronchi and complete right lower lobe collapse. Acuity is uncertain but favored to be chronic. There is a 2.9 cm fluid density structure within the area of atelectatic lung in the right lower lobe. This is nonspecific. Both neoplastic or infectious etiologies are considered. Recommend bronchoscopy for further evaluation. 4. Subpleural reticulation and ground-glass in a peripheral and basilar predominant distribution, suspicious for interstitial lung disease. 5. Incidental findings in the abdomen and pelvis of hepatic steatosis and right renal scarring. Moderate-sized hiatal hernia. Aortic Atherosclerosis (ICD10-I70.0). Electronically Signed   By: Keith Rake M.D.   On: 01/01/2023 20:05   CT Head Wo Contrast  Result Date: 01/01/2023 CLINICAL DATA:  Head trauma, minor (Age >= 65y); Neck trauma (Age >= 65y) EXAM: CT HEAD WITHOUT CONTRAST CT CERVICAL SPINE WITHOUT CONTRAST TECHNIQUE: Multidetector CT imaging of the head and cervical spine was performed following the standard protocol without intravenous contrast. Multiplanar CT image reconstructions of the cervical spine were also generated. RADIATION DOSE REDUCTION: This exam was performed according to the departmental dose-optimization program which includes automated exposure control, adjustment of the mA and/or kV according to patient size and/or use of iterative reconstruction technique. COMPARISON:  02/01/2022 FINDINGS: CT HEAD FINDINGS Brain: No evidence of acute infarction, hemorrhage, hydrocephalus, extra-axial collection or mass lesion/mass effect. Extensive low-density changes within the periventricular and subcortical white matter most compatible with chronic microvascular ischemic change. Moderate diffuse cerebral volume loss. Vascular: Atherosclerotic calcifications involving the large vessels of the skull base. No unexpected hyperdense vessel. Skull: Normal. Negative for fracture or focal lesion. Sinuses/Orbits: No acute finding.  Other: None. CT CERVICAL SPINE FINDINGS Alignment: Facet joints are aligned without dislocation or traumatic listhesis. Dens and lateral masses are aligned. Skull base and vertebrae: No acute fracture. No primary bone lesion or focal pathologic process. Soft tissues and spinal canal: No prevertebral fluid or swelling. No visible canal hematoma. Disc levels: Advanced facet-predominant multilevel cervical spondylosis. Upper chest: Negative. Other: Bilateral carotid atherosclerosis. IMPRESSION: 1. No acute intracranial abnormality. 2. No acute fracture or subluxation of the cervical spine. Electronically Signed   By: Davina Poke D.O.   On: 01/01/2023 16:27   CT Cervical Spine Wo Contrast  Result Date: 01/01/2023 CLINICAL DATA:  Head trauma, minor (Age >= 65y); Neck trauma (Age >= 65y) EXAM: CT HEAD WITHOUT CONTRAST CT CERVICAL SPINE WITHOUT CONTRAST TECHNIQUE: Multidetector CT imaging of the head and cervical spine was performed following the standard protocol without intravenous contrast. Multiplanar CT image reconstructions of the cervical spine were also generated. RADIATION DOSE REDUCTION: This exam was performed according to the departmental dose-optimization program which includes automated exposure control, adjustment of the mA and/or kV according to patient size and/or use of iterative reconstruction technique. COMPARISON:  02/01/2022 FINDINGS: CT HEAD FINDINGS Brain: No evidence of acute infarction, hemorrhage, hydrocephalus, extra-axial collection or mass lesion/mass effect. Extensive low-density changes within the periventricular and subcortical white matter most compatible with chronic microvascular ischemic change. Moderate diffuse cerebral volume loss. Vascular: Atherosclerotic calcifications involving the large vessels of the skull base. No unexpected hyperdense vessel.  Skull: Normal. Negative for fracture or focal lesion. Sinuses/Orbits: No acute finding. Other: None. CT CERVICAL SPINE FINDINGS  Alignment: Facet joints are aligned without dislocation or traumatic listhesis. Dens and lateral masses are aligned. Skull base and vertebrae: No acute fracture. No primary bone lesion or focal pathologic process. Soft tissues and spinal canal: No prevertebral fluid or swelling. No visible canal hematoma. Disc levels: Advanced facet-predominant multilevel cervical spondylosis. Upper chest: Negative. Other: Bilateral carotid atherosclerosis. IMPRESSION: 1. No acute intracranial abnormality. 2. No acute fracture or subluxation of the cervical spine. Electronically Signed   By: Davina Poke D.O.   On: 01/01/2023 16:27   DG Lumbar Spine Complete  Result Date: 01/01/2023 CLINICAL DATA:  Fall with back pain. EXAM: LUMBAR SPINE - COMPLETE 4+ VIEW COMPARISON:  CT of the lumbar spine peripheral armed on March 06/26/2022 FINDINGS: Osteopenia.  Spinal degenerative changes. Signs of cement augmentation in the lower thoracic spine. Five lumbar type vertebral bodies. Similar appearance of slight increased loss of height suspected also at L1 since imaging from February 01, 2022. Marked narrowing of the disc space at L4-5 and L5-S1 with facet hypertrophy at these levels as well. Signs of interval at least 40% loss of height of the T12 vertebral body. Subtle lucency across the posterior cortex and mild retropulsion suspected. Difficult to exclude pedicle involvement. No gross change in alignment. T10 and T11 cement augmentation since prior imaging. IMPRESSION: 1. Signs of interval at least 40% loss of height of the T12 vertebral body. Subtle lucency across the posterior cortex and mild retropulsion suspected. Difficult to exclude pedicle involvement. CT may be helpful for further evaluation. 2. Suspect slight interval loss of height also at L3 where there was of compression fracture seen on previous image. Unclear whether this is acute or represents changes related to healing and interval change since February 01, 2022. Correlate  with any signs of point tenderness in this location. 3. Signs of cement augmentation of vertebral bodies in the lower thoracic spine at T10 and T11. These results were called by telephone at the time of interpretation on 01/01/2023 at 3:59 pm to provider [facility] , who verbally acknowledged these results. Electronically Signed   By: Zetta Bills M.D.   On: 01/01/2023 15:59   DG Pelvis 1-2 Views  Result Date: 01/01/2023 CLINICAL DATA:  Fall with back pain in a 89 year old female. EXAM: PELVIS - 1-2 VIEW COMPARISON:  Lumbar spine evaluation of the same date. Prior pelvic evaluation from March of 2023 FINDINGS: Post bilateral hip arthroplasty. Femoral component of arthroplasty is are incompletely imaged. There is no sign of acute pelvic process in this osteopenic patient. Incidental note is made of degenerative changes in the lumbar spine. IMPRESSION: 1. Post bilateral hip arthroplasty without signs of acute fracture. 2. Degenerative changes in the lumbar spine. 3. Femoral components of bilateral hip arthroplasty changes are incompletely assessed. Electronically Signed   By: Zetta Bills M.D.   On: 01/01/2023 15:50   DG Chest 2 View  Result Date: 01/01/2023 CLINICAL DATA:  Fall, back pain. EXAM: CHEST - 2 VIEW COMPARISON:  Chest x-ray dated 02/01/2022. FINDINGS: Vague opacity within the RIGHT lower lung, possibly layering pleural effusion. LEFT lung appears clear. No pneumothorax is seen. Heart size and mediastinal contours appear stable. Interval changes of kyphoplasty/vertebroplasty at the level of the lower thoracic spine, involving at least 2 adjacent vertebral bodies. There is an additional compression fracture deformity at the thoracolumbar junction, of uncertain chronicity. IMPRESSION: 1. Vague opacity within the  RIGHT lower lung, possibly layering pleural effusion. Posttraumatic hemothorax not excluded. Neoplastic mass also not excluded. Recommend chest CT for further characterization. 2.  Interval changes of kyphoplasty/vertebroplasty at the level of the lower thoracic spine, involving at least 2 adjacent vertebral bodies. 3. There is an additional compression fracture deformity at the thoracolumbar junction, of uncertain chronicity. This can also be further characterized on the chest CT recommended above (ask technologist to make sure that the upper lumbar spine is included). Electronically Signed   By: Franki Cabot M.D.   On: 01/01/2023 15:44    Pending Labs Unresulted Labs (From admission, onward)     Start     Ordered   01/01/23 1505  Urinalysis, Routine w reflex microscopic -Urine, Clean Catch  Once,   URGENT       Question:  Specimen Source  Answer:  Urine, Clean Catch   01/01/23 1505            Vitals/Pain Today's Vitals   01/01/23 2145 01/01/23 2146 01/01/23 2200 01/01/23 2215  BP:  138/89 135/79   Pulse:  (!) 108 67 (!) 114  Resp:  16 17   Temp: 98.7 F (37.1 C)     TempSrc: Oral     SpO2:  97% 98% 100%    Isolation Precautions No active isolations  Medications Medications  0.9 %  sodium chloride infusion ( Intravenous New Bag/Given 01/01/23 1749)  iohexol (OMNIPAQUE) 300 MG/ML solution 100 mL (100 mLs Intravenous Contrast Given 01/01/23 1855)    Mobility power wheelchair     Focused Assessments    R Recommendations: See Admitting Provider Note  Report given to:   Additional Notes:

## 2023-01-01 NOTE — ED Notes (Signed)
Johnesha Becker said she was the patients POA and asked that you call her with an update.

## 2023-01-01 NOTE — ED Triage Notes (Signed)
Patient BIB EMS from home due to a fall last night. Patient has been crawling around on floor- states her legs are sore. Complains of lower back pain, hx of back surgery. Member is alert and oriented x 3, answers questions appropriately. Patients daughters insisted patient come to ER to be evaluated.

## 2023-01-01 NOTE — H&P (Signed)
History and Physical    Garnett Goertz [id number] DOB: October 29, 1932 DOA: 01/01/2023  PCP: [name], MD (Inactive)  Patient coming from: Home  Chief Complaint: Fall  HPI: [name] is a 89 y.o. female with medical history significant of CAD, permanent A-fib on Coumadin, mild to moderate MR, mild to moderate TR, hypertension, LVH, hyperlipidemia, pulmonary hypertension, arthritis, chronic back pain, depression, COPD and chronic bronchitis, PAD, GERD, prediabetes, urge incontinence, macular degeneration, B12 deficiency presents to the ED via EMS after a fall at home last night.  Reportedly patient has been crawling around on the floor and complained of soreness of her legs and low back pain.  AAO x 3 on arrival to the ED and answering questions appropriately.  Vital signs on arrival to the ED: Temperature 97.5 F, pulse 95, respiratory rate 18, blood pressure 130/93, SpO2 96% on room air.  Labs showing no leukocytosis, hemoglobin stable, potassium 5.3, BUN 26, creatinine 1.0 (baseline 0.8-1.0), AST 121, ALT 73, alk phos and T. bili normal, troponin 18> 17, CK 1993, UA pending, PT 50.4/INR 5.6.   X-ray of pelvis negative for fracture.    CT head/C-spine negative for acute finding.    CT chest negative for acute traumatic injury.  Showing rounded density in the right bronchus intermedius with complete low-density bronchial filling of the right lower lobe bronchi and complete right lower lobe collapse.  Acuity is uncertain but favored to be chronic.  There is a 2.9 cm fluid density structure within the area of atelectatic lung in the right lower lobe.  This is nonspecific.  Both neoplastic or infectious etiologies are considered.  Bronchoscopy recommended for further evaluation.  Also showing subpleural reticulation and groundglass in a peripheral and basilar predominant distribution suspicious for interstitial lung disease.  CT abdomen pelvis negative for acute traumatic  injury but showing incidental findings of hepatic steatosis, right renal scarring, and moderate size hiatal hernia.  CT thoracic spine showing acute generalized compression fracture of the T12 vertebral body with 30% height loss and 3 mm posterosuperior retropulsion.  Also showing chronic compression fractures of T10 and T11 with interval kyphoplasty.  Chronic compression fractures of T1, T2, T3, and T4.  Chronic T12-L1 left paracentral calcified disc extrusion with cranial extension.  CT of lumbar spine showing increased compression fracture of L3, now with 50% loss of anterior and central ventral body height and 30% loss of the posterior vertebral body height with posterosuperior L3 retropulsion causing moderate spinal canal stenosis.  Possible acute on chronic injury but radiologist suspects it is probably subacute or chronic.  History per patient: She reports chronic issues with balance due to peripheral neuropathy and uses a walker to ambulate.  However, continues to have frequent falls at home and lives alone.  Reports another fall either yesterday night or the night before.  She was in the kitchen with her walker but somehow the walker slipped away and she fell on her buttocks.  Denies head injury or loss of consciousness.  Denies preceding lightheadedness/dizziness, chest pain, shortness of breath.  States her legs were sore after the fall and she was dragging herself on the floor.  Patient states her 2 daughters live in [place] and came to check on her today and called EMS.  She denies any new pain since after her fall the other day.  Reports chronic back pain and history of back surgery done in [place] last year in March or April.  Patient states she is on Coumadin for A-fib and  the dose was increased a few months ago.  States her INR was checked a month ago and was within therapeutic range and no dose changes were made.  She is surprised to find out that her INR is elevated on labs done today as  her diet has not changed and she has not taken any extra doses of Coumadin.  Patient has no other complaints.  Denies fevers, cough, shortness of breath, chest pain, nausea, vomiting, abdominal pain, or diarrhea.  Review of Systems:  Review of Systems  All other systems reviewed and are negative.   Past Medical History:  Diagnosis Date   Arthritis    Back pain, chronic    Coronary artery disease CARDIOLOGIST- DR [name]--  LOV IN EPIC   Depression    Hammer toe    Hypercholesterolemia    Hypertensive heart disease    WITH LVH   LVH (left ventricular hypertrophy) due to hypertensive disease    Mitral insufficiency    CHRONIC   Pulmonary hypertension ([place])    Transfusion history     Past Surgical History:  Procedure Laterality Date   ABDOMINAL AORTOGRAM W/LOWER EXTREMITY N/A 04/29/2020   Procedure: ABDOMINAL AORTOGRAM W/LOWER EXTREMITY;  Surgeon: [name], MD;  Location: [facility];  Service: Cardiovascular;  Laterality: N/A;  Lt Leg   ABDOMINAL HYSTERECTOMY     ANTERIOR REPAIR/ [place] PUBOVAGINAL SLING  05-03-2002   BUNIONECTOMY WITH HAMMERTOE RECONSTRUCTION Right 03/01/2013   Procedure: RIGHT FOOT EXCISION BUNIONETTE AND FUSION OF DIP FOURTH TOE;  Surgeon: [name], MD;  Location: [facility];  Service: Orthopedics;  Laterality: Right;   CARDIAC CATHETERIZATION  02-03-2009  DR  [name]   SINGLE-VESSEL OBSTRUCTIVE CAD, SMALL DIAGONAL BRANCH/ NORMAL LVF/ SEVERE MITRAL INSUFFICIENCY/ SEVERE PULMONARY HYPERTENSION   CATARACT EXTRACTION W/ INTRAOCULAR LENS  IMPLANT, BILATERAL  2003   COLONOSCOPY  07/25/2014   few mall scattered diverticula in the ectire examined colon. Small internal hemmorhoids.    HAMMER TOE SURGERY  02/28/2012   Procedure: HAMMER TOE CORRECTION;  Surgeon: [name], MD;  Location: [facility];  Service: Orthopedics;  Laterality: Right;  FUSION OF PROXIMAL INTERPHALANGEAL  RIGHT THIRD CLAW TOE   PARS PLANA  VITRECTOMY W/ REPAIR OF MACULAR HOLE  02-04-2002   LEFT EYE   PERIPHERAL VASCULAR BALLOON ANGIOPLASTY  04/29/2020   Procedure: PERIPHERAL VASCULAR BALLOON ANGIOPLASTY;  Surgeon: [name], MD;  Location: [facility];  Service: Cardiovascular;;  Lt. AT   REPAIR RIGHT SECOND CLAW TOE/ VARUS MEDIAL CLOSING WEDGE OSTEOTOMY PROXIMAL PHALANX RIGHT GREAT TOE  05-05-2005   TOTAL HIP ARTHROPLASTY     BILATERAL---  LEFT 4/98;   RIGHT  11/98   VARUS OSTEOTOMY PROXIMAL PHALANX, LEFT GREAT TOE/ PARING DOWN THE CALLUS , SECOND LEFT TOE  10-27-2008     reports that she has never smoked. She has never used smokeless tobacco. She reports that she does not currently use alcohol after a past usage of about 1.0 standard drink of alcohol per week. She reports current drug use. Drug: Hydrocodone.  Allergies  Allergen Reactions   Sulfamethoxazole     Other reaction(s): Other (See Comments) Not listed   Sulfanilamide     unkn Other reaction(s): Other (See Comments) unkn   Sulfur Dioxide     Other reaction(s): Other (See Comments)   Penicillins Rash    Family History  Problem Relation Age of Onset   Heart disease Mother 47  Prostate cancer Father 41    Prior to Admission medications   Medication Sig Start Date End Date Taking? Authorizing Provider  ascorbic acid (VITAMIN C) 500 MG tablet Take 600 mg by mouth daily.     [provider]  atorvastatin (LIPITOR) 80 MG tablet Take 1 tablet (80 mg total) by mouth daily. 06/01/22 08/30/22  [name], Peter M, MD  Biotin 1000 MCG tablet Take 1 tablet by mouth daily.     [provider]  Calcium Carbonate-Vitamin D (CALCIUM + D PO) Take by mouth daily.    [provider]  cephALEXin (KEFLEX) 500 MG capsule Take 1 capsule (500 mg total) by mouth 2 (two) times daily. 06/28/22   [name], MD  Cholecalciferol (VITAMIN D PO) Take by mouth daily.    [provider]  cyanocobalamin (,VITAMIN B-12,) 1000  MCG/ML injection Inject 1 mL (1,000 mcg total) into the muscle every 30 (thirty) days. 09/14/21   [name], MD  diltiazem (CARDIZEM CD) 120 MG 24 hr capsule Take 120 mg by mouth daily. 02/24/22   [provider]  gabapentin (NEURONTIN) 300 MG capsule Take by mouth. 03/16/22   [provider]  lidocaine (LIDODERM) 5 % 1 patch daily. 02/09/22   [provider]  lisinopril (ZESTRIL) 20 MG tablet Take 1 tablet (20 mg total) by mouth daily. 06/22/22   [name], MD  oxybutynin (DITROPAN) 5 MG tablet Take 1 tablet (5 mg total) by mouth 3 (three) times daily. 10/20/22   [name], MD  oxyCODONE-acetaminophen (PERCOCET) 10-325 MG tablet Take 1 tablet by mouth 2 (two) times daily as needed. 02/16/22   [provider]  pantoprazole (PROTONIX) 40 MG tablet Take 40 mg by mouth daily. 02/20/20   [provider]  potassium chloride SA (KLOR-CON M) 20 MEQ tablet TAKE ONE TABLET BY MOUTH TWICE DAILY 06/20/22   [name], Peter M, MD  potassium chloride SA (KLOR-CON M) 20 MEQ tablet Take by mouth. 04/05/22   [provider]  sertraline (ZOLOFT) 100 MG tablet Take 1 tablet (100 mg total) by mouth daily. 11/16/22   CoxElnita Maxwell, MD  vitamin A 8000 UNIT capsule Take 8,000 Units by mouth daily.    [provider]  warfarin (COUMADIN) 2.5 MG tablet Take 1/2 tablet to 1 tablet daily by mouth or as directed by Anticoagulation Clinic. 11/21/22   [name], Peter M, MD  Zinc Picolinate POWD Take by mouth.    [provider]    Physical Exam: Vitals:   01/01/23 1427 01/01/23 1430 01/01/23 1835  BP: (!) 130/93 131/73 127/75  Pulse: 95 66 100  Resp: '18 18 18  '$ Temp: (!) 97.5 F (36.4 C) 97.6 F (36.4 C) 97.8 F (36.6 C)  TempSrc: Oral Oral Oral  SpO2: 96% 96% 98%    Physical Exam Vitals reviewed.  Constitutional:      General: She is not in acute distress. HENT:     Head: Normocephalic and atraumatic.  Eyes:      Extraocular Movements: Extraocular movements intact.  Cardiovascular:     Rate and Rhythm: Normal rate and regular rhythm.     Pulses: Normal pulses.     Heart sounds: Murmur heard.  Pulmonary:     Effort: Pulmonary effort is normal. No respiratory distress.     Breath sounds: Normal breath sounds. No wheezing or rales.  Abdominal:     General: Bowel sounds are normal. There is no distension.  Palpations: Abdomen is soft.     Tenderness: There is no abdominal tenderness.  Musculoskeletal:     Cervical back: Normal range of motion.     Right lower leg: No edema.     Left lower leg: No edema.  Skin:    General: Skin is warm and dry.  Neurological:     General: No focal deficit present.     Mental Status: She is alert and oriented to person, place, and time.     Cranial Nerves: No cranial nerve deficit.     Sensory: No sensory deficit.     Motor: No weakness.     Labs on Admission: I have personally reviewed following labs and imaging studies  CBC: Recent Labs  Lab 01/01/23 1505  WBC 9.7  NEUTROABS 8.4*  HGB 13.6  HCT 44.8  MCV 85.5  PLT [id number]   Basic Metabolic Panel: Recent Labs  Lab 01/01/23 1505  NA 135  K 5.3*  CL 103  CO2 25  GLUCOSE 110*  BUN 26*  CREATININE 1.04*  CALCIUM 9.9   GFR: CrCl cannot be calculated (Unknown ideal weight.). Liver Function Tests: Recent Labs  Lab 01/01/23 1505  AST 121*  ALT 73*  ALKPHOS 100  BILITOT 0.7  PROT 8.2*  ALBUMIN 3.1*   No results for input(s): "LIPASE", "AMYLASE" in the last 168 hours. No results for input(s): "AMMONIA" in the last 168 hours. Coagulation Profile: Recent Labs  Lab 01/01/23 1648  INR 5.6*   Cardiac Enzymes: Recent Labs  Lab 01/01/23 1505  CKTOTAL 1,993*   BNP (last 3 results) No results for input(s): "PROBNP" in the last 8760 hours. HbA1C: No results for input(s): "HGBA1C" in the last 72 hours. CBG: No results for input(s): "GLUCAP" in the last 168 hours. Lipid Profile: No  results for input(s): "CHOL", "HDL", "LDLCALC", "TRIG", "CHOLHDL", "LDLDIRECT" in the last 72 hours. Thyroid Function Tests: No results for input(s): "TSH", "T4TOTAL", "FREET4", "T3FREE", "THYROIDAB" in the last 72 hours. Anemia Panel: No results for input(s): "VITAMINB12", "FOLATE", "FERRITIN", "TIBC", "IRON", "RETICCTPCT" in the last 72 hours. Urine analysis:    Component Value Date/Time   BILIRUBINUR negative 06/28/2022 1605   BILIRUBINUR negative 05/13/2020 1621   KETONESUR negative 06/28/2022 1605   PROTEINUR Negative 05/13/2020 1621   UROBILINOGEN 0.2 06/28/2022 1605   NITRITE Negative 06/28/2022 1605   NITRITE negative 05/13/2020 1621   LEUKOCYTESUR Large (3+) (A) 06/28/2022 1605    Radiological Exams on Admission: CT Thoracic Spine Wo Contrast  Result Date: 01/01/2023 CLINICAL DATA:  Fall injury with back trauma.  Barium EXAM: CT  THORACIC AND LUMBAR SPINE WITHOUT CONTRAST TECHNIQUE: Multidetector CT imaging of the thoracic and lumbar spine was performed without intravenous contrast. Multiplanar CT image reconstructions were also generated. RADIATION DOSE REDUCTION: This exam was performed according to the departmental dose-optimization program which includes automated exposure control, adjustment of the mA and/or kV according to patient size and/or use of iterative reconstruction technique. COMPARISON:  MRI thoracic spine 02/03/22, CT lumbar spine 02/01/2022. FINDINGS: CT THORACIC SPINE FINDINGS Segmentation: There are 12 rib-bearing thoracic type segments. Alignment: Minimal to mild upper to midthoracic kyphodextroscoliosis is unchanged. Trace degenerative anterolisthesis is again noted at T2-3 and T4-5 and up to 3 mm posterosuperior retropulsion noted at T11 and 12 due to a chronic T11 and acute T12 compression fractures. Vertebrae: Generalized osteopenia. There is an acute generalized compression fracture of the T12 vertebral body with intact pedicles and posterior elements and  approximately 30% generalized vertebral height  loss with 3 mm posterosuperior retropulsion. At T10 and T11 there are chronic compression fractures which were previously acute, with approximately 30% and 50% maximum vertebral height loss, respectively with no increase in compression since the prior study with interval kyphoplasty at both levels. Again noted and unchanged are mild chronic wedge compression fractures of T1, T2 and T5 and mild-to-moderate chronic compression fractures of T3 and T4. Paraspinal and other soft tissues: There is dorsal paraspinous muscular atrophy which was seen previously. There is mucoid material and debris newly noted filling and obstructing the right lower lobe main and segmental bronchi with right lower lobe collapse. There are trace pleural effusions. Calcifications are partially visible in the thoracic aorta, coronary arteries and mitral ring. Moderate-sized hiatal hernia. Disc levels: The thoracic discs show multilevel degenerative disc height loss, spondylosis and facet spurring. There is no sizable disc herniation above T12 or cord compromise. Posterosuperior retropulsion at T11 and T12 flattens the ventral thecal sac without causing noteworthy spinal stenosis. At T12-L1, again noted is a left paracentral calcified disc extrusion extending cephalad in the epidural space posterior to the T12 body and partially effacing the ventral CSF. This however, was seen previously with no mass effect on the conus. The foramina are again moderately stenotic at T8-9, T9-10, T10-11, and T11-12. No other foraminal compromise. CT LUMBAR SPINE FINDINGS Segmentation: 5 lumbar type vertebrae. Alignment: There is slight dextroscoliosis. Alignment is physiologic except for a 4 mm posterosuperior retropulsion of the L3 vertebral body cortex. Vertebrae: Osteopenia. There previously was a 25% upper plate anterior wedge compression fracture deformity of L3 which has worsened to approximately 50% loss of the  anterior and central vertebral body height and 30% loss of the posterior vertebral body height since the prior study. There are intact anterior superior osteophytes of the L3 body suggesting a chronic or at least subacute process. There is no pedicle or posterior element fracture. The other lumbar vertebrae are normal in heights. No primary pathologic bone lesion is seen. The visualized sacrum and SI joints are intact, with SI joint spurring. Paraspinal and other soft tissues: Chronic fatty atrophy again noted in the dorsal paraspinous and bilateral iliopsoas musculature. Chronic scarring and cortical volume loss inferior pole right kidney. Aortic atherosclerosis without AAA. No mass or paraspinal hematoma. Disc levels: T12-L1: Disc degeneration and vacuum phenomenon with disc calcifications and a calcified left paracentral disc extrusion with cranial extension described above. Clear foramina. L1-2: Mild diffuse disc bulge without herniation or stenosis. Normal disc height. Clear foramina. Trace facet spurring. L2-3: Slight disc space loss. Posterosuperior L3 retropulsion causing moderate spinal canal stenosis along with hypertrophic ligamentous and facet disease. There is foraminal disc bulging, mild bilateral foraminal stenosis. L3-4: Normal disc height. Diffuse disc bulge, dorsal ligamentous and facet hypertrophy causing mild to moderate spinal canal stenosis. Foraminal disc bulging with mild foraminal stenosis. L4-5: Chronic disc collapse, vacuum phenomenon and circumferential disc osteophyte complex, along with dorsal ligamentous and facet hypertrophy causing moderate acquired spinal canal stenosis. There is foraminal disc bulging and mild-to-moderate foraminal stenosis on the right. L5-S1: Chronic degenerative disc collapse and circumferential disc osteophyte complex. No herniation or canal stenosis. Facet hypertrophy with moderate to severe foraminal stenosis. IMPRESSION: 1. Acute generalized compression  fracture of the T12 vertebral body with 30% height loss and 3 mm posterosuperior retropulsion. 2. Chronic compression fractures of T10 and T11 with interval kyphoplasty. 3. Chronic compression fractures of T1, T2, T3, T4. 4. Osteopenia and degenerative change thoracic and lumbar spine as detailed above.  5. Chronic T12-L1 left paracentral calcified disc extrusion with cranial extension. 6. Right lower lobe collapse with mucoid material and debris obstructing the right lower lobe main and segmental bronchi. 7. Trace pleural effusions. 8. Hiatal hernia. 9. Aortic and coronary artery atherosclerosis. 10. Since 02/01/2022 CT, increased compression fracture of L3, now with 50% loss of the anterior and central vertebral body height and 30% loss of the posterior vertebral body height with Posterosuperior L3 retropulsion causing moderate spinal canal stenosis. Possible this could be acute on chronic injury, but I suspect it is probably subacute or chronic. Aortic Atherosclerosis (ICD10-I70.0). Electronically Signed   By: Telford Nab M.D.   On: 01/01/2023 20:44   CT Lumbar Spine Wo Contrast  Result Date: 01/01/2023 CLINICAL DATA:  Fall injury with back trauma.  Barium EXAM: CT  THORACIC AND LUMBAR SPINE WITHOUT CONTRAST TECHNIQUE: Multidetector CT imaging of the thoracic and lumbar spine was performed without intravenous contrast. Multiplanar CT image reconstructions were also generated. RADIATION DOSE REDUCTION: This exam was performed according to the departmental dose-optimization program which includes automated exposure control, adjustment of the mA and/or kV according to patient size and/or use of iterative reconstruction technique. COMPARISON:  MRI thoracic spine 02/03/22, CT lumbar spine 02/01/2022. FINDINGS: CT THORACIC SPINE FINDINGS Segmentation: There are 12 rib-bearing thoracic type segments. Alignment: Minimal to mild upper to midthoracic kyphodextroscoliosis is unchanged. Trace degenerative  anterolisthesis is again noted at T2-3 and T4-5 and up to 3 mm posterosuperior retropulsion noted at T11 and 12 due to a chronic T11 and acute T12 compression fractures. Vertebrae: Generalized osteopenia. There is an acute generalized compression fracture of the T12 vertebral body with intact pedicles and posterior elements and approximately 30% generalized vertebral height loss with 3 mm posterosuperior retropulsion. At T10 and T11 there are chronic compression fractures which were previously acute, with approximately 30% and 50% maximum vertebral height loss, respectively with no increase in compression since the prior study with interval kyphoplasty at both levels. Again noted and unchanged are mild chronic wedge compression fractures of T1, T2 and T5 and mild-to-moderate chronic compression fractures of T3 and T4. Paraspinal and other soft tissues: There is dorsal paraspinous muscular atrophy which was seen previously. There is mucoid material and debris newly noted filling and obstructing the right lower lobe main and segmental bronchi with right lower lobe collapse. There are trace pleural effusions. Calcifications are partially visible in the thoracic aorta, coronary arteries and mitral ring. Moderate-sized hiatal hernia. Disc levels: The thoracic discs show multilevel degenerative disc height loss, spondylosis and facet spurring. There is no sizable disc herniation above T12 or cord compromise. Posterosuperior retropulsion at T11 and T12 flattens the ventral thecal sac without causing noteworthy spinal stenosis. At T12-L1, again noted is a left paracentral calcified disc extrusion extending cephalad in the epidural space posterior to the T12 body and partially effacing the ventral CSF. This however, was seen previously with no mass effect on the conus. The foramina are again moderately stenotic at T8-9, T9-10, T10-11, and T11-12. No other foraminal compromise. CT LUMBAR SPINE FINDINGS Segmentation: 5 lumbar  type vertebrae. Alignment: There is slight dextroscoliosis. Alignment is physiologic except for a 4 mm posterosuperior retropulsion of the L3 vertebral body cortex. Vertebrae: Osteopenia. There previously was a 25% upper plate anterior wedge compression fracture deformity of L3 which has worsened to approximately 50% loss of the anterior and central vertebral body height and 30% loss of the posterior vertebral body height since the prior study. There are intact anterior  superior osteophytes of the L3 body suggesting a chronic or at least subacute process. There is no pedicle or posterior element fracture. The other lumbar vertebrae are normal in heights. No primary pathologic bone lesion is seen. The visualized sacrum and SI joints are intact, with SI joint spurring. Paraspinal and other soft tissues: Chronic fatty atrophy again noted in the dorsal paraspinous and bilateral iliopsoas musculature. Chronic scarring and cortical volume loss inferior pole right kidney. Aortic atherosclerosis without AAA. No mass or paraspinal hematoma. Disc levels: T12-L1: Disc degeneration and vacuum phenomenon with disc calcifications and a calcified left paracentral disc extrusion with cranial extension described above. Clear foramina. L1-2: Mild diffuse disc bulge without herniation or stenosis. Normal disc height. Clear foramina. Trace facet spurring. L2-3: Slight disc space loss. Posterosuperior L3 retropulsion causing moderate spinal canal stenosis along with hypertrophic ligamentous and facet disease. There is foraminal disc bulging, mild bilateral foraminal stenosis. L3-4: Normal disc height. Diffuse disc bulge, dorsal ligamentous and facet hypertrophy causing mild to moderate spinal canal stenosis. Foraminal disc bulging with mild foraminal stenosis. L4-5: Chronic disc collapse, vacuum phenomenon and circumferential disc osteophyte complex, along with dorsal ligamentous and facet hypertrophy causing moderate acquired spinal  canal stenosis. There is foraminal disc bulging and mild-to-moderate foraminal stenosis on the right. L5-S1: Chronic degenerative disc collapse and circumferential disc osteophyte complex. No herniation or canal stenosis. Facet hypertrophy with moderate to severe foraminal stenosis. IMPRESSION: 1. Acute generalized compression fracture of the T12 vertebral body with 30% height loss and 3 mm posterosuperior retropulsion. 2. Chronic compression fractures of T10 and T11 with interval kyphoplasty. 3. Chronic compression fractures of T1, T2, T3, T4. 4. Osteopenia and degenerative change thoracic and lumbar spine as detailed above. 5. Chronic T12-L1 left paracentral calcified disc extrusion with cranial extension. 6. Right lower lobe collapse with mucoid material and debris obstructing the right lower lobe main and segmental bronchi. 7. Trace pleural effusions. 8. Hiatal hernia. 9. Aortic and coronary artery atherosclerosis. 10. Since 02/01/2022 CT, increased compression fracture of L3, now with 50% loss of the anterior and central vertebral body height and 30% loss of the posterior vertebral body height with Posterosuperior L3 retropulsion causing moderate spinal canal stenosis. Possible this could be acute on chronic injury, but I suspect it is probably subacute or chronic. Aortic Atherosclerosis (ICD10-I70.0). Electronically Signed   By: Telford Nab M.D.   On: 01/01/2023 20:44   CT Chest W Contrast  Result Date: 01/01/2023 CLINICAL DATA:  89 year old post trauma. EXAM: CT CHEST, ABDOMEN, AND PELVIS WITH CONTRAST TECHNIQUE: Multidetector CT imaging of the chest, abdomen and pelvis was performed following the standard protocol during bolus administration of intravenous contrast. RADIATION DOSE REDUCTION: This exam was performed according to the departmental dose-optimization program which includes automated exposure control, adjustment of the mA and/or kV according to patient size and/or use of iterative  reconstruction technique. CONTRAST:  150m OMNIPAQUE IOHEXOL 300 MG/ML  SOLN COMPARISON:  Radiographs earlier today. Chest CT 03/25/2010 FINDINGS: CT CHEST FINDINGS Cardiovascular: No evidence of acute aortic or vascular injury. Aortic atherosclerosis. Aortic valvular and mitral annulus calcifications. The heart is normal in size. There is no pericardial effusion. Mediastinum/Nodes: Scattered small mediastinal lymph nodes are not enlarged by size criteria. Prominent right hilar node is likely reactive. Moderate-sized hiatal hernia no pneumomediastinum. Lungs/Pleura: No pneumothorax. Rounded density in the right bronchus intermedius with complete low-density bronchial filling of the right lower lobe bronchi and complete right lower lobe collapse. There is a 2.9 cm fluid density structure  within the area of atelectatic lung in the right lower lobe series 2, image 33. Subpleural reticulation and ground-glass in a peripheral and basilar predominant distribution. No significant pleural effusion. Musculoskeletal: Thoracic spine assessed on concurrent thoracic spine reformats, reported separately. No acute fracture of the ribs, sternum or included clavicles and shoulder girdles. CT ABDOMEN PELVIS FINDINGS Hepatobiliary: No hepatic injury or perihepatic hematoma. Mild hepatic steatosis. Gallbladder is unremarkable. Pancreas: No evidence of injury. No ductal dilatation or inflammation. Spleen: No splenic injury or perisplenic hematoma. Adrenals/Urinary Tract: No adrenal hemorrhage or renal injury identified. Atrophy and cortical scarring in the lower pole of the right kidney may represent sequela of prior renal infarct. Bladder is unremarkable. Stomach/Bowel: There is no evidence of bowel injury or mesenteric hematoma. No bowel inflammation or obstruction. Moderate-sized hiatal hernia. Vascular/Lymphatic: No evidence of vascular injury. Prominent aortic atherosclerosis without aneurysm. No retroperitoneal fluid. No gross  adenopathy. Reproductive: Status post hysterectomy. No adnexal masses. Other: No free air or free fluid. Musculoskeletal: Lumbar spine assessed on concurrent lumbar spine reformats, reported separately. Bilateral hip arthroplasties. There is no evidence of pelvic fracture. IMPRESSION: 1. No evidence of acute traumatic injury to the chest, abdomen, or pelvis. 2. Reference thoracic and lumbar spine reformats for spinal evaluation. 3. Rounded density in the right bronchus intermedius with complete low-density bronchial filling of the right lower lobe bronchi and complete right lower lobe collapse. Acuity is uncertain but favored to be chronic. There is a 2.9 cm fluid density structure within the area of atelectatic lung in the right lower lobe. This is nonspecific. Both neoplastic or infectious etiologies are considered. Recommend bronchoscopy for further evaluation. 4. Subpleural reticulation and ground-glass in a peripheral and basilar predominant distribution, suspicious for interstitial lung disease. 5. Incidental findings in the abdomen and pelvis of hepatic steatosis and right renal scarring. Moderate-sized hiatal hernia. Aortic Atherosclerosis (ICD10-I70.0). Electronically Signed   By: Keith Rake M.D.   On: 01/01/2023 20:05   CT ABDOMEN PELVIS W CONTRAST  Result Date: 01/01/2023 CLINICAL DATA:  89 year old post trauma. EXAM: CT CHEST, ABDOMEN, AND PELVIS WITH CONTRAST TECHNIQUE: Multidetector CT imaging of the chest, abdomen and pelvis was performed following the standard protocol during bolus administration of intravenous contrast. RADIATION DOSE REDUCTION: This exam was performed according to the departmental dose-optimization program which includes automated exposure control, adjustment of the mA and/or kV according to patient size and/or use of iterative reconstruction technique. CONTRAST:  15m OMNIPAQUE IOHEXOL 300 MG/ML  SOLN COMPARISON:  Radiographs earlier today. Chest CT 03/25/2010 FINDINGS:  CT CHEST FINDINGS Cardiovascular: No evidence of acute aortic or vascular injury. Aortic atherosclerosis. Aortic valvular and mitral annulus calcifications. The heart is normal in size. There is no pericardial effusion. Mediastinum/Nodes: Scattered small mediastinal lymph nodes are not enlarged by size criteria. Prominent right hilar node is likely reactive. Moderate-sized hiatal hernia no pneumomediastinum. Lungs/Pleura: No pneumothorax. Rounded density in the right bronchus intermedius with complete low-density bronchial filling of the right lower lobe bronchi and complete right lower lobe collapse. There is a 2.9 cm fluid density structure within the area of atelectatic lung in the right lower lobe series 2, image 33. Subpleural reticulation and ground-glass in a peripheral and basilar predominant distribution. No significant pleural effusion. Musculoskeletal: Thoracic spine assessed on concurrent thoracic spine reformats, reported separately. No acute fracture of the ribs, sternum or included clavicles and shoulder girdles. CT ABDOMEN PELVIS FINDINGS Hepatobiliary: No hepatic injury or perihepatic hematoma. Mild hepatic steatosis. Gallbladder is unremarkable. Pancreas: No evidence of injury.  No ductal dilatation or inflammation. Spleen: No splenic injury or perisplenic hematoma. Adrenals/Urinary Tract: No adrenal hemorrhage or renal injury identified. Atrophy and cortical scarring in the lower pole of the right kidney may represent sequela of prior renal infarct. Bladder is unremarkable. Stomach/Bowel: There is no evidence of bowel injury or mesenteric hematoma. No bowel inflammation or obstruction. Moderate-sized hiatal hernia. Vascular/Lymphatic: No evidence of vascular injury. Prominent aortic atherosclerosis without aneurysm. No retroperitoneal fluid. No gross adenopathy. Reproductive: Status post hysterectomy. No adnexal masses. Other: No free air or free fluid. Musculoskeletal: Lumbar spine assessed on  concurrent lumbar spine reformats, reported separately. Bilateral hip arthroplasties. There is no evidence of pelvic fracture. IMPRESSION: 1. No evidence of acute traumatic injury to the chest, abdomen, or pelvis. 2. Reference thoracic and lumbar spine reformats for spinal evaluation. 3. Rounded density in the right bronchus intermedius with complete low-density bronchial filling of the right lower lobe bronchi and complete right lower lobe collapse. Acuity is uncertain but favored to be chronic. There is a 2.9 cm fluid density structure within the area of atelectatic lung in the right lower lobe. This is nonspecific. Both neoplastic or infectious etiologies are considered. Recommend bronchoscopy for further evaluation. 4. Subpleural reticulation and ground-glass in a peripheral and basilar predominant distribution, suspicious for interstitial lung disease. 5. Incidental findings in the abdomen and pelvis of hepatic steatosis and right renal scarring. Moderate-sized hiatal hernia. Aortic Atherosclerosis (ICD10-I70.0). Electronically Signed   By: Keith Rake M.D.   On: 01/01/2023 20:05   CT Head Wo Contrast  Result Date: 01/01/2023 CLINICAL DATA:  Head trauma, minor (Age >= 65y); Neck trauma (Age >= 65y) EXAM: CT HEAD WITHOUT CONTRAST CT CERVICAL SPINE WITHOUT CONTRAST TECHNIQUE: Multidetector CT imaging of the head and cervical spine was performed following the standard protocol without intravenous contrast. Multiplanar CT image reconstructions of the cervical spine were also generated. RADIATION DOSE REDUCTION: This exam was performed according to the departmental dose-optimization program which includes automated exposure control, adjustment of the mA and/or kV according to patient size and/or use of iterative reconstruction technique. COMPARISON:  02/01/2022 FINDINGS: CT HEAD FINDINGS Brain: No evidence of acute infarction, hemorrhage, hydrocephalus, extra-axial collection or mass lesion/mass effect.  Extensive low-density changes within the periventricular and subcortical white matter most compatible with chronic microvascular ischemic change. Moderate diffuse cerebral volume loss. Vascular: Atherosclerotic calcifications involving the large vessels of the skull base. No unexpected hyperdense vessel. Skull: Normal. Negative for fracture or focal lesion. Sinuses/Orbits: No acute finding. Other: None. CT CERVICAL SPINE FINDINGS Alignment: Facet joints are aligned without dislocation or traumatic listhesis. Dens and lateral masses are aligned. Skull base and vertebrae: No acute fracture. No primary bone lesion or focal pathologic process. Soft tissues and spinal canal: No prevertebral fluid or swelling. No visible canal hematoma. Disc levels: Advanced facet-predominant multilevel cervical spondylosis. Upper chest: Negative. Other: Bilateral carotid atherosclerosis. IMPRESSION: 1. No acute intracranial abnormality. 2. No acute fracture or subluxation of the cervical spine. Electronically Signed   By: Davina Poke D.O.   On: 01/01/2023 16:27   CT Cervical Spine Wo Contrast  Result Date: 01/01/2023 CLINICAL DATA:  Head trauma, minor (Age >= 65y); Neck trauma (Age >= 65y) EXAM: CT HEAD WITHOUT CONTRAST CT CERVICAL SPINE WITHOUT CONTRAST TECHNIQUE: Multidetector CT imaging of the head and cervical spine was performed following the standard protocol without intravenous contrast. Multiplanar CT image reconstructions of the cervical spine were also generated. RADIATION DOSE REDUCTION: This exam was performed according to the departmental dose-optimization program  which includes automated exposure control, adjustment of the mA and/or kV according to patient size and/or use of iterative reconstruction technique. COMPARISON:  02/01/2022 FINDINGS: CT HEAD FINDINGS Brain: No evidence of acute infarction, hemorrhage, hydrocephalus, extra-axial collection or mass lesion/mass effect. Extensive low-density changes within  the periventricular and subcortical white matter most compatible with chronic microvascular ischemic change. Moderate diffuse cerebral volume loss. Vascular: Atherosclerotic calcifications involving the large vessels of the skull base. No unexpected hyperdense vessel. Skull: Normal. Negative for fracture or focal lesion. Sinuses/Orbits: No acute finding. Other: None. CT CERVICAL SPINE FINDINGS Alignment: Facet joints are aligned without dislocation or traumatic listhesis. Dens and lateral masses are aligned. Skull base and vertebrae: No acute fracture. No primary bone lesion or focal pathologic process. Soft tissues and spinal canal: No prevertebral fluid or swelling. No visible canal hematoma. Disc levels: Advanced facet-predominant multilevel cervical spondylosis. Upper chest: Negative. Other: Bilateral carotid atherosclerosis. IMPRESSION: 1. No acute intracranial abnormality. 2. No acute fracture or subluxation of the cervical spine. Electronically Signed   By: Davina Poke D.O.   On: 01/01/2023 16:27   DG Lumbar Spine Complete  Result Date: 01/01/2023 CLINICAL DATA:  Fall with back pain. EXAM: LUMBAR SPINE - COMPLETE 4+ VIEW COMPARISON:  CT of the lumbar spine peripheral armed on March 06/26/2022 FINDINGS: Osteopenia.  Spinal degenerative changes. Signs of cement augmentation in the lower thoracic spine. Five lumbar type vertebral bodies. Similar appearance of slight increased loss of height suspected also at L1 since imaging from February 01, 2022. Marked narrowing of the disc space at L4-5 and L5-S1 with facet hypertrophy at these levels as well. Signs of interval at least 40% loss of height of the T12 vertebral body. Subtle lucency across the posterior cortex and mild retropulsion suspected. Difficult to exclude pedicle involvement. No gross change in alignment. T10 and T11 cement augmentation since prior imaging. IMPRESSION: 1. Signs of interval at least 40% loss of height of the T12 vertebral body.  Subtle lucency across the posterior cortex and mild retropulsion suspected. Difficult to exclude pedicle involvement. CT may be helpful for further evaluation. 2. Suspect slight interval loss of height also at L3 where there was of compression fracture seen on previous image. Unclear whether this is acute or represents changes related to healing and interval change since February 01, 2022. Correlate with any signs of point tenderness in this location. 3. Signs of cement augmentation of vertebral bodies in the lower thoracic spine at T10 and T11. These results were called by telephone at the time of interpretation on 01/01/2023 at 3:59 pm to provider [place] , who verbally acknowledged these results. Electronically Signed   By: Zetta Bills M.D.   On: 01/01/2023 15:59   DG Pelvis 1-2 Views  Result Date: 01/01/2023 CLINICAL DATA:  Fall with back pain in a 89 year old female. EXAM: PELVIS - 1-2 VIEW COMPARISON:  Lumbar spine evaluation of the same date. Prior pelvic evaluation from March of 2023 FINDINGS: Post bilateral hip arthroplasty. Femoral component of arthroplasty is are incompletely imaged. There is no sign of acute pelvic process in this osteopenic patient. Incidental note is made of degenerative changes in the lumbar spine. IMPRESSION: 1. Post bilateral hip arthroplasty without signs of acute fracture. 2. Degenerative changes in the lumbar spine. 3. Femoral components of bilateral hip arthroplasty changes are incompletely assessed. Electronically Signed   By: Zetta Bills M.D.   On: 01/01/2023 15:50   DG Chest 2 View  Result Date: 01/01/2023 CLINICAL DATA:  Fall,  back pain. EXAM: CHEST - 2 VIEW COMPARISON:  Chest x-ray dated 02/01/2022. FINDINGS: Vague opacity within the RIGHT lower lung, possibly layering pleural effusion. LEFT lung appears clear. No pneumothorax is seen. Heart size and mediastinal contours appear stable. Interval changes of kyphoplasty/vertebroplasty at the level of the lower  thoracic spine, involving at least 2 adjacent vertebral bodies. There is an additional compression fracture deformity at the thoracolumbar junction, of uncertain chronicity. IMPRESSION: 1. Vague opacity within the RIGHT lower lung, possibly layering pleural effusion. Posttraumatic hemothorax not excluded. Neoplastic mass also not excluded. Recommend chest CT for further characterization. 2. Interval changes of kyphoplasty/vertebroplasty at the level of the lower thoracic spine, involving at least 2 adjacent vertebral bodies. 3. There is an additional compression fracture deformity at the thoracolumbar junction, of uncertain chronicity. This can also be further characterized on the chest CT recommended above (ask technologist to make sure that the upper lumbar spine is included). Electronically Signed   By: Franki Cabot M.D.   On: 01/01/2023 15:44    EKG: Independently reviewed.  Rate controlled A-fib, nonspecific T wave abnormality.  No significant change since prior tracing.  Assessment and Plan  Recurrent falls Appear to be related to chronic issues with balance in the setting of peripheral neuropathy, uses a walker to ambulate.  She had another fall the other night which appears to be mechanical based on history.  Patient denies head injury or loss of consciousness.  Denies preceding lightheadedness/dizziness, chest pain, shortness of breath.  She lives alone and it is not safe for her to return home given recurrent falls and the fact that she is on chronic anticoagulation for A-fib.  PT/OT consulted, fall precautions.  TOC consulted to help with placement.  Permanent A-fib on Coumadin with supratherapeutic INR INR supratherapeutic at 5.6.  Patient reports increase in Coumadin dose a few months ago but reportedly had INR checked a month ago and it was within therapeutic range.  She denies any changes to her diet or taking any extra doses of Coumadin.  Given recurrent falls, risk versus benefit of  chronic anticoagulation needs to be discussed with the patient and her daughters.  Hold Coumadin at this time and repeat PT/INR in the morning.  No evidence of bleeding on trauma scans.  Hemoglobin is stable.  A-fib currently rate controlled, continue Cardizem.  Rhabdomyolysis In the setting of recent fall.  CK 1993.  Creatinine is at baseline.  Give IV fluid hydration and repeat CK in a.m.  Mild hyperkalemia Potassium 5.3 in the setting of rhabdomyolysis.  Continue to monitor labs closely.  Cardiac monitoring.  Hold home lisinopril.  Elevated transaminases AST 121, ALT 73.  Alk phos and T. bili normal.  Patient is not endorsing any GI symptoms.  CT showing hepatic steatosis but LFTs previously normal.  Rhabdomyolysis likely contributing.  Repeat LFTs in AM.  Avoid hepatotoxic agents.  Acute T12 vertebral compression fracture Multilevel chronic vertebral compression fractures CT thoracic spine showing acute generalized compression fracture of the T12 vertebral body with 30% height loss and 3 mm posterosuperior retropulsion.  Also showing chronic compression fractures of T10 and T11 with interval kyphoplasty.  Chronic compression fractures of T1, T2, T3, and T4.  Chronic T12-L1 left paracentral calcified disc extrusion with cranial extension. CT of lumbar spine showing increased compression fracture of L3, now with 50% loss of anterior and central ventral body height and 30% loss of the posterior vertebral body height with posterosuperior L3 retropulsion causing moderate spinal canal stenosis.  Possible acute on chronic injury but radiologist suspects it is probably subacute or chronic.  Patient is not endorsing any back pain at this time.  No neurologic deficit.  Continue pain management as needed and outpatient follow-up with her spine surgeon.  PT/OT eval, fall precautions.  Chronic right lung lower lobe collapse secondary to obstruction CT chest showing rounded density in the right bronchus  intermedius with complete low-density bronchial filling of the right lower lobe bronchi and complete right lower lobe collapse.  Acuity is uncertain but favored to be chronic.  There is a 2.9 cm fluid density structure within the area of atelectatic lung in the right lower lobe.  This is nonspecific.  Both neoplastic or infectious etiologies are considered.  Patient will need pulmonology evaluation for bronchoscopy once INR improves.  CAD Troponin 18> 17 and patient is not endorsing chest pain.  Continue cardiac monitoring.  Hypertension Blood pressure stable.  Continue Cardizem.  Hyperlipidemia Hold statin at this time in the setting of rhabdomyolysis/elevated liver enzymes.  Depression Continue Zoloft.  COPD/chronic bronchitis Stable, no wheezing or shortness of breath.  DuoNeb as needed.  GERD Continue Protonix.  Prediabetes A1c 6.4 on 10/04/2022.  Peripheral neuropathy Continue gabapentin.  DVT prophylaxis: Coumadin held at this time due to supratherapeutic INR. Code Status: DNR/DNI (discussed with the patient) Family Communication: No family available at this time. Level of care: Telemetry bed Admission status: It is my clinical opinion that referral for OBSERVATION is reasonable and necessary in this patient based on the above information provided. The aforementioned taken together are felt to place the patient at high risk for further clinical deterioration. However, it is anticipated that the patient may be medically stable for discharge from the hospital within 24 to 48 hours.   Shela Leff MD [facility]  If 7PM-7AM, please contact night-coverage [email or web address]  01/01/2023, 9:22 PM

## 2023-01-01 NOTE — ED Provider Notes (Signed)
[facility] Provider Note   CSN: [id number] Arrival date & time: 01/01/23  1414     History  Chief Complaint  Patient presents with   [name] is a 89 y.o. female.  89 year old female with prior medical history as detailed below presents for evaluation.  Patient arrives with EMS transport from home.  Patient apparently fell yesterday evening.  Patient was unable to stand after the fall.  EMS reports that she was crawling around her house and so she was able to call family who called EMS.  Patient complains of low back pain.  Patient with prior history of compression fractures in the back.  Patient is somewhat confused as the details as to how she ended up on the floor.  She denies extremity injury.  She denies associated chest pain or shortness of breath.  Contact attempted with patient's daughters - [facility]) [phone] and Cynda Acres [phone].  [name] was contacted.  She reports multiple falls yesterday with apparent fall late in the evening where the patient was able to stand.  Patient [place].  Patient apparently lives by herself.  The history is provided by the patient and medical records.       Home Medications Prior to Admission medications   Medication Sig Start Date End Date Taking? Authorizing Provider  ascorbic acid (VITAMIN C) 500 MG tablet Take 600 mg by mouth daily.     [provider]  atorvastatin (LIPITOR) 80 MG tablet Take 1 tablet (80 mg total) by mouth daily. 06/01/22 08/30/22  [name], Izsak Meir M, MD  Biotin 1000 MCG tablet Take 1 tablet by mouth daily.     [provider]  Calcium Carbonate-Vitamin D (CALCIUM + D PO) Take by mouth daily.    [provider]  cephALEXin (KEFLEX) 500 MG capsule Take 1 capsule (500 mg total) by mouth 2 (two) times daily. 06/28/22   [name], MD  Cholecalciferol (VITAMIN D  PO) Take by mouth daily.    [provider]  cyanocobalamin (,VITAMIN B-12,) 1000 MCG/ML injection Inject 1 mL (1,000 mcg total) into the muscle every 30 (thirty) days. 09/14/21   [name], MD  diltiazem (CARDIZEM CD) 120 MG 24 hr capsule Take 120 mg by mouth daily. 02/24/22   [provider]  gabapentin (NEURONTIN) 300 MG capsule Take by mouth. 03/16/22   [provider]  lidocaine (LIDODERM) 5 % 1 patch daily. 02/09/22   [provider]  lisinopril (ZESTRIL) 20 MG tablet Take 1 tablet (20 mg total) by mouth daily. 06/22/22   [name], MD  oxybutynin (DITROPAN) 5 MG tablet Take 1 tablet (5 mg total) by mouth 3 (three) times daily. 10/20/22   [name], MD  oxyCODONE-acetaminophen (PERCOCET) 10-325 MG tablet Take 1 tablet by mouth 2 (two) times daily as needed. 02/16/22   [provider]  pantoprazole (PROTONIX) 40 MG tablet Take 40 mg by mouth daily. 02/20/20   [provider]  potassium chloride SA (KLOR-CON M) 20 MEQ tablet TAKE ONE TABLET BY MOUTH TWICE DAILY 06/20/22   [name], Cadarius Nevares M, MD  potassium chloride SA (KLOR-CON M) 20 MEQ tablet Take by mouth. 04/05/22   [provider]  sertraline (ZOLOFT) 100 MG tablet Take 1 tablet (100 mg total) by mouth daily. 11/16/22   CoxElnita Maxwell, MD  vitamin A 8000 UNIT capsule Take 8,000 Units by mouth  daily.    [provider]  warfarin (COUMADIN) 2.5 MG tablet Take 1/2 tablet to 1 tablet daily by mouth or as directed by Anticoagulation Clinic. 11/21/22   [name], Mikylah Ackroyd M, MD  Zinc Picolinate POWD Take by mouth.    [provider]      Allergies    Sulfamethoxazole, Sulfanilamide, Sulfur dioxide, and Penicillins    Review of Systems   Review of Systems  All other systems reviewed and are negative.   Physical Exam Updated Vital Signs BP 127/75 (BP Location: Left Arm)   Pulse 100   Temp 97.8 F (36.6 C) (Oral)   Resp 18   SpO2 98%   Physical Exam Vitals and nursing note reviewed.  Constitutional:      General: She is not in acute distress.    Appearance: Normal appearance. She is well-developed.  HENT:     Head: Normocephalic and atraumatic.  Eyes:     Conjunctiva/sclera: Conjunctivae normal.     Pupils: Pupils are equal, round, and reactive to light.  Cardiovascular:     Rate and Rhythm: Normal rate and regular rhythm.     Heart sounds: Normal heart sounds.  Pulmonary:     Effort: Pulmonary effort is normal. No respiratory distress.     Breath sounds: Normal breath sounds.  Abdominal:     General: There is no distension.     Palpations: Abdomen is soft.     Tenderness: There is no abdominal tenderness.  Musculoskeletal:        General: Tenderness present. No deformity. Normal range of motion.     Cervical back: Normal range of motion and neck supple.     Comments: Patient localizes pain in the low back to the midline at approximately T12-L1.  Skin:    General: Skin is warm and dry.  Neurological:     General: No focal deficit present.     Mental Status: She is alert and oriented to person, place, and time.     ED Results / Procedures / Treatments   Labs (all labs ordered are listed, but only abnormal results are displayed) Labs Reviewed  CBC WITH DIFFERENTIAL/PLATELET - Abnormal; Notable for the following components:      Result Value   RBC 5.24 (*)    RDW 17.7 (*)    Neutro Abs 8.4 (*)    Lymphs Abs 0.6 (*)    All other components within normal limits  COMPREHENSIVE METABOLIC PANEL - Abnormal; Notable for the following components:   Potassium 5.3 (*)    Glucose, Bld 110 (*)    BUN 26 (*)    Creatinine, Ser 1.04 (*)    Total Protein 8.2 (*)    Albumin 3.1 (*)    AST 121 (*)    ALT 73 (*)    GFR, Estimated 52 (*)    All other components within normal limits  CK - Abnormal; Notable for the following components:   Total CK 1,993 (*)    All other components within normal limits   PROTIME-INR - Abnormal; Notable for the following components:   Prothrombin Time 50.4 (*)    INR 5.6 (*)    All other components within normal limits  TROPONIN I (HIGH SENSITIVITY) - Abnormal; Notable for the following components:   Troponin I (High Sensitivity) 18 (*)    All other components within normal limits  URINALYSIS, ROUTINE W REFLEX MICROSCOPIC  TROPONIN I (HIGH SENSITIVITY)    EKG EKG Interpretation  Date/Time:  Sunday January 01 2023 16:32:29 EST Ventricular Rate:  83 PR Interval:    QRS Duration: 82 QT Interval:  345 QTC Calculation: 406 R Axis:   98 Text Interpretation: Atrial fibrillation Right axis deviation Low voltage, extremity leads Anteroseptal infarct, old Confirmed by Dene Gentry [phone]) on 01/01/2023 4:47:04 PM  Radiology CT Thoracic Spine Wo Contrast  Result Date: 01/01/2023 CLINICAL DATA:  Fall injury with back trauma.  Barium EXAM: CT  THORACIC AND LUMBAR SPINE WITHOUT CONTRAST TECHNIQUE: Multidetector CT imaging of the thoracic and lumbar spine was performed without intravenous contrast. Multiplanar CT image reconstructions were also generated. RADIATION DOSE REDUCTION: This exam was performed according to the departmental dose-optimization program which includes automated exposure control, adjustment of the mA and/or kV according to patient size and/or use of iterative reconstruction technique. COMPARISON:  MRI thoracic spine 02/03/22, CT lumbar spine 02/01/2022. FINDINGS: CT THORACIC SPINE FINDINGS Segmentation: There are 12 rib-bearing thoracic type segments. Alignment: Minimal to mild upper to midthoracic kyphodextroscoliosis is unchanged. Trace degenerative anterolisthesis is again noted at T2-3 and T4-5 and up to 3 mm posterosuperior retropulsion noted at T11 and 12 due to a chronic T11 and acute T12 compression fractures. Vertebrae: Generalized osteopenia. There is an acute generalized compression fracture of the T12 vertebral body with intact pedicles  and posterior elements and approximately 30% generalized vertebral height loss with 3 mm posterosuperior retropulsion. At T10 and T11 there are chronic compression fractures which were previously acute, with approximately 30% and 50% maximum vertebral height loss, respectively with no increase in compression since the prior study with interval kyphoplasty at both levels. Again noted and unchanged are mild chronic wedge compression fractures of T1, T2 and T5 and mild-to-moderate chronic compression fractures of T3 and T4. Paraspinal and other soft tissues: There is dorsal paraspinous muscular atrophy which was seen previously. There is mucoid material and debris newly noted filling and obstructing the right lower lobe main and segmental bronchi with right lower lobe collapse. There are trace pleural effusions. Calcifications are partially visible in the thoracic aorta, coronary arteries and mitral ring. Moderate-sized hiatal hernia. Disc levels: The thoracic discs show multilevel degenerative disc height loss, spondylosis and facet spurring. There is no sizable disc herniation above T12 or cord compromise. Posterosuperior retropulsion at T11 and T12 flattens the ventral thecal sac without causing noteworthy spinal stenosis. At T12-L1, again noted is a left paracentral calcified disc extrusion extending cephalad in the epidural space posterior to the T12 body and partially effacing the ventral CSF. This however, was seen previously with no mass effect on the conus. The foramina are again moderately stenotic at T8-9, T9-10, T10-11, and T11-12. No other foraminal compromise. CT LUMBAR SPINE FINDINGS Segmentation: 5 lumbar type vertebrae. Alignment: There is slight dextroscoliosis. Alignment is physiologic except for a 4 mm posterosuperior retropulsion of the L3 vertebral body cortex. Vertebrae: Osteopenia. There previously was a 25% upper plate anterior wedge compression fracture deformity of L3 which has worsened to  approximately 50% loss of the anterior and central vertebral body height and 30% loss of the posterior vertebral body height since the prior study. There are intact anterior superior osteophytes of the L3 body suggesting a chronic or at least subacute process. There is no pedicle or posterior element fracture. The other lumbar vertebrae are normal in heights. No primary pathologic bone lesion is seen. The visualized sacrum and SI joints are intact, with SI joint spurring. Paraspinal and other soft tissues: Chronic fatty atrophy again noted in the dorsal paraspinous  and bilateral iliopsoas musculature. Chronic scarring and cortical volume loss inferior pole right kidney. Aortic atherosclerosis without AAA. No mass or paraspinal hematoma. Disc levels: T12-L1: Disc degeneration and vacuum phenomenon with disc calcifications and a calcified left paracentral disc extrusion with cranial extension described above. Clear foramina. L1-2: Mild diffuse disc bulge without herniation or stenosis. Normal disc height. Clear foramina. Trace facet spurring. L2-3: Slight disc space loss. Posterosuperior L3 retropulsion causing moderate spinal canal stenosis along with hypertrophic ligamentous and facet disease. There is foraminal disc bulging, mild bilateral foraminal stenosis. L3-4: Normal disc height. Diffuse disc bulge, dorsal ligamentous and facet hypertrophy causing mild to moderate spinal canal stenosis. Foraminal disc bulging with mild foraminal stenosis. L4-5: Chronic disc collapse, vacuum phenomenon and circumferential disc osteophyte complex, along with dorsal ligamentous and facet hypertrophy causing moderate acquired spinal canal stenosis. There is foraminal disc bulging and mild-to-moderate foraminal stenosis on the right. L5-S1: Chronic degenerative disc collapse and circumferential disc osteophyte complex. No herniation or canal stenosis. Facet hypertrophy with moderate to severe foraminal stenosis. IMPRESSION: 1.  Acute generalized compression fracture of the T12 vertebral body with 30% height loss and 3 mm posterosuperior retropulsion. 2. Chronic compression fractures of T10 and T11 with interval kyphoplasty. 3. Chronic compression fractures of T1, T2, T3, T4. 4. Osteopenia and degenerative change thoracic and lumbar spine as detailed above. 5. Chronic T12-L1 left paracentral calcified disc extrusion with cranial extension. 6. Right lower lobe collapse with mucoid material and debris obstructing the right lower lobe main and segmental bronchi. 7. Trace pleural effusions. 8. Hiatal hernia. 9. Aortic and coronary artery atherosclerosis. 10. Since 02/01/2022 CT, increased compression fracture of L3, now with 50% loss of the anterior and central vertebral body height and 30% loss of the posterior vertebral body height with Posterosuperior L3 retropulsion causing moderate spinal canal stenosis. Possible this could be acute on chronic injury, but I suspect it is probably subacute or chronic. Aortic Atherosclerosis (ICD10-I70.0). Electronically Signed   By: Telford Nab M.D.   On: 01/01/2023 20:44   CT Lumbar Spine Wo Contrast  Result Date: 01/01/2023 CLINICAL DATA:  Fall injury with back trauma.  Barium EXAM: CT  THORACIC AND LUMBAR SPINE WITHOUT CONTRAST TECHNIQUE: Multidetector CT imaging of the thoracic and lumbar spine was performed without intravenous contrast. Multiplanar CT image reconstructions were also generated. RADIATION DOSE REDUCTION: This exam was performed according to the departmental dose-optimization program which includes automated exposure control, adjustment of the mA and/or kV according to patient size and/or use of iterative reconstruction technique. COMPARISON:  MRI thoracic spine 02/03/22, CT lumbar spine 02/01/2022. FINDINGS: CT THORACIC SPINE FINDINGS Segmentation: There are 12 rib-bearing thoracic type segments. Alignment: Minimal to mild upper to midthoracic kyphodextroscoliosis is unchanged.  Trace degenerative anterolisthesis is again noted at T2-3 and T4-5 and up to 3 mm posterosuperior retropulsion noted at T11 and 12 due to a chronic T11 and acute T12 compression fractures. Vertebrae: Generalized osteopenia. There is an acute generalized compression fracture of the T12 vertebral body with intact pedicles and posterior elements and approximately 30% generalized vertebral height loss with 3 mm posterosuperior retropulsion. At T10 and T11 there are chronic compression fractures which were previously acute, with approximately 30% and 50% maximum vertebral height loss, respectively with no increase in compression since the prior study with interval kyphoplasty at both levels. Again noted and unchanged are mild chronic wedge compression fractures of T1, T2 and T5 and mild-to-moderate chronic compression fractures of T3 and T4. Paraspinal and other soft tissues:  There is dorsal paraspinous muscular atrophy which was seen previously. There is mucoid material and debris newly noted filling and obstructing the right lower lobe main and segmental bronchi with right lower lobe collapse. There are trace pleural effusions. Calcifications are partially visible in the thoracic aorta, coronary arteries and mitral ring. Moderate-sized hiatal hernia. Disc levels: The thoracic discs show multilevel degenerative disc height loss, spondylosis and facet spurring. There is no sizable disc herniation above T12 or cord compromise. Posterosuperior retropulsion at T11 and T12 flattens the ventral thecal sac without causing noteworthy spinal stenosis. At T12-L1, again noted is a left paracentral calcified disc extrusion extending cephalad in the epidural space posterior to the T12 body and partially effacing the ventral CSF. This however, was seen previously with no mass effect on the conus. The foramina are again moderately stenotic at T8-9, T9-10, T10-11, and T11-12. No other foraminal compromise. CT LUMBAR SPINE FINDINGS  Segmentation: 5 lumbar type vertebrae. Alignment: There is slight dextroscoliosis. Alignment is physiologic except for a 4 mm posterosuperior retropulsion of the L3 vertebral body cortex. Vertebrae: Osteopenia. There previously was a 25% upper plate anterior wedge compression fracture deformity of L3 which has worsened to approximately 50% loss of the anterior and central vertebral body height and 30% loss of the posterior vertebral body height since the prior study. There are intact anterior superior osteophytes of the L3 body suggesting a chronic or at least subacute process. There is no pedicle or posterior element fracture. The other lumbar vertebrae are normal in heights. No primary pathologic bone lesion is seen. The visualized sacrum and SI joints are intact, with SI joint spurring. Paraspinal and other soft tissues: Chronic fatty atrophy again noted in the dorsal paraspinous and bilateral iliopsoas musculature. Chronic scarring and cortical volume loss inferior pole right kidney. Aortic atherosclerosis without AAA. No mass or paraspinal hematoma. Disc levels: T12-L1: Disc degeneration and vacuum phenomenon with disc calcifications and a calcified left paracentral disc extrusion with cranial extension described above. Clear foramina. L1-2: Mild diffuse disc bulge without herniation or stenosis. Normal disc height. Clear foramina. Trace facet spurring. L2-3: Slight disc space loss. Posterosuperior L3 retropulsion causing moderate spinal canal stenosis along with hypertrophic ligamentous and facet disease. There is foraminal disc bulging, mild bilateral foraminal stenosis. L3-4: Normal disc height. Diffuse disc bulge, dorsal ligamentous and facet hypertrophy causing mild to moderate spinal canal stenosis. Foraminal disc bulging with mild foraminal stenosis. L4-5: Chronic disc collapse, vacuum phenomenon and circumferential disc osteophyte complex, along with dorsal ligamentous and facet hypertrophy causing  moderate acquired spinal canal stenosis. There is foraminal disc bulging and mild-to-moderate foraminal stenosis on the right. L5-S1: Chronic degenerative disc collapse and circumferential disc osteophyte complex. No herniation or canal stenosis. Facet hypertrophy with moderate to severe foraminal stenosis. IMPRESSION: 1. Acute generalized compression fracture of the T12 vertebral body with 30% height loss and 3 mm posterosuperior retropulsion. 2. Chronic compression fractures of T10 and T11 with interval kyphoplasty. 3. Chronic compression fractures of T1, T2, T3, T4. 4. Osteopenia and degenerative change thoracic and lumbar spine as detailed above. 5. Chronic T12-L1 left paracentral calcified disc extrusion with cranial extension. 6. Right lower lobe collapse with mucoid material and debris obstructing the right lower lobe main and segmental bronchi. 7. Trace pleural effusions. 8. Hiatal hernia. 9. Aortic and coronary artery atherosclerosis. 10. Since 02/01/2022 CT, increased compression fracture of L3, now with 50% loss of the anterior and central vertebral body height and 30% loss of the posterior vertebral body height  with Posterosuperior L3 retropulsion causing moderate spinal canal stenosis. Possible this could be acute on chronic injury, but I suspect it is probably subacute or chronic. Aortic Atherosclerosis (ICD10-I70.0). Electronically Signed   By: Telford Nab M.D.   On: 01/01/2023 20:44   CT Chest W Contrast  Result Date: 01/01/2023 CLINICAL DATA:  89 year old post trauma. EXAM: CT CHEST, ABDOMEN, AND PELVIS WITH CONTRAST TECHNIQUE: Multidetector CT imaging of the chest, abdomen and pelvis was performed following the standard protocol during bolus administration of intravenous contrast. RADIATION DOSE REDUCTION: This exam was performed according to the departmental dose-optimization program which includes automated exposure control, adjustment of the mA and/or kV according to patient size and/or  use of iterative reconstruction technique. CONTRAST:  135m OMNIPAQUE IOHEXOL 300 MG/ML  SOLN COMPARISON:  Radiographs earlier today. Chest CT 03/25/2010 FINDINGS: CT CHEST FINDINGS Cardiovascular: No evidence of acute aortic or vascular injury. Aortic atherosclerosis. Aortic valvular and mitral annulus calcifications. The heart is normal in size. There is no pericardial effusion. Mediastinum/Nodes: Scattered small mediastinal lymph nodes are not enlarged by size criteria. Prominent right hilar node is likely reactive. Moderate-sized hiatal hernia no pneumomediastinum. Lungs/Pleura: No pneumothorax. Rounded density in the right bronchus intermedius with complete low-density bronchial filling of the right lower lobe bronchi and complete right lower lobe collapse. There is a 2.9 cm fluid density structure within the area of atelectatic lung in the right lower lobe series 2, image 33. Subpleural reticulation and ground-glass in a peripheral and basilar predominant distribution. No significant pleural effusion. Musculoskeletal: Thoracic spine assessed on concurrent thoracic spine reformats, reported separately. No acute fracture of the ribs, sternum or included clavicles and shoulder girdles. CT ABDOMEN PELVIS FINDINGS Hepatobiliary: No hepatic injury or perihepatic hematoma. Mild hepatic steatosis. Gallbladder is unremarkable. Pancreas: No evidence of injury. No ductal dilatation or inflammation. Spleen: No splenic injury or perisplenic hematoma. Adrenals/Urinary Tract: No adrenal hemorrhage or renal injury identified. Atrophy and cortical scarring in the lower pole of the right kidney may represent sequela of prior renal infarct. Bladder is unremarkable. Stomach/Bowel: There is no evidence of bowel injury or mesenteric hematoma. No bowel inflammation or obstruction. Moderate-sized hiatal hernia. Vascular/Lymphatic: No evidence of vascular injury. Prominent aortic atherosclerosis without aneurysm. No retroperitoneal  fluid. No gross adenopathy. Reproductive: Status post hysterectomy. No adnexal masses. Other: No free air or free fluid. Musculoskeletal: Lumbar spine assessed on concurrent lumbar spine reformats, reported separately. Bilateral hip arthroplasties. There is no evidence of pelvic fracture. IMPRESSION: 1. No evidence of acute traumatic injury to the chest, abdomen, or pelvis. 2. Reference thoracic and lumbar spine reformats for spinal evaluation. 3. Rounded density in the right bronchus intermedius with complete low-density bronchial filling of the right lower lobe bronchi and complete right lower lobe collapse. Acuity is uncertain but favored to be chronic. There is a 2.9 cm fluid density structure within the area of atelectatic lung in the right lower lobe. This is nonspecific. Both neoplastic or infectious etiologies are considered. Recommend bronchoscopy for further evaluation. 4. Subpleural reticulation and ground-glass in a peripheral and basilar predominant distribution, suspicious for interstitial lung disease. 5. Incidental findings in the abdomen and pelvis of hepatic steatosis and right renal scarring. Moderate-sized hiatal hernia. Aortic Atherosclerosis (ICD10-I70.0). Electronically Signed   By: MKeith RakeM.D.   On: 01/01/2023 20:05   CT ABDOMEN PELVIS W CONTRAST  Result Date: 01/01/2023 CLINICAL DATA:  89year old post trauma. EXAM: CT CHEST, ABDOMEN, AND PELVIS WITH CONTRAST TECHNIQUE: Multidetector CT imaging of the chest, abdomen and  pelvis was performed following the standard protocol during bolus administration of intravenous contrast. RADIATION DOSE REDUCTION: This exam was performed according to the departmental dose-optimization program which includes automated exposure control, adjustment of the mA and/or kV according to patient size and/or use of iterative reconstruction technique. CONTRAST:  119m OMNIPAQUE IOHEXOL 300 MG/ML  SOLN COMPARISON:  Radiographs earlier today. Chest CT  03/25/2010 FINDINGS: CT CHEST FINDINGS Cardiovascular: No evidence of acute aortic or vascular injury. Aortic atherosclerosis. Aortic valvular and mitral annulus calcifications. The heart is normal in size. There is no pericardial effusion. Mediastinum/Nodes: Scattered small mediastinal lymph nodes are not enlarged by size criteria. Prominent right hilar node is likely reactive. Moderate-sized hiatal hernia no pneumomediastinum. Lungs/Pleura: No pneumothorax. Rounded density in the right bronchus intermedius with complete low-density bronchial filling of the right lower lobe bronchi and complete right lower lobe collapse. There is a 2.9 cm fluid density structure within the area of atelectatic lung in the right lower lobe series 2, image 33. Subpleural reticulation and ground-glass in a peripheral and basilar predominant distribution. No significant pleural effusion. Musculoskeletal: Thoracic spine assessed on concurrent thoracic spine reformats, reported separately. No acute fracture of the ribs, sternum or included clavicles and shoulder girdles. CT ABDOMEN PELVIS FINDINGS Hepatobiliary: No hepatic injury or perihepatic hematoma. Mild hepatic steatosis. Gallbladder is unremarkable. Pancreas: No evidence of injury. No ductal dilatation or inflammation. Spleen: No splenic injury or perisplenic hematoma. Adrenals/Urinary Tract: No adrenal hemorrhage or renal injury identified. Atrophy and cortical scarring in the lower pole of the right kidney may represent sequela of prior renal infarct. Bladder is unremarkable. Stomach/Bowel: There is no evidence of bowel injury or mesenteric hematoma. No bowel inflammation or obstruction. Moderate-sized hiatal hernia. Vascular/Lymphatic: No evidence of vascular injury. Prominent aortic atherosclerosis without aneurysm. No retroperitoneal fluid. No gross adenopathy. Reproductive: Status post hysterectomy. No adnexal masses. Other: No free air or free fluid. Musculoskeletal: Lumbar  spine assessed on concurrent lumbar spine reformats, reported separately. Bilateral hip arthroplasties. There is no evidence of pelvic fracture. IMPRESSION: 1. No evidence of acute traumatic injury to the chest, abdomen, or pelvis. 2. Reference thoracic and lumbar spine reformats for spinal evaluation. 3. Rounded density in the right bronchus intermedius with complete low-density bronchial filling of the right lower lobe bronchi and complete right lower lobe collapse. Acuity is uncertain but favored to be chronic. There is a 2.9 cm fluid density structure within the area of atelectatic lung in the right lower lobe. This is nonspecific. Both neoplastic or infectious etiologies are considered. Recommend bronchoscopy for further evaluation. 4. Subpleural reticulation and ground-glass in a peripheral and basilar predominant distribution, suspicious for interstitial lung disease. 5. Incidental findings in the abdomen and pelvis of hepatic steatosis and right renal scarring. Moderate-sized hiatal hernia. Aortic Atherosclerosis (ICD10-I70.0). Electronically Signed   By: MKeith RakeM.D.   On: 01/01/2023 20:05   CT Head Wo Contrast  Result Date: 01/01/2023 CLINICAL DATA:  Head trauma, minor (Age >= 65y); Neck trauma (Age >= 65y) EXAM: CT HEAD WITHOUT CONTRAST CT CERVICAL SPINE WITHOUT CONTRAST TECHNIQUE: Multidetector CT imaging of the head and cervical spine was performed following the standard protocol without intravenous contrast. Multiplanar CT image reconstructions of the cervical spine were also generated. RADIATION DOSE REDUCTION: This exam was performed according to the departmental dose-optimization program which includes automated exposure control, adjustment of the mA and/or kV according to patient size and/or use of iterative reconstruction technique. COMPARISON:  02/01/2022 FINDINGS: CT HEAD FINDINGS Brain: No evidence of acute  infarction, hemorrhage, hydrocephalus, extra-axial collection or mass  lesion/mass effect. Extensive low-density changes within the periventricular and subcortical white matter most compatible with chronic microvascular ischemic change. Moderate diffuse cerebral volume loss. Vascular: Atherosclerotic calcifications involving the large vessels of the skull base. No unexpected hyperdense vessel. Skull: Normal. Negative for fracture or focal lesion. Sinuses/Orbits: No acute finding. Other: None. CT CERVICAL SPINE FINDINGS Alignment: Facet joints are aligned without dislocation or traumatic listhesis. Dens and lateral masses are aligned. Skull base and vertebrae: No acute fracture. No primary bone lesion or focal pathologic process. Soft tissues and spinal canal: No prevertebral fluid or swelling. No visible canal hematoma. Disc levels: Advanced facet-predominant multilevel cervical spondylosis. Upper chest: Negative. Other: Bilateral carotid atherosclerosis. IMPRESSION: 1. No acute intracranial abnormality. 2. No acute fracture or subluxation of the cervical spine. Electronically Signed   By: Davina Poke D.O.   On: 01/01/2023 16:27   CT Cervical Spine Wo Contrast  Result Date: 01/01/2023 CLINICAL DATA:  Head trauma, minor (Age >= 65y); Neck trauma (Age >= 65y) EXAM: CT HEAD WITHOUT CONTRAST CT CERVICAL SPINE WITHOUT CONTRAST TECHNIQUE: Multidetector CT imaging of the head and cervical spine was performed following the standard protocol without intravenous contrast. Multiplanar CT image reconstructions of the cervical spine were also generated. RADIATION DOSE REDUCTION: This exam was performed according to the departmental dose-optimization program which includes automated exposure control, adjustment of the mA and/or kV according to patient size and/or use of iterative reconstruction technique. COMPARISON:  02/01/2022 FINDINGS: CT HEAD FINDINGS Brain: No evidence of acute infarction, hemorrhage, hydrocephalus, extra-axial collection or mass lesion/mass effect. Extensive  low-density changes within the periventricular and subcortical white matter most compatible with chronic microvascular ischemic change. Moderate diffuse cerebral volume loss. Vascular: Atherosclerotic calcifications involving the large vessels of the skull base. No unexpected hyperdense vessel. Skull: Normal. Negative for fracture or focal lesion. Sinuses/Orbits: No acute finding. Other: None. CT CERVICAL SPINE FINDINGS Alignment: Facet joints are aligned without dislocation or traumatic listhesis. Dens and lateral masses are aligned. Skull base and vertebrae: No acute fracture. No primary bone lesion or focal pathologic process. Soft tissues and spinal canal: No prevertebral fluid or swelling. No visible canal hematoma. Disc levels: Advanced facet-predominant multilevel cervical spondylosis. Upper chest: Negative. Other: Bilateral carotid atherosclerosis. IMPRESSION: 1. No acute intracranial abnormality. 2. No acute fracture or subluxation of the cervical spine. Electronically Signed   By: Davina Poke D.O.   On: 01/01/2023 16:27   DG Lumbar Spine Complete  Result Date: 01/01/2023 CLINICAL DATA:  Fall with back pain. EXAM: LUMBAR SPINE - COMPLETE 4+ VIEW COMPARISON:  CT of the lumbar spine peripheral armed on March 06/26/2022 FINDINGS: Osteopenia.  Spinal degenerative changes. Signs of cement augmentation in the lower thoracic spine. Five lumbar type vertebral bodies. Similar appearance of slight increased loss of height suspected also at L1 since imaging from February 01, 2022. Marked narrowing of the disc space at L4-5 and L5-S1 with facet hypertrophy at these levels as well. Signs of interval at least 40% loss of height of the T12 vertebral body. Subtle lucency across the posterior cortex and mild retropulsion suspected. Difficult to exclude pedicle involvement. No gross change in alignment. T10 and T11 cement augmentation since prior imaging. IMPRESSION: 1. Signs of interval at least 40% loss of height of  the T12 vertebral body. Subtle lucency across the posterior cortex and mild retropulsion suspected. Difficult to exclude pedicle involvement. CT may be helpful for further evaluation. 2. Suspect slight interval loss of height also  at L3 where there was of compression fracture seen on previous image. Unclear whether this is acute or represents changes related to healing and interval change since February 01, 2022. Correlate with any signs of point tenderness in this location. 3. Signs of cement augmentation of vertebral bodies in the lower thoracic spine at T10 and T11. These results were called by telephone at the time of interpretation on 01/01/2023 at 3:59 pm to provider [facility] , who verbally acknowledged these results. Electronically Signed   By: Zetta Bills M.D.   On: 01/01/2023 15:59   DG Pelvis 1-2 Views  Result Date: 01/01/2023 CLINICAL DATA:  Fall with back pain in a 89 year old female. EXAM: PELVIS - 1-2 VIEW COMPARISON:  Lumbar spine evaluation of the same date. Prior pelvic evaluation from March of 2023 FINDINGS: Post bilateral hip arthroplasty. Femoral component of arthroplasty is are incompletely imaged. There is no sign of acute pelvic process in this osteopenic patient. Incidental note is made of degenerative changes in the lumbar spine. IMPRESSION: 1. Post bilateral hip arthroplasty without signs of acute fracture. 2. Degenerative changes in the lumbar spine. 3. Femoral components of bilateral hip arthroplasty changes are incompletely assessed. Electronically Signed   By: Zetta Bills M.D.   On: 01/01/2023 15:50   DG Chest 2 View  Result Date: 01/01/2023 CLINICAL DATA:  Fall, back pain. EXAM: CHEST - 2 VIEW COMPARISON:  Chest x-ray dated 02/01/2022. FINDINGS: Vague opacity within the RIGHT lower lung, possibly layering pleural effusion. LEFT lung appears clear. No pneumothorax is seen. Heart size and mediastinal contours appear stable. Interval changes of kyphoplasty/vertebroplasty at  the level of the lower thoracic spine, involving at least 2 adjacent vertebral bodies. There is an additional compression fracture deformity at the thoracolumbar junction, of uncertain chronicity. IMPRESSION: 1. Vague opacity within the RIGHT lower lung, possibly layering pleural effusion. Posttraumatic hemothorax not excluded. Neoplastic mass also not excluded. Recommend chest CT for further characterization. 2. Interval changes of kyphoplasty/vertebroplasty at the level of the lower thoracic spine, involving at least 2 adjacent vertebral bodies. 3. There is an additional compression fracture deformity at the thoracolumbar junction, of uncertain chronicity. This can also be further characterized on the chest CT recommended above (ask technologist to make sure that the upper lumbar spine is included). Electronically Signed   By: Franki Cabot M.D.   On: 01/01/2023 15:44    Procedures Procedures    Medications Ordered in ED Medications  0.9 %  sodium chloride infusion ( Intravenous New Bag/Given 01/01/23 1749)  iohexol (OMNIPAQUE) 300 MG/ML solution 100 mL (100 mLs Intravenous Contrast Given 01/01/23 1855)    ED Course/ Medical Decision Making/ A&P                             Medical Decision Making Amount and/or Complexity of Data Reviewed Labs: ordered. Radiology: ordered.  Risk Prescription drug management.    Medical Screen Complete  This patient presented to the ED with complaint of fall, back pain.  This complaint involves an extensive number of treatment options. The initial differential diagnosis includes, but is not limited to, trauma from fall, metabolic abnormality, etc.  This presentation is: Acute, Chronic, Self-Limited, Previously Undiagnosed, Uncertain Prognosis, Complicated, Systemic Symptoms, and Threat to Life/Bodily Function  Patient with history of A-fib on Coumadin, COPD, hypertension heart disease, presents after apparent falls yesterday.  Patient with multiple  also today.  Patient with another fall in the evening where  the patient was unable to stand until being found this a.m.  Patient complains of significant new acute low back pain.  Patient is on Coumadin for history of A-fib.  Patient with notable lab abnormalities including INR 5.6, CK of 1993, potassium of 5.3, creatinine is 1.04.  Given coagulopathy extensive imaging obtained.  Patient with acute T12 compression fracture found.  Patient would benefit from admission for further workup and treatment.  Hospitalist service made aware of case and evaluate for same.  Additional history obtained:  External records from outside sources obtained and reviewed including prior ED visits and prior Inpatient records.    Lab Tests:  I ordered and personally interpreted labs.  The pertinent results include: CBC, CK, CMP, INR, troponin, UA   Imaging Studies ordered:  I ordered imaging studies including CT head, CT C-spine, CT I independently visualized and interpreted obtained imaging which showed acute T12 compression fracture I agree with the radiologist interpretation.   Cardiac Monitoring:  The patient was maintained on a cardiac monitor.  I personally viewed and interpreted the cardiac monitor which showed an underlying rhythm of: NSR   Problem List / ED Course:  Fall, elevated CK, acute T12 compression fracture   Reevaluation:  After the interventions noted above, I reevaluated the patient and found that they have: stayed the same  Disposition:  After consideration of the diagnostic results and the patients response to treatment, I feel that the patent would benefit from admission.          Final Clinical Impression(s) / ED Diagnoses Final diagnoses:  Fall, initial encounter  Compression fracture of T12 vertebra, initial encounter [facility])    Rx / DC Orders ED Discharge Orders     None         Valarie Merino, MD 01/01/23 2221

## 2023-01-01 NOTE — ED Notes (Signed)
Pt taken to XR.  

## 2023-01-02 DIAGNOSIS — R296 Repeated falls: Secondary | ICD-10-CM | POA: Diagnosis present

## 2023-01-02 DIAGNOSIS — I272 Pulmonary hypertension, unspecified: Secondary | ICD-10-CM | POA: Diagnosis present

## 2023-01-02 DIAGNOSIS — I10 Essential (primary) hypertension: Secondary | ICD-10-CM | POA: Diagnosis not present

## 2023-01-02 DIAGNOSIS — J4489 Other specified chronic obstructive pulmonary disease: Secondary | ICD-10-CM | POA: Diagnosis present

## 2023-01-02 DIAGNOSIS — I739 Peripheral vascular disease, unspecified: Secondary | ICD-10-CM | POA: Diagnosis present

## 2023-01-02 DIAGNOSIS — Z1152 Encounter for screening for COVID-19: Secondary | ICD-10-CM | POA: Diagnosis not present

## 2023-01-02 DIAGNOSIS — R7401 Elevation of levels of liver transaminase levels: Secondary | ICD-10-CM

## 2023-01-02 DIAGNOSIS — M4856XA Collapsed vertebra, not elsewhere classified, lumbar region, initial encounter for fracture: Secondary | ICD-10-CM | POA: Diagnosis present

## 2023-01-02 DIAGNOSIS — K219 Gastro-esophageal reflux disease without esophagitis: Secondary | ICD-10-CM | POA: Diagnosis present

## 2023-01-02 DIAGNOSIS — Z66 Do not resuscitate: Secondary | ICD-10-CM | POA: Diagnosis present

## 2023-01-02 DIAGNOSIS — E8809 Other disorders of plasma-protein metabolism, not elsewhere classified: Secondary | ICD-10-CM | POA: Diagnosis present

## 2023-01-02 DIAGNOSIS — I110 Hypertensive heart disease with heart failure: Secondary | ICD-10-CM | POA: Diagnosis present

## 2023-01-02 DIAGNOSIS — F32A Depression, unspecified: Secondary | ICD-10-CM | POA: Diagnosis present

## 2023-01-02 DIAGNOSIS — I5032 Chronic diastolic (congestive) heart failure: Secondary | ICD-10-CM | POA: Diagnosis present

## 2023-01-02 DIAGNOSIS — M4854XA Collapsed vertebra, not elsewhere classified, thoracic region, initial encounter for fracture: Secondary | ICD-10-CM | POA: Diagnosis present

## 2023-01-02 DIAGNOSIS — M6282 Rhabdomyolysis: Secondary | ICD-10-CM

## 2023-01-02 DIAGNOSIS — I4821 Permanent atrial fibrillation: Secondary | ICD-10-CM | POA: Diagnosis present

## 2023-01-02 DIAGNOSIS — D649 Anemia, unspecified: Secondary | ICD-10-CM | POA: Diagnosis present

## 2023-01-02 DIAGNOSIS — E871 Hypo-osmolality and hyponatremia: Secondary | ICD-10-CM | POA: Diagnosis present

## 2023-01-02 DIAGNOSIS — R918 Other nonspecific abnormal finding of lung field: Secondary | ICD-10-CM | POA: Diagnosis not present

## 2023-01-02 DIAGNOSIS — E7800 Pure hypercholesterolemia, unspecified: Secondary | ICD-10-CM | POA: Diagnosis present

## 2023-01-02 DIAGNOSIS — R791 Abnormal coagulation profile: Secondary | ICD-10-CM | POA: Diagnosis not present

## 2023-01-02 DIAGNOSIS — J9811 Atelectasis: Secondary | ICD-10-CM | POA: Diagnosis present

## 2023-01-02 DIAGNOSIS — I2510 Atherosclerotic heart disease of native coronary artery without angina pectoris: Secondary | ICD-10-CM | POA: Diagnosis not present

## 2023-01-02 DIAGNOSIS — W010XXA Fall on same level from slipping, tripping and stumbling without subsequent striking against object, initial encounter: Secondary | ICD-10-CM | POA: Diagnosis present

## 2023-01-02 DIAGNOSIS — I081 Rheumatic disorders of both mitral and tricuspid valves: Secondary | ICD-10-CM | POA: Diagnosis present

## 2023-01-02 DIAGNOSIS — J449 Chronic obstructive pulmonary disease, unspecified: Secondary | ICD-10-CM | POA: Diagnosis not present

## 2023-01-02 DIAGNOSIS — K760 Fatty (change of) liver, not elsewhere classified: Secondary | ICD-10-CM | POA: Diagnosis present

## 2023-01-02 DIAGNOSIS — G629 Polyneuropathy, unspecified: Secondary | ICD-10-CM | POA: Diagnosis present

## 2023-01-02 DIAGNOSIS — Y92000 Kitchen of unspecified non-institutional (private) residence as  the place of occurrence of the external cause: Secondary | ICD-10-CM | POA: Diagnosis not present

## 2023-01-02 DIAGNOSIS — E875 Hyperkalemia: Secondary | ICD-10-CM | POA: Diagnosis present

## 2023-01-02 LAB — COMPREHENSIVE METABOLIC PANEL
ALT: 59 U/L — ABNORMAL HIGH (ref 0–44)
AST: 82 U/L — ABNORMAL HIGH (ref 15–41)
Albumin: 2.7 g/dL — ABNORMAL LOW (ref 3.5–5.0)
Alkaline Phosphatase: 78 U/L (ref 38–126)
Anion gap: 7 (ref 5–15)
BUN: 21 mg/dL (ref 8–23)
CO2: 22 mmol/L (ref 22–32)
Calcium: 9.4 mg/dL (ref 8.9–10.3)
Chloride: 102 mmol/L (ref 98–111)
Creatinine, Ser: 0.83 mg/dL (ref 0.44–1.00)
GFR, Estimated: >60 mL/min (ref >60)
Glucose, Bld: 92 mg/dL (ref 70–99)
Potassium: 4.5 mmol/L (ref 3.5–5.1)
Sodium: 131 mmol/L — ABNORMAL LOW (ref 135–145)
Total Bilirubin: 0.7 mg/dL (ref 0.3–1.2)
Total Protein: 6.7 g/dL (ref 6.5–8.1)

## 2023-01-02 LAB — CBC
HCT: 36.9 % (ref 36.0–46.0)
Hemoglobin: 11.1 g/dL — ABNORMAL LOW (ref 12.0–15.0)
MCH: 25.6 pg — ABNORMAL LOW (ref 26.0–34.0)
MCHC: 30.1 g/dL (ref 30.0–36.0)
MCV: 85.2 fL (ref 80.0–100.0)
Platelets: 331 10*3/uL (ref 150–400)
RBC: 4.33 MIL/uL (ref 3.87–5.11)
RDW: 17.3 % — ABNORMAL HIGH (ref 11.5–15.5)
WBC: 9.8 10*3/uL (ref 4.0–10.5)
nRBC: 0 % (ref 0.0–0.2)

## 2023-01-02 LAB — CK: Total CK: 968 U/L — ABNORMAL HIGH (ref 38–234)

## 2023-01-02 LAB — PROTIME-INR
INR: 5.7 (ref 0.8–1.2)
Prothrombin Time: 50.8 seconds — ABNORMAL HIGH (ref 11.4–15.2)

## 2023-01-02 MED ORDER — SODIUM CHLORIDE 0.9 % IV SOLN: INTRAVENOUS | Status: AC

## 2023-01-02 NOTE — Plan of Care (Signed)
  Problem: Education: Goal: Knowledge of General Education information will improve Description Including pain rating scale, medication(s)/side effects and non-pharmacologic comfort measures Outcome: Progressing   Problem: Health Behavior/Discharge Planning: Goal: Ability to manage health-related needs will improve Outcome: Progressing   

## 2023-01-02 NOTE — Progress Notes (Signed)
   01/02/23 0444  Provider Notification  Provider Name/Title J. Olena Heckle NP  Date Provider Notified 01/02/23  Time Provider Notified [phone]  Method of Notification Page  Notification Reason Critical Result  Test performed and critical result INR 5.7  Date Critical Result Received 01/02/23  Time Critical Result Received 0430  Provider response Other (Comment) (waiting for response, provider aware)  Date of Provider Response 01/02/23  Time of Provider Response [phone]

## 2023-01-02 NOTE — Progress Notes (Signed)
OT Cancellation Note  Patient Details Name: [name] MRN: [id number] DOB: 26-Dec-1931   Cancelled Treatment:    Reason Eval/Treat Not Completed: Patient not medically ready Patient with INR of 5.7 with high falls risk history. OT to continue to follow and check back as schedule will allow.  Rennie Plowman, MS Acute Rehabilitation Department Office# [phone]  01/02/2023, 3:17 PM

## 2023-01-02 NOTE — Progress Notes (Signed)
PROGRESS NOTE    [name]  [id number] DOB: 01-Jun-1932 DOA: 01/01/2023 PCP: [name], MD (Inactive)   Brief Narrative:  No notes on file the patient is an 89 year old elderly Caucasian female with past medical history significant for but #2 CAD, permanent atrial fibrillation on anticoagulation with Coumadin, mild to moderate MR, mild to moderate TR, hypertension, LVH, hyperlipidemia, pulmonary hypertension, arthritis, chronic back pain, depression, COPD and chronic bronchitis, PAD, GERD, prediabetes, urge incontinence, macular degeneration, B12 deficiency as well as other comorbidities who presents to the ED via EMS after a fall around the night before last.  She reported had been crawling around on the floor and complained of soreness in her legs and low back.  On arrival to the ED she is awake and alert and oriented x 3 and answers all questions appropriately.  She underwent further lab work and testing.  Head CT and C-spine were negative for any acute findings.  CT of the chest was negative for any acute trauma but did show a rounded density in the right bronchus intermedius with complete density bronchial filling of the right lower lobe bronchi and complete right lower lobe collapse.  The acuity was unclear but it failed to be chronic.  The recommendation is for bronchoscopy but given that she had a supra therapeutic INR we will hold off on pulmonary consultation until her INR starts drifting downward.  CT scan of the thoracic spine showed acute generalized compression fracture T12 with vertebral body 30% height loss and 3 mm posterior superior retropulsion.  There is also noted to have chronic compression fractures of T10 and T8 along with interval kyphoplasty.  She is also noted to have chronic compression fractures of T1, T2, T3 and T4 as well as chronic T12-L1 left paracentral calcified disc extrusion with cranial extension.  A CT scan of the lumbar spine was done and  showed increased compression fracture of L3 now with 50% of anterior and central vertebral body height loss and 30% loss of the posterior vertebral body height with posterior superior L3 retropulsion causing moderate spinal canal stenosis.  This is felt to be possibly acute on chronic injury but the radiologist suspects subacute on chronic.  She normally ambulates with a walker but has chronic issues due to her balance due to peripheral neuropathy.  She continues to live alone and has frequent falls and had another fall and she was in the kitchen when she fell and slipped on her buttocks.  She presented for this and was admitted for further workup and PT OT recommending SNF.  She is currently getting IV fluid hydration for rhabdomyolysis and will consult pulmonary for her CT scan findings once her INR dropped down.  Assessment and Plan: No notes have been filed under this hospital service. Service: Hospitalist  Recurrent falls -Appear to be related to chronic issues with balance in the setting of peripheral neuropathy, uses a walker to ambulate.   -She had another fall the other night which appears to be mechanical based on history. -Patient denies head injury or loss of consciousness.   -Denies preceding lightheadedness/dizziness, chest pain, shortness of breath.   -She lives alone and it is not safe for her to return home given recurrent falls and the fact that she is on chronic anticoagulation for A-fib.  - PT/OT consulted, fall precautions.  TOC consulted to help with placement.   Permanent A-fib on Coumadin with supratherapeutic INR -INR supratherapeutic at 5.6 and repeat 5.7.   -Patient reports  increase in Coumadin dose a few months ago but reportedly had INR checked a month ago and it was within therapeutic range.   -She denies any changes to her diet or taking any extra doses of Coumadin.   -Given recurrent falls, risk versus benefit of chronic anticoagulation needs to be discussed with  the patient and her daughters.   -Continue Hold Coumadin at this time and repeat PT/INR in the morning.  - No evidence of bleeding on trauma scans.  Hemoglobin is stable.  A-fib currently rate controlled, continue Cardizem.   Rhabdomyolysis -In the setting of recent fall.  CK 1993 and is now improving and 968.   -Creatinine is at baseline.  Give gentle IV Fluid hydration and repeat CK in a.m.   Mild hyperkalemia -Patient's K+ Level Trend: Recent Labs  Lab 01/01/23 1505 01/02/23 0318  K 5.3* 4.5  -In the setting of rhabdomyolysis -Getting IV fluid hydration and will need to continue monitor and hold lisinopril for this time. -Repeat CMP in the AM    Elevated transaminases/Abnormal LFTs -LFT/Hepatic Function Panel Trend Recent Labs  Lab 01/01/23 1505 01/02/23 0318  AST 121* 82*  ALT 73* 59*  BILITOT 0.7 0.7  ALKPHOS 100 78  -Patient is not endorsing any GI symptoms.  CT showing hepatic steatosis but LFTs previously normal.   -Rhabdomyolysis likely contributing.  Repeat LFTs in AM.  Avoid hepatotoxic agents.   Acute T12 vertebral compression fracture Multilevel chronic vertebral compression fractures -CT thoracic spine showing acute generalized compression fracture of the T12 vertebral body with 30% height loss and 3 mm posterosuperior retropulsion.  Also showing chronic compression fractures of T10 and T11 with interval kyphoplasty.   -Chronic compression fractures of T1, T2, T3, and T4.  Chronic T12-L1 left paracentral calcified disc extrusion with cranial extension.  -CT of lumbar spine showing increased compression fracture of L3, now with 50% loss of anterior and central ventral body height and 30% loss of the posterior vertebral body height with posterosuperior L3 retropulsion causing moderate spinal canal stenosis.  Possible acute on chronic injury but radiologist suspects it is probably subacute or chronic.   -Patient is not endorsing any back pain at this time.  No  neurologic deficit.   -Continue pain management as needed and outpatient follow-up with her spine surgeon.  -PT/OT eval, fall precautions.   Chronic right lung lower lobe collapse secondary to obstruction -CT chest showing rounded density in the right bronchus intermedius with complete low-density bronchial filling of the right lower lobe bronchi and complete right lower lobe collapse.  Acuity is uncertain but favored to be chronic.   -There is a 2.9 cm fluid density structure within the area of atelectatic lung in the right lower lobe.  This is nonspecific.  - Both neoplastic or infectious etiologies are considered.   -Patient will need pulmonology evaluation for bronchoscopy once INR improves given that it still remains supratherapeutic.   CAD Troponin 18> 17 and patient is not endorsing chest pain.  Continue cardiac monitoring.   Hypertension -Blood pressure stable.  Continue diltiazem 120 p.o. daily -Continue monitor blood pressures per protocol -Last blood pressure reading was 121/77  Hyponatremia -Mild. Na+ went from 135 -> 131 -C/w IVF hydration as above -Repeat CMP in the AM    Hyperlipidemia -Hold statin at this time in the setting of rhabdomyolysis/elevated liver enzymes.   Depression -Continue with sertraline 100 mg p.o. daily   COPD/chronic bronchitis -Stable, no wheezing or shortness of breath.  DuoNeb as  needed every 6.   GERD -Continue PPI with pantoprazole 40 g p.o. daily.   Prediabetes -A1c 6.4 on 10/04/2022. Will repeat here -Continue to Monitor Blood Sugars per Protocol and monitor closely and if necessary will place on sensitive NovoLog/scale insulin AC   Peripheral neuropathy -Continue gabapentin.  Hypoalbuminemia -Patient's Albumin Trend: Recent Labs  Lab 01/01/23 1505 01/02/23 0318  ALBUMIN 3.1* 2.7*  -Continue to Monitor and Trend and repeat CMP in the AM  Normocytic anemia --Hgb/Hct Trend: Recent Labs  Lab 01/01/23 1505 01/02/23 0318   HGB 13.6 11.1*  HCT 44.8 36.9  MCV 85.5 85.2  -Check Anemia Panel in the AM -Continue to Monitor for S/Sx of Bleeding; No overt bleeding noted -repeat CBC in the AM  DVT prophylaxis: SCDs and holding Coumadin    Code Status: DNR Family Communication: No family at bedside  Disposition Plan:  Level of care: Telemetry Status is: Observation The patient will require care spanning > 2 midnights and should be moved to inpatient because: Given that she has recurrent falls and PT OT recommending significant.  She needs to be stabilized prior to discharging   Consultants:  None  Procedures:  As delineated as above  Antimicrobials:  Anti-infectives (From admission, onward)    None       Subjective: Seen and examined at bedside and she states that she was sore.  Denies any nausea or vomiting.  Feels okay.  No evidence of bleeding.  No other concerns or plaints at this time.  Objective: Vitals:   01/01/23 2235 01/01/23 2330 01/02/23 0351 01/02/23 0655  BP:  138/76 129/82 125/70  Pulse:  96 (!) 104 98  Resp:  (!) '21 16 16  '$ Temp:  98.1 F (36.7 C) 98.8 F (37.1 C) 98.5 F (36.9 C)  TempSrc:  Oral    SpO2:  (!) 89% 94% 96%  Weight: 64.4 kg     Height: '5\' 4"'$  (1.626 m)       Intake/Output Summary (Last 24 hours) at 01/02/2023 [id number] Last data filed at 01/02/2023 0600 Gross per 24 hour  Intake 831.24 ml  Output 300 ml  Net 531.24 ml   Filed Weights   01/01/23 2235  Weight: 64.4 kg   Examination: Physical Exam:  Constitutional: Thin Caucasian female in no acute distress Respiratory: Diminished to auscultation bilaterally, no wheezing, rales, rhonchi or crackles. Normal respiratory effort and patient is not tachypenic. No accessory muscle use.  Unlabored breathing Cardiovascular: RRR, no murmurs / rubs / gallops. S1 and S2 auscultated.  Trace extremity edema Abdomen: Soft, non-tender, non-distended.. Bowel sounds positive.  GU: Deferred. Musculoskeletal: No clubbing /  cyanosis of digits/nails. No joint deformity upper and lower extremities.  Skin: No rashes, lesions, ulcers limited skin evaluation. No induration; Warm and dry.  Neurologic: CN 2-12 grossly intact with no focal deficits. Romberg sign and cerebellar reflexes not assessed.  Psychiatric: Normal judgment and insight. Alert and oriented x 3. Normal mood and appropriate affect.   Data Reviewed: I have personally reviewed following labs and imaging studies  CBC: Recent Labs  Lab 01/01/23 1505 01/02/23 0318  WBC 9.7 9.8  NEUTROABS 8.4*  --   HGB 13.6 11.1*  HCT 44.8 36.9  MCV 85.5 85.2  PLT 378 [id number]   Basic Metabolic Panel: Recent Labs  Lab 01/01/23 1505 01/02/23 0318  NA 135 131*  K 5.3* 4.5  CL 103 102  CO2 25 22  GLUCOSE 110* 92  BUN 26* 21  CREATININE 1.04* 0.83  CALCIUM 9.9 9.4   GFR: Estimated Creatinine Clearance: 40.5 mL/min (by C-G formula based on SCr of 0.83 mg/dL). Liver Function Tests: Recent Labs  Lab 01/01/23 1505 01/02/23 0318  AST 121* 82*  ALT 73* 59*  ALKPHOS 100 78  BILITOT 0.7 0.7  PROT 8.2* 6.7  ALBUMIN 3.1* 2.7*   No results for input(s): "LIPASE", "AMYLASE" in the last 168 hours. No results for input(s): "AMMONIA" in the last 168 hours. Coagulation Profile: Recent Labs  Lab 01/01/23 1648 01/02/23 0318  INR 5.6* 5.7*   Cardiac Enzymes: Recent Labs  Lab 01/01/23 1505 01/02/23 0318  CKTOTAL 1,993* 968*   BNP (last 3 results) No results for input(s): "PROBNP" in the last 8760 hours. HbA1C: No results for input(s): "HGBA1C" in the last 72 hours. CBG: No results for input(s): "GLUCAP" in the last 168 hours. Lipid Profile: No results for input(s): "CHOL", "HDL", "LDLCALC", "TRIG", "CHOLHDL", "LDLDIRECT" in the last 72 hours. Thyroid Function Tests: No results for input(s): "TSH", "T4TOTAL", "FREET4", "T3FREE", "THYROIDAB" in the last 72 hours. Anemia Panel: No results for input(s): "VITAMINB12", "FOLATE", "FERRITIN", "TIBC", "IRON",  "RETICCTPCT" in the last 72 hours. Sepsis Labs: No results for input(s): "PROCALCITON", "LATICACIDVEN" in the last 168 hours.  No results found for this or any previous visit (from the past 240 hour(s)).   Radiology Studies: CT Thoracic Spine Wo Contrast  Result Date: 01/01/2023 CLINICAL DATA:  Fall injury with back trauma.  Barium EXAM: CT  THORACIC AND LUMBAR SPINE WITHOUT CONTRAST TECHNIQUE: Multidetector CT imaging of the thoracic and lumbar spine was performed without intravenous contrast. Multiplanar CT image reconstructions were also generated. RADIATION DOSE REDUCTION: This exam was performed according to the departmental dose-optimization program which includes automated exposure control, adjustment of the mA and/or kV according to patient size and/or use of iterative reconstruction technique. COMPARISON:  MRI thoracic spine 02/03/22, CT lumbar spine 02/01/2022. FINDINGS: CT THORACIC SPINE FINDINGS Segmentation: There are 12 rib-bearing thoracic type segments. Alignment: Minimal to mild upper to midthoracic kyphodextroscoliosis is unchanged. Trace degenerative anterolisthesis is again noted at T2-3 and T4-5 and up to 3 mm posterosuperior retropulsion noted at T11 and 12 due to a chronic T11 and acute T12 compression fractures. Vertebrae: Generalized osteopenia. There is an acute generalized compression fracture of the T12 vertebral body with intact pedicles and posterior elements and approximately 30% generalized vertebral height loss with 3 mm posterosuperior retropulsion. At T10 and T11 there are chronic compression fractures which were previously acute, with approximately 30% and 50% maximum vertebral height loss, respectively with no increase in compression since the prior study with interval kyphoplasty at both levels. Again noted and unchanged are mild chronic wedge compression fractures of T1, T2 and T5 and mild-to-moderate chronic compression fractures of T3 and T4. Paraspinal and other soft  tissues: There is dorsal paraspinous muscular atrophy which was seen previously. There is mucoid material and debris newly noted filling and obstructing the right lower lobe main and segmental bronchi with right lower lobe collapse. There are trace pleural effusions. Calcifications are partially visible in the thoracic aorta, coronary arteries and mitral ring. Moderate-sized hiatal hernia. Disc levels: The thoracic discs show multilevel degenerative disc height loss, spondylosis and facet spurring. There is no sizable disc herniation above T12 or cord compromise. Posterosuperior retropulsion at T11 and T12 flattens the ventral thecal sac without causing noteworthy spinal stenosis. At T12-L1, again noted is a left paracentral calcified disc extrusion extending cephalad in the epidural space posterior to the T12 body and  partially effacing the ventral CSF. This however, was seen previously with no mass effect on the conus. The foramina are again moderately stenotic at T8-9, T9-10, T10-11, and T11-12. No other foraminal compromise. CT LUMBAR SPINE FINDINGS Segmentation: 5 lumbar type vertebrae. Alignment: There is slight dextroscoliosis. Alignment is physiologic except for a 4 mm posterosuperior retropulsion of the L3 vertebral body cortex. Vertebrae: Osteopenia. There previously was a 25% upper plate anterior wedge compression fracture deformity of L3 which has worsened to approximately 50% loss of the anterior and central vertebral body height and 30% loss of the posterior vertebral body height since the prior study. There are intact anterior superior osteophytes of the L3 body suggesting a chronic or at least subacute process. There is no pedicle or posterior element fracture. The other lumbar vertebrae are normal in heights. No primary pathologic bone lesion is seen. The visualized sacrum and SI joints are intact, with SI joint spurring. Paraspinal and other soft tissues: Chronic fatty atrophy again noted in the  dorsal paraspinous and bilateral iliopsoas musculature. Chronic scarring and cortical volume loss inferior pole right kidney. Aortic atherosclerosis without AAA. No mass or paraspinal hematoma. Disc levels: T12-L1: Disc degeneration and vacuum phenomenon with disc calcifications and a calcified left paracentral disc extrusion with cranial extension described above. Clear foramina. L1-2: Mild diffuse disc bulge without herniation or stenosis. Normal disc height. Clear foramina. Trace facet spurring. L2-3: Slight disc space loss. Posterosuperior L3 retropulsion causing moderate spinal canal stenosis along with hypertrophic ligamentous and facet disease. There is foraminal disc bulging, mild bilateral foraminal stenosis. L3-4: Normal disc height. Diffuse disc bulge, dorsal ligamentous and facet hypertrophy causing mild to moderate spinal canal stenosis. Foraminal disc bulging with mild foraminal stenosis. L4-5: Chronic disc collapse, vacuum phenomenon and circumferential disc osteophyte complex, along with dorsal ligamentous and facet hypertrophy causing moderate acquired spinal canal stenosis. There is foraminal disc bulging and mild-to-moderate foraminal stenosis on the right. L5-S1: Chronic degenerative disc collapse and circumferential disc osteophyte complex. No herniation or canal stenosis. Facet hypertrophy with moderate to severe foraminal stenosis. IMPRESSION: 1. Acute generalized compression fracture of the T12 vertebral body with 30% height loss and 3 mm posterosuperior retropulsion. 2. Chronic compression fractures of T10 and T11 with interval kyphoplasty. 3. Chronic compression fractures of T1, T2, T3, T4. 4. Osteopenia and degenerative change thoracic and lumbar spine as detailed above. 5. Chronic T12-L1 left paracentral calcified disc extrusion with cranial extension. 6. Right lower lobe collapse with mucoid material and debris obstructing the right lower lobe main and segmental bronchi. 7. Trace pleural  effusions. 8. Hiatal hernia. 9. Aortic and coronary artery atherosclerosis. 10. Since 02/01/2022 CT, increased compression fracture of L3, now with 50% loss of the anterior and central vertebral body height and 30% loss of the posterior vertebral body height with Posterosuperior L3 retropulsion causing moderate spinal canal stenosis. Possible this could be acute on chronic injury, but I suspect it is probably subacute or chronic. Aortic Atherosclerosis (ICD10-I70.0). Electronically Signed   By: Telford Nab M.D.   On: 01/01/2023 20:44   CT Lumbar Spine Wo Contrast  Result Date: 01/01/2023 CLINICAL DATA:  Fall injury with back trauma.  Barium EXAM: CT  THORACIC AND LUMBAR SPINE WITHOUT CONTRAST TECHNIQUE: Multidetector CT imaging of the thoracic and lumbar spine was performed without intravenous contrast. Multiplanar CT image reconstructions were also generated. RADIATION DOSE REDUCTION: This exam was performed according to the departmental dose-optimization program which includes automated exposure control, adjustment of the mA and/or kV according  to patient size and/or use of iterative reconstruction technique. COMPARISON:  MRI thoracic spine 02/03/22, CT lumbar spine 02/01/2022. FINDINGS: CT THORACIC SPINE FINDINGS Segmentation: There are 12 rib-bearing thoracic type segments. Alignment: Minimal to mild upper to midthoracic kyphodextroscoliosis is unchanged. Trace degenerative anterolisthesis is again noted at T2-3 and T4-5 and up to 3 mm posterosuperior retropulsion noted at T11 and 12 due to a chronic T11 and acute T12 compression fractures. Vertebrae: Generalized osteopenia. There is an acute generalized compression fracture of the T12 vertebral body with intact pedicles and posterior elements and approximately 30% generalized vertebral height loss with 3 mm posterosuperior retropulsion. At T10 and T11 there are chronic compression fractures which were previously acute, with approximately 30% and 50%  maximum vertebral height loss, respectively with no increase in compression since the prior study with interval kyphoplasty at both levels. Again noted and unchanged are mild chronic wedge compression fractures of T1, T2 and T5 and mild-to-moderate chronic compression fractures of T3 and T4. Paraspinal and other soft tissues: There is dorsal paraspinous muscular atrophy which was seen previously. There is mucoid material and debris newly noted filling and obstructing the right lower lobe main and segmental bronchi with right lower lobe collapse. There are trace pleural effusions. Calcifications are partially visible in the thoracic aorta, coronary arteries and mitral ring. Moderate-sized hiatal hernia. Disc levels: The thoracic discs show multilevel degenerative disc height loss, spondylosis and facet spurring. There is no sizable disc herniation above T12 or cord compromise. Posterosuperior retropulsion at T11 and T12 flattens the ventral thecal sac without causing noteworthy spinal stenosis. At T12-L1, again noted is a left paracentral calcified disc extrusion extending cephalad in the epidural space posterior to the T12 body and partially effacing the ventral CSF. This however, was seen previously with no mass effect on the conus. The foramina are again moderately stenotic at T8-9, T9-10, T10-11, and T11-12. No other foraminal compromise. CT LUMBAR SPINE FINDINGS Segmentation: 5 lumbar type vertebrae. Alignment: There is slight dextroscoliosis. Alignment is physiologic except for a 4 mm posterosuperior retropulsion of the L3 vertebral body cortex. Vertebrae: Osteopenia. There previously was a 25% upper plate anterior wedge compression fracture deformity of L3 which has worsened to approximately 50% loss of the anterior and central vertebral body height and 30% loss of the posterior vertebral body height since the prior study. There are intact anterior superior osteophytes of the L3 body suggesting a chronic or at  least subacute process. There is no pedicle or posterior element fracture. The other lumbar vertebrae are normal in heights. No primary pathologic bone lesion is seen. The visualized sacrum and SI joints are intact, with SI joint spurring. Paraspinal and other soft tissues: Chronic fatty atrophy again noted in the dorsal paraspinous and bilateral iliopsoas musculature. Chronic scarring and cortical volume loss inferior pole right kidney. Aortic atherosclerosis without AAA. No mass or paraspinal hematoma. Disc levels: T12-L1: Disc degeneration and vacuum phenomenon with disc calcifications and a calcified left paracentral disc extrusion with cranial extension described above. Clear foramina. L1-2: Mild diffuse disc bulge without herniation or stenosis. Normal disc height. Clear foramina. Trace facet spurring. L2-3: Slight disc space loss. Posterosuperior L3 retropulsion causing moderate spinal canal stenosis along with hypertrophic ligamentous and facet disease. There is foraminal disc bulging, mild bilateral foraminal stenosis. L3-4: Normal disc height. Diffuse disc bulge, dorsal ligamentous and facet hypertrophy causing mild to moderate spinal canal stenosis. Foraminal disc bulging with mild foraminal stenosis. L4-5: Chronic disc collapse, vacuum phenomenon and circumferential disc osteophyte  complex, along with dorsal ligamentous and facet hypertrophy causing moderate acquired spinal canal stenosis. There is foraminal disc bulging and mild-to-moderate foraminal stenosis on the right. L5-S1: Chronic degenerative disc collapse and circumferential disc osteophyte complex. No herniation or canal stenosis. Facet hypertrophy with moderate to severe foraminal stenosis. IMPRESSION: 1. Acute generalized compression fracture of the T12 vertebral body with 30% height loss and 3 mm posterosuperior retropulsion. 2. Chronic compression fractures of T10 and T11 with interval kyphoplasty. 3. Chronic compression fractures of T1,  T2, T3, T4. 4. Osteopenia and degenerative change thoracic and lumbar spine as detailed above. 5. Chronic T12-L1 left paracentral calcified disc extrusion with cranial extension. 6. Right lower lobe collapse with mucoid material and debris obstructing the right lower lobe main and segmental bronchi. 7. Trace pleural effusions. 8. Hiatal hernia. 9. Aortic and coronary artery atherosclerosis. 10. Since 02/01/2022 CT, increased compression fracture of L3, now with 50% loss of the anterior and central vertebral body height and 30% loss of the posterior vertebral body height with Posterosuperior L3 retropulsion causing moderate spinal canal stenosis. Possible this could be acute on chronic injury, but I suspect it is probably subacute or chronic. Aortic Atherosclerosis (ICD10-I70.0). Electronically Signed   By: Telford Nab M.D.   On: 01/01/2023 20:44   CT Chest W Contrast  Result Date: 01/01/2023 CLINICAL DATA:  89 year old post trauma. EXAM: CT CHEST, ABDOMEN, AND PELVIS WITH CONTRAST TECHNIQUE: Multidetector CT imaging of the chest, abdomen and pelvis was performed following the standard protocol during bolus administration of intravenous contrast. RADIATION DOSE REDUCTION: This exam was performed according to the departmental dose-optimization program which includes automated exposure control, adjustment of the mA and/or kV according to patient size and/or use of iterative reconstruction technique. CONTRAST:  170m OMNIPAQUE IOHEXOL 300 MG/ML  SOLN COMPARISON:  Radiographs earlier today. Chest CT 03/25/2010 FINDINGS: CT CHEST FINDINGS Cardiovascular: No evidence of acute aortic or vascular injury. Aortic atherosclerosis. Aortic valvular and mitral annulus calcifications. The heart is normal in size. There is no pericardial effusion. Mediastinum/Nodes: Scattered small mediastinal lymph nodes are not enlarged by size criteria. Prominent right hilar node is likely reactive. Moderate-sized hiatal hernia no  pneumomediastinum. Lungs/Pleura: No pneumothorax. Rounded density in the right bronchus intermedius with complete low-density bronchial filling of the right lower lobe bronchi and complete right lower lobe collapse. There is a 2.9 cm fluid density structure within the area of atelectatic lung in the right lower lobe series 2, image 33. Subpleural reticulation and ground-glass in a peripheral and basilar predominant distribution. No significant pleural effusion. Musculoskeletal: Thoracic spine assessed on concurrent thoracic spine reformats, reported separately. No acute fracture of the ribs, sternum or included clavicles and shoulder girdles. CT ABDOMEN PELVIS FINDINGS Hepatobiliary: No hepatic injury or perihepatic hematoma. Mild hepatic steatosis. Gallbladder is unremarkable. Pancreas: No evidence of injury. No ductal dilatation or inflammation. Spleen: No splenic injury or perisplenic hematoma. Adrenals/Urinary Tract: No adrenal hemorrhage or renal injury identified. Atrophy and cortical scarring in the lower pole of the right kidney may represent sequela of prior renal infarct. Bladder is unremarkable. Stomach/Bowel: There is no evidence of bowel injury or mesenteric hematoma. No bowel inflammation or obstruction. Moderate-sized hiatal hernia. Vascular/Lymphatic: No evidence of vascular injury. Prominent aortic atherosclerosis without aneurysm. No retroperitoneal fluid. No gross adenopathy. Reproductive: Status post hysterectomy. No adnexal masses. Other: No free air or free fluid. Musculoskeletal: Lumbar spine assessed on concurrent lumbar spine reformats, reported separately. Bilateral hip arthroplasties. There is no evidence of pelvic fracture. IMPRESSION: 1. No  evidence of acute traumatic injury to the chest, abdomen, or pelvis. 2. Reference thoracic and lumbar spine reformats for spinal evaluation. 3. Rounded density in the right bronchus intermedius with complete low-density bronchial filling of the right  lower lobe bronchi and complete right lower lobe collapse. Acuity is uncertain but favored to be chronic. There is a 2.9 cm fluid density structure within the area of atelectatic lung in the right lower lobe. This is nonspecific. Both neoplastic or infectious etiologies are considered. Recommend bronchoscopy for further evaluation. 4. Subpleural reticulation and ground-glass in a peripheral and basilar predominant distribution, suspicious for interstitial lung disease. 5. Incidental findings in the abdomen and pelvis of hepatic steatosis and right renal scarring. Moderate-sized hiatal hernia. Aortic Atherosclerosis (ICD10-I70.0). Electronically Signed   By: Keith Rake M.D.   On: 01/01/2023 20:05   CT ABDOMEN PELVIS W CONTRAST  Result Date: 01/01/2023 CLINICAL DATA:  89 year old post trauma. EXAM: CT CHEST, ABDOMEN, AND PELVIS WITH CONTRAST TECHNIQUE: Multidetector CT imaging of the chest, abdomen and pelvis was performed following the standard protocol during bolus administration of intravenous contrast. RADIATION DOSE REDUCTION: This exam was performed according to the departmental dose-optimization program which includes automated exposure control, adjustment of the mA and/or kV according to patient size and/or use of iterative reconstruction technique. CONTRAST:  112m OMNIPAQUE IOHEXOL 300 MG/ML  SOLN COMPARISON:  Radiographs earlier today. Chest CT 03/25/2010 FINDINGS: CT CHEST FINDINGS Cardiovascular: No evidence of acute aortic or vascular injury. Aortic atherosclerosis. Aortic valvular and mitral annulus calcifications. The heart is normal in size. There is no pericardial effusion. Mediastinum/Nodes: Scattered small mediastinal lymph nodes are not enlarged by size criteria. Prominent right hilar node is likely reactive. Moderate-sized hiatal hernia no pneumomediastinum. Lungs/Pleura: No pneumothorax. Rounded density in the right bronchus intermedius with complete low-density bronchial filling of  the right lower lobe bronchi and complete right lower lobe collapse. There is a 2.9 cm fluid density structure within the area of atelectatic lung in the right lower lobe series 2, image 33. Subpleural reticulation and ground-glass in a peripheral and basilar predominant distribution. No significant pleural effusion. Musculoskeletal: Thoracic spine assessed on concurrent thoracic spine reformats, reported separately. No acute fracture of the ribs, sternum or included clavicles and shoulder girdles. CT ABDOMEN PELVIS FINDINGS Hepatobiliary: No hepatic injury or perihepatic hematoma. Mild hepatic steatosis. Gallbladder is unremarkable. Pancreas: No evidence of injury. No ductal dilatation or inflammation. Spleen: No splenic injury or perisplenic hematoma. Adrenals/Urinary Tract: No adrenal hemorrhage or renal injury identified. Atrophy and cortical scarring in the lower pole of the right kidney may represent sequela of prior renal infarct. Bladder is unremarkable. Stomach/Bowel: There is no evidence of bowel injury or mesenteric hematoma. No bowel inflammation or obstruction. Moderate-sized hiatal hernia. Vascular/Lymphatic: No evidence of vascular injury. Prominent aortic atherosclerosis without aneurysm. No retroperitoneal fluid. No gross adenopathy. Reproductive: Status post hysterectomy. No adnexal masses. Other: No free air or free fluid. Musculoskeletal: Lumbar spine assessed on concurrent lumbar spine reformats, reported separately. Bilateral hip arthroplasties. There is no evidence of pelvic fracture. IMPRESSION: 1. No evidence of acute traumatic injury to the chest, abdomen, or pelvis. 2. Reference thoracic and lumbar spine reformats for spinal evaluation. 3. Rounded density in the right bronchus intermedius with complete low-density bronchial filling of the right lower lobe bronchi and complete right lower lobe collapse. Acuity is uncertain but favored to be chronic. There is a 2.9 cm fluid density structure  within the area of atelectatic lung in the right lower lobe. This is  nonspecific. Both neoplastic or infectious etiologies are considered. Recommend bronchoscopy for further evaluation. 4. Subpleural reticulation and ground-glass in a peripheral and basilar predominant distribution, suspicious for interstitial lung disease. 5. Incidental findings in the abdomen and pelvis of hepatic steatosis and right renal scarring. Moderate-sized hiatal hernia. Aortic Atherosclerosis (ICD10-I70.0). Electronically Signed   By: Keith Rake M.D.   On: 01/01/2023 20:05   CT Head Wo Contrast  Result Date: 01/01/2023 CLINICAL DATA:  Head trauma, minor (Age >= 65y); Neck trauma (Age >= 65y) EXAM: CT HEAD WITHOUT CONTRAST CT CERVICAL SPINE WITHOUT CONTRAST TECHNIQUE: Multidetector CT imaging of the head and cervical spine was performed following the standard protocol without intravenous contrast. Multiplanar CT image reconstructions of the cervical spine were also generated. RADIATION DOSE REDUCTION: This exam was performed according to the departmental dose-optimization program which includes automated exposure control, adjustment of the mA and/or kV according to patient size and/or use of iterative reconstruction technique. COMPARISON:  02/01/2022 FINDINGS: CT HEAD FINDINGS Brain: No evidence of acute infarction, hemorrhage, hydrocephalus, extra-axial collection or mass lesion/mass effect. Extensive low-density changes within the periventricular and subcortical white matter most compatible with chronic microvascular ischemic change. Moderate diffuse cerebral volume loss. Vascular: Atherosclerotic calcifications involving the large vessels of the skull base. No unexpected hyperdense vessel. Skull: Normal. Negative for fracture or focal lesion. Sinuses/Orbits: No acute finding. Other: None. CT CERVICAL SPINE FINDINGS Alignment: Facet joints are aligned without dislocation or traumatic listhesis. Dens and lateral masses are  aligned. Skull base and vertebrae: No acute fracture. No primary bone lesion or focal pathologic process. Soft tissues and spinal canal: No prevertebral fluid or swelling. No visible canal hematoma. Disc levels: Advanced facet-predominant multilevel cervical spondylosis. Upper chest: Negative. Other: Bilateral carotid atherosclerosis. IMPRESSION: 1. No acute intracranial abnormality. 2. No acute fracture or subluxation of the cervical spine. Electronically Signed   By: Davina Poke D.O.   On: 01/01/2023 16:27   CT Cervical Spine Wo Contrast  Result Date: 01/01/2023 CLINICAL DATA:  Head trauma, minor (Age >= 65y); Neck trauma (Age >= 65y) EXAM: CT HEAD WITHOUT CONTRAST CT CERVICAL SPINE WITHOUT CONTRAST TECHNIQUE: Multidetector CT imaging of the head and cervical spine was performed following the standard protocol without intravenous contrast. Multiplanar CT image reconstructions of the cervical spine were also generated. RADIATION DOSE REDUCTION: This exam was performed according to the departmental dose-optimization program which includes automated exposure control, adjustment of the mA and/or kV according to patient size and/or use of iterative reconstruction technique. COMPARISON:  02/01/2022 FINDINGS: CT HEAD FINDINGS Brain: No evidence of acute infarction, hemorrhage, hydrocephalus, extra-axial collection or mass lesion/mass effect. Extensive low-density changes within the periventricular and subcortical white matter most compatible with chronic microvascular ischemic change. Moderate diffuse cerebral volume loss. Vascular: Atherosclerotic calcifications involving the large vessels of the skull base. No unexpected hyperdense vessel. Skull: Normal. Negative for fracture or focal lesion. Sinuses/Orbits: No acute finding. Other: None. CT CERVICAL SPINE FINDINGS Alignment: Facet joints are aligned without dislocation or traumatic listhesis. Dens and lateral masses are aligned. Skull base and vertebrae: No  acute fracture. No primary bone lesion or focal pathologic process. Soft tissues and spinal canal: No prevertebral fluid or swelling. No visible canal hematoma. Disc levels: Advanced facet-predominant multilevel cervical spondylosis. Upper chest: Negative. Other: Bilateral carotid atherosclerosis. IMPRESSION: 1. No acute intracranial abnormality. 2. No acute fracture or subluxation of the cervical spine. Electronically Signed   By: Davina Poke D.O.   On: 01/01/2023 16:27   DG Lumbar Spine Complete  Result Date: 01/01/2023 CLINICAL DATA:  Fall with back pain. EXAM: LUMBAR SPINE - COMPLETE 4+ VIEW COMPARISON:  CT of the lumbar spine peripheral armed on March 06/26/2022 FINDINGS: Osteopenia.  Spinal degenerative changes. Signs of cement augmentation in the lower thoracic spine. Five lumbar type vertebral bodies. Similar appearance of slight increased loss of height suspected also at L1 since imaging from February 01, 2022. Marked narrowing of the disc space at L4-5 and L5-S1 with facet hypertrophy at these levels as well. Signs of interval at least 40% loss of height of the T12 vertebral body. Subtle lucency across the posterior cortex and mild retropulsion suspected. Difficult to exclude pedicle involvement. No gross change in alignment. T10 and T11 cement augmentation since prior imaging. IMPRESSION: 1. Signs of interval at least 40% loss of height of the T12 vertebral body. Subtle lucency across the posterior cortex and mild retropulsion suspected. Difficult to exclude pedicle involvement. CT may be helpful for further evaluation. 2. Suspect slight interval loss of height also at L3 where there was of compression fracture seen on previous image. Unclear whether this is acute or represents changes related to healing and interval change since February 01, 2022. Correlate with any signs of point tenderness in this location. 3. Signs of cement augmentation of vertebral bodies in the lower thoracic spine at T10 and  T11. These results were called by telephone at the time of interpretation on 01/01/2023 at 3:59 pm to provider [facility] , who verbally acknowledged these results. Electronically Signed   By: Zetta Bills M.D.   On: 01/01/2023 15:59   DG Pelvis 1-2 Views  Result Date: 01/01/2023 CLINICAL DATA:  Fall with back pain in a 89 year old female. EXAM: PELVIS - 1-2 VIEW COMPARISON:  Lumbar spine evaluation of the same date. Prior pelvic evaluation from March of 2023 FINDINGS: Post bilateral hip arthroplasty. Femoral component of arthroplasty is are incompletely imaged. There is no sign of acute pelvic process in this osteopenic patient. Incidental note is made of degenerative changes in the lumbar spine. IMPRESSION: 1. Post bilateral hip arthroplasty without signs of acute fracture. 2. Degenerative changes in the lumbar spine. 3. Femoral components of bilateral hip arthroplasty changes are incompletely assessed. Electronically Signed   By: Zetta Bills M.D.   On: 01/01/2023 15:50   DG Chest 2 View  Result Date: 01/01/2023 CLINICAL DATA:  Fall, back pain. EXAM: CHEST - 2 VIEW COMPARISON:  Chest x-ray dated 02/01/2022. FINDINGS: Vague opacity within the RIGHT lower lung, possibly layering pleural effusion. LEFT lung appears clear. No pneumothorax is seen. Heart size and mediastinal contours appear stable. Interval changes of kyphoplasty/vertebroplasty at the level of the lower thoracic spine, involving at least 2 adjacent vertebral bodies. There is an additional compression fracture deformity at the thoracolumbar junction, of uncertain chronicity. IMPRESSION: 1. Vague opacity within the RIGHT lower lung, possibly layering pleural effusion. Posttraumatic hemothorax not excluded. Neoplastic mass also not excluded. Recommend chest CT for further characterization. 2. Interval changes of kyphoplasty/vertebroplasty at the level of the lower thoracic spine, involving at least 2 adjacent vertebral bodies. 3. There is  an additional compression fracture deformity at the thoracolumbar junction, of uncertain chronicity. This can also be further characterized on the chest CT recommended above (ask technologist to make sure that the upper lumbar spine is included). Electronically Signed   By: Franki Cabot M.D.   On: 01/01/2023 15:44    Scheduled Meds:  diltiazem  120 mg Oral Daily   gabapentin  300 mg Oral  BID   pantoprazole  40 mg Oral Daily   sertraline  100 mg Oral Daily   Continuous Infusions:  sodium chloride 125 mL/hr at 01/01/23 2340    LOS: 0 days   Raiford Noble, DO [facility] Available via Epic secure chat 7am-7pm After these hours, please refer to coverage provider listed on [email or web address] 01/02/2023, 8:17 AM

## 2023-01-02 NOTE — TOC Initial Note (Signed)
Transition of Care [facility]) - Initial/Assessment Note    Patient Details  Name: [name] MRN: [id number] Date of Birth: Aug 24, 1932  Transition of Care [facility]) CM/SW Contact:    Jodi Pall, LCSW Phone Number: 01/02/2023, 3:50 PM  Clinical Narrative:                 Met with pt today to discuss PT recommendations for possible SNF rehab.  Pt quickly states, "I don't want to go to rehab."  Notes she has been to a SNF prior and insists that her two daughters can assist at home.  Have attempted to reach daughter, [name], at # listed but will not go through.  Have left a VM noted for [name] and hopeful she will return call.  Need to confirm level of family support for dc planning.  Expected Discharge Plan: [place] (vs. SNF) Barriers to Discharge: Continued Medical Work up   Patient Goals and CMS Choice Patient states their goals for this hospitalization and ongoing recovery are:: return home          Expected Discharge Plan and Services In-house Referral: Clinical Social Work     Living arrangements for the past 2 months: Apartment                                      Prior Living Arrangements/Services Living arrangements for the past 2 months: Apartment Lives with:: Self Patient language and need for interpreter reviewed:: Yes Do you feel safe going back to the place where you live?: Yes      Need for Family Participation in Patient Care: Yes (Comment) Care giver support system in place?:  (TBD)   Criminal Activity/Legal Involvement Pertinent to Current Situation/Hospitalization: No - Comment as needed  Activities of Daily Living Home Assistive Devices/Equipment: Eyeglasses, Environmental consultant (specify type) ADL Screening (condition at time of admission) Patient's cognitive ability adequate to safely complete daily activities?: Yes Is the patient deaf or have difficulty hearing?: No Does the patient have difficulty seeing, even when wearing  glasses/contacts?: No Does the patient have difficulty concentrating, remembering, or making decisions?: No Patient able to express need for assistance with ADLs?: Yes Does the patient have difficulty dressing or bathing?: No Independently performs ADLs?: Yes (appropriate for developmental age) Does the patient have difficulty walking or climbing stairs?: No Weakness of Legs: Both Weakness of Arms/Hands: None  Permission Sought/Granted Permission sought to share information with : Family Supports Permission granted to share information with : Yes, Verbal Permission Granted  Share Information with NAME: [name]     Permission granted to share info w Relationship: daughter  Permission granted to share info w Contact Information: [phone]  Emotional Assessment Appearance:: Appears stated age Attitude/Demeanor/Rapport: Complaining Affect (typically observed): Irritable Orientation: : Oriented to Self, Oriented to Place, Oriented to  Time, Oriented to Situation Alcohol / Substance Use: Not Applicable Psych Involvement: No (comment)  Admission diagnosis:  Rhabdomyolysis [M62.82] Fall, initial encounter [W19.XXXA] Compression fracture of T12 vertebra, initial encounter ([place]) [id number] Patient Active Problem List   Diagnosis Date Noted   Rhabdomyolysis 01/01/2023   Recurrent falls 01/01/2023   Supratherapeutic INR 01/01/2023   Hyperkalemia 01/01/2023   Elevated transaminase level 01/01/2023   Vertebral compression fracture ([place]) 01/01/2023   CAD (coronary artery disease) 01/01/2023   Macular degeneration 10/04/2022   Acquired hemophilia ([place]) 06/28/2022   Hammer toe of right foot 06/09/2022  Absent pedal pulses 05/24/2022   Tinea unguium 05/24/2022   Urge incontinence 05/19/2021   Pressure injury of left foot, stage 2 ([place]) 12/08/2020   Peripheral arterial disease ([place]) 11/13/2019   Prediabetes 09/25/2018   Vitamin D deficiency 09/04/2018   Permanent atrial  fibrillation ([place]) 09/04/2018   Osteoarthritis of left knee 06/07/2018   Long-term current use of opiate analgesic 11/22/2017   Chronic low back pain 11/22/2017   Menopause present 03/27/2017   Osteopenia 03/27/2017   Chronic bronchitis with COPD (chronic obstructive pulmonary disease) 03/24/2016   Mitral insufficiency    HTN (hypertension)    LVH (left ventricular hypertrophy) due to hypertensive disease    Pulmonary hypertension ([place])    HLD (hyperlipidemia)    Back pain, chronic    Depression    PCP:  [name], MD (Inactive) Pharmacy:   [facility], [place] [address] Phone: [phone] Fax: [phone]     Social Determinants of Health (SDOH) Social History: SDOH Screenings   Food Insecurity: No Food Insecurity (01/02/2023)  Housing: Low Risk  (01/02/2023)  Recent Concern: Housing - Medium Risk (12/28/2022)  Transportation Needs: No Transportation Needs (01/02/2023)  Utilities: Not At Risk (01/02/2023)  Alcohol Screen: Low Risk  (03/02/2022)  Depression (PHQ2-9): Low Risk  (03/02/2022)  Financial Resource Strain: High Risk (12/28/2022)  Physical Activity: Inactive (05/17/2022)  Stress: Stress Concern Present (03/02/2022)  Tobacco Use: Low Risk  (01/01/2023)   SDOH Interventions: Food Insecurity Interventions: Intervention Not Indicated Transportation Interventions: Intervention Not Indicated Utilities Interventions: Intervention Not Indicated   Readmission Risk Interventions     No data to display

## 2023-01-02 NOTE — Evaluation (Signed)
Physical Therapy Evaluation Patient Details Name: [name] MRN: [id number] DOB: 25-Dec-1931 Today's Date: 01/02/2023  History of Present Illness  89 y.o. female presents to the ED via EMS after a fall at home last night. Reportedly patient has been crawling around on the floor and complained of soreness of her legs and low back pain.   imaging revealed acute generalized compression fracture of the T12, multiple other  other compression fxs appear chronic per radiologist  PMH: CAD, permanent A-fib on Coumadin, mild to moderate MR, mild to moderate TR, hypertension, LVH, hyperlipidemia, pulmonary hypertension, arthritis, chronic back pain, depression, COPD and chronic bronchitis, PAD, GERD,  urge incontinence, macular degeneration, B12 deficiency.  Clinical Impression  Pt admitted with above diagnosis.  Mobility limited as a precaution d/t pt with multiple recent falls and elevated INR. Pt overall min assist for transfers; may benefit from SNF If agreeable   Pt currently with functional limitations due to the deficits listed below (see PT Problem List). Pt will benefit from skilled PT to increase their independence and safety with mobility to allow discharge to the venue listed below.          Recommendations for follow up therapy are one component of a multi-disciplinary discharge planning process, led by the attending physician.  Recommendations may be updated based on patient status, additional functional criteria and insurance authorization.  Follow Up Recommendations Skilled nursing-short term rehab (<3 hours/day) (HHPT if not agreeable) Can patient physically be transported by private vehicle: Yes    Assistance Recommended at Discharge Intermittent Supervision/Assistance  Patient can return home with the following  A little help with walking and/or transfers;A little help with bathing/dressing/bathroom;Help with stairs or ramp for entrance;Assist for transportation    Equipment  Recommendations None recommended by PT  Recommendations for Other Services       Functional Status Assessment Patient has had a recent decline in their functional status and demonstrates the ability to make significant improvements in function in a reasonable and predictable amount of time.     Precautions / Restrictions Precautions Precautions: Fall;Other (comment);Back Precaution Comments: elevated INR 5.7 at time of eval Restrictions Weight Bearing Restrictions: No      Mobility  Bed Mobility Overal bed mobility: Needs Assistance Bed Mobility: Supine to Sit     Supine to sit: Min guard, HOB elevated     General bed mobility comments: for safety, pt getting OOB iwth NT on arrival    Transfers Overall transfer level: Needs assistance Equipment used: Rolling walker (2 wheels) Transfers: Sit to/from Stand, Bed to chair/wheelchair/BSC Sit to Stand: Min assist Stand pivot transfers: Min assist         General transfer comment: incr time, feet blocked by PT to maintain knee flexion for STS from bed and BSC, cues for hand placement, assist to rise and steady. trunk flexed in standing    Ambulation/Gait       Gait Pattern/deviations: Narrow base of support, Trunk flexed       General Gait Details: pivotal steps only  Financial trader Rankin (Stroke Patients Only)       Balance Overall balance assessment: Needs assistance Sitting-balance support: Single extremity supported, Feet supported Sitting balance-Leahy Scale: Good     Standing balance support: Reliant on assistive device for balance, During functional activity Standing balance-Leahy Scale: Poor  Pertinent Vitals/Pain Pain Assessment Pain Assessment: No/denies pain    Home Living Family/patient expects to be discharged to:: Private residence Living Arrangements: Alone Available Help at Discharge: Available  PRN/intermittently Type of Home: Apartment Home Access: Level entry       Home Layout: One level Home Equipment: Rollator (4 wheels) Additional Comments: amb with rollator, dtrs check on her "one has it in for me" per pt; pt admits to multiple recent falls and states she has beenm crawling around on her knees at times; she is ususally able to get up from carpeted floor surface in her bedroom using bed or other furniture to assist self to standing    Prior Function Prior Level of Function : Independent/Modified Independent             Mobility Comments: pt reports amb household distances with rollator, mod I       Hand Dominance        Extremity/Trunk Assessment   Upper Extremity Assessment Upper Extremity Assessment: Defer to OT evaluation    Lower Extremity Assessment Lower Extremity Assessment: Generalized weakness       Communication   Communication: No difficulties  Cognition Arousal/Alertness: Awake/alert Behavior During Therapy: WFL for tasks assessed/performed Overall Cognitive Status: Within Functional Limits for tasks assessed                                 General Comments: overall follows commands with incr time, pt is very methodical and requires excessive time to complete basic tasks (such as hygiene)        General Comments      Exercises     Assessment/Plan    PT Assessment Patient needs continued PT services  PT Problem List Decreased strength;Decreased balance;Decreased activity tolerance;Decreased mobility;Decreased knowledge of use of DME;Decreased safety awareness       PT Treatment Interventions DME instruction;Gait training;Functional mobility training;Therapeutic activities;Therapeutic exercise;Patient/family education    PT Goals (Current goals can be found in the Care Plan section)  Acute Rehab PT Goals PT Goal Formulation: With patient Time For Goal Achievement: 01/16/23 Potential to Achieve Goals: Good     Frequency Min 2X/week     Co-evaluation               AM-PAC PT "6 Clicks" Mobility  Outcome Measure Help needed turning from your back to your side while in a flat bed without using bedrails?: A Little Help needed moving from lying on your back to sitting on the side of a flat bed without using bedrails?: A Little Help needed moving to and from a bed to a chair (including a wheelchair)?: A Little Help needed standing up from a chair using your arms (e.g., wheelchair or bedside chair)?: A Little Help needed to walk in hospital room?: A Little Help needed climbing 3-5 steps with a railing? : A Lot 6 Click Score: 17    End of Session Equipment Utilized During Treatment: Gait belt Activity Tolerance: Patient tolerated treatment well Patient left: in chair;with call bell/phone within reach;with chair alarm set Nurse Communication: Mobility status PT Visit Diagnosis: Other abnormalities of gait and mobility (R26.89);Repeated falls (R29.6)    Time: [id number] PT Time Calculation (min) (ACUTE ONLY): 21 min   Charges:   PT Evaluation $PT Eval Low Complexity: [place], PT  Acute Rehab Dept [facility]) [phone]  [facility] Weekend Pager (Saturday/Sunday only)  [phone]  01/02/2023   [facility] 01/02/2023, 11:19 AM

## 2023-01-03 ENCOUNTER — Ambulatory Visit: Payer: Medicare Other

## 2023-01-03 ENCOUNTER — Other Ambulatory Visit: Payer: Self-pay | Admitting: Cardiology

## 2023-01-03 ENCOUNTER — Other Ambulatory Visit: Payer: Self-pay | Admitting: Family Medicine

## 2023-01-03 DIAGNOSIS — R296 Repeated falls: Secondary | ICD-10-CM | POA: Diagnosis not present

## 2023-01-03 DIAGNOSIS — E782 Mixed hyperlipidemia: Secondary | ICD-10-CM

## 2023-01-03 DIAGNOSIS — M6282 Rhabdomyolysis: Secondary | ICD-10-CM | POA: Diagnosis not present

## 2023-01-03 DIAGNOSIS — R7401 Elevation of levels of liver transaminase levels: Secondary | ICD-10-CM | POA: Diagnosis not present

## 2023-01-03 DIAGNOSIS — E875 Hyperkalemia: Secondary | ICD-10-CM | POA: Diagnosis not present

## 2023-01-03 DIAGNOSIS — E1142 Type 2 diabetes mellitus with diabetic polyneuropathy: Secondary | ICD-10-CM

## 2023-01-03 DIAGNOSIS — I10 Essential (primary) hypertension: Secondary | ICD-10-CM

## 2023-01-03 LAB — RETICULOCYTES
Immature Retic Fract: 16.1 % — ABNORMAL HIGH (ref 2.3–15.9)
RBC.: 4.12 MIL/uL (ref 3.87–5.11)
Retic Count, Absolute: 50.7 10*3/uL (ref 19.0–186.0)
Retic Ct Pct: 1.2 % (ref 0.4–3.1)

## 2023-01-03 LAB — CBC WITH DIFFERENTIAL/PLATELET
Abs Immature Granulocytes: 0.02 10*3/uL (ref 0.00–0.07)
Basophils Absolute: 0 10*3/uL (ref 0.0–0.1)
Basophils Relative: 1 %
Eosinophils Absolute: 0.2 10*3/uL (ref 0.0–0.5)
Eosinophils Relative: 2 %
HCT: 35.4 % — ABNORMAL LOW (ref 36.0–46.0)
Hemoglobin: 10.8 g/dL — ABNORMAL LOW (ref 12.0–15.0)
Immature Granulocytes: 0 %
Lymphocytes Relative: 13 %
Lymphs Abs: 0.9 10*3/uL (ref 0.7–4.0)
MCH: 26 pg (ref 26.0–34.0)
MCHC: 30.5 g/dL (ref 30.0–36.0)
MCV: 85.3 fL (ref 80.0–100.0)
Monocytes Absolute: 0.7 10*3/uL (ref 0.1–1.0)
Monocytes Relative: 9 %
Neutro Abs: 5.2 10*3/uL (ref 1.7–7.7)
Neutrophils Relative %: 75 %
Platelets: 296 10*3/uL (ref 150–400)
RBC: 4.15 MIL/uL (ref 3.87–5.11)
RDW: 17.2 % — ABNORMAL HIGH (ref 11.5–15.5)
WBC: 7 10*3/uL (ref 4.0–10.5)
nRBC: 0 % (ref 0.0–0.2)

## 2023-01-03 LAB — IRON AND TIBC
Iron: 68 ug/dL (ref 28–170)
Saturation Ratios: 30 % (ref 10.4–31.8)
TIBC: 230 ug/dL — ABNORMAL LOW (ref 250–450)
UIBC: 162 ug/dL

## 2023-01-03 LAB — FERRITIN: Ferritin: 262 ng/mL (ref 11–307)

## 2023-01-03 LAB — COMPREHENSIVE METABOLIC PANEL
ALT: 51 U/L — ABNORMAL HIGH (ref 0–44)
AST: 60 U/L — ABNORMAL HIGH (ref 15–41)
Albumin: 2.5 g/dL — ABNORMAL LOW (ref 3.5–5.0)
Alkaline Phosphatase: 71 U/L (ref 38–126)
Anion gap: 6 (ref 5–15)
BUN: 20 mg/dL (ref 8–23)
CO2: 23 mmol/L (ref 22–32)
Calcium: 9 mg/dL (ref 8.9–10.3)
Chloride: 108 mmol/L (ref 98–111)
Creatinine, Ser: 0.74 mg/dL (ref 0.44–1.00)
GFR, Estimated: >60 mL/min (ref >60)
Glucose, Bld: 89 mg/dL (ref 70–99)
Potassium: 3.7 mmol/L (ref 3.5–5.1)
Sodium: 137 mmol/L (ref 135–145)
Total Bilirubin: 0.8 mg/dL (ref 0.3–1.2)
Total Protein: 6 g/dL — ABNORMAL LOW (ref 6.5–8.1)

## 2023-01-03 LAB — PHOSPHORUS: Phosphorus: 3.4 mg/dL (ref 2.5–4.6)

## 2023-01-03 LAB — VITAMIN B12: Vitamin B-12: 423 pg/mL (ref 180–914)

## 2023-01-03 LAB — PROTIME-INR
INR: 3.2 — ABNORMAL HIGH (ref 0.8–1.2)
Prothrombin Time: 32.4 seconds — ABNORMAL HIGH (ref 11.4–15.2)

## 2023-01-03 LAB — FOLATE: Folate: 4.1 ng/mL — ABNORMAL LOW (ref >5.9)

## 2023-01-03 LAB — CK: Total CK: 650 U/L — ABNORMAL HIGH (ref 38–234)

## 2023-01-03 LAB — MAGNESIUM: Magnesium: 1.8 mg/dL (ref 1.7–2.4)

## 2023-01-03 MED ORDER — FOLIC ACID 1 MG PO TABS
1.0000 mg | ORAL_TABLET | Freq: Every day | ORAL | Status: DC
  Administered 2023-01-03 – 2023-01-08 (×5): 1 mg via ORAL
  Filled 2023-01-03 (×5): qty 1

## 2023-01-03 MED ORDER — SODIUM CHLORIDE 0.9 % IV SOLN: INTRAVENOUS | Status: DC

## 2023-01-03 NOTE — TOC Progression Note (Signed)
Transition of Care [facility]) - Progression Note    Patient Details  Name: [name] MRN: [id number] Date of Birth: 29-May-1932  Transition of [facility]) CM/SW Contact  Lennart Pall, LCSW Phone Number: 01/03/2023, 12:31 PM  Clinical Narrative:     I spoke with daughter, [name], this morning and she reports that family is absolutely not able to provide 24/7care.  Daughter insists SNF has to be plan so pt can have rehab.  I have spoken again with pt and explaining that returning home would not be a safe dc plan.  She is clearly not happy with the information but is reluctantly agreeable with SNF plan.  Have alerted treatment team.   Expected Discharge Plan: [place] (vs. SNF) Barriers to Discharge: Continued Medical Work up  Expected Discharge Plan and Services In-house Referral: Clinical Social Work     Living arrangements for the past 2 months: Apartment                                       Social Determinants of Health (SDOH) Interventions SDOH Screenings   Food Insecurity: No Food Insecurity (01/02/2023)  Housing: Low Risk  (01/02/2023)  Recent Concern: Housing - Medium Risk (12/28/2022)  Transportation Needs: No Transportation Needs (01/02/2023)  Utilities: Not At Risk (01/02/2023)  Alcohol Screen: Low Risk  (03/02/2022)  Depression (PHQ2-9): Low Risk  (03/02/2022)  Financial Resource Strain: High Risk (12/28/2022)  Physical Activity: Inactive (05/17/2022)  Stress: Stress Concern Present (03/02/2022)  Tobacco Use: Low Risk  (01/01/2023)    Readmission Risk Interventions     No data to display

## 2023-01-03 NOTE — NC FL2 (Signed)
[place] LEVEL OF CARE FORM     IDENTIFICATION  Patient Name: [name] Birthdate: 1932/09/27 Sex: female Admission Date (Current Location): 01/01/2023  [facility] and [place] Number:  Herbalist and Address:  [facility],  [place], [place]      Provider Number: [id number]  Attending Physician Name and Address:  Kerney Elbe, DO  Relative Name and Phone Number:  Meliah Drolet, daughter @ [phone]    Current Level of Care: Hospital Recommended Level of Care: [place] Prior Approval Number:    Date Approved/Denied:   PASRR Number: [id number]  Discharge Plan: SNF    Current Diagnoses: Patient Active Problem List   Diagnosis Date Noted   Rhabdomyolysis 01/01/2023   Recurrent falls 01/01/2023   Supratherapeutic INR 01/01/2023   Hyperkalemia 01/01/2023   Elevated transaminase level 01/01/2023   Vertebral compression fracture ([place]) 01/01/2023   CAD (coronary artery disease) 01/01/2023   Macular degeneration 10/04/2022   Acquired hemophilia ([place]) 06/28/2022   Hammer toe of right foot 06/09/2022   Absent pedal pulses 05/24/2022   Tinea unguium 05/24/2022   Urge incontinence 05/19/2021   Pressure injury of left foot, stage 2 ([place]) 12/08/2020   Peripheral arterial disease ([place]) 11/13/2019   Prediabetes 09/25/2018   Vitamin D deficiency 09/04/2018   Permanent atrial fibrillation ([place]) 09/04/2018   Osteoarthritis of left knee 06/07/2018   Long-term current use of opiate analgesic 11/22/2017   Chronic low back pain 11/22/2017   Menopause present 03/27/2017   Osteopenia 03/27/2017   Chronic bronchitis with COPD (chronic obstructive pulmonary disease) 03/24/2016   Mitral insufficiency    HTN (hypertension)    LVH (left ventricular hypertrophy) due to hypertensive disease    Pulmonary hypertension (HCC)    HLD (hyperlipidemia)    Back pain, chronic    Depression     Orientation  RESPIRATION BLADDER Height & Weight     Self, Time, Situation, Place  Normal Continent, External catheter (currently with purewick) Weight: 141 lb 15.6 oz (64.4 kg) Height:  '5\' 4"'$  (162.6 cm)  BEHAVIORAL SYMPTOMS/MOOD NEUROLOGICAL BOWEL NUTRITION STATUS      Continent Diet (carb modified)  AMBULATORY STATUS COMMUNICATION OF NEEDS Skin   Extensive Assist Verbally Normal                       Personal Care Assistance Level of Assistance  Bathing, Dressing Bathing Assistance: Limited assistance   Dressing Assistance: Limited assistance     Functional Limitations Info  Sight, Hearing, Speech Sight Info: Adequate Hearing Info: Impaired Speech Info: Adequate    SPECIAL CARE FACTORS FREQUENCY  PT (By licensed PT), OT (By licensed OT)     PT Frequency: 5x/wk OT Frequency: 5x/wk            Contractures Contractures Info: Not present    Additional Factors Info  Code Status, Allergies, Psychotropic Code Status Info: DNR Allergies Info: Sulfamethoxazole, Sulfanilamide, Sulfur Dioxide, Penicillins Psychotropic Info: see MAR         Current Medications (01/03/2023):  This is the current hospital active medication list Current Facility-Administered Medications  Medication Dose Route Frequency Provider Last Rate Last Admin   0.9 %  sodium chloride infusion   Intravenous Continuous Raiford Noble [place], DO 50 mL/hr at 01/03/23 1130 Restarted at 01/03/23 1130   diltiazem (CARDIZEM CD) 24 hr capsule 120 mg  120 mg Oral Daily Shela Leff, MD   120 mg at 01/03/23 [phone]  gabapentin (NEURONTIN) capsule 300 mg  300 mg Oral BID Shela Leff, MD   300 mg at 01/03/23 [id number]   ipratropium-albuterol (DUONEB) 0.5-2.5 (3) MG/3ML nebulizer solution 3 mL  3 mL Nebulization Q6H PRN Shela Leff, MD       naloxone [facility]) injection 0.4 mg  0.4 mg Intravenous PRN Shela Leff, MD       oxyCODONE (Oxy IR/ROXICODONE) immediate release tablet 5 mg  5 mg Oral Q6H PRN Shela Leff, MD       pantoprazole (PROTONIX) EC tablet 40 mg  40 mg Oral Daily Shela Leff, MD   40 mg at 01/03/23 [id number]   sertraline (ZOLOFT) tablet 100 mg  100 mg Oral Daily Shela Leff, MD   100 mg at 01/03/23 [id number]     Discharge Medications: Please see discharge summary for a list of discharge medications.  Relevant Imaging Results:  Relevant Lab Results:   Additional Information SS# [id number]  Lennart Pall, LCSW

## 2023-01-03 NOTE — Plan of Care (Signed)
  Problem: Nutrition: Goal: Adequate nutrition will be maintained Outcome: Progressing   Problem: Coping: Goal: Level of anxiety will decrease Outcome: Progressing   

## 2023-01-03 NOTE — Progress Notes (Signed)
PROGRESS NOTE    [name]  [id number] DOB: [birth year] DOA: 01/01/2023 PCP: [name], MD (Inactive)   Brief Narrative:  The patient is an 89 year old elderly Caucasian female with past medical history significant for but #2 CAD, permanent atrial fibrillation on anticoagulation with Coumadin, mild to moderate MR, mild to moderate TR, hypertension, LVH, hyperlipidemia, pulmonary hypertension, arthritis, chronic back pain, depression, COPD and chronic bronchitis, PAD, GERD, prediabetes, urge incontinence, macular degeneration, B12 deficiency as well as other comorbidities who presents to the ED via EMS after a fall around the night before last.  She reported had been crawling around on the floor and complained of soreness in her legs and low back.  On arrival to the ED she is awake and alert and oriented x 3 and answers all questions appropriately.  She underwent further lab work and testing.  Head CT and C-spine were negative for any acute findings.  CT of the chest was negative for any acute trauma but did show a rounded density in the right bronchus intermedius with complete density bronchial filling of the right lower lobe bronchi and complete right lower lobe collapse.  The acuity was unclear but it failed to be chronic.  The recommendation is for bronchoscopy but given that she had a supra therapeutic INR we will hold off on pulmonary consultation until her INR starts drifting downward.   CT scan of the thoracic spine showed acute generalized compression fracture T12 with vertebral body 30% height loss and 3 mm posterior superior retropulsion.  There is also noted to have chronic compression fractures of T10 and T8 along with interval kyphoplasty.  She is also noted to have chronic compression fractures of T1, T2, T3 and T4 as well as chronic T12-L1 left paracentral calcified disc extrusion with cranial extension.   A CT scan of the lumbar spine was done and showed increased  compression fracture of L3 now with 50% of anterior and central vertebral body height loss and 30% loss of the posterior vertebral body height with posterior superior L3 retropulsion causing moderate spinal canal stenosis.  This is felt to be possibly acute on chronic injury but the radiologist suspects subacute on chronic.   She normally ambulates with a walker but has chronic issues due to her balance due to peripheral neuropathy.  She continues to live alone and has frequent falls and had another fall and she was in the kitchen when she fell and slipped on her buttocks.  She presented for this and was admitted for further workup and PT OT recommending SNF.  She is currently getting IV fluid hydration for rhabdomyolysis and will continue today and will consult pulmonary for her CT scan findings once her INR dropped down further and is more therapeutic.  Patient is finally agreeable to SNF.  Continues to get IV fluid hydration  Assessment and Plan:  Recurrent falls -Appear to be related to chronic issues with balance in the setting of peripheral neuropathy, uses a walker to ambulate.   -She had another fall the other night which appears to be mechanical based on history. -Patient denies head injury or loss of consciousness.   -Denies preceding lightheadedness/dizziness, chest pain, shortness of breath.   -She lives alone and it is not safe for her to return home given recurrent falls and the fact that she is on chronic anticoagulation for A-fib.  - PT/OT consulted, fall precautions.  TOC consulted to help with placement and they are recommending SNF and patient is resistant  to SNF but patient's family cannot provide her 24/7 care and daughter insist that she has to be the plan so she can have rehab.  Patient has not clearly happy with the plan but reluctantly agreeable.  Once she is medically stable she can discharge to SNF will need further investigation for her right lung lesion as below    Permanent A-fib on Coumadin with supratherapeutic INR -INR supratherapeutic at 5.6 and repeat 5.7 yesterday and today is 3.2 -Patient reports increase in Coumadin dose a few months ago but reportedly had INR checked a month ago and it was within therapeutic range.   -She denies any changes to her diet or taking any extra doses of Coumadin.   -Given recurrent falls, risk versus benefit of chronic anticoagulation needs to be discussed with the patient and her daughters.   -Continue Hold Coumadin at this time and repeat PT/INR in the morning.  - No evidence of bleeding on trauma scans.  Hemoglobin is stable.  A-fib currently rate controlled, continue Cardizem.   Rhabdomyolysis -In the setting of recent fall.  CK 1993 and is now improving and 968 -> 650 -Creatinine is at baseline.  Give gentle IV Fluid hydration and repeat CK in a.m.   Mild hyperkalemia -Patient's K+ Level Trend: Recent Labs  Lab 01/01/23 1505 01/02/23 0318 01/03/23 0752  K 5.3* 4.5 3.7  -In the setting of rhabdomyolysis -Getting IV fluid hydration and will need to continue monitor and hold lisinopril for this time. -Repeat CMP in the AM    Elevated transaminases/Abnormal LFTs -LFT/Hepatic Function Panel Trend Recent Labs  Lab 01/01/23 1505 01/02/23 0318 01/03/23 0752  AST 121* 82* 60*  ALT 73* 59* 51*  BILITOT 0.7 0.7 0.8  ALKPHOS 100 78 71  -Patient is not endorsing any GI symptoms.  CT showing hepatic steatosis but LFTs previously normal.   -Rhabdomyolysis likely contributing.  Repeat LFTs in AM.  Avoid hepatotoxic agents.   Acute T12 vertebral compression fracture Multilevel chronic vertebral compression fractures -CT thoracic spine showing acute generalized compression fracture of the T12 vertebral body with 30% height loss and 3 mm posterosuperior retropulsion.  Also showing chronic compression fractures of T10 and T11 with interval kyphoplasty.   -Chronic compression fractures of T1, T2, T3, and T4.   Chronic T12-L1 left paracentral calcified disc extrusion with cranial extension.  -CT of lumbar spine showing increased compression fracture of L3, now with 50% loss of anterior and central ventral body height and 30% loss of the posterior vertebral body height with posterosuperior L3 retropulsion causing moderate spinal canal stenosis.  Possible acute on chronic injury but radiologist suspects it is probably subacute or chronic.   -Patient is not endorsing any back pain at this time.  No neurologic deficit.   -Continue pain management as needed and outpatient follow-up with her spine surgeon.   -PT/OT eval, fall precautions.   Chronic right lung lower lobe collapse secondary to obstruction -CT chest showing rounded density in the right bronchus intermedius with complete low-density bronchial filling of the right lower lobe bronchi and complete right lower lobe collapse.  Acuity is uncertain but favored to be chronic.   -There is a 2.9 cm fluid density structure within the area of atelectatic lung in the right lower lobe.  This is nonspecific.  - Both neoplastic or infectious etiologies are considered.   -Patient will need pulmonology evaluation for bronchoscopy once INR improves further that it still remains slightly supratherapeutic.   CAD Troponin 18> 17 and  patient is not endorsing chest pain.  Continue cardiac monitoring.   Hypertension -Blood pressure stable.  Continue diltiazem 120 p.o. daily -Continue monitor blood pressures per protocol -Last blood pressure reading was 144/97   Hyponatremia -Mild. Na+ went from 135 -> 131 and is now 137 -C/w IVF hydration as above and renewed at 50 cc/h -Repeat CMP in the AM    Hyperlipidemia -Hold statin at this time in the setting of rhabdomyolysis/elevated liver enzymes. -Resume once improving further and close to normal   Depression -Continue with sertraline 100 mg p.o. daily   COPD/chronic bronchitis -Stable, no wheezing or shortness of  breath.  DuoNeb as needed every 6.   GERD -Continue PPI with pantoprazole 40 g p.o. daily.   Prediabetes -A1c 6.4 on 10/04/2022. Will repeat here -Continue to Monitor Blood Sugars per Protocol and monitor closely and if necessary will place on sensitive NovoLog/scale insulin AC; blood sugars ranging from 89-92 on daily BMP/CMP   Peripheral neuropathy -Continue gabapentin.   Hypoalbuminemia -Patient's Albumin Trend: Recent Labs  Lab 01/01/23 1505 01/02/23 0318 01/03/23 0752  ALBUMIN 3.1* 2.7* 2.5*  -Continue to Monitor and Trend and repeat CMP in the AM   Normocytic anemia -Hgb/Hct Trend: Recent Labs  Lab 01/01/23 1505 01/02/23 0318 01/03/23 0752  HGB 13.6 11.1* 10.8*  HCT 44.8 36.9 35.4*  MCV 85.5 85.2 85.3  -Anemia panel was checked and showed an iron level of 68, UIBC of 162, TIBC of 230, saturation ratios of 30%, ferritin level of 262, folate level 4.1, vitamin B12 423 -Will start the patient on folic acid supplement -Continue to Monitor for S/Sx of Bleeding; No overt bleeding noted -repeat CBC in the AM   DVT prophylaxis: SCDs    Code Status: DNR Family Communication: No family currently at bedside  Disposition Plan:  Level of care: Telemetry Status is: Inpatient Remains inpatient appropriate because: Needs further clinical improvement and will need pulmonary evaluation now that her INR is slowly trending down   Consultants:  None  Procedures:  As delineated above  Antimicrobials:  Anti-infectives (From admission, onward)    None       Subjective: Seen and examined at bedside and had some fatigue.  Felt a little bit better.  No nausea or vomiting.  Complaining about not having her proper dentures for her supplies from home.  No other complaints or concerns at this time and denies any chest pain or shortness of breath.  Denies any back pain to  Objective: Vitals:   01/02/23 1752 01/03/23 0059 01/03/23 0637 01/03/23 1311  BP: 121/77 121/66 139/78  (!) 144/97  Pulse: 97 (!) 104 92 (!) 103  Resp: '18 16 18 18  '$ Temp: 98 F (36.7 C) 97.9 F (36.6 C) 97.7 F (36.5 C) 98.5 F (36.9 C)  TempSrc:  Oral Oral   SpO2: 94% 95% 97% 96%  Weight:      Height:        Intake/Output Summary (Last 24 hours) at 01/03/2023 1952 Last data filed at 01/03/2023 1800 Gross per 24 hour  Intake 1307.62 ml  Output 200 ml  Net 1107.62 ml   Filed Weights   01/01/23 2235  Weight: 64.4 kg   Examination: Physical Exam:  Constitutional: WN/WD elderly Caucasian female currently in no acute distress but does appear little uncomfortable Respiratory: Diminished to auscultation bilaterally, no wheezing, rales, rhonchi or crackles. Normal respiratory effort and patient is not tachypenic. No accessory muscle use.  Unlabored breathing Cardiovascular: RRR, no murmurs /  rubs / gallops. S1 and S2 auscultated. No extremity edema. Abdomen: Soft, non-tender, non-distended. Bowel sounds positive.  GU: Deferred. Musculoskeletal: No clubbing / cyanosis of digits/nails. No joint deformity upper and lower extremities. Skin: No rashes, lesions, ulcers on limited skin evaluation. No induration; Warm and dry.  Neurologic: CN 2-12 grossly intact with no focal deficits. Romberg sign and cerebellar reflexes not assessed.  Psychiatric: Normal judgment and insight. Alert and oriented x 3.  Anxious mood and appropriate affect.   Data Reviewed: I have personally reviewed following labs and imaging studies  CBC: Recent Labs  Lab 01/01/23 1505 01/02/23 0318 01/03/23 0752  WBC 9.7 9.8 7.0  NEUTROABS 8.4*  --  5.2  HGB 13.6 11.1* 10.8*  HCT 44.8 36.9 35.4*  MCV 85.5 85.2 85.3  PLT 378 331 0000000   Basic Metabolic Panel: Recent Labs  Lab 01/01/23 1505 01/02/23 0318 01/03/23 0752  NA 135 131* 137  K 5.3* 4.5 3.7  CL 103 102 108  CO2 '25 22 23  '$ GLUCOSE 110* 92 89  BUN 26* 21 20  CREATININE 1.04* 0.83 0.74  CALCIUM 9.9 9.4 9.0  MG  --   --  1.8  PHOS  --   --  3.4    GFR: Estimated Creatinine Clearance: 42 mL/min (by C-G formula based on SCr of 0.74 mg/dL). Liver Function Tests: Recent Labs  Lab 01/01/23 1505 01/02/23 0318 01/03/23 0752  AST 121* 82* 60*  ALT 73* 59* 51*  ALKPHOS 100 78 71  BILITOT 0.7 0.7 0.8  PROT 8.2* 6.7 6.0*  ALBUMIN 3.1* 2.7* 2.5*   No results for input(s): "LIPASE", "AMYLASE" in the last 168 hours. No results for input(s): "AMMONIA" in the last 168 hours. Coagulation Profile: Recent Labs  Lab 01/01/23 1648 01/02/23 0318 01/03/23 0752  INR 5.6* 5.7* 3.2*   Cardiac Enzymes: Recent Labs  Lab 01/01/23 1505 01/02/23 0318 01/03/23 0752  CKTOTAL 1,993* 968* 650*   BNP (last 3 results) No results for input(s): "PROBNP" in the last 8760 hours. HbA1C: No results for input(s): "HGBA1C" in the last 72 hours. CBG: No results for input(s): "GLUCAP" in the last 168 hours. Lipid Profile: No results for input(s): "CHOL", "HDL", "LDLCALC", "TRIG", "CHOLHDL", "LDLDIRECT" in the last 72 hours. Thyroid Function Tests: No results for input(s): "TSH", "T4TOTAL", "FREET4", "T3FREE", "THYROIDAB" in the last 72 hours. Anemia Panel: Recent Labs    01/03/23 0752  VITAMINB12 423  FOLATE 4.1*  FERRITIN 262  TIBC 230*  IRON 68  RETICCTPCT 1.2   Sepsis Labs: No results for input(s): "PROCALCITON", "LATICACIDVEN" in the last 168 hours.  No results found for this or any previous visit (from the past 240 hour(s)).   Radiology Studies: No results found.  Scheduled Meds:  diltiazem  120 mg Oral Daily   gabapentin  300 mg Oral BID   pantoprazole  40 mg Oral Daily   sertraline  100 mg Oral Daily   Continuous Infusions:  sodium chloride 50 mL/hr at 01/03/23 1655    LOS: 1 day   Raiford Noble, DO [facility] Available via Epic secure chat 7am-7pm After these hours, please refer to coverage provider listed on [email or web address] 01/03/2023, 7:52 PM

## 2023-01-03 NOTE — Evaluation (Signed)
Occupational Therapy Evaluation Patient Details Name: [name] MRN: [id number] DOB: 10/25/1932 Today's Date: 01/03/2023   History of Present Illness Patient is a 89 year old female presents to the ED via EMS after a fall at home last night. Reportedly patient has been crawling around on the floor and complained of soreness of her legs and low back pain.   imaging revealed acute generalized compression fracture of the T12, multiple other  other compression fxs appear chronic per radiologist  PMH: CAD, permanent A-fib on Coumadin, mild to moderate MR, mild to moderate TR, hypertension, LVH, hyperlipidemia, pulmonary hypertension, arthritis, chronic back pain, depression, COPD and chronic bronchitis, PAD, GERD,  urge incontinence, macular degeneration, B12 deficiency.   Clinical Impression   Patient is a 89 year old female who was admitted for above. Patient was living in apartment independently at rollator level. Patient was noted to have poor safety awareness, decreased standing balance, decreased functional activity tolerance,increased fear of falling, and increased pain in back impacting participation in ADLs. Patient was noted to have a difficult time with idea of needing more assistance right now after being in bed more. Patient was educated on importance of being able to care for self prior to transitioning home alone. Patient verbalized understanding.Patient would continue to benefit from skilled OT services at this time while admitted and after d/c to address noted deficits in order to improve overall safety and independence in ADLs.       Recommendations for follow up therapy are one component of a multi-disciplinary discharge planning process, led by the attending physician.  Recommendations may be updated based on patient status, additional functional criteria and insurance authorization.   Follow Up Recommendations  Skilled nursing-short term rehab (<3 hours/day)     Assistance  Recommended at Discharge Frequent or constant Supervision/Assistance  Patient can return home with the following A little help with walking and/or transfers;A lot of help with bathing/dressing/bathroom;Assistance with cooking/housework;Direct supervision/assist for medications management;Assist for transportation;Help with stairs or ramp for entrance;Direct supervision/assist for financial management    Functional Status Assessment  Patient has had a recent decline in their functional status and demonstrates the ability to make significant improvements in function in a reasonable and predictable amount of time.  Equipment Recommendations  None recommended by OT       Precautions / Restrictions Precautions Precautions: Fall;Other (comment);Back Restrictions Weight Bearing Restrictions: No      Mobility Bed Mobility Overal bed mobility: Needs Assistance Bed Mobility: Supine to Sit, Sit to Supine     Supine to sit: Min guard Sit to supine: Min assist   General bed mobility comments: min A to get BLE back into bed and follow back preacutions.       Balance Overall balance assessment: Needs assistance Sitting-balance support: Single extremity supported, Feet supported Sitting balance-Leahy Scale: Good     Standing balance support: Reliant on assistive device for balance, During functional activity Standing balance-Leahy Scale: Poor       ADL either performed or assessed with clinical judgement   ADL Overall ADL's : Needs assistance/impaired Eating/Feeding: Sitting;Set up   Grooming: Wash/dry face;Set up;Sitting Grooming Details (indicate cue type and reason): EOB Upper Body Bathing: Minimal assistance;Sitting   Lower Body Bathing: Maximal assistance;Sitting/lateral leans   Upper Body Dressing : Minimal assistance;Sitting   Lower Body Dressing: Maximal assistance;Sitting/lateral leans   Toilet Transfer: Moderate assistance;Rolling walker (2 wheels);Stand-pivot Hydrographic surveyor Details (indicate cue type and reason): to transfer to [facility] and back with noted sliding  of feet with feet blocked with all sit to stands. Toileting- Clothing Manipulation and Hygiene: Moderate assistance;Sit to/from stand Toileting - Clothing Manipulation Details (indicate cue type and reason): with extreme kyphotic posture in standing attempting to wash bottom with poor safety awareness.             Vision Baseline Vision/History: 1 Wears glasses              Pertinent Vitals/Pain Pain Assessment Pain Assessment: No/denies pain        Extremity/Trunk Assessment Upper Extremity Assessment Upper Extremity Assessment: Generalized weakness (ROM WFL but noted to have kyphotic posture impacting ROM)   Lower Extremity Assessment Lower Extremity Assessment: Defer to PT evaluation   Cervical / Trunk Assessment Cervical / Trunk Assessment: Kyphotic   Communication Communication Communication: No difficulties   Cognition Arousal/Alertness: Awake/alert Behavior During Therapy: WFL for tasks assessed/performed Overall Cognitive Status: Within Functional Limits for tasks assessed       General Comments: patient is cooperative but noted to have poor safety awareness. patient requried increased education and encouragement to participate.                Home Living Family/patient expects to be discharged to:: Private residence Living Arrangements: Alone Available Help at Discharge: Available PRN/intermittently Type of Home: Apartment Home Access: Level entry     Home Layout: One level               Home Equipment: Rollator (4 wheels)          Prior Functioning/Environment Prior Level of Function : Independent/Modified Independent               ADLs Comments: patient reported being at home alone at rollator level prior to multiple falls. patient has PRN daughter support.        OT Problem List: Decreased activity tolerance;Impaired balance  (sitting and/or standing);Decreased coordination;Decreased knowledge of precautions;Decreased knowledge of use of DME or AE      OT Treatment/Interventions: Self-care/ADL training;Energy conservation;Therapeutic exercise;DME and/or AE instruction;Therapeutic activities;Patient/family education;Balance training    OT Goals(Current goals can be found in the care plan section) Acute Rehab OT Goals Patient Stated Goal: to get back home indepedently OT Goal Formulation: With patient Time For Goal Achievement: 01/17/23 Potential to Achieve Goals: Fair  OT Frequency: Min 2X/week       AM-PAC OT "6 Clicks" Daily Activity     Outcome Measure Help from another person eating meals?: A Little Help from another person taking care of personal grooming?: A Little Help from another person toileting, which includes using toliet, bedpan, or urinal?: A Lot Help from another person bathing (including washing, rinsing, drying)?: A Lot Help from another person to put on and taking off regular upper body clothing?: A Little Help from another person to put on and taking off regular lower body clothing?: A Lot 6 Click Score: 15   End of Session Equipment Utilized During Treatment: Gait belt;Rolling walker (2 wheels) Nurse Communication: Other (comment) (ok to participate in session)  Activity Tolerance: Patient tolerated treatment well Patient left: in bed;with call bell/phone within reach;with bed alarm set  OT Visit Diagnosis: Unsteadiness on feet (R26.81);Other abnormalities of gait and mobility (R26.89);Muscle weakness (generalized) (M62.81)                Time: [id number] OT Time Calculation (min): 32 min Charges:  OT General Charges $OT Visit: 1 Visit OT Evaluation $OT Eval Moderate Complexity: 1 Mod OT Treatments $Self Care/Home Management :  8-22 mins  Rennie Plowman, MS Acute Rehabilitation Department Office# [phone]   Willa Rough 01/03/2023, 3:55 PM

## 2023-01-04 ENCOUNTER — Ambulatory Visit: Payer: Medicare Other | Admitting: Family Medicine

## 2023-01-04 ENCOUNTER — Encounter (HOSPITAL_COMMUNITY): Payer: Self-pay | Admitting: Internal Medicine

## 2023-01-04 ENCOUNTER — Telehealth: Payer: Self-pay | Admitting: Cardiology

## 2023-01-04 DIAGNOSIS — R296 Repeated falls: Secondary | ICD-10-CM | POA: Diagnosis not present

## 2023-01-04 DIAGNOSIS — J9811 Atelectasis: Secondary | ICD-10-CM | POA: Diagnosis not present

## 2023-01-04 LAB — COMPREHENSIVE METABOLIC PANEL
ALT: 49 U/L — ABNORMAL HIGH (ref 0–44)
AST: 58 U/L — ABNORMAL HIGH (ref 15–41)
Albumin: 2.5 g/dL — ABNORMAL LOW (ref 3.5–5.0)
Alkaline Phosphatase: 66 U/L (ref 38–126)
Anion gap: 7 (ref 5–15)
BUN: 13 mg/dL (ref 8–23)
CO2: 23 mmol/L (ref 22–32)
Calcium: 8.9 mg/dL (ref 8.9–10.3)
Chloride: 107 mmol/L (ref 98–111)
Creatinine, Ser: 0.75 mg/dL (ref 0.44–1.00)
GFR, Estimated: >60 mL/min (ref >60)
Glucose, Bld: 99 mg/dL (ref 70–99)
Potassium: 3.3 mmol/L — ABNORMAL LOW (ref 3.5–5.1)
Sodium: 137 mmol/L (ref 135–145)
Total Bilirubin: 0.7 mg/dL (ref 0.3–1.2)
Total Protein: 5.7 g/dL — ABNORMAL LOW (ref 6.5–8.1)

## 2023-01-04 LAB — CBC WITH DIFFERENTIAL/PLATELET
Abs Immature Granulocytes: 0.06 10*3/uL (ref 0.00–0.07)
Basophils Absolute: 0.1 10*3/uL (ref 0.0–0.1)
Basophils Relative: 1 %
Eosinophils Absolute: 0.1 10*3/uL (ref 0.0–0.5)
Eosinophils Relative: 2 %
HCT: 32.7 % — ABNORMAL LOW (ref 36.0–46.0)
Hemoglobin: 9.8 g/dL — ABNORMAL LOW (ref 12.0–15.0)
Immature Granulocytes: 1 %
Lymphocytes Relative: 10 %
Lymphs Abs: 0.8 10*3/uL (ref 0.7–4.0)
MCH: 25.8 pg — ABNORMAL LOW (ref 26.0–34.0)
MCHC: 30 g/dL (ref 30.0–36.0)
MCV: 86.1 fL (ref 80.0–100.0)
Monocytes Absolute: 0.8 10*3/uL (ref 0.1–1.0)
Monocytes Relative: 10 %
Neutro Abs: 6.3 10*3/uL (ref 1.7–7.7)
Neutrophils Relative %: 76 %
Platelets: 290 10*3/uL (ref 150–400)
RBC: 3.8 MIL/uL — ABNORMAL LOW (ref 3.87–5.11)
RDW: 17.5 % — ABNORMAL HIGH (ref 11.5–15.5)
WBC: 8.1 10*3/uL (ref 4.0–10.5)
nRBC: 0 % (ref 0.0–0.2)

## 2023-01-04 LAB — PHOSPHORUS: Phosphorus: 2.9 mg/dL (ref 2.5–4.6)

## 2023-01-04 LAB — MAGNESIUM: Magnesium: 1.8 mg/dL (ref 1.7–2.4)

## 2023-01-04 LAB — PROTIME-INR
INR: 2.6 — ABNORMAL HIGH (ref 0.8–1.2)
Prothrombin Time: 27.5 seconds — ABNORMAL HIGH (ref 11.4–15.2)

## 2023-01-04 NOTE — Progress Notes (Signed)
[facility] PROGRESS NOTE  [name] (DOB: [birth year]) [id number] PCP: [name], MD (Inactive)  Brief Narrative: [name] is an 89 y.o. female with past medical history significant for but #2 CAD, permanent atrial fibrillation on anticoagulation with Coumadin, mild to moderate MR, mild to moderate TR, hypertension, LVH, hyperlipidemia, pulmonary hypertension, arthritis, chronic back pain, depression, COPD and chronic bronchitis, PAD, GERD, prediabetes, urge incontinence, macular degeneration, B12 deficiency as well as other comorbidities who presents to the ED via EMS after a fall around the night before last.  She reported had been crawling around on the floor and complained of soreness in her legs and low back.  On arrival to the ED she is awake and alert and oriented x 3 and answers all questions appropriately.  She underwent further lab work and testing.  Head CT and C-spine were negative for any acute findings.  CT of the chest was negative for any acute trauma but did show a rounded density in the right bronchus intermedius with complete density bronchial filling of the right lower lobe bronchi and complete right lower lobe collapse.  The acuity was unclear but it failed to be chronic.  The recommendation is for bronchoscopy but given that she had a supra therapeutic INR we will hold off on pulmonary consultation until her INR starts drifting downward.   CT scan of the thoracic spine showed acute generalized compression fracture T12 with vertebral body 30% height loss and 3 mm posterior superior retropulsion.  There is also noted to have chronic compression fractures of T10 and T8 along with interval kyphoplasty.  She is also noted to have chronic compression fractures of T1, T2, T3 and T4 as well as chronic T12-L1 left paracentral calcified disc extrusion with cranial extension.   A CT scan of the lumbar spine was done and showed increased compression fracture  of L3 now with 50% of anterior and central vertebral body height loss and 30% loss of the posterior vertebral body height with posterior superior L3 retropulsion causing moderate spinal canal stenosis.  This is felt to be possibly acute on chronic injury but the radiologist suspects subacute on chronic.   She normally ambulates with a walker but has chronic issues due to her balance due to peripheral neuropathy.  She continues to live alone and has frequent falls and had another fall and she was in the kitchen when she fell and slipped on her buttocks.  She presented for this and was admitted for further workup and PT OT recommending SNF.  She is currently getting IV fluid hydration for rhabdomyolysis and will continue today and will consult pulmonary for her CT scan findings once her INR dropped down further and is more therapeutic.   Patient is finally agreeable to SNF.  Continues to get IV fluid hydration  Subjective: No pain, wants to leave the hospital. Reluctantly agreeable to SNF rehab. Eating and drinking ok.   Objective: BP 122/67 (BP Location: Left Arm)   Pulse 84   Temp 98.2 F (36.8 C) (Oral)   Resp 20   Ht '5\' 4"'$  (1.626 m)   Wt 64.4 kg   SpO2 95%   BMI 24.37 kg/m   Gen: No distress, chronically frail appearing Pulm: Distant without wheezes or crackles  CV: Irreg irreg, no edema GI: Soft, NT, ND, +BS  Neuro: Alert and oriented. No new focal deficits. Ext: Warm, no deformities Skin: No rashes, lesions or ulcers on visualized skin   Assessment & Plan: Recurrent falls -  Appear to be related to chronic issues with balance in the setting of peripheral neuropathy, uses a walker to ambulate.   -She had another fall the other night which appears to be mechanical based on history. Patient denies head injury or loss of consciousness. Denies preceding lightheadedness/dizziness, chest pain, shortness of breath.   - She lives alone, cannot currently have 24/7 assistance. Would benefit  from rehabilitation at SNF to which the patient consents at this time. Will start insurance authorization.    Permanent A-fib: Rate controlled - Continue diltiazem, holding coumadin as below.  Rhabdomyolysis: In the setting of recent fall.  CK 1993 and is now improving and 968 -> 650. Creatinine is at baseline.  - Supportive care now.    Mild hyperkalemia: Resolved    Supratherapeutic INR: No evidence of bleeding on trauma scans.  Hemoglobin is stable. - Holding coumadin. In absence of bleeding, not reversing INR at this time. Consider discontinuing anticoagulation vs. changing to DOAC if cost concerns can be ameliorated.   Elevated transaminases/Abnormal LFTs: Improving steadily. Steatosis on imaging.   Acute T12 vertebral compression fracture on multilevel chronic vertebral compression fractures:  - CT thoracic spine showing acute generalized compression fracture of the T12 vertebral body with 30% height loss and 3 mm posterosuperior retropulsion.  Also showing chronic compression fractures of T10 and T11 with interval kyphoplasty.  Chronic compression fractures of T1, T2, T3, and T4.  Chronic T12-L1 left paracentral calcified disc extrusion with cranial extension.  - CT of lumbar spine showing increased compression fracture of L3, now with 50% loss of anterior and central ventral body height and 30% loss of the posterior vertebral body height with posterosuperior L3 retropulsion causing moderate spinal canal stenosis.  Possible acute on chronic injury but radiologist suspects it is probably subacute or chronic.   - Regarding T12 compression fracture, the patient has not had any back pain or neurological deficit, so kyphoplasty is not currently indicated.  - Continue pain management as needed and outpatient follow-up with her spine surgeon.   - PT/OT eval, fall precautions.   Chronic right lung lower lobe collapse secondary to obstruction: CT chest showing rounded density in the right  bronchus intermedius with complete low-density bronchial filling of the right lower lobe bronchi and complete right lower lobe collapse.  Acuity is uncertain but favored to be chronic.   -There is a 2.9 cm fluid density structure within the area of atelectatic lung in the right lower lobe.  This is nonspecific. Both neoplastic or infectious etiologies are considered.   - Pulmonary consulted, ordering repeat CXR and discussing bronchoscopy. INR would need to be < 2.   CAD: Troponin 18> 17 and patient is not endorsing chest pain.     Hypertension: Blood pressure stable.   - Continue diltiazem 120 p.o. daily   Hyponatremia: Resolved.   Hyperlipidemia - Holding statin at this time in the setting of rhabdomyolysis/elevated liver enzymes. - Resume once improving further and close to normal   Depression - Continue with sertraline 100 mg p.o. daily   COPD/chronic bronchitis: Stable, no wheezing or shortness of breath. Unclear diagnostic work up PTA.  - Defer work up to pulmonary   GERD - Continue PPI with pantoprazole 40 g p.o. daily.   Prediabetes: HbA1c 6.4% on 10/04/2022. At inpatient goal.   Peripheral neuropathy - Continue gabapentin.   Hypoalbuminemia: Supplement protein.  Normocytic anemia: Iron level of 68, UIBC of 162, TIBC of 230, saturation ratios of 30%, ferritin level of 262,  folate level 4.1, vitamin B12 [id number] - Started folic acid supplement   [name], MD [facility] [email or web address] 01/04/2023, 1:56 PM

## 2023-01-04 NOTE — Plan of Care (Signed)
  Problem: Coping: Goal: Level of anxiety will decrease Outcome: Progressing   Problem: Pain Managment: Goal: General experience of comfort will improve Outcome: Progressing   Problem: Safety: Goal: Ability to remain free from injury will improve Outcome: Progressing   

## 2023-01-04 NOTE — Progress Notes (Signed)
Occupational Therapy Treatment Patient Details Name: [name] MRN: [id number] DOB: [birth year] Today's Date: 01/04/2023   History of present illness Patient is a 89 year old female presents to the ED via EMS after a fall at home last night. Reportedly patient has been crawling around on the floor and complained of soreness of her legs and low back pain.   imaging revealed acute generalized compression fracture of the T12, multiple other  other compression fxs appear chronic per radiologist  PMH: CAD, permanent A-fib on Coumadin, mild to moderate MR, mild to moderate TR, hypertension, LVH, hyperlipidemia, pulmonary hypertension, arthritis, chronic back pain, depression, COPD and chronic bronchitis, PAD, GERD,  urge incontinence, macular degeneration, B12 deficiency.   OT comments  Patient was making progress towards goals with improved participation in toileting tasks. Patient continues to have poor safety awareness with attempt to sit prior to reaching commode with increased cues and education needed for problem solving when to sit down.Patient's discharge plan remains appropriate at this time. OT will continue to follow acutely.     Recommendations for follow up therapy are one component of a multi-disciplinary discharge planning process, led by the attending physician.  Recommendations may be updated based on patient status, additional functional criteria and insurance authorization.    Follow Up Recommendations  Skilled nursing-short term rehab (<3 hours/day)     Assistance Recommended at Discharge Frequent or constant Supervision/Assistance  Patient can return home with the following  A little help with walking and/or transfers;A lot of help with bathing/dressing/bathroom;Assistance with cooking/housework;Direct supervision/assist for medications management;Assist for transportation;Help with stairs or ramp for entrance;Direct supervision/assist for financial management   Equipment  Recommendations  None recommended by OT       Precautions / Restrictions Precautions Precautions: Fall;Back Restrictions Weight Bearing Restrictions: No       Mobility Bed Mobility Overal bed mobility: Needs Assistance Bed Mobility: Sit to Supine       Sit to supine: Min assist   General bed mobility comments: cues to log roll to prevent twisting.          Balance Overall balance assessment: Needs assistance Sitting-balance support: No upper extremity supported, Feet supported Sitting balance-Leahy Scale: Good     Standing balance support: Reliant on assistive device for balance, During functional activity Standing balance-Leahy Scale: Poor           ADL either performed or assessed with clinical judgement   ADL Overall ADL's : Needs assistance/impaired       Toilet Transfer: Minimal assistance;Ambulation;Rolling walker (2 wheels) Toilet Transfer Details (indicate cue type and reason): patiet needed cues for proper seqeuncing to back up to commode prior to attempting to doff undergarments. patient was noted to be two steps in front andf to L of toilet thinking she was ready to sit down. patient was educated on strategies to make sure she was in front of places prior to attempting to sit down. patient verbalized understanding.                  Cognition Arousal/Alertness: Awake/alert Behavior During Therapy: WFL for tasks assessed/performed Overall Cognitive Status: No family/caregiver present to determine baseline cognitive functioning       General Comments: patient is cooperative but noted to have poor safety awareness.                   Pertinent Vitals/ Pain       Pain Assessment Pain Assessment: Faces Faces Pain Scale: No hurt  Frequency  Min 2X/week        Progress Toward Goals  OT Goals(current goals can now be found in the care plan section)  Progress towards OT goals: Progressing toward goals     Plan Discharge  plan remains appropriate       AM-PAC OT "6 Clicks" Daily Activity     Outcome Measure   Help from another person eating meals?: A Little Help from another person taking care of personal grooming?: A Little Help from another person toileting, which includes using toliet, bedpan, or urinal?: A Lot Help from another person bathing (including washing, rinsing, drying)?: A Lot Help from another person to put on and taking off regular upper body clothing?: A Little Help from another person to put on and taking off regular lower body clothing?: A Lot 6 Click Score: 15    End of Session Equipment Utilized During Treatment: Gait belt;Rolling walker (2 wheels)  OT Visit Diagnosis: Unsteadiness on feet (R26.81);Other abnormalities of gait and mobility (R26.89);Muscle weakness (generalized) (M62.81)   Activity Tolerance Patient tolerated treatment well   Patient Left in bed;with call bell/phone within reach;with bed alarm set   Nurse Communication Mobility status        Time: [id number] OT Time Calculation (min): 26 min  Charges: OT General Charges $OT Visit: 1 Visit OT Treatments $Self Care/Home Management : 23-37 mins  Rennie Plowman, MS Acute Rehabilitation Department Office# [phone]   Willa Rough 01/04/2023, 3:43 PM

## 2023-01-04 NOTE — Progress Notes (Signed)
Physical Therapy Treatment Patient Details Name: [name] MRN: [id number] DOB: 21-Apr-1932 Today's Date: 01/04/2023   History of Present Illness Patient is a 89 year old female presents to the ED via EMS after a fall at home last night. Reportedly patient has been crawling around on the floor and complained of soreness of her legs and low back pain.   imaging revealed acute generalized compression fracture of the T12, multiple other  other compression fxs appear chronic per radiologist  PMH: CAD, permanent A-fib on Coumadin, mild to moderate MR, mild to moderate TR, hypertension, LVH, hyperlipidemia, pulmonary hypertension, arthritis, chronic back pain, depression, COPD and chronic bronchitis, PAD, GERD,  urge incontinence, macular degeneration, B12 deficiency.    PT Comments    Pt progressing toward PT goals. Improving tolerance to activity but does demonstrate gait deviations and decr balance placing pt at risk for continued falls. Requires cues throughout session for safety awareness. Will continue to follow in acute setting.  Pt will benefit from SNF    Recommendations for follow up therapy are one component of a multi-disciplinary discharge planning process, led by the attending physician.  Recommendations may be updated based on patient status, additional functional criteria and insurance authorization.  Follow Up Recommendations  Skilled nursing-short term rehab (<3 hours/day) Can patient physically be transported by private vehicle: Yes   Assistance Recommended at Discharge Intermittent Supervision/Assistance  Patient can return home with the following A little help with walking and/or transfers;A little help with bathing/dressing/bathroom;Help with stairs or ramp for entrance;Assist for transportation   Equipment Recommendations  None recommended by PT    Recommendations for Other Services       Precautions / Restrictions Precautions Precautions:  Fall;Back Restrictions Weight Bearing Restrictions: No     Mobility  Bed Mobility   Bed Mobility: Supine to Sit     Supine to sit: Min guard     General bed mobility comments: incr time, cues to bring LEs off bed, HOB elevated ~ 65 degrees    Transfers Overall transfer level: Needs assistance Equipment used: Rolling walker (2 wheels) Transfers: Sit to/from Stand Sit to Stand: Min guard, Min assist           General transfer comment: cues for knee flexion, hand placement and to control descent. pt prefers to pull up on RW (uses rollator at home)    Ambulation/Gait Ambulation/Gait assistance: Min guard Gait Distance (Feet): 70 Feet Assistive device: Rolling walker (2 wheels) Gait Pattern/deviations: Narrow base of support, Trunk flexed Gait velocity: decr     General Gait Details: cues to attempt incr trunk extension and maintain closer proximity RW to self   Stairs             Wheelchair Mobility    Modified Rankin (Stroke Patients Only)       Balance   Sitting-balance support: No upper extremity supported, Feet supported Sitting balance-Leahy Scale: Good     Standing balance support: Reliant on assistive device for balance, During functional activity Standing balance-Leahy Scale: Poor                              Cognition Arousal/Alertness: Awake/alert Behavior During Therapy: WFL for tasks assessed/performed Overall Cognitive Status: No family/caregiver present to determine baseline cognitive functioning Area of Impairment: Memory, Following commands, Safety/judgement                     Memory: Decreased short-term memory  Following Commands: Follows one step commands consistently Safety/Judgement: Decreased awareness of safety     General Comments: patient is cooperative but noted to have poor safety awareness. patient required increased education and encouragement to participate. 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"        Exercises      General Comments        Pertinent Vitals/Pain Pain Assessment Pain Assessment: Faces Faces Pain Scale: No hurt Pain Intervention(s): Monitored during session    Home Living                          Prior Function            PT Goals (current goals can now be found in the care plan section) Acute Rehab PT Goals PT Goal Formulation: With patient Time For Goal Achievement: 01/16/23 Potential to Achieve Goals: Good Progress towards PT goals: Progressing toward goals    Frequency    Min 2X/week      PT Plan Current plan remains appropriate    Co-evaluation              AM-PAC PT "6 Clicks" Mobility   Outcome Measure  Help needed turning from your back to your side while in a flat bed without using bedrails?: A Little Help needed moving from lying on your back to sitting on the side of a flat bed without using bedrails?: A Little Help needed moving to and from a bed to a chair (including a wheelchair)?: A Little Help needed standing up from a chair using your arms (e.g., wheelchair or bedside chair)?: A Little Help needed to walk in hospital room?: A Little Help needed climbing 3-5 steps with a railing? : A Little 6 Click Score: 18    End of Session Equipment Utilized During Treatment: Gait belt Activity Tolerance: Patient tolerated treatment well Patient left: in chair;with call bell/phone within reach;with chair alarm set Nurse Communication: Mobility status PT Visit Diagnosis: Other abnormalities of gait and mobility (R26.89);Repeated falls (R29.6)     Time: [id number] PT Time Calculation (min) (ACUTE ONLY): 26 min  Charges:  $Gait Training: 23-37 mins                     Baxter Flattery, PT  Acute Rehab Dept [facility]) [phone]  [facility] Weekend Pager [facility] only)  [phone]  01/04/2023    [facility] 01/04/2023, 12:48 PM

## 2023-01-04 NOTE — Consult Note (Signed)
NAME:  [name], MRN:  [id number], DOB:  10-24-1932, LOS: 2 ADMISSION DATE:  01/01/2023, CONSULTATION DATE:  01/04/2023 REFERRING MD:  Dr. Bonner Puna, [facility], CHIEF COMPLAINT:  Abnormal CT chest   History of Present Illness:  89 yo female brought to [facility] ER after falling at home.  She was admitted by hospitalists with mild rhabdomyolysis, hyperkalemia, and acute T12 vertebral compression fracture.  CT chest imaging showed obstructing lesion in Rt bronchus intermedius and Rt lower lobe bronchus with atelectasis of Rt lower lobe.  Only prior imaging studies in our system for comparison from 2011.  PCCM consulted to assess whether bronchoscopy is needed.  Pertinent  Medical History  Arthritis, Back pain, CAD, Depression, HLD, HTN, HFpEF, Permanent A fib on coumadin, Pre-DM, GERD, Neuropathy, Osteopenia, Mitral regurgitation  Studies:   CT chest 01/01/23 >> atherosclerosis, mod HH, density in Rt bronchus intermedius with completed low density bronchial filling in RLLL bronchi and complete RLL collapse with 2.9 cm fluid density in area of atelectasis, subpleural reticulation and GGO in periphery with basilar predominance  Interim History / Subjective:  No history of smoking.  Has occasional cough from throat tickle.  Denies sputum, chest pain, or hemoptysis.  Denies difficulty swallowing.  Objective   BP 122/67 (BP Location: Left Arm)   Pulse 84   Temp 98.2 F (36.8 C) (Oral)   Resp 20   Ht '5\' 4"'$  (1.626 m)   Wt 64.4 kg   SpO2 95%   BMI 24.37 kg/m    Intake/Output Summary (Last 24 hours) at 01/04/2023 0920 Last data filed at 01/04/2023 0600 Gross per 24 hour  Intake 1395.22 ml  Output 500 ml  Net 895.22 ml   Filed Weights   01/01/23 2235  Weight: 64.4 kg    Examination:  General - alert, frail Eyes - pupils reactive ENT - no sinus tenderness, no stridor Cardiac - regular rate/rhythm, 2/6 SM Chest - decreased BS at bases with scattered rhonchi Abdomen - soft, non tender, +  bowel sounds Extremities - no cyanosis, clubbing, or edema Skin - no rashes Neuro - moves extremities, follows commands Psych - normal mood and behavior   Assessment & Plan:   Rt lower lobe collapse with obstructing lesion in Rt lower lobe bronchus. - will repeat chest xray today - discussed possible need for bronchoscopy; she is not certain whether she would want to have a procedure - if she does need bronchoscopy and is agreeable, would need to have INR < 2  - she has been labeled as having COPD in chart >> no history of tobacco abuse and no PFTs in chart; uncertain how this diagnosis was made  Recurrent falls with thoracic spine compression fracture. Coronary artery disease. Hypertension. Hyperlipidemia. Permanent A fib. Moderate mitral regurgitation. Rhabdomyolysis. Hyponatremia. Hyperkalemia. Elevated LFTs. Depression. GERD. Neuropathy. - per primary team  Goals of care. - DNR/DNI  Labs   CBC: Recent Labs  Lab 01/01/23 1505 01/02/23 0318 01/03/23 0752 01/04/23 0549  WBC 9.7 9.8 7.0 8.1  NEUTROABS 8.4*  --  5.2 6.3  HGB 13.6 11.1* 10.8* 9.8*  HCT 44.8 36.9 35.4* 32.7*  MCV 85.5 85.2 85.3 86.1  PLT 378 331 296 [id number]    Basic Metabolic Panel: Recent Labs  Lab 01/01/23 1505 01/02/23 0318 01/03/23 0752 01/04/23 0549  NA 135 131* 137 137  K 5.3* 4.5 3.7 3.3*  CL 103 102 108 107  CO2 '25 22 23 23  '$ GLUCOSE 110* 92 89 99  BUN 26* 21  20 13  CREATININE 1.04* 0.83 0.74 0.75  CALCIUM 9.9 9.4 9.0 8.9  MG  --   --  1.8 1.8  PHOS  --   --  3.4 2.9   GFR: Estimated Creatinine Clearance: 42 mL/min (by C-G formula based on SCr of 0.75 mg/dL). Recent Labs  Lab 01/01/23 1505 01/02/23 0318 01/03/23 0752 01/04/23 0549  WBC 9.7 9.8 7.0 8.1    Liver Function Tests: Recent Labs  Lab 01/01/23 1505 01/02/23 0318 01/03/23 0752 01/04/23 0549  AST 121* 82* 60* 58*  ALT 73* 59* 51* 49*  ALKPHOS 100 78 71 66  BILITOT 0.7 0.7 0.8 0.7  PROT 8.2* 6.7 6.0* 5.7*   ALBUMIN 3.1* 2.7* 2.5* 2.5*   No results for input(s): "LIPASE", "AMYLASE" in the last 168 hours. No results for input(s): "AMMONIA" in the last 168 hours.  ABG    Component Value Date/Time   PHART 7.424 (H) 02/03/2009 0858   PCO2ART 37.3 02/03/2009 0858   PO2ART 60.0 (L) 02/03/2009 0858   HCO3 24.4 (H) 02/03/2009 0858   TCO2 31 03/01/2013 1100   ACIDBASEDEF 1.0 02/03/2009 0844   O2SAT 91.0 02/03/2009 0858     Coagulation Profile: Recent Labs  Lab 01/01/23 1648 01/02/23 0318 01/03/23 0752 01/04/23 0549  INR 5.6* 5.7* 3.2* 2.6*    Cardiac Enzymes: Recent Labs  Lab 01/01/23 1505 01/02/23 0318 01/03/23 0752  CKTOTAL 1,993* 968* 650*    HbA1C: Hgb A1c MFr Bld  Date/Time Value Ref Range Status  10/04/2022 11:40 AM 6.4 (H) 4.8 - 5.6 % Final    Comment:             Prediabetes: 5.7 - 6.4          Diabetes: >6.4          Glycemic control for adults with diabetes: <7.0   06/28/2022 11:34 AM 6.2 (H) 4.8 - 5.6 % Final    Comment:             Prediabetes: 5.7 - 6.4          Diabetes: >6.4          Glycemic control for adults with diabetes: <7.0     CBG: No results for input(s): "GLUCAP" in the last 168 hours.  Review of Systems:   Reviewed and negative  Past Medical History:  She,  has a past medical history of Arthritis, Back pain, chronic, Coronary artery disease (CARDIOLOGIST- DR [name]--  LOV IN EPIC), Depression, Hammer toe, Hypercholesterolemia, Hypertensive heart disease, LVH (left ventricular hypertrophy) due to hypertensive disease, Mitral insufficiency, Pulmonary hypertension ([place]), and Transfusion history.   Surgical History:   Past Surgical History:  Procedure Laterality Date   ABDOMINAL AORTOGRAM W/LOWER EXTREMITY N/A 04/29/2020   Procedure: ABDOMINAL AORTOGRAM W/LOWER EXTREMITY;  Surgeon: [name], MD;  Location: [facility];  Service: Cardiovascular;  Laterality: N/A;  Lt Leg   ABDOMINAL HYSTERECTOMY     ANTERIOR REPAIR/ [place]  PUBOVAGINAL SLING  05-03-2002   BUNIONECTOMY WITH HAMMERTOE RECONSTRUCTION Right 03/01/2013   Procedure: RIGHT FOOT EXCISION BUNIONETTE AND FUSION OF DIP FOURTH TOE;  Surgeon: [name], MD;  Location: [facility];  Service: Orthopedics;  Laterality: Right;   CARDIAC CATHETERIZATION  02-03-2009  DR  [name]   SINGLE-VESSEL OBSTRUCTIVE CAD, SMALL DIAGONAL BRANCH/ NORMAL LVF/ SEVERE MITRAL INSUFFICIENCY/ SEVERE PULMONARY HYPERTENSION   CATARACT EXTRACTION W/ INTRAOCULAR LENS  IMPLANT, BILATERAL  2003   COLONOSCOPY  07/25/2014   few mall  scattered diverticula in the ectire examined colon. Small internal hemmorhoids.    HAMMER TOE SURGERY  02/28/2012   Procedure: HAMMER TOE CORRECTION;  Surgeon: [name], MD;  Location: [facility];  Service: Orthopedics;  Laterality: Right;  FUSION OF PROXIMAL INTERPHALANGEAL  RIGHT THIRD CLAW TOE   PARS PLANA VITRECTOMY W/ REPAIR OF MACULAR HOLE  02-04-2002   LEFT EYE   PERIPHERAL VASCULAR BALLOON ANGIOPLASTY  04/29/2020   Procedure: PERIPHERAL VASCULAR BALLOON ANGIOPLASTY;  Surgeon: [name], MD;  Location: [facility];  Service: Cardiovascular;;  Lt. AT   REPAIR RIGHT SECOND CLAW TOE/ VARUS MEDIAL CLOSING WEDGE OSTEOTOMY PROXIMAL PHALANX RIGHT GREAT TOE  05-05-2005   TOTAL HIP ARTHROPLASTY     BILATERAL---  LEFT 4/98;   RIGHT  11/98   VARUS OSTEOTOMY PROXIMAL PHALANX, LEFT GREAT TOE/ PARING DOWN THE CALLUS , SECOND LEFT TOE  10-27-2008     Social History:   reports that she has never smoked. She has never used smokeless tobacco. She reports that she does not currently use alcohol after a past usage of about 1.0 standard drink of alcohol per week. She reports current drug use. Drug: Hydrocodone.   Family History:  Her family history includes Heart disease (age of onset: 55) in her mother; Prostate cancer (age of onset: 35) in her father.   Allergies Allergies  Allergen Reactions   Sulfamethoxazole      Other reaction(s): Other (See Comments) Not listed   Sulfanilamide     unkn Other reaction(s): Other (See Comments) unkn   Sulfur Dioxide     Other reaction(s): Other (See Comments)   Penicillins Rash     Home Medications  Prior to Admission medications   Medication Sig Start Date End Date Taking? Authorizing Provider  ascorbic acid (VITAMIN C) 500 MG tablet Take 600 mg by mouth daily.    Yes [provider]  atorvastatin (LIPITOR) 80 MG tablet Take 1 tablet (80 mg total) by mouth daily. 06/01/22 01/01/23 Yes [name], Peter M, MD  Biotin 1000 MCG tablet Take 1 tablet by mouth daily.    Yes [provider]  Calcium Carbonate-Vitamin D (CALCIUM + D PO) Take by mouth daily.   Yes [provider]  Cholecalciferol (VITAMIN D PO) Take by mouth daily.   Yes [provider]  diltiazem (CARDIZEM CD) 120 MG 24 hr capsule Take 120 mg by mouth daily. 02/24/22  Yes [provider]  diltiazem (TIAZAC) 120 MG 24 hr capsule TAKE ONE CAPSULE BY MOUTH DAILY 01/03/23   [name], Peter M, MD  gabapentin (NEURONTIN) 300 MG capsule Take 300 mg by mouth 2 (two) times daily. 03/16/22  Yes [provider]  lisinopril (ZESTRIL) 20 MG tablet Take 1 tablet (20 mg total) by mouth daily. 06/22/22  Yes [name], MD  pantoprazole (PROTONIX) 40 MG tablet Take 40 mg by mouth daily. 02/20/20  Yes [provider]  potassium chloride SA (KLOR-CON M) 20 MEQ tablet TAKE ONE TABLET BY MOUTH TWICE DAILY Patient taking differently: 20 mEq 2 (two) times daily. 06/20/22  Yes [name], Peter M, MD  sertraline (ZOLOFT) 100 MG tablet Take 1 tablet (100 mg total) by mouth daily. 11/16/22  Yes Cox, Kirsten, MD  vitamin A 8000 UNIT capsule Take 8,000 Units by mouth daily.   Yes [provider]  warfarin (COUMADIN) 2.5 MG tablet Take 1/2 tablet to 1 tablet daily by mouth or as directed by Anticoagulation Clinic. Patient taking differently:  Takes 1/2 tablet on  Monday and Friday and 1 tablet all other days 11/21/22  Yes [name], Peter M, MD  Zinc Picolinate POWD Take by mouth.   Yes [provider]  cephALEXin (KEFLEX) 500 MG capsule Take 1 capsule (500 mg total) by mouth 2 (two) times daily. Patient not taking: Reported on 01/01/2023 06/28/22   [name], MD  cyanocobalamin (,VITAMIN B-12,) 1000 MCG/ML injection Inject 1 mL (1,000 mcg total) into the muscle every 30 (thirty) days. Patient not taking: Reported on 01/01/2023 09/14/21   [name], MD  oxybutynin (DITROPAN) 5 MG tablet Take 1 tablet (5 mg total) by mouth 3 (three) times daily. Patient not taking: Reported on 01/01/2023 10/20/22   [name], MD  oxyCODONE-acetaminophen (PERCOCET) 10-325 MG tablet Take 1 tablet by mouth 2 (two) times daily as needed. Patient not taking: Reported on 01/01/2023 02/16/22   [provider]     Signature:  Chesley Mires, MD [place] Pager - [phone] or [phone] 01/04/2023, 9:42 AM

## 2023-01-04 NOTE — Plan of Care (Signed)
  Problem: Education: Goal: Knowledge of General Education information will improve Description: Including pain rating scale, medication(s)/side effects and non-pharmacologic comfort measures Outcome: Progressing   Problem: Activity: Goal: Risk for activity intolerance will decrease Outcome: Progressing   Problem: Pain Managment: Goal: General experience of comfort will improve Outcome: Progressing   

## 2023-01-04 NOTE — Telephone Encounter (Signed)
Pt called in to speak to RN or Dr. [name]. Did not wish disclose what it was about.

## 2023-01-04 NOTE — TOC Progression Note (Addendum)
Transition of Care [facility]) - Progression Note    Patient Details  Name: [name] MRN: [id number] Date of Birth: 20-Jun-1932  [facility]) CM/SW Contact  Lennart Pall, LCSW Phone Number: 01/04/2023, 1:11 PM  Clinical Narrative:     Met with pt today to review SNF bed offers and pt has accepted bed at [facility].  MD notes pt may be medically ready for dc tomorrow.  Have begun insurance authorization and have updated pt's daughter, [name].  ADDENDUM: Have received ins Candace Cruise auth# [id number] (good 2/29- 3/4)  Expected Discharge Plan: [place] (vs. SNF) Barriers to Discharge: Continued Medical Work up  Expected Discharge Plan and Services In-house Referral: Clinical Social Work     Living arrangements for the past 2 months: Apartment                                       Social Determinants of Health (SDOH) Interventions SDOH Screenings   Food Insecurity: No Food Insecurity (01/02/2023)  Housing: Low Risk  (01/02/2023)  Recent Concern: Housing - Medium Risk (12/28/2022)  Transportation Needs: No Transportation Needs (01/02/2023)  Utilities: Not At Risk (01/02/2023)  Alcohol Screen: Low Risk  (03/02/2022)  Depression (PHQ2-9): Low Risk  (03/02/2022)  Financial Resource Strain: High Risk (12/28/2022)  Physical Activity: Inactive (05/17/2022)  Stress: Stress Concern Present (03/02/2022)  Tobacco Use: Low Risk  (01/04/2023)    Readmission Risk Interventions     No data to display

## 2023-01-05 ENCOUNTER — Inpatient Hospital Stay (HOSPITAL_COMMUNITY): Payer: Medicare Other

## 2023-01-05 DIAGNOSIS — J9811 Atelectasis: Secondary | ICD-10-CM | POA: Diagnosis not present

## 2023-01-05 DIAGNOSIS — R296 Repeated falls: Secondary | ICD-10-CM | POA: Diagnosis not present

## 2023-01-05 LAB — PROTIME-INR
INR: 2.2 — ABNORMAL HIGH (ref 0.8–1.2)
Prothrombin Time: 24.3 seconds — ABNORMAL HIGH (ref 11.4–15.2)

## 2023-01-05 MED ORDER — ORAL CARE MOUTH RINSE: 15.0000 mL | OROMUCOSAL | Status: DC | PRN

## 2023-01-05 NOTE — Progress Notes (Signed)
NAME:  [name], MRN:  [id number], DOB:  Dec 25, 1931, LOS: 3 ADMISSION DATE:  01/01/2023, CONSULTATION DATE:  01/04/2023 REFERRING MD:  Dr. Bonner Puna, [facility], CHIEF COMPLAINT:  Abnormal CT chest   History of Present Illness:  89 yo female brought to [facility] ER after falling at home.  She was admitted by hospitalists with mild rhabdomyolysis, hyperkalemia, and acute T12 vertebral compression fracture.  CT chest imaging showed obstructing lesion in Rt bronchus intermedius and Rt lower lobe bronchus with atelectasis of Rt lower lobe.  Only prior imaging studies in our system for comparison from 2011.  PCCM consulted to assess whether bronchoscopy is needed.  Pertinent  Medical History  Arthritis, Back pain, CAD, Depression, HLD, HTN, HFpEF, Permanent A fib on coumadin, Pre-DM, GERD, Neuropathy, Osteopenia, Mitral regurgitation  Studies:   CT chest 01/01/23 >> atherosclerosis, mod HH, density in Rt bronchus intermedius with completed low density bronchial filling in RLLL bronchi and complete RLL collapse with 2.9 cm fluid density in area of atelectasis, subpleural reticulation and GGO in periphery with basilar predominance  Interim History / Subjective:  Still has cough.  Not much phlegm and no hemoptysis.  Not having chest pain.  Objective   BP 109/68 (BP Location: Left Arm)   Pulse 93   Temp 98.1 F (36.7 C)   Resp 17   Ht '5\' 4"'$  (1.626 m)   Wt 64.4 kg   SpO2 95%   BMI 24.37 kg/m    Intake/Output Summary (Last 24 hours) at 01/05/2023 1408 Last data filed at 01/05/2023 0900 Gross per 24 hour  Intake 240 ml  Output 600 ml  Net -360 ml   Filed Weights   01/01/23 2235  Weight: 64.4 kg    Examination:  General - alert Eyes - pupils reactive ENT - no sinus tenderness, no stridor, poor hearing acuity Cardiac - regular rate/rhythm, 2/6 SM Chest - decreased BS Rt base Abdomen - soft, non tender, + bowel sounds Extremities - no cyanosis, clubbing, or edema Skin - no rashes Neuro -  normal strength, moves extremities, follows commands Psych - normal mood and behavior  Assessment & Plan:   Rt lower lobe collapse with obstructing lesion in Rt lower lobe bronchus. - has persistent lung collapse on CXR from 2/29 - d/w pt and her daughter, [name], about need for bronchoscopy - risks of procedure detailed as bleeding, infection, pneumothorax and non diagnosis - they are agreeable to proceed with bronchoscopy - will tentatively plan for this on 01/06/23 assuming her INR is < 2 - she has been labeled as having COPD in chart >> no history of tobacco abuse and no PFTs in chart; uncertain how this diagnosis was made  Recurrent falls with thoracic spine compression fracture. Coronary artery disease. Hypertension. Hyperlipidemia. Permanent A fib. Moderate mitral regurgitation. Rhabdomyolysis. Hyponatremia. Hyperkalemia. Elevated LFTs. Depression. GERD. Neuropathy. - per primary team  Goals of care. - DNR/DNI  Labs   CBC: Recent Labs  Lab 01/01/23 1505 01/02/23 0318 01/03/23 0752 01/04/23 0549  WBC 9.7 9.8 7.0 8.1  NEUTROABS 8.4*  --  5.2 6.3  HGB 13.6 11.1* 10.8* 9.8*  HCT 44.8 36.9 35.4* 32.7*  MCV 85.5 85.2 85.3 86.1  PLT 378 331 296 [id number]    Basic Metabolic Panel: Recent Labs  Lab 01/01/23 1505 01/02/23 0318 01/03/23 0752 01/04/23 0549  NA 135 131* 137 137  K 5.3* 4.5 3.7 3.3*  CL 103 102 108 107  CO2 '25 22 23 23  '$ GLUCOSE 110* 92 89  99  BUN 26* '21 20 13  '$ CREATININE 1.04* 0.83 0.74 0.75  CALCIUM 9.9 9.4 9.0 8.9  MG  --   --  1.8 1.8  PHOS  --   --  3.4 2.9   GFR: Estimated Creatinine Clearance: 42 mL/min (by C-G formula based on SCr of 0.75 mg/dL). Recent Labs  Lab 01/01/23 1505 01/02/23 0318 01/03/23 0752 01/04/23 0549  WBC 9.7 9.8 7.0 8.1    Liver Function Tests: Recent Labs  Lab 01/01/23 1505 01/02/23 0318 01/03/23 0752 01/04/23 0549  AST 121* 82* 60* 58*  ALT 73* 59* 51* 49*  ALKPHOS 100 78 71 66  BILITOT 0.7 0.7 0.8 0.7   PROT 8.2* 6.7 6.0* 5.7*  ALBUMIN 3.1* 2.7* 2.5* 2.5*   No results for input(s): "LIPASE", "AMYLASE" in the last 168 hours. No results for input(s): "AMMONIA" in the last 168 hours.  ABG    Component Value Date/Time   PHART 7.424 (H) 02/03/2009 0858   PCO2ART 37.3 02/03/2009 0858   PO2ART 60.0 (L) 02/03/2009 0858   HCO3 24.4 (H) 02/03/2009 0858   TCO2 31 03/01/2013 1100   ACIDBASEDEF 1.0 02/03/2009 0844   O2SAT 91.0 02/03/2009 0858     Coagulation Profile: Recent Labs  Lab 01/01/23 1648 01/02/23 0318 01/03/23 0752 01/04/23 0549 01/05/23 0324  INR 5.6* 5.7* 3.2* 2.6* 2.2*    Cardiac Enzymes: Recent Labs  Lab 01/01/23 1505 01/02/23 0318 01/03/23 0752  CKTOTAL 1,993* 968* 650*    HbA1C: Hgb A1c MFr Bld  Date/Time Value Ref Range Status  10/04/2022 11:40 AM 6.4 (H) 4.8 - 5.6 % Final    Comment:             Prediabetes: 5.7 - 6.4          Diabetes: >6.4          Glycemic control for adults with diabetes: <7.0   06/28/2022 11:34 AM 6.2 (H) 4.8 - 5.6 % Final    Comment:             Prediabetes: 5.7 - 6.4          Diabetes: >6.4          Glycemic control for adults with diabetes: <7.0     CBG: No results for input(s): "GLUCAP" in the last 168 hours.  Review of Systems:   Reviewed and negative  Past Medical History:  She,  has a past medical history of Arthritis, Back pain, chronic, Coronary artery disease (CARDIOLOGIST- DR [name]--  LOV IN EPIC), Depression, Hammer toe, Hypercholesterolemia, Hypertensive heart disease, LVH (left ventricular hypertrophy) due to hypertensive disease, Mitral insufficiency, Mitral regurgitation, Permanent atrial fibrillation ([place]), Pulmonary hypertension ([place]), Recurrent falls, and Transfusion history.   Surgical History:   Past Surgical History:  Procedure Laterality Date   ABDOMINAL AORTOGRAM W/LOWER EXTREMITY N/A 04/29/2020   Procedure: ABDOMINAL AORTOGRAM W/LOWER EXTREMITY;  Surgeon: [name], MD;  Location: [facility];  Service: Cardiovascular;  Laterality: N/A;  Lt Leg   ABDOMINAL HYSTERECTOMY     ANTERIOR REPAIR/ [place] PUBOVAGINAL SLING  05-03-2002   BUNIONECTOMY WITH HAMMERTOE RECONSTRUCTION Right 03/01/2013   Procedure: RIGHT FOOT EXCISION BUNIONETTE AND FUSION OF DIP FOURTH TOE;  Surgeon: [name], MD;  Location: [facility];  Service: Orthopedics;  Laterality: Right;   CARDIAC CATHETERIZATION  02-03-2009  DR  [name]   SINGLE-VESSEL OBSTRUCTIVE CAD, SMALL DIAGONAL BRANCH/ NORMAL LVF/ SEVERE MITRAL INSUFFICIENCY/ SEVERE PULMONARY HYPERTENSION   CATARACT EXTRACTION W/  INTRAOCULAR LENS  IMPLANT, BILATERAL  2003   COLONOSCOPY  07/25/2014   few mall scattered diverticula in the ectire examined colon. Small internal hemmorhoids.    HAMMER TOE SURGERY  02/28/2012   Procedure: HAMMER TOE CORRECTION;  Surgeon: [name], MD;  Location: [facility];  Service: Orthopedics;  Laterality: Right;  FUSION OF PROXIMAL INTERPHALANGEAL  RIGHT THIRD CLAW TOE   PARS PLANA VITRECTOMY W/ REPAIR OF MACULAR HOLE  02-04-2002   LEFT EYE   PERIPHERAL VASCULAR BALLOON ANGIOPLASTY  04/29/2020   Procedure: PERIPHERAL VASCULAR BALLOON ANGIOPLASTY;  Surgeon: [name], MD;  Location: [facility] [facility];  Service: Cardiovascular;;  Lt. AT   REPAIR RIGHT SECOND CLAW TOE/ VARUS MEDIAL CLOSING WEDGE OSTEOTOMY PROXIMAL PHALANX RIGHT GREAT TOE  05-05-2005   TOTAL HIP ARTHROPLASTY     BILATERAL---  LEFT 4/98;   RIGHT  11/98   VARUS OSTEOTOMY PROXIMAL PHALANX, LEFT GREAT TOE/ PARING DOWN THE CALLUS , SECOND LEFT TOE  10-27-2008     Social History:   reports that she has never smoked. She has never used smokeless tobacco. She reports that she does not currently use alcohol after a past usage of about 1.0 standard drink of alcohol per week. She reports current drug use. Drug: Hydrocodone.   Family History:  Her family history includes Heart disease (age of onset: 44) in her  mother; Prostate cancer (age of onset: 59) in her father.   Allergies Allergies  Allergen Reactions   Sulfamethoxazole     Other reaction(s): Other (See Comments) Not listed   Sulfanilamide     unkn Other reaction(s): Other (See Comments) unkn   Sulfur Dioxide     Other reaction(s): Other (See Comments)   Penicillins Rash     Home Medications  Prior to Admission medications   Medication Sig Start Date End Date Taking? Authorizing Provider  ascorbic acid (VITAMIN C) 500 MG tablet Take 600 mg by mouth daily.    Yes [provider]  atorvastatin (LIPITOR) 80 MG tablet Take 1 tablet (80 mg total) by mouth daily. 06/01/22 01/01/23 Yes [name], Peter M, MD  Biotin 1000 MCG tablet Take 1 tablet by mouth daily.    Yes [provider]  Calcium Carbonate-Vitamin D (CALCIUM + D PO) Take by mouth daily.   Yes [provider]  Cholecalciferol (VITAMIN D PO) Take by mouth daily.   Yes [provider]  diltiazem (CARDIZEM CD) 120 MG 24 hr capsule Take 120 mg by mouth daily. 02/24/22  Yes [provider]  diltiazem (TIAZAC) 120 MG 24 hr capsule TAKE ONE CAPSULE BY MOUTH DAILY 01/03/23   [name], Peter M, MD  gabapentin (NEURONTIN) 300 MG capsule Take 300 mg by mouth 2 (two) times daily. 03/16/22  Yes [provider]  lisinopril (ZESTRIL) 20 MG tablet Take 1 tablet (20 mg total) by mouth daily. 06/22/22  Yes [name], MD  pantoprazole (PROTONIX) 40 MG tablet Take 40 mg by mouth daily. 02/20/20  Yes [provider]  potassium chloride SA (KLOR-CON M) 20 MEQ tablet TAKE ONE TABLET BY MOUTH TWICE DAILY Patient taking differently: 20 mEq 2 (two) times daily. 06/20/22  Yes [name], Peter M, MD  sertraline (ZOLOFT) 100 MG tablet Take 1 tablet (100 mg total) by mouth daily. 11/16/22  Yes Cox, Kirsten, MD  vitamin A 8000 UNIT capsule Take 8,000 Units by mouth daily.   Yes [provider]  warfarin (COUMADIN) 2.5 MG tablet Take 1/2  tablet to 1 tablet daily by mouth or as directed by Anticoagulation Clinic. Patient taking differently: Takes 1/2 tablet on Monday and Friday and 1 tablet all other days 11/21/22  Yes [name], Peter M, MD  Zinc Picolinate POWD Take by mouth.   Yes [provider]  cephALEXin (KEFLEX) 500 MG capsule Take 1 capsule (500 mg total) by mouth 2 (two) times daily. Patient not taking: Reported on 01/01/2023 06/28/22   [name], MD  cyanocobalamin (,VITAMIN B-12,) 1000 MCG/ML injection Inject 1 mL (1,000 mcg total) into the muscle every 30 (thirty) days. Patient not taking: Reported on 01/01/2023 09/14/21   [name], MD  oxybutynin (DITROPAN) 5 MG tablet Take 1 tablet (5 mg total) by mouth 3 (three) times daily. Patient not taking: Reported on 01/01/2023 10/20/22   [name], MD  oxyCODONE-acetaminophen (PERCOCET) 10-325 MG tablet Take 1 tablet by mouth 2 (two) times daily as needed. Patient not taking: Reported on 01/01/2023 02/16/22   [provider]     Signature:  Chesley Mires, MD [place] Pager - [phone] or [phone] 01/05/2023, 2:08 PM

## 2023-01-05 NOTE — Progress Notes (Signed)
PT Cancellation Note  Patient Details Name: [name] MRN: [id number] DOB: Jan 16, 1932   Cancelled Treatment:    Reason Eval/Treat Not Completed: Other (comment). Pt politely declines PT, reports needing to have a BM and having surgery tomorrow. Will continue to follow.    Talbot Grumbling PT, DPT 01/05/23, 3:09 PM

## 2023-01-05 NOTE — Plan of Care (Signed)
?  Problem: Coping: ?Goal: Level of anxiety will decrease ?Outcome: Progressing ?  ?Problem: Safety: ?Goal: Ability to remain free from injury will improve ?Outcome: Progressing ?  ?

## 2023-01-05 NOTE — Progress Notes (Signed)
[facility] PROGRESS NOTE  [name] (DOB: [birth year]) [id number] PCP: [name], MD (Inactive)  Brief Narrative: [name] is an 89 y.o. female with past medical history significant for but #2 CAD, permanent atrial fibrillation on anticoagulation with Coumadin, mild to moderate MR, mild to moderate TR, hypertension, LVH, hyperlipidemia, pulmonary hypertension, arthritis, chronic back pain, depression, COPD and chronic bronchitis, PAD, GERD, prediabetes, urge incontinence, macular degeneration, B12 deficiency as well as other comorbidities who presents to the ED via EMS after a fall around the night before last.  She reported had been crawling around on the floor and complained of soreness in her legs and low back.  On arrival to the ED she is awake and alert and oriented x 3 and answers all questions appropriately.  She underwent further lab work and testing.  Head CT and C-spine were negative for any acute findings.  CT of the chest was negative for any acute trauma but did show a rounded density in the right bronchus intermedius with complete density bronchial filling of the right lower lobe bronchi and complete right lower lobe collapse.  The acuity was unclear but it failed to be chronic.  The recommendation is for bronchoscopy but given that she had a supra therapeutic INR we will hold off on pulmonary consultation until her INR starts drifting downward.   CT scan of the thoracic spine showed acute generalized compression fracture T12 with vertebral body 30% height loss and 3 mm posterior superior retropulsion.  There is also noted to have chronic compression fractures of T10 and T8 along with interval kyphoplasty.  She is also noted to have chronic compression fractures of T1, T2, T3 and T4 as well as chronic T12-L1 left paracentral calcified disc extrusion with cranial extension.   A CT scan of the lumbar spine was done and showed increased compression fracture  of L3 now with 50% of anterior and central vertebral body height loss and 30% loss of the posterior vertebral body height with posterior superior L3 retropulsion causing moderate spinal canal stenosis.  This is felt to be possibly acute on chronic injury but the radiologist suspects subacute on chronic.   She normally ambulates with a walker but has chronic issues due to her balance due to peripheral neuropathy.  She continues to live alone and has frequent falls and had another fall and she was in the kitchen when she fell and slipped on her buttocks.  She presented for this and was admitted for further workup and PT OT recommending SNF.  She is currently getting IV fluid hydration for rhabdomyolysis and will continue today and will consult pulmonary for her CT scan findings once her INR dropped down further and is more therapeutic.   Patient is finally agreeable to SNF.  Continues to get IV fluid hydration  Subjective: No complaints, does think she'll pursue bronchoscopy after discussing with her daughters. Reluctantly agreeable to rehabilitation at SNF.  Objective: BP 109/68 (BP Location: Left Arm)   Pulse 93   Temp 98.1 F (36.7 C)   Resp 17   Ht '5\' 4"'$  (1.626 m)   Wt 64.4 kg   SpO2 95%   BMI 24.37 kg/m   Gen: chronically ill-appearing female in no distress Pulm: Diminished R > L base, no distress  CV: RRR, no MRG or pitting edema GI: Soft, NT, ND, +BS  Neuro: Alert and oriented. No new focal deficits. Ext: Warm, no deformities, decreased muscle bulk Skin: No rashes, lesions or ulcers on visualized  skin   Assessment & Plan: Recurrent falls -Appear to be related to chronic issues with balance in the setting of peripheral neuropathy, uses a walker to ambulate.   -She had another fall the other night which appears to be mechanical based on history. Patient denies head injury or loss of consciousness. Denies preceding lightheadedness/dizziness, chest pain, shortness of breath.   - She  lives alone, cannot currently have 24/7 assistance. Would benefit from rehabilitation at SNF to which the patient consents at this time. Will start insurance authorization.    Permanent A-fib: Rate controlled - Continue diltiazem, holding coumadin as below.  Rhabdomyolysis: In the setting of recent fall.  CK 1993 and is now improving and 968 -> 650. Creatinine is at baseline.  - Supportive care now.    Mild hyperkalemia: Resolved    Supratherapeutic INR: No evidence of bleeding on trauma scans.  Hemoglobin is stable. - Holding coumadin. In absence of bleeding, not reversing INR at this time. Consider discontinuing anticoagulation vs. changing to DOAC if cost concerns can be ameliorated.   Elevated transaminases/Abnormal LFTs: Improving steadily. Steatosis on imaging.   Acute T12 vertebral compression fracture on multilevel chronic vertebral compression fractures:  - CT thoracic spine showing acute generalized compression fracture of the T12 vertebral body with 30% height loss and 3 mm posterosuperior retropulsion.  Also showing chronic compression fractures of T10 and T11 with interval kyphoplasty.  Chronic compression fractures of T1, T2, T3, and T4.  Chronic T12-L1 left paracentral calcified disc extrusion with cranial extension.  - CT of lumbar spine showing increased compression fracture of L3, now with 50% loss of anterior and central ventral body height and 30% loss of the posterior vertebral body height with posterosuperior L3 retropulsion causing moderate spinal canal stenosis.  Possible acute on chronic injury but radiologist suspects it is probably subacute or chronic.   - Regarding T12 compression fracture, the patient has not had any back pain or neurological deficit, so kyphoplasty is not currently indicated.  - Continue pain management as needed and outpatient follow-up with her spine surgeon, Dr. Rolena Infante. - PT/OT eval, fall precautions.   Right lower lobe collapse, obstructing  lesion in RLL bronchus: CT chest showing rounded density in the right bronchus intermedius with complete low-density bronchial filling of the right lower lobe bronchi and complete right lower lobe collapse.  Acuity is uncertain but favored to be chronic.   -There is a 2.9 cm fluid density structure within the area of atelectatic lung in the right lower lobe.  This is nonspecific. Both neoplastic or infectious etiologies are considered.   - Pulmonary consulted, planning bronchoscopy 3/1 pending INR < 2. D/w pt and daughter.    CAD: Troponin 18 > 17 and patient is not endorsing chest pain.     Hypertension: Blood pressure stable.   - Continue diltiazem 120 p.o. daily   Hyponatremia: Resolved.   Hyperlipidemia - Holding statin at this time in the setting of rhabdomyolysis/elevated liver enzymes. - Resume once improving further and close to normal   Depression - Continue with sertraline 100 mg p.o. daily   COPD/chronic bronchitis: Stable, no wheezing or shortness of breath. Unclear diagnostic work up PTA.  - Defer work up to pulmonary   GERD - Continue PPI with pantoprazole 40 g p.o. daily.   Prediabetes: HbA1c 6.4% on 10/04/2022. At inpatient goal.   Peripheral neuropathy - Continue gabapentin.   Hypoalbuminemia: Supplement protein.  Normocytic anemia: Iron level of 68, UIBC of 162, TIBC of 230,  saturation ratios of 30%, ferritin level of 262, folate level 4.1, vitamin B12 99991111 - Started folic acid supplement   [name], MD [facility] [email or web address] 01/05/2023, 2:38 PM

## 2023-01-06 ENCOUNTER — Inpatient Hospital Stay (HOSPITAL_COMMUNITY): Payer: Medicare Other | Admitting: Anesthesiology

## 2023-01-06 ENCOUNTER — Inpatient Hospital Stay (HOSPITAL_COMMUNITY): Payer: Medicare Other

## 2023-01-06 ENCOUNTER — Encounter (HOSPITAL_COMMUNITY)
Admission: EM | Disposition: A | Discharge status: Skilled Nursing Facility | Payer: Self-pay | Source: Home / Self Care | Attending: Family Medicine

## 2023-01-06 ENCOUNTER — Encounter (HOSPITAL_COMMUNITY): Payer: Self-pay | Admitting: Internal Medicine

## 2023-01-06 DIAGNOSIS — R296 Repeated falls: Secondary | ICD-10-CM | POA: Diagnosis not present

## 2023-01-06 DIAGNOSIS — I10 Essential (primary) hypertension: Secondary | ICD-10-CM

## 2023-01-06 DIAGNOSIS — R918 Other nonspecific abnormal finding of lung field: Secondary | ICD-10-CM

## 2023-01-06 DIAGNOSIS — J449 Chronic obstructive pulmonary disease, unspecified: Secondary | ICD-10-CM

## 2023-01-06 DIAGNOSIS — I2510 Atherosclerotic heart disease of native coronary artery without angina pectoris: Secondary | ICD-10-CM

## 2023-01-06 HISTORY — PX: BRONCHIAL WASHINGS: SHX5105

## 2023-01-06 HISTORY — PX: HEMOSTASIS CONTROL: SHX6838

## 2023-01-06 HISTORY — PX: BRONCHIAL BRUSHINGS: SHX5108

## 2023-01-06 HISTORY — PX: VIDEO BRONCHOSCOPY: SHX5072

## 2023-01-06 LAB — COMPREHENSIVE METABOLIC PANEL
ALT: 44 U/L (ref 0–44)
AST: 42 U/L — ABNORMAL HIGH (ref 15–41)
Albumin: 2.2 g/dL — ABNORMAL LOW (ref 3.5–5.0)
Alkaline Phosphatase: 69 U/L (ref 38–126)
Anion gap: 7 (ref 5–15)
BUN: 12 mg/dL (ref 8–23)
CO2: 23 mmol/L (ref 22–32)
Calcium: 9 mg/dL (ref 8.9–10.3)
Chloride: 107 mmol/L (ref 98–111)
Creatinine, Ser: 0.63 mg/dL (ref 0.44–1.00)
GFR, Estimated: >60 mL/min (ref >60)
Glucose, Bld: 90 mg/dL (ref 70–99)
Potassium: 3 mmol/L — ABNORMAL LOW (ref 3.5–5.1)
Sodium: 137 mmol/L (ref 135–145)
Total Bilirubin: 0.5 mg/dL (ref 0.3–1.2)
Total Protein: 5.9 g/dL — ABNORMAL LOW (ref 6.5–8.1)

## 2023-01-06 LAB — SARS CORONAVIRUS 2 BY RT PCR: SARS Coronavirus 2 by RT PCR: NEGATIVE

## 2023-01-06 LAB — PROTIME-INR
INR: 1.8 — ABNORMAL HIGH (ref 0.8–1.2)
Prothrombin Time: 20.8 seconds — ABNORMAL HIGH (ref 11.4–15.2)

## 2023-01-06 SURGERY — VIDEO BRONCHOSCOPY WITHOUT FLUORO
Anesthesia: General

## 2023-01-06 MED ORDER — ALBUTEROL SULFATE (2.5 MG/3ML) 0.083% IN NEBU
2.5000 mg | INHALATION_SOLUTION | Freq: Once | RESPIRATORY_TRACT | Status: AC
  Administered 2023-01-06: 2.5 mg via RESPIRATORY_TRACT

## 2023-01-06 MED ORDER — SODIUM CHLORIDE (PF) 0.9 % IJ SOLN
PREFILLED_SYRINGE | INTRAMUSCULAR | Status: DC | PRN
  Administered 2023-01-06: 3 mL

## 2023-01-06 MED ORDER — FENTANYL CITRATE (PF) 100 MCG/2ML IJ SOLN
INTRAMUSCULAR | Status: AC
  Filled 2023-01-06: qty 2

## 2023-01-06 MED ORDER — LIDOCAINE 2% (20 MG/ML) 5 ML SYRINGE
INTRAMUSCULAR | Status: DC | PRN
  Administered 2023-01-06: 100 mg via INTRAVENOUS

## 2023-01-06 MED ORDER — SUGAMMADEX SODIUM 200 MG/2ML IV SOLN
INTRAVENOUS | Status: DC | PRN
  Administered 2023-01-06: 200 mg via INTRAVENOUS

## 2023-01-06 MED ORDER — ALBUTEROL SULFATE (2.5 MG/3ML) 0.083% IN NEBU
INHALATION_SOLUTION | RESPIRATORY_TRACT | Status: AC
  Filled 2023-01-06: qty 3

## 2023-01-06 MED ORDER — LIDOCAINE HCL (PF) 4 % IJ SOLN: 5.0000 mL | Freq: Once | INTRAMUSCULAR | Status: DC

## 2023-01-06 MED ORDER — FENTANYL CITRATE (PF) 100 MCG/2ML IJ SOLN
INTRAMUSCULAR | Status: DC | PRN
  Administered 2023-01-06 (×2): 25 ug via INTRAVENOUS

## 2023-01-06 MED ORDER — LIDOCAINE HCL (PF) 4 % IJ SOLN
INTRAMUSCULAR | Status: AC
  Filled 2023-01-06: qty 5

## 2023-01-06 MED ORDER — EPINEPHRINE 1 MG/10ML IJ SOSY
PREFILLED_SYRINGE | INTRAMUSCULAR | Status: AC
  Filled 2023-01-06: qty 10

## 2023-01-06 MED ORDER — PROPOFOL 10 MG/ML IV BOLUS
INTRAVENOUS | Status: AC
  Filled 2023-01-06: qty 20

## 2023-01-06 MED ORDER — ROCURONIUM BROMIDE 10 MG/ML (PF) SYRINGE
PREFILLED_SYRINGE | INTRAVENOUS | Status: DC | PRN
  Administered 2023-01-06: 40 mg via INTRAVENOUS

## 2023-01-06 MED ORDER — LACTATED RINGERS IV SOLN
INTRAVENOUS | Status: AC | PRN
  Administered 2023-01-06: 1000 mL via INTRAVENOUS

## 2023-01-06 MED ORDER — PROPOFOL 10 MG/ML IV BOLUS
INTRAVENOUS | Status: DC | PRN
  Administered 2023-01-06: 80 mg via INTRAVENOUS

## 2023-01-06 MED ORDER — ONDANSETRON HCL 4 MG/2ML IJ SOLN
INTRAMUSCULAR | Status: DC | PRN
  Administered 2023-01-06: 4 mg via INTRAVENOUS

## 2023-01-06 MED ORDER — POTASSIUM CHLORIDE CRYS ER 20 MEQ PO TBCR
40.0000 meq | EXTENDED_RELEASE_TABLET | Freq: Two times a day (BID) | ORAL | Status: AC
  Administered 2023-01-06 (×2): 40 meq via ORAL
  Filled 2023-01-06 (×2): qty 2

## 2023-01-06 NOTE — Progress Notes (Signed)
[facility] PROGRESS NOTE  Ronneka Kelch (DOB: 1932/07/04) [id number] PCP: [name], MD (Inactive)  Brief Narrative: [name] is an 89 y.o. female with past medical history significant for but #2 CAD, permanent atrial fibrillation on anticoagulation with Coumadin, mild to moderate MR, mild to moderate TR, hypertension, LVH, hyperlipidemia, pulmonary hypertension, arthritis, chronic back pain, depression, COPD and chronic bronchitis, PAD, GERD, prediabetes, urge incontinence, macular degeneration, B12 deficiency as well as other comorbidities who presents to the ED via EMS after a fall around the night before last.  She reported had been crawling around on the floor and complained of soreness in her legs and low back.  On arrival to the ED she is awake and alert and oriented x 3 and answers all questions appropriately.  She underwent further lab work and testing.  Head CT and C-spine were negative for any acute findings.  CT of the chest was negative for any acute trauma but did show a rounded density in the right bronchus intermedius with complete density bronchial filling of the right lower lobe bronchi and complete right lower lobe collapse.  The acuity was unclear but it failed to be chronic.  The recommendation is for bronchoscopy but given that she had a supra therapeutic INR we will hold off on pulmonary consultation until her INR starts drifting downward.   CT scan of the thoracic spine showed acute generalized compression fracture T12 with vertebral body 30% height loss and 3 mm posterior superior retropulsion.  There is also noted to have chronic compression fractures of T10 and T8 along with interval kyphoplasty.  She is also noted to have chronic compression fractures of T1, T2, T3 and T4 as well as chronic T12-L1 left paracentral calcified disc extrusion with cranial extension.   A CT scan of the lumbar spine was done and showed increased compression fracture  of L3 now with 50% of anterior and central vertebral body height loss and 30% loss of the posterior vertebral body height with posterior superior L3 retropulsion causing moderate spinal canal stenosis.  This is felt to be possibly acute on chronic injury but the radiologist suspects subacute on chronic.   She normally ambulates with a walker but has chronic issues due to her balance due to peripheral neuropathy.  She continues to live alone and has frequent falls and had another fall and she was in the kitchen when she fell and slipped on her buttocks.  She presented for this and was admitted for further workup and PT OT recommending SNF.  She is currently getting IV fluid hydration for rhabdomyolysis and will continue today and will consult pulmonary for her CT scan findings once her INR dropped down further and is more therapeutic.   Patient is finally agreeable to SNF.  Continues to get IV fluid hydration  Subjective: No complaints, ready to get bronchoscopy. Breathing as she normally does. No new pain.   Objective: BP 104/81 (BP Location: Left Arm)   Pulse 81   Temp 97.7 F (36.5 C)   Resp 15   Ht '5\' 4"'$  (1.626 m)   Wt 64.4 kg   SpO2 96%   BMI 24.37 kg/m   Gen: Chronically ill-appearing female in no distress Pulm: Clear and nonlabored  CV: Irreg irreg with systolic murmur, no edema GI: Soft, NT, ND, +BS Neuro: Alert and oriented. No new focal deficits. Ext: Warm, no deformities Skin: No rashes, lesions or ulcers on visualized skin   Assessment & Plan: Recurrent falls -Appear to  be related to chronic issues with balance in the setting of peripheral neuropathy, uses a walker to ambulate.   -She had another fall the other night which appears to be mechanical based on history. Patient denies head injury or loss of consciousness. Denies preceding lightheadedness/dizziness, chest pain, shortness of breath.   - She lives alone, cannot currently have 24/7 assistance. Would benefit from  rehabilitation at SNF to which the patient consents at this time. Will start insurance authorization.    Permanent A-fib: Rate controlled - Continue diltiazem, holding coumadin as below (CHA2DS2-VASc score is 4).  Rhabdomyolysis: In the setting of recent fall.  CK 1993 and is now improving and 968 -> 650. Creatinine is at baseline.  - Supportive care now.    Mild hyperkalemia: Resolved    Supratherapeutic INR: No evidence of bleeding on trauma scans.  Hemoglobin is stable. - Holding coumadin. In absence of bleeding, not reversing INR at this time. Consider discontinuing anticoagulation vs. changing to DOAC if cost concerns can be ameliorated. Has tolerated eliquis in the past.  Elevated transaminases/Abnormal LFTs: Improving steadily. Steatosis on imaging.   Acute T12 vertebral compression fracture on multilevel chronic vertebral compression fractures:  - CT thoracic spine showing acute generalized compression fracture of the T12 vertebral body with 30% height loss and 3 mm posterosuperior retropulsion.  Also showing chronic compression fractures of T10 and T11 with interval kyphoplasty.  Chronic compression fractures of T1, T2, T3, and T4.  Chronic T12-L1 left paracentral calcified disc extrusion with cranial extension.  - CT of lumbar spine showing increased compression fracture of L3, now with 50% loss of anterior and central ventral body height and 30% loss of the posterior vertebral body height with posterosuperior L3 retropulsion causing moderate spinal canal stenosis.  Possible acute on chronic injury but radiologist suspects it is probably subacute or chronic.   - Regarding T12 compression fracture, the patient has not had any back pain or neurological deficit, so kyphoplasty is not currently indicated.  - Continue pain management as needed and outpatient follow-up with her spine surgeon, Dr. Rolena Infante. - PT/OT eval, fall precautions. Planning rehab at SNF.   Right lower lobe collapse,  obstructing lesion in RLL bronchus: CT chest showing rounded density in the right bronchus intermedius with complete low-density bronchial filling of the right lower lobe bronchi and complete right lower lobe collapse.  Acuity is uncertain but favored to be chronic.   -There is a 2.9 cm fluid density structure within the area of atelectatic lung in the right lower lobe.  This is nonspecific. Both neoplastic or infectious etiologies are considered. Bronchoscopy planned 3/1.    CAD: Troponin 18 > 17 and patient is not endorsing chest pain.     Hypertension: Blood pressure stable.   - Continue diltiazem 120 p.o. daily   Hyponatremia: Resolved.   Hyperlipidemia - Holding statin at this time in the setting of rhabdomyolysis/elevated liver enzymes. - Resume once improving further and close to normal   Depression - Continue with sertraline 100 mg p.o. daily   COPD/chronic bronchitis: Stable, no wheezing or shortness of breath. Unclear diagnostic work up PTA.  - Defer work up to pulmonary   GERD - Continue PPI with pantoprazole 40 g p.o. daily.   Prediabetes: HbA1c 6.4% on 10/04/2022. At inpatient goal.   Peripheral neuropathy - Continue gabapentin.   Hypoalbuminemia: Supplement protein.  Normocytic anemia: Iron level of 68, UIBC of 162, TIBC of 230, saturation ratios of 30%, ferritin level of 262, folate level  4.1, vitamin B12 99991111 - Started folic acid supplement   [name], MD [facility] [email or web address] 01/06/2023, 10:16 AM

## 2023-01-06 NOTE — Transfer of Care (Signed)
Immediate Anesthesia Transfer of Care Note  Patient: [name]  Procedure(s) Performed: VIDEO BRONCHOSCOPY WITHOUT FLUORO HEMOSTASIS CONTROL BRONCHIAL WASHINGS BRONCHIAL BRUSHINGS  Patient Location: PACU  Anesthesia Type:General  Level of Consciousness: awake, alert  and oriented  Airway & Oxygen Therapy: Patient Spontanous Breathing and Patient connected to face mask oxygen  Post-op Assessment: Report given to RN, Post -op Vital signs reviewed and stable and Patient moving all extremities X 4  Post vital signs: Reviewed and stable  Last Vitals:  Vitals Value Taken Time  BP 202/111   Temp    Pulse 107   Resp 14   SpO2 98     Last Pain:  Vitals:   01/06/23 1200  TempSrc:   PainSc: 0-No pain      Patients Stated Pain Goal: 3 ([id number])  Complications: No notable events documented.

## 2023-01-06 NOTE — Progress Notes (Signed)
NAME:  [name], MRN:  [id number], DOB:  [birth year], LOS: 4 ADMISSION DATE:  01/01/2023, CONSULTATION DATE:  01/04/2023 REFERRING MD:  Dr. Bonner Puna, [facility], CHIEF COMPLAINT:  Abnormal CT chest   History of Present Illness:  89 yo female brought to [facility] ER after falling at home.  She was admitted by hospitalists with mild rhabdomyolysis, hyperkalemia, and acute T12 vertebral compression fracture.  CT chest imaging showed obstructing lesion in Rt bronchus intermedius and Rt lower lobe bronchus with atelectasis of Rt lower lobe.  Only prior imaging studies in our system for comparison from 2011.  PCCM consulted to assess whether bronchoscopy is needed.  Pertinent  Medical History  Arthritis, Back pain, CAD, Depression, HLD, HTN, HFpEF, Permanent A fib on coumadin, Pre-DM, GERD, Neuropathy, Osteopenia, Mitral regurgitation  Studies:   CT chest 01/01/23 >> atherosclerosis, mod HH, density in Rt bronchus intermedius with completed low density bronchial filling in RLLL bronchi and complete RLL collapse with 2.9 cm fluid density in area of atelectasis, subpleural reticulation and GGO in periphery with basilar predominance  Interim History / Subjective:  Doing ok.  Discussed resending DNR for procedure.  She is hesitant.  She states to do what I think is best.  I said we would rescind her DNR for the procedure.  She expressed understanding.  Discussed with daughter via phone, explained situation, she expresses understanding.  Objective   BP (!) 150/87   Pulse 81   Temp 97.7 F (36.5 C)   Resp 20   Ht '5\' 4"'$  (1.626 m)   Wt 64.4 kg   SpO2 98%   BMI 24.37 kg/m    Intake/Output Summary (Last 24 hours) at 01/06/2023 1250 Last data filed at 01/06/2023 0500 Gross per 24 hour  Intake 240 ml  Output 550 ml  Net -310 ml    Filed Weights   01/01/23 2235  Weight: 64.4 kg    Examination:  General - alert Eyes - pupils reactive ENT - no sinus tenderness, no stridor, poor hearing acuity Cardiac  - regular rate/rhythm, 2/6 SM Chest - decreased BS Rt base Abdomen - soft, non tender, + bowel sounds Extremities - no cyanosis, clubbing, or edema Skin - no rashes Neuro - normal strength, moves extremities, follows commands Psych - normal mood and behavior  Assessment & Plan:   Rt lower lobe collapse with obstructing lesion in Rt lower lobe bronchus. - has persistent lung collapse on CXR from 2/29 - d/w pt and her daughter, [name], regarding bronchoscopy day of procedure - risks of procedure detailed as bleeding, infection, pneumothorax, death and non diagnosis - they are agreeable to proceed with bronchoscopy -INR 1.8, okay to proceed with bronchoscopy, would not pursue biopsies given elevated INR, will consent for brushings and BAL - she has been labeled as having COPD in chart >> no history of tobacco abuse and no PFTs in chart; uncertain how this diagnosis was made  Goals of care. - DNR/DNI, rescinded for bronchoscopy, will reinstate post procedure  Labs   CBC: Recent Labs  Lab 01/01/23 1505 01/02/23 0318 01/03/23 0752 01/04/23 0549  WBC 9.7 9.8 7.0 8.1  NEUTROABS 8.4*  --  5.2 6.3  HGB 13.6 11.1* 10.8* 9.8*  HCT 44.8 36.9 35.4* 32.7*  MCV 85.5 85.2 85.3 86.1  PLT 378 331 296 290     Basic Metabolic Panel: Recent Labs  Lab 01/01/23 1505 01/02/23 0318 01/03/23 0752 01/04/23 0549 01/06/23 0421  NA 135 131* 137 137 137  K 5.3* 4.5  3.7 3.3* 3.0*  CL 103 102 108 107 107  CO2 '25 22 23 23 23  '$ GLUCOSE 110* 92 89 99 90  BUN 26* '21 20 13 12  '$ CREATININE 1.04* 0.83 0.74 0.75 0.63  CALCIUM 9.9 9.4 9.0 8.9 9.0  MG  --   --  1.8 1.8  --   PHOS  --   --  3.4 2.9  --     GFR: Estimated Creatinine Clearance: 42 mL/min (by C-G formula based on SCr of 0.63 mg/dL). Recent Labs  Lab 01/01/23 1505 01/02/23 0318 01/03/23 0752 01/04/23 0549  WBC 9.7 9.8 7.0 8.1     Liver Function Tests: Recent Labs  Lab 01/01/23 1505 01/02/23 0318 01/03/23 0752 01/04/23 0549  01/06/23 0421  AST 121* 82* 60* 58* 42*  ALT 73* 59* 51* 49* 44  ALKPHOS 100 78 71 66 69  BILITOT 0.7 0.7 0.8 0.7 0.5  PROT 8.2* 6.7 6.0* 5.7* 5.9*  ALBUMIN 3.1* 2.7* 2.5* 2.5* 2.2*    No results for input(s): "LIPASE", "AMYLASE" in the last 168 hours. No results for input(s): "AMMONIA" in the last 168 hours.  ABG    Component Value Date/Time   PHART 7.424 (H) 02/03/2009 0858   PCO2ART 37.3 02/03/2009 0858   PO2ART 60.0 (L) 02/03/2009 0858   HCO3 24.4 (H) 02/03/2009 0858   TCO2 31 03/01/2013 1100   ACIDBASEDEF 1.0 02/03/2009 0844   O2SAT 91.0 02/03/2009 0858     Coagulation Profile: Recent Labs  Lab 01/02/23 0318 01/03/23 0752 01/04/23 0549 01/05/23 0324 01/06/23 0421  INR 5.7* 3.2* 2.6* 2.2* 1.8*     Cardiac Enzymes: Recent Labs  Lab 01/01/23 1505 01/02/23 0318 01/03/23 0752  CKTOTAL 1,993* 968* 650*     HbA1C: Hgb A1c MFr Bld  Date/Time Value Ref Range Status  10/04/2022 11:40 AM 6.4 (H) 4.8 - 5.6 % Final    Comment:             Prediabetes: 5.7 - 6.4          Diabetes: >6.4          Glycemic control for adults with diabetes: <7.0   06/28/2022 11:34 AM 6.2 (H) 4.8 - 5.6 % Final    Comment:             Prediabetes: 5.7 - 6.4          Diabetes: >6.4          Glycemic control for adults with diabetes: <7.0     CBG: No results for input(s): "GLUCAP" in the last 168 hours.  Review of Systems:   Reviewed and negative  Past Medical History:  She,  has a past medical history of Arthritis, Back pain, chronic, Coronary artery disease (CARDIOLOGIST- DR [name]--  LOV IN EPIC), Depression, Hammer toe, Hypercholesterolemia, Hypertensive heart disease, LVH (left ventricular hypertrophy) due to hypertensive disease, Mitral insufficiency, Mitral regurgitation, Permanent atrial fibrillation ([place]), Pulmonary hypertension ([place]), Recurrent falls, and Transfusion history.   Surgical History:   Past Surgical History:  Procedure Laterality Date   ABDOMINAL  AORTOGRAM W/LOWER EXTREMITY N/A 04/29/2020   Procedure: ABDOMINAL AORTOGRAM W/LOWER EXTREMITY;  Surgeon: [name], MD;  Location: [place] [facility];  Service: Cardiovascular;  Laterality: N/A;  Lt Leg   ABDOMINAL HYSTERECTOMY     ANTERIOR REPAIR/ [place] PUBOVAGINAL SLING  05-03-2002   BUNIONECTOMY WITH HAMMERTOE RECONSTRUCTION Right 03/01/2013   Procedure: RIGHT FOOT EXCISION BUNIONETTE AND FUSION OF DIP FOURTH TOE;  Surgeon: Laurice Record  Aplington, MD;  Location: [place];  Service: Orthopedics;  Laterality: Right;   CARDIAC CATHETERIZATION  02-03-2009  DR  [name]   SINGLE-VESSEL OBSTRUCTIVE CAD, SMALL DIAGONAL BRANCH/ NORMAL LVF/ SEVERE MITRAL INSUFFICIENCY/ SEVERE PULMONARY HYPERTENSION   CATARACT EXTRACTION W/ INTRAOCULAR LENS  IMPLANT, BILATERAL  2003   COLONOSCOPY  07/25/2014   few mall scattered diverticula in the ectire examined colon. Small internal hemmorhoids.    HAMMER TOE SURGERY  02/28/2012   Procedure: HAMMER TOE CORRECTION;  Surgeon: [name], MD;  Location[facility];  Service: Orthopedics;  Laterality: Right;  FUSION OF PROXIMAL INTERPHALANGEAL  RIGHT THIRD CLAW TOE   PARS PLANA VITRECTOMY W/ REPAIR OF MACULAR HOLE  02-04-2002   LEFT EYE   PERIPHERAL VASCULAR BALLOON ANGIOPLASTY  04/29/2020   Procedure: PERIPHERAL VASCULAR BALLOON ANGIOPLASTY;  Surgeon: [name], MD;  Location: [place] [facility];  Service: Cardiovascular;;  Lt. AT   REPAIR RIGHT SECOND CLAW TOE/ VARUS MEDIAL CLOSING WEDGE OSTEOTOMY PROXIMAL PHALANX RIGHT GREAT TOE  05-05-2005   TOTAL HIP ARTHROPLASTY     BILATERAL---  LEFT 4/98;   RIGHT  11/98   VARUS OSTEOTOMY PROXIMAL PHALANX, LEFT GREAT TOE/ PARING DOWN THE CALLUS , SECOND LEFT TOE  10-27-2008     Social History:   reports that she has never smoked. She has never used smokeless tobacco. She reports that she does not currently use alcohol after a past usage of about 1.0 standard drink of alcohol per  week. She reports current drug use. Drug: Hydrocodone.   Family History:  Her family history includes Heart disease (age of onset: 92) in her mother; Prostate cancer (age of onset: 15) in her father.   Allergies Allergies  Allergen Reactions   Sulfamethoxazole     Other reaction(s): Other (See Comments) Not listed   Sulfanilamide     unkn Other reaction(s): Other (See Comments) unkn   Sulfur Dioxide     Other reaction(s): Other (See Comments)   Penicillins Rash     Home Medications  Prior to Admission medications   Medication Sig Start Date End Date Taking? Authorizing Provider  ascorbic acid (VITAMIN C) 500 MG tablet Take 600 mg by mouth daily.    Yes [provider]  atorvastatin (LIPITOR) 80 MG tablet Take 1 tablet (80 mg total) by mouth daily. 06/01/22 01/01/23 Yes [name], Peter M, MD  Biotin 1000 MCG tablet Take 1 tablet by mouth daily.    Yes [provider]  Calcium Carbonate-Vitamin D (CALCIUM + D PO) Take by mouth daily.   Yes [provider]  Cholecalciferol (VITAMIN D PO) Take by mouth daily.   Yes [provider]  diltiazem (CARDIZEM CD) 120 MG 24 hr capsule Take 120 mg by mouth daily. 02/24/22  Yes [provider]  diltiazem (TIAZAC) 120 MG 24 hr capsule TAKE ONE CAPSULE BY MOUTH DAILY 01/03/23   [name], Peter M, MD  gabapentin (NEURONTIN) 300 MG capsule Take 300 mg by mouth 2 (two) times daily. 03/16/22  Yes [provider]  lisinopril (ZESTRIL) 20 MG tablet Take 1 tablet (20 mg total) by mouth daily. 06/22/22  Yes [name], MD  pantoprazole (PROTONIX) 40 MG tablet Take 40 mg by mouth daily. 02/20/20  Yes [provider]  potassium chloride SA (KLOR-CON M) 20 MEQ tablet TAKE ONE TABLET BY MOUTH TWICE DAILY Patient taking differently: 20 mEq 2 (two) times daily. 06/20/22  Yes [name], Peter M, MD  sertraline (  ZOLOFT) 100 MG tablet Take 1 tablet (100 mg total) by mouth daily. 11/16/22  Yes Cox, Kirsten,  MD  vitamin A 8000 UNIT capsule Take 8,000 Units by mouth daily.   Yes [provider]  warfarin (COUMADIN) 2.5 MG tablet Take 1/2 tablet to 1 tablet daily by mouth or as directed by Anticoagulation Clinic. Patient taking differently: Takes 1/2 tablet on Monday and Friday and 1 tablet all other days 11/21/22  Yes [name], Peter M, MD  Zinc Picolinate POWD Take by mouth.   Yes [provider]  cephALEXin (KEFLEX) 500 MG capsule Take 1 capsule (500 mg total) by mouth 2 (two) times daily. Patient not taking: Reported on 01/01/2023 06/28/22   [name], MD  cyanocobalamin (,VITAMIN B-12,) 1000 MCG/ML injection Inject 1 mL (1,000 mcg total) into the muscle every 30 (thirty) days. Patient not taking: Reported on 01/01/2023 09/14/21   [name], MD  oxybutynin (DITROPAN) 5 MG tablet Take 1 tablet (5 mg total) by mouth 3 (three) times daily. Patient not taking: Reported on 01/01/2023 10/20/22   [name], MD  oxyCODONE-acetaminophen (PERCOCET) 10-325 MG tablet Take 1 tablet by mouth 2 (two) times daily as needed. Patient not taking: Reported on 01/01/2023 02/16/22   [provider]     Signature:  Lanier Clam, MD [place] for contact info 01/06/2023, 12:50 PM

## 2023-01-06 NOTE — Op Note (Signed)
See Provation for details

## 2023-01-06 NOTE — Plan of Care (Signed)
Reviewed and discussed the plan of care.

## 2023-01-06 NOTE — Anesthesia Procedure Notes (Signed)
Procedure Name: Intubation Date/Time: 01/06/2023 1:11 PM  Performed by: Niel Hummer, CRNAPre-anesthesia Checklist: Patient identified, Emergency Drugs available, Suction available and Patient being monitored Patient Re-evaluated:Patient Re-evaluated prior to induction Oxygen Delivery Method: Circle system utilized Preoxygenation: Pre-oxygenation with 100% oxygen Induction Type: IV induction Ventilation: Mask ventilation without difficulty Laryngoscope Size: Mac and 4 Grade View: Grade I Tube type: Oral Tube size: 8.5 mm Number of attempts: 1 Airway Equipment and Method: Stylet Placement Confirmation: ETT inserted through vocal cords under direct vision, positive ETCO2 and breath sounds checked- equal and bilateral Secured at: 22 cm Tube secured with: Tape Dental Injury: Teeth and Oropharynx as per pre-operative assessment

## 2023-01-06 NOTE — Brief Op Note (Signed)
01/01/2023 - 01/06/2023  2:21 PM  PATIENT:  [name]  89 y.o. female  PRE-OPERATIVE DIAGNOSIS:  lung mass  POST-OPERATIVE DIAGNOSIS:  Right Bronchus Intermedius BAL; Right Bronchus Intermedius Brushing  PROCEDURE:  Procedure(s): VIDEO BRONCHOSCOPY WITHOUT FLUORO (N/A) HEMOSTASIS CONTROL BRONCHIAL WASHINGS BRONCHIAL BRUSHINGS  SURGEON:  Surgeon(s) and Role:    * Gennie Dib, Bonna Gains, MD - Primary  PHYSICIAN ASSISTANT:   ASSISTANTS: none   ANESTHESIA:   general  EBL:  3 ml  BLOOD ADMINISTERED:none    SPECIMEN:  Source of Specimen:  brushing bronchus intermedius, washings bronchus intermedius  DISPOSITION OF SPECIMEN:  PATHOLOGY   PATIENT DISPOSITION:  PACU - hemodynamically stable.   Delay start of Pharmacological VTE agent (>24hrs) due to surgical blood loss or risk of bleeding: yes

## 2023-01-06 NOTE — H&P (View-Only) (Signed)
NAME:  [name], MRN:  [id number], DOB:  09/28/1932, LOS: 4 ADMISSION DATE:  01/01/2023, CONSULTATION DATE:  01/04/2023 REFERRING MD:  Dr. Bonner Puna, [facility], CHIEF COMPLAINT:  Abnormal CT chest   History of Present Illness:  89 yo female brought to [facility] ER after falling at home.  She was admitted by hospitalists with mild rhabdomyolysis, hyperkalemia, and acute T12 vertebral compression fracture.  CT chest imaging showed obstructing lesion in Rt bronchus intermedius and Rt lower lobe bronchus with atelectasis of Rt lower lobe.  Only prior imaging studies in our system for comparison from 2011.  PCCM consulted to assess whether bronchoscopy is needed.  Pertinent  Medical History  Arthritis, Back pain, CAD, Depression, HLD, HTN, HFpEF, Permanent A fib on coumadin, Pre-DM, GERD, Neuropathy, Osteopenia, Mitral regurgitation  Studies:   CT chest 01/01/23 >> atherosclerosis, mod HH, density in Rt bronchus intermedius with completed low density bronchial filling in RLLL bronchi and complete RLL collapse with 2.9 cm fluid density in area of atelectasis, subpleural reticulation and GGO in periphery with basilar predominance  Interim History / Subjective:  Doing ok.  Discussed resending DNR for procedure.  She is hesitant.  She states to do what I think is best.  I said we would rescind her DNR for the procedure.  She expressed understanding.  Discussed with daughter via phone, explained situation, she expresses understanding.  Objective   BP (!) 150/87   Pulse 81   Temp 97.7 F (36.5 C)   Resp 20   Ht '5\' 4"'$  (1.626 m)   Wt 64.4 kg   SpO2 98%   BMI 24.37 kg/m    Intake/Output Summary (Last 24 hours) at 01/06/2023 1250 Last data filed at 01/06/2023 0500 Gross per 24 hour  Intake 240 ml  Output 550 ml  Net -310 ml    Filed Weights   01/01/23 2235  Weight: 64.4 kg    Examination:  General - alert Eyes - pupils reactive ENT - no sinus tenderness, no stridor, poor hearing acuity Cardiac  - regular rate/rhythm, 2/6 SM Chest - decreased BS Rt base Abdomen - soft, non tender, + bowel sounds Extremities - no cyanosis, clubbing, or edema Skin - no rashes Neuro - normal strength, moves extremities, follows commands Psych - normal mood and behavior  Assessment & Plan:   Rt lower lobe collapse with obstructing lesion in Rt lower lobe bronchus. - has persistent lung collapse on CXR from 2/29 - d/w pt and her daughter, [name], regarding bronchoscopy day of procedure - risks of procedure detailed as bleeding, infection, pneumothorax, death and non diagnosis - they are agreeable to proceed with bronchoscopy -INR 1.8, okay to proceed with bronchoscopy, would not pursue biopsies given elevated INR, will consent for brushings and BAL - she has been labeled as having COPD in chart >> no history of tobacco abuse and no PFTs in chart; uncertain how this diagnosis was made  Goals of care. - DNR/DNI, rescinded for bronchoscopy, will reinstate post procedure  Labs   CBC: Recent Labs  Lab 01/01/23 1505 01/02/23 0318 01/03/23 0752 01/04/23 0549  WBC 9.7 9.8 7.0 8.1  NEUTROABS 8.4*  --  5.2 6.3  HGB 13.6 11.1* 10.8* 9.8*  HCT 44.8 36.9 35.4* 32.7*  MCV 85.5 85.2 85.3 86.1  PLT 378 331 296 290     Basic Metabolic Panel: Recent Labs  Lab 01/01/23 1505 01/02/23 0318 01/03/23 0752 01/04/23 0549 01/06/23 0421  NA 135 131* 137 137 137  K 5.3* 4.5  3.7 3.3* 3.0*  CL 103 102 108 107 107  CO2 '25 22 23 23 23  '$ GLUCOSE 110* 92 89 99 90  BUN 26* '21 20 13 12  '$ CREATININE 1.04* 0.83 0.74 0.75 0.63  CALCIUM 9.9 9.4 9.0 8.9 9.0  MG  --   --  1.8 1.8  --   PHOS  --   --  3.4 2.9  --     GFR: Estimated Creatinine Clearance: 42 mL/min (by C-G formula based on SCr of 0.63 mg/dL). Recent Labs  Lab 01/01/23 1505 01/02/23 0318 01/03/23 0752 01/04/23 0549  WBC 9.7 9.8 7.0 8.1     Liver Function Tests: Recent Labs  Lab 01/01/23 1505 01/02/23 0318 01/03/23 0752 01/04/23 0549  01/06/23 0421  AST 121* 82* 60* 58* 42*  ALT 73* 59* 51* 49* 44  ALKPHOS 100 78 71 66 69  BILITOT 0.7 0.7 0.8 0.7 0.5  PROT 8.2* 6.7 6.0* 5.7* 5.9*  ALBUMIN 3.1* 2.7* 2.5* 2.5* 2.2*    No results for input(s): "LIPASE", "AMYLASE" in the last 168 hours. No results for input(s): "AMMONIA" in the last 168 hours.  ABG    Component Value Date/Time   PHART 7.424 (H) 02/03/2009 0858   PCO2ART 37.3 02/03/2009 0858   PO2ART 60.0 (L) 02/03/2009 0858   HCO3 24.4 (H) 02/03/2009 0858   TCO2 31 03/01/2013 1100   ACIDBASEDEF 1.0 02/03/2009 0844   O2SAT 91.0 02/03/2009 0858     Coagulation Profile: Recent Labs  Lab 01/02/23 0318 01/03/23 0752 01/04/23 0549 01/05/23 0324 01/06/23 0421  INR 5.7* 3.2* 2.6* 2.2* 1.8*     Cardiac Enzymes: Recent Labs  Lab 01/01/23 1505 01/02/23 0318 01/03/23 0752  CKTOTAL 1,993* 968* 650*     HbA1C: Hgb A1c MFr Bld  Date/Time Value Ref Range Status  10/04/2022 11:40 AM 6.4 (H) 4.8 - 5.6 % Final    Comment:             Prediabetes: 5.7 - 6.4          Diabetes: >6.4          Glycemic control for adults with diabetes: <7.0   06/28/2022 11:34 AM 6.2 (H) 4.8 - 5.6 % Final    Comment:             Prediabetes: 5.7 - 6.4          Diabetes: >6.4          Glycemic control for adults with diabetes: <7.0     CBG: No results for input(s): "GLUCAP" in the last 168 hours.  Review of Systems:   Reviewed and negative  Past Medical History:  She,  has a past medical history of Arthritis, Back pain, chronic, Coronary artery disease (CARDIOLOGIST- DR [name]--  LOV IN EPIC), Depression, Hammer toe, Hypercholesterolemia, Hypertensive heart disease, LVH (left ventricular hypertrophy) due to hypertensive disease, Mitral insufficiency, Mitral regurgitation, Permanent atrial fibrillation ([place]), Pulmonary hypertension ([place]), Recurrent falls, and Transfusion history.   Surgical History:   Past Surgical History:  Procedure Laterality Date   ABDOMINAL  AORTOGRAM W/LOWER EXTREMITY N/A 04/29/2020   Procedure: ABDOMINAL AORTOGRAM W/LOWER EXTREMITY;  Surgeon: [name], MD;  Location: [place] [facility];  Service: Cardiovascular;  Laterality: N/A;  Lt Leg   ABDOMINAL HYSTERECTOMY     ANTERIOR REPAIR/ [place] PUBOVAGINAL SLING  05-03-2002   BUNIONECTOMY WITH HAMMERTOE RECONSTRUCTION Right 03/01/2013   Procedure: RIGHT FOOT EXCISION BUNIONETTE AND FUSION OF DIP FOURTH TOE;  Surgeon: Laurice Record  Aplington, MD;  Location: [place];  Service: Orthopedics;  Laterality: Right;   CARDIAC CATHETERIZATION  02-03-2009  DR  [name]   SINGLE-VESSEL OBSTRUCTIVE CAD, SMALL DIAGONAL BRANCH/ NORMAL LVF/ SEVERE MITRAL INSUFFICIENCY/ SEVERE PULMONARY HYPERTENSION   CATARACT EXTRACTION W/ INTRAOCULAR LENS  IMPLANT, BILATERAL  2003   COLONOSCOPY  07/25/2014   few mall scattered diverticula in the ectire examined colon. Small internal hemmorhoids.    HAMMER TOE SURGERY  02/28/2012   Procedure: HAMMER TOE CORRECTION;  Surgeon: [name], MD;  Location: [facility];  Service: Orthopedics;  Laterality: Right;  FUSION OF PROXIMAL INTERPHALANGEAL  RIGHT THIRD CLAW TOE   PARS PLANA VITRECTOMY W/ REPAIR OF MACULAR HOLE  02-04-2002   LEFT EYE   PERIPHERAL VASCULAR BALLOON ANGIOPLASTY  04/29/2020   Procedure: PERIPHERAL VASCULAR BALLOON ANGIOPLASTY;  Surgeon: [name], MD;  Location: [place] [facility];  Service: Cardiovascular;;  Lt. AT   REPAIR RIGHT SECOND CLAW TOE/ VARUS MEDIAL CLOSING WEDGE OSTEOTOMY PROXIMAL PHALANX RIGHT GREAT TOE  05-05-2005   TOTAL HIP ARTHROPLASTY     BILATERAL---  LEFT 4/98;   RIGHT  11/98   VARUS OSTEOTOMY PROXIMAL PHALANX, LEFT GREAT TOE/ PARING DOWN THE CALLUS , SECOND LEFT TOE  10-27-2008     Social History:   reports that she has never smoked. She has never used smokeless tobacco. She reports that she does not currently use alcohol after a past usage of about 1.0 standard drink of alcohol per  week. She reports current drug use. Drug: Hydrocodone.   Family History:  Her family history includes Heart disease (age of onset: 53) in her mother; Prostate cancer (age of onset: 58) in her father.   Allergies Allergies  Allergen Reactions   Sulfamethoxazole     Other reaction(s): Other (See Comments) Not listed   Sulfanilamide     unkn Other reaction(s): Other (See Comments) unkn   Sulfur Dioxide     Other reaction(s): Other (See Comments)   Penicillins Rash     Home Medications  Prior to Admission medications   Medication Sig Start Date End Date Taking? Authorizing Provider  ascorbic acid (VITAMIN C) 500 MG tablet Take 600 mg by mouth daily.    Yes [provider]  atorvastatin (LIPITOR) 80 MG tablet Take 1 tablet (80 mg total) by mouth daily. 06/01/22 01/01/23 Yes [name], Peter M, MD  Biotin 1000 MCG tablet Take 1 tablet by mouth daily.    Yes [provider]  Calcium Carbonate-Vitamin D (CALCIUM + D PO) Take by mouth daily.   Yes [provider]  Cholecalciferol (VITAMIN D PO) Take by mouth daily.   Yes [provider]  diltiazem (CARDIZEM CD) 120 MG 24 hr capsule Take 120 mg by mouth daily. 02/24/22  Yes [provider]  diltiazem (TIAZAC) 120 MG 24 hr capsule TAKE ONE CAPSULE BY MOUTH DAILY 01/03/23   [name], Peter M, MD  gabapentin (NEURONTIN) 300 MG capsule Take 300 mg by mouth 2 (two) times daily. 03/16/22  Yes [provider]  lisinopril (ZESTRIL) 20 MG tablet Take 1 tablet (20 mg total) by mouth daily. 06/22/22  Yes [name], MD  pantoprazole (PROTONIX) 40 MG tablet Take 40 mg by mouth daily. 02/20/20  Yes [provider]  potassium chloride SA (KLOR-CON M) 20 MEQ tablet TAKE ONE TABLET BY MOUTH TWICE DAILY Patient taking differently: 20 mEq 2 (two) times daily. 06/20/22  Yes [name], Peter M, MD  sertraline (  ZOLOFT) 100 MG tablet Take 1 tablet (100 mg total) by mouth daily. 11/16/22  Yes Cox, Kirsten,  MD  vitamin A 8000 UNIT capsule Take 8,000 Units by mouth daily.   Yes [provider]  warfarin (COUMADIN) 2.5 MG tablet Take 1/2 tablet to 1 tablet daily by mouth or as directed by Anticoagulation Clinic. Patient taking differently: Takes 1/2 tablet on Monday and Friday and 1 tablet all other days 11/21/22  Yes [name], Peter M, MD  Zinc Picolinate POWD Take by mouth.   Yes [provider]  cephALEXin (KEFLEX) 500 MG capsule Take 1 capsule (500 mg total) by mouth 2 (two) times daily. Patient not taking: Reported on 01/01/2023 06/28/22   [name], MD  cyanocobalamin (,VITAMIN B-12,) 1000 MCG/ML injection Inject 1 mL (1,000 mcg total) into the muscle every 30 (thirty) days. Patient not taking: Reported on 01/01/2023 09/14/21   [name], MD  oxybutynin (DITROPAN) 5 MG tablet Take 1 tablet (5 mg total) by mouth 3 (three) times daily. Patient not taking: Reported on 01/01/2023 10/20/22   [name], MD  oxyCODONE-acetaminophen (PERCOCET) 10-325 MG tablet Take 1 tablet by mouth 2 (two) times daily as needed. Patient not taking: Reported on 01/01/2023 02/16/22   [provider]     Signature:  Lanier Clam, MD [place] for contact info 01/06/2023, 12:50 PM

## 2023-01-06 NOTE — Progress Notes (Signed)
PT Cancellation Note  Patient Details Name: [name] MRN: [id number] DOB: [birth year]   Cancelled Treatment:      Pt declined to participate with therapy today. Pt indicated she was fatigued and had a procedure coming up.  Pt is scheduled for bronchoscope at 1300. PT will return if time allows. Therapy to continue to follow pt acutely.   Baird Lyons, PT  Adair Patter 01/06/2023, 11:25 AM

## 2023-01-06 NOTE — Anesthesia Postprocedure Evaluation (Signed)
Anesthesia Post Note  Patient: [name]  Procedure(s) Performed: VIDEO BRONCHOSCOPY WITHOUT FLUORO HEMOSTASIS CONTROL BRONCHIAL WASHINGS BRONCHIAL BRUSHINGS     Patient location during evaluation: PACU Anesthesia Type: General Level of consciousness: sedated Pain management: pain level controlled Vital Signs Assessment: post-procedure vital signs reviewed and stable Respiratory status: spontaneous breathing and respiratory function stable Cardiovascular status: stable Postop Assessment: no apparent nausea or vomiting Anesthetic complications: no   No notable events documented.  Last Vitals:  Vitals:   01/06/23 1345 01/06/23 1355  BP: 132/65 (!) 143/69  Pulse: (!) 114 (!) 109  Resp: 16 17  Temp:    SpO2: 95% 92%    Last Pain:  Vitals:   01/06/23 1355  TempSrc:   PainSc: 0-No pain                 [name] DANIEL

## 2023-01-06 NOTE — Interval H&P Note (Signed)
History and Physical Interval Note:  01/06/2023 12:53 PM  [name]  has presented today for surgery, with the diagnosis of lung mass.  The various methods of treatment have been discussed with the patient and family. After consideration of risks, benefits and other options for treatment, the patient has consented to  Procedure(s): VIDEO BRONCHOSCOPY WITHOUT FLUORO (N/A) as a surgical intervention.  The patient's history has been reviewed, patient examined, no change in status, stable for surgery.  I have reviewed the patient's chart and labs.  Questions were answered to the patient's satisfaction.     Bonna Gains Delbert Darley

## 2023-01-06 NOTE — TOC Progression Note (Signed)
Transition of Care [facility]) - Progression Note    Patient Details  Name: [name] MRN: [id number] Date of Birth: 1931/11/12  Transition of Care [facility]) CM/SW Contact  Lennart Pall, LCSW Phone Number: 01/06/2023, 3:28 PM  Clinical Narrative:    MD notes pt may be medically ready for dc to SNF tomorrow/ weekend.  Have confirmed with [facility] @ [phone]) that they can do a weekend admission.  We have insurance authorization already.  Have asked weekend [facility] staff to follow up and coordinate [facility] transport if cleared.  Have alerted pt's daughter, [name], as well.   Expected Discharge Plan: [place] (vs. SNF) Barriers to Discharge: Continued Medical Work up  Expected Discharge Plan and Services In-house Referral: Clinical Social Work     Living arrangements for the past 2 months: Apartment                                       Social Determinants of Health (SDOH) Interventions SDOH Screenings   Food Insecurity: No Food Insecurity (01/02/2023)  Housing: Low Risk  (01/02/2023)  Recent Concern: Housing - Medium Risk (12/28/2022)  Transportation Needs: No Transportation Needs (01/02/2023)  Utilities: Not At Risk (01/02/2023)  Alcohol Screen: Low Risk  (03/02/2022)  Depression (PHQ2-9): Low Risk  (03/02/2022)  Financial Resource Strain: High Risk (12/28/2022)  Physical Activity: Inactive (05/17/2022)  Stress: Stress Concern Present (03/02/2022)  Tobacco Use: Low Risk  (01/06/2023)    Readmission Risk Interventions     No data to display

## 2023-01-06 NOTE — Anesthesia Preprocedure Evaluation (Addendum)
Anesthesia Evaluation  Patient identified by MRN, date of birth, ID band Patient awake    Reviewed: Allergy & Precautions, NPO status , Patient's Chart, lab work & pertinent test results  History of Anesthesia Complications Negative for: history of anesthetic complications  Airway Mallampati: I  TM Distance: >3 FB Neck ROM: Full    Dental  (+) Edentulous Upper, Dental Advisory Given   Pulmonary COPD    + wheezing      Cardiovascular hypertension, + CAD and + Peripheral Vascular Disease  + dysrhythmias Atrial Fibrillation + Valvular Problems/Murmurs MR  Rhythm:Regular Rate:Normal + Systolic murmurs    Neuro/Psych  PSYCHIATRIC DISORDERS  Depression    negative neurological ROS     GI/Hepatic negative GI ROS, Neg liver ROS,,,  Endo/Other  negative endocrine ROS    Renal/GU negative Renal ROS     Musculoskeletal negative musculoskeletal ROS (+)    Abdominal   Peds  Hematology negative hematology ROS (+)   Anesthesia Other Findings   Reproductive/Obstetrics                             Anesthesia Physical Anesthesia Plan  ASA: 3  Anesthesia Plan: General   Post-op Pain Management: Minimal or no pain anticipated   Induction: Intravenous  PONV Risk Score and Plan: 3 and Ondansetron, Dexamethasone and Treatment may vary due to age or medical condition  Airway Management Planned: Oral ETT  Additional Equipment:   Intra-op Plan:   Post-operative Plan: Extubation in OR  Informed Consent: I have reviewed the patients History and Physical, chart, labs and discussed the procedure including the risks, benefits and alternatives for the proposed anesthesia with the patient or authorized representative who has indicated his/her understanding and acceptance.     Dental advisory given  Plan Discussed with: CRNA and Anesthesiologist  Anesthesia Plan Comments:        Anesthesia Quick  Evaluation

## 2023-01-07 ENCOUNTER — Telehealth: Payer: Self-pay | Admitting: Pulmonary Disease

## 2023-01-07 DIAGNOSIS — R296 Repeated falls: Secondary | ICD-10-CM | POA: Diagnosis not present

## 2023-01-07 LAB — PROTIME-INR
INR: 1.9 — ABNORMAL HIGH (ref 0.8–1.2)
Prothrombin Time: 21.2 seconds — ABNORMAL HIGH (ref 11.4–15.2)

## 2023-01-07 MED ORDER — APIXABAN 5 MG PO TABS: 5.0000 mg | ORAL_TABLET | Freq: Two times a day (BID) | ORAL | Status: AC

## 2023-01-07 MED ORDER — FOLIC ACID 1 MG PO TABS: 1.0000 mg | ORAL_TABLET | Freq: Every day | ORAL | Status: DC

## 2023-01-07 NOTE — Discharge Summary (Signed)
Physician Discharge Summary   Patient: [name] MRN: [id number] DOB: 16-Apr-1932  Admit date:     01/01/2023  Discharge date: 01/07/23  Discharge Physician: [name]   PCP: [name], MD (Inactive)   Recommendations at discharge:  Follow up with [facility] to review pathology results from bronchoscopy performed 3/1, pending at discharge. Follow up with spine surgery, Dr. Rolena Infante, after discharge for ongoing management of multiple vertebral compression fractures.   Monitor CBC, BMP.  Note change from coumadin to eliquis for AFib due to supratherapeutic INR and high bleeding risk.   Discharge Diagnoses: Principal Problem:   Recurrent falls Active Problems:   HTN (hypertension)   HLD (hyperlipidemia)   Permanent atrial fibrillation (HCC)   Rhabdomyolysis   Supratherapeutic INR   Hyperkalemia   Elevated transaminase level   Vertebral compression fracture (HCC)   CAD (coronary artery disease)  Hospital Course: [name] is an 89 y.o. female with past medical history significant for but #2 CAD, permanent atrial fibrillation on anticoagulation with Coumadin, mild to moderate MR, mild to moderate TR, hypertension, LVH, hyperlipidemia, pulmonary hypertension, arthritis, chronic back pain, depression, COPD and chronic bronchitis, PAD, GERD, prediabetes, urge incontinence, macular degeneration, B12 deficiency as well as other comorbidities who presents to the ED via EMS after a fall around the night before last.  She reported had been crawling around on the floor and complained of soreness in her legs and low back.  On arrival to the ED she is awake and alert and oriented x 3 and answers all questions appropriately.  She underwent further lab work and testing.  Head CT and C-spine were negative for any acute findings.  CT of the chest was negative for any acute trauma but did show a rounded density in the right bronchus intermedius with complete density  bronchial filling of the right lower lobe bronchi and complete right lower lobe collapse.  The acuity was unclear but it failed to be chronic.  The recommendation is for bronchoscopy but given that she had a supra therapeutic INR we will hold off on pulmonary consultation until her INR starts drifting downward.   CT scan of the thoracic spine showed acute generalized compression fracture T12 with vertebral body 30% height loss and 3 mm posterior superior retropulsion.  There is also noted to have chronic compression fractures of T10 and T8 along with interval kyphoplasty.  She is also noted to have chronic compression fractures of T1, T2, T3 and T4 as well as chronic T12-L1 left paracentral calcified disc extrusion with cranial extension.   A CT scan of the lumbar spine was done and showed increased compression fracture of L3 now with 50% of anterior and central vertebral body height loss and 30% loss of the posterior vertebral body height with posterior superior L3 retropulsion causing moderate spinal canal stenosis.  This is felt to be possibly acute on chronic injury but the radiologist suspects subacute on chronic.   She normally ambulates with a walker but has chronic issues due to her balance due to peripheral neuropathy.  She continues to live alone and has frequent falls and had another fall and she was in the kitchen when she fell and slipped on her buttocks.  She presented for this and was admitted for further workup and PT OT recommending SNF.  She received IVF for mild rhabdomyolysis with improvement. While holding coumadin, pulmonary was consulted due to CT chest imaging showing obstructing lesion in Rt bronchus intermedius and Rt lower  lobe bronchus with atelectasis of Rt lower lobe. Bronchoscopy with brushings and washings collected on 3/1 and the patient has remained stable afterwards. She will pursue rehabilitation at [facility] and follow up with pulmonology.  Assessment and Plan: Recurrent  falls -Appear to be related to chronic issues with balance in the setting of peripheral neuropathy, uses a walker to ambulate.   -She had another fall the other night which appears to be mechanical based on history. Patient denies head injury or loss of consciousness. Denies preceding lightheadedness/dizziness, chest pain, shortness of breath.   - She lives alone, cannot currently have 24/7 assistance. Would benefit from rehabilitation at SNF to which the patient consents at this time.    Permanent A-fib: Rate controlled - Continue diltiazem, holding coumadin but will restart anticoagulation with eliquis (CHA2DS2-VASc score is 4). Age >80 but weight is > 60kg and Cr is 0.6 so using '5mg'$  dose.   Rhabdomyolysis: In the setting of recent fall.  CK 1993 and is now improving and 968 -> 650. Creatinine is at baseline.  - Supportive care now.    Mild hyperkalemia: Resolved    Supratherapeutic INR: No evidence of bleeding on trauma scans.  Hemoglobin is stable. - Holding coumadin, INR allowed to decline to 1.9. Discussed risks and benefits of anticoagulation in general and specific to agents (DOAC vs. continuation of coumadin). The patient does wish to remain on anticoagulation despite fall/bleed risk. She presented with elevated INR and previously stopped eliquis due to cost. At this time she is amenable to using eliquis given the elevated INR.    Elevated transaminases/Abnormal LFTs: Improving steadily. Steatosis on imaging.    Acute T12 vertebral compression fracture on multilevel chronic vertebral compression fractures:  - CT thoracic spine showing acute generalized compression fracture of the T12 vertebral body with 30% height loss and 3 mm posterosuperior retropulsion.  Also showing chronic compression fractures of T10 and T11 with interval kyphoplasty.  Chronic compression fractures of T1, T2, T3, and T4.  Chronic T12-L1 left paracentral calcified disc extrusion with cranial extension.  - CT of  lumbar spine showing increased compression fracture of L3, now with 50% loss of anterior and central ventral body height and 30% loss of the posterior vertebral body height with posterosuperior L3 retropulsion causing moderate spinal canal stenosis.  Possible acute on chronic injury but radiologist suspects it is probably subacute or chronic.   - Regarding T12 compression fracture, the patient has not had any back pain or neurological deficit, so kyphoplasty is not currently indicated.  - Continue pain management as needed and outpatient follow-up with her spine surgeon, Dr. Rolena Infante. - PT/OT eval, fall precautions. Planning rehab at SNF.   Right lower lobe collapse, obstructing lesion in RLL bronchus: CT chest showing rounded density in the right bronchus intermedius with complete low-density bronchial filling of the right lower lobe bronchi and complete right lower lobe collapse.  Acuity is uncertain but favored to be chronic.   -There is a 2.9 cm fluid density structure within the area of atelectatic lung in the right lower lobe.  This is nonspecific. Both neoplastic or infectious etiologies are considered. Bronchoscopy with washing and brushings collected was performed 3/1 by Dr. Silas Flood. [facility] will arrange follow up pending the results which are pending at time of discharge.   CAD: Troponin 18 > 17 and patient is not endorsing chest pain.     Hypertension: Blood pressure stable.   - Continue diltiazem, lisinopril home meds   Hyponatremia: Resolved.  Hyperlipidemia - Ok to restart statin since ALT normalized.     Depression - Continue with sertraline 100 mg p.o. daily   COPD/chronic bronchitis: Stable, no wheezing or shortness of breath. Unclear diagnostic work up PTA.  - Defer work up to pulmonary    GERD - Continue PPI with pantoprazole 40 g p.o. daily.   Prediabetes: HbA1c 6.4% on 10/04/2022.   Peripheral neuropathy - Continue gabapentin.   Hypoalbuminemia:  Supplement protein.   Normocytic anemia: Iron level of 68, UIBC of 162, TIBC of 230, saturation ratios of 30%, ferritin level of 262, folate level 4.1, vitamin B12 [id number] - Started folic acid supplement  DNR: Noted.  Consultants: Pulmonary Procedures performed:  01/06/23 VIDEO BRONCHOSCOPY WITHOUT FLUORO Hunsucker, Bonna Gains, MD  Disposition: Skilled nursing facility Diet recommendation:  Cardiac and Carb modified diet DISCHARGE MEDICATION: Allergies as of 01/07/2023       Reactions   Sulfamethoxazole    Other reaction(s): Other (See Comments) Not listed   Sulfanilamide    unkn Other reaction(s): Other (See Comments) unkn   Sulfur Dioxide    Other reaction(s): Other (See Comments)   Penicillins Rash        Medication List     STOP taking these medications    cephALEXin 500 MG capsule Commonly known as: KEFLEX   cyanocobalamin 1000 MCG/ML injection Commonly known as: VITAMIN B12   oxybutynin 5 MG tablet Commonly known as: DITROPAN   oxyCODONE-acetaminophen 10-325 MG tablet Commonly known as: PERCOCET   potassium chloride SA 20 MEQ tablet Commonly known as: KLOR-CON M   warfarin 2.5 MG tablet Commonly known as: COUMADIN       TAKE these medications    apixaban 5 MG Tabs tablet Commonly known as: ELIQUIS Take 1 tablet (5 mg total) by mouth 2 (two) times daily.   ascorbic acid 500 MG tablet Commonly known as: VITAMIN C Take 600 mg by mouth daily.   atorvastatin 80 MG tablet Commonly known as: LIPITOR Take 1 tablet (80 mg total) by mouth daily.   Biotin 1000 MCG tablet Take 1 tablet by mouth daily.   CALCIUM + D PO Take by mouth daily.   diltiazem 120 MG 24 hr capsule Commonly known as: CARDIZEM CD Take 120 mg by mouth daily.   folic acid 1 MG tablet Commonly known as: FOLVITE Take 1 tablet (1 mg total) by mouth daily. Start taking on: January 08, 2023   gabapentin 300 MG capsule Commonly known as: NEURONTIN Take 300 mg by mouth 2 (two) times  daily.   lisinopril 20 MG tablet Commonly known as: ZESTRIL Take 1 tablet (20 mg total) by mouth daily.   pantoprazole 40 MG tablet Commonly known as: PROTONIX Take 40 mg by mouth daily.   sertraline 100 MG tablet Commonly known as: ZOLOFT Take 1 tablet (100 mg total) by mouth daily.   vitamin A 8000 UNIT capsule Take 8,000 Units by mouth daily.   VITAMIN D PO Take by mouth daily.   Zinc Picolinate Powd Take by mouth.        Contact information for follow-up providers     [name], MD Follow up.   Specialty: Family Medicine Contact information: [address] [place] [address] [phone]              Contact information for after-discharge care     [facility] .   Service: Skilled Nursing  Contact information: [address] [place] [phone]                    Discharge Exam: Danley Danker Weights   01/01/23 2235  Weight: 64.4 kg  BP 131/67 (BP Location: Left Arm)   Pulse 92   Temp 98.7 F (37.1 C)   Resp 18   Ht '5\' 4"'$  (1.626 m)   Wt 64.4 kg   SpO2 93%   BMI 24.37 kg/m   Pleasant, frail elderly female in no distress Squeak in RU zone anteriorly that clears with cough. No wheezes or crackles, nonlabored  Irreg irreg with III/VI systolic murmur at LSB No edema No bleeding  Condition at discharge: stable  The results of significant diagnostics from this hospitalization (including imaging, microbiology, ancillary and laboratory) are listed below for reference.   Imaging Studies: DG CHEST PORT 1 VIEW  Result Date: 01/06/2023 CLINICAL DATA:  Status post bronchoscopy. EXAM: PORTABLE CHEST 1 VIEW COMPARISON:  Chest radiograph 2 days prior and CT chest 01/01/2023. FINDINGS: The cardiomediastinal silhouette is stable. Hazy opacity projecting over the right lower lobe likely reflects right lower lobe collapse as seen  on prior CT. The appearance is unchanged. There is no other focal airspace opacity. There is no pleural effusion. There is no pneumothorax There is no acute osseous abnormality. IMPRESSION: 1. No evidence of pneumothorax following bronchoscopy. 2. Unchanged opacity in the right lower chest likely reflecting right lower lobe collapse. Electronically Signed   By: Valetta Mole M.D.   On: 01/06/2023 14:01   DG Chest 2 View  Result Date: 01/05/2023 CLINICAL DATA:  Atelectasis EXAM: CHEST - 2 VIEW COMPARISON:  CT Chest 01/01/23 FINDINGS: No pleural effusion. No pneumothorax. Hazy opacity in the right lung base likely correlates with the consolidative opacity seen on recent CT chest dated 01/01/2023. Hazy opacity at the left lung base likely correlates with subpleural reticulation seen on recent CT chest. Unchanged cardiac and mediastinal contours. No radiographically apparent displaced rib fractures. Gastric gaseous distention. Kyphoplasty changes in the midthoracic spine. IMPRESSION: No acute abnormality. Electronically Signed   By: Marin Roberts M.D.   On: 01/05/2023 13:00   CT Thoracic Spine Wo Contrast  Result Date: 01/01/2023 CLINICAL DATA:  Fall injury with back trauma.  Barium EXAM: CT  THORACIC AND LUMBAR SPINE WITHOUT CONTRAST TECHNIQUE: Multidetector CT imaging of the thoracic and lumbar spine was performed without intravenous contrast. Multiplanar CT image reconstructions were also generated. RADIATION DOSE REDUCTION: This exam was performed according to the departmental dose-optimization program which includes automated exposure control, adjustment of the mA and/or kV according to patient size and/or use of iterative reconstruction technique. COMPARISON:  MRI thoracic spine 02/03/22, CT lumbar spine 02/01/2022. FINDINGS: CT THORACIC SPINE FINDINGS Segmentation: There are 12 rib-bearing thoracic type segments. Alignment: Minimal to mild upper to midthoracic kyphodextroscoliosis is unchanged. Trace  degenerative anterolisthesis is again noted at T2-3 and T4-5 and up to 3 mm posterosuperior retropulsion noted at T11 and 12 due to a chronic T11 and acute T12 compression fractures. Vertebrae: Generalized osteopenia. There is an acute generalized compression fracture of the T12 vertebral body with intact pedicles and posterior elements and approximately 30% generalized vertebral height loss with 3 mm posterosuperior retropulsion. At T10 and T11 there are chronic compression fractures which were previously acute, with approximately 30% and 50% maximum vertebral height loss, respectively with no increase in compression since the prior study with interval kyphoplasty at both levels. Again noted and  unchanged are mild chronic wedge compression fractures of T1, T2 and T5 and mild-to-moderate chronic compression fractures of T3 and T4. Paraspinal and other soft tissues: There is dorsal paraspinous muscular atrophy which was seen previously. There is mucoid material and debris newly noted filling and obstructing the right lower lobe main and segmental bronchi with right lower lobe collapse. There are trace pleural effusions. Calcifications are partially visible in the thoracic aorta, coronary arteries and mitral ring. Moderate-sized hiatal hernia. Disc levels: The thoracic discs show multilevel degenerative disc height loss, spondylosis and facet spurring. There is no sizable disc herniation above T12 or cord compromise. Posterosuperior retropulsion at T11 and T12 flattens the ventral thecal sac without causing noteworthy spinal stenosis. At T12-L1, again noted is a left paracentral calcified disc extrusion extending cephalad in the epidural space posterior to the T12 body and partially effacing the ventral CSF. This however, was seen previously with no mass effect on the conus. The foramina are again moderately stenotic at T8-9, T9-10, T10-11, and T11-12. No other foraminal compromise. CT LUMBAR SPINE FINDINGS  Segmentation: 5 lumbar type vertebrae. Alignment: There is slight dextroscoliosis. Alignment is physiologic except for a 4 mm posterosuperior retropulsion of the L3 vertebral body cortex. Vertebrae: Osteopenia. There previously was a 25% upper plate anterior wedge compression fracture deformity of L3 which has worsened to approximately 50% loss of the anterior and central vertebral body height and 30% loss of the posterior vertebral body height since the prior study. There are intact anterior superior osteophytes of the L3 body suggesting a chronic or at least subacute process. There is no pedicle or posterior element fracture. The other lumbar vertebrae are normal in heights. No primary pathologic bone lesion is seen. The visualized sacrum and SI joints are intact, with SI joint spurring. Paraspinal and other soft tissues: Chronic fatty atrophy again noted in the dorsal paraspinous and bilateral iliopsoas musculature. Chronic scarring and cortical volume loss inferior pole right kidney. Aortic atherosclerosis without AAA. No mass or paraspinal hematoma. Disc levels: T12-L1: Disc degeneration and vacuum phenomenon with disc calcifications and a calcified left paracentral disc extrusion with cranial extension described above. Clear foramina. L1-2: Mild diffuse disc bulge without herniation or stenosis. Normal disc height. Clear foramina. Trace facet spurring. L2-3: Slight disc space loss. Posterosuperior L3 retropulsion causing moderate spinal canal stenosis along with hypertrophic ligamentous and facet disease. There is foraminal disc bulging, mild bilateral foraminal stenosis. L3-4: Normal disc height. Diffuse disc bulge, dorsal ligamentous and facet hypertrophy causing mild to moderate spinal canal stenosis. Foraminal disc bulging with mild foraminal stenosis. L4-5: Chronic disc collapse, vacuum phenomenon and circumferential disc osteophyte complex, along with dorsal ligamentous and facet hypertrophy causing  moderate acquired spinal canal stenosis. There is foraminal disc bulging and mild-to-moderate foraminal stenosis on the right. L5-S1: Chronic degenerative disc collapse and circumferential disc osteophyte complex. No herniation or canal stenosis. Facet hypertrophy with moderate to severe foraminal stenosis. IMPRESSION: 1. Acute generalized compression fracture of the T12 vertebral body with 30% height loss and 3 mm posterosuperior retropulsion. 2. Chronic compression fractures of T10 and T11 with interval kyphoplasty. 3. Chronic compression fractures of T1, T2, T3, T4. 4. Osteopenia and degenerative change thoracic and lumbar spine as detailed above. 5. Chronic T12-L1 left paracentral calcified disc extrusion with cranial extension. 6. Right lower lobe collapse with mucoid material and debris obstructing the right lower lobe main and segmental bronchi. 7. Trace pleural effusions. 8. Hiatal hernia. 9. Aortic and coronary artery atherosclerosis. 10. Since 02/01/2022 CT,  increased compression fracture of L3, now with 50% loss of the anterior and central vertebral body height and 30% loss of the posterior vertebral body height with Posterosuperior L3 retropulsion causing moderate spinal canal stenosis. Possible this could be acute on chronic injury, but I suspect it is probably subacute or chronic. Aortic Atherosclerosis (ICD10-I70.0). Electronically Signed   By: Telford Nab M.D.   On: 01/01/2023 20:44   CT Lumbar Spine Wo Contrast  Result Date: 01/01/2023 CLINICAL DATA:  Fall injury with back trauma.  Barium EXAM: CT  THORACIC AND LUMBAR SPINE WITHOUT CONTRAST TECHNIQUE: Multidetector CT imaging of the thoracic and lumbar spine was performed without intravenous contrast. Multiplanar CT image reconstructions were also generated. RADIATION DOSE REDUCTION: This exam was performed according to the departmental dose-optimization program which includes automated exposure control, adjustment of the mA and/or kV  according to patient size and/or use of iterative reconstruction technique. COMPARISON:  MRI thoracic spine 02/03/22, CT lumbar spine 02/01/2022. FINDINGS: CT THORACIC SPINE FINDINGS Segmentation: There are 12 rib-bearing thoracic type segments. Alignment: Minimal to mild upper to midthoracic kyphodextroscoliosis is unchanged. Trace degenerative anterolisthesis is again noted at T2-3 and T4-5 and up to 3 mm posterosuperior retropulsion noted at T11 and 12 due to a chronic T11 and acute T12 compression fractures. Vertebrae: Generalized osteopenia. There is an acute generalized compression fracture of the T12 vertebral body with intact pedicles and posterior elements and approximately 30% generalized vertebral height loss with 3 mm posterosuperior retropulsion. At T10 and T11 there are chronic compression fractures which were previously acute, with approximately 30% and 50% maximum vertebral height loss, respectively with no increase in compression since the prior study with interval kyphoplasty at both levels. Again noted and unchanged are mild chronic wedge compression fractures of T1, T2 and T5 and mild-to-moderate chronic compression fractures of T3 and T4. Paraspinal and other soft tissues: There is dorsal paraspinous muscular atrophy which was seen previously. There is mucoid material and debris newly noted filling and obstructing the right lower lobe main and segmental bronchi with right lower lobe collapse. There are trace pleural effusions. Calcifications are partially visible in the thoracic aorta, coronary arteries and mitral ring. Moderate-sized hiatal hernia. Disc levels: The thoracic discs show multilevel degenerative disc height loss, spondylosis and facet spurring. There is no sizable disc herniation above T12 or cord compromise. Posterosuperior retropulsion at T11 and T12 flattens the ventral thecal sac without causing noteworthy spinal stenosis. At T12-L1, again noted is a left paracentral calcified  disc extrusion extending cephalad in the epidural space posterior to the T12 body and partially effacing the ventral CSF. This however, was seen previously with no mass effect on the conus. The foramina are again moderately stenotic at T8-9, T9-10, T10-11, and T11-12. No other foraminal compromise. CT LUMBAR SPINE FINDINGS Segmentation: 5 lumbar type vertebrae. Alignment: There is slight dextroscoliosis. Alignment is physiologic except for a 4 mm posterosuperior retropulsion of the L3 vertebral body cortex. Vertebrae: Osteopenia. There previously was a 25% upper plate anterior wedge compression fracture deformity of L3 which has worsened to approximately 50% loss of the anterior and central vertebral body height and 30% loss of the posterior vertebral body height since the prior study. There are intact anterior superior osteophytes of the L3 body suggesting a chronic or at least subacute process. There is no pedicle or posterior element fracture. The other lumbar vertebrae are normal in heights. No primary pathologic bone lesion is seen. The visualized sacrum and SI joints are intact, with SI  joint spurring. Paraspinal and other soft tissues: Chronic fatty atrophy again noted in the dorsal paraspinous and bilateral iliopsoas musculature. Chronic scarring and cortical volume loss inferior pole right kidney. Aortic atherosclerosis without AAA. No mass or paraspinal hematoma. Disc levels: T12-L1: Disc degeneration and vacuum phenomenon with disc calcifications and a calcified left paracentral disc extrusion with cranial extension described above. Clear foramina. L1-2: Mild diffuse disc bulge without herniation or stenosis. Normal disc height. Clear foramina. Trace facet spurring. L2-3: Slight disc space loss. Posterosuperior L3 retropulsion causing moderate spinal canal stenosis along with hypertrophic ligamentous and facet disease. There is foraminal disc bulging, mild bilateral foraminal stenosis. L3-4: Normal disc  height. Diffuse disc bulge, dorsal ligamentous and facet hypertrophy causing mild to moderate spinal canal stenosis. Foraminal disc bulging with mild foraminal stenosis. L4-5: Chronic disc collapse, vacuum phenomenon and circumferential disc osteophyte complex, along with dorsal ligamentous and facet hypertrophy causing moderate acquired spinal canal stenosis. There is foraminal disc bulging and mild-to-moderate foraminal stenosis on the right. L5-S1: Chronic degenerative disc collapse and circumferential disc osteophyte complex. No herniation or canal stenosis. Facet hypertrophy with moderate to severe foraminal stenosis. IMPRESSION: 1. Acute generalized compression fracture of the T12 vertebral body with 30% height loss and 3 mm posterosuperior retropulsion. 2. Chronic compression fractures of T10 and T11 with interval kyphoplasty. 3. Chronic compression fractures of T1, T2, T3, T4. 4. Osteopenia and degenerative change thoracic and lumbar spine as detailed above. 5. Chronic T12-L1 left paracentral calcified disc extrusion with cranial extension. 6. Right lower lobe collapse with mucoid material and debris obstructing the right lower lobe main and segmental bronchi. 7. Trace pleural effusions. 8. Hiatal hernia. 9. Aortic and coronary artery atherosclerosis. 10. Since 02/01/2022 CT, increased compression fracture of L3, now with 50% loss of the anterior and central vertebral body height and 30% loss of the posterior vertebral body height with Posterosuperior L3 retropulsion causing moderate spinal canal stenosis. Possible this could be acute on chronic injury, but I suspect it is probably subacute or chronic. Aortic Atherosclerosis (ICD10-I70.0). Electronically Signed   By: Telford Nab M.D.   On: 01/01/2023 20:44   CT Chest W Contrast  Result Date: 01/01/2023 CLINICAL DATA:  89 year old post trauma. EXAM: CT CHEST, ABDOMEN, AND PELVIS WITH CONTRAST TECHNIQUE: Multidetector CT imaging of the chest, abdomen  and pelvis was performed following the standard protocol during bolus administration of intravenous contrast. RADIATION DOSE REDUCTION: This exam was performed according to the departmental dose-optimization program which includes automated exposure control, adjustment of the mA and/or kV according to patient size and/or use of iterative reconstruction technique. CONTRAST:  144m OMNIPAQUE IOHEXOL 300 MG/ML  SOLN COMPARISON:  Radiographs earlier today. Chest CT 03/25/2010 FINDINGS: CT CHEST FINDINGS Cardiovascular: No evidence of acute aortic or vascular injury. Aortic atherosclerosis. Aortic valvular and mitral annulus calcifications. The heart is normal in size. There is no pericardial effusion. Mediastinum/Nodes: Scattered small mediastinal lymph nodes are not enlarged by size criteria. Prominent right hilar node is likely reactive. Moderate-sized hiatal hernia no pneumomediastinum. Lungs/Pleura: No pneumothorax. Rounded density in the right bronchus intermedius with complete low-density bronchial filling of the right lower lobe bronchi and complete right lower lobe collapse. There is a 2.9 cm fluid density structure within the area of atelectatic lung in the right lower lobe series 2, image 33. Subpleural reticulation and ground-glass in a peripheral and basilar predominant distribution. No significant pleural effusion. Musculoskeletal: Thoracic spine assessed on concurrent thoracic spine reformats, reported separately. No acute fracture of the ribs,  sternum or included clavicles and shoulder girdles. CT ABDOMEN PELVIS FINDINGS Hepatobiliary: No hepatic injury or perihepatic hematoma. Mild hepatic steatosis. Gallbladder is unremarkable. Pancreas: No evidence of injury. No ductal dilatation or inflammation. Spleen: No splenic injury or perisplenic hematoma. Adrenals/Urinary Tract: No adrenal hemorrhage or renal injury identified. Atrophy and cortical scarring in the lower pole of the right kidney may represent  sequela of prior renal infarct. Bladder is unremarkable. Stomach/Bowel: There is no evidence of bowel injury or mesenteric hematoma. No bowel inflammation or obstruction. Moderate-sized hiatal hernia. Vascular/Lymphatic: No evidence of vascular injury. Prominent aortic atherosclerosis without aneurysm. No retroperitoneal fluid. No gross adenopathy. Reproductive: Status post hysterectomy. No adnexal masses. Other: No free air or free fluid. Musculoskeletal: Lumbar spine assessed on concurrent lumbar spine reformats, reported separately. Bilateral hip arthroplasties. There is no evidence of pelvic fracture. IMPRESSION: 1. No evidence of acute traumatic injury to the chest, abdomen, or pelvis. 2. Reference thoracic and lumbar spine reformats for spinal evaluation. 3. Rounded density in the right bronchus intermedius with complete low-density bronchial filling of the right lower lobe bronchi and complete right lower lobe collapse. Acuity is uncertain but favored to be chronic. There is a 2.9 cm fluid density structure within the area of atelectatic lung in the right lower lobe. This is nonspecific. Both neoplastic or infectious etiologies are considered. Recommend bronchoscopy for further evaluation. 4. Subpleural reticulation and ground-glass in a peripheral and basilar predominant distribution, suspicious for interstitial lung disease. 5. Incidental findings in the abdomen and pelvis of hepatic steatosis and right renal scarring. Moderate-sized hiatal hernia. Aortic Atherosclerosis (ICD10-I70.0). Electronically Signed   By: Keith Rake M.D.   On: 01/01/2023 20:05   CT ABDOMEN PELVIS W CONTRAST  Result Date: 01/01/2023 CLINICAL DATA:  89 year old post trauma. EXAM: CT CHEST, ABDOMEN, AND PELVIS WITH CONTRAST TECHNIQUE: Multidetector CT imaging of the chest, abdomen and pelvis was performed following the standard protocol during bolus administration of intravenous contrast. RADIATION DOSE REDUCTION: This exam  was performed according to the departmental dose-optimization program which includes automated exposure control, adjustment of the mA and/or kV according to patient size and/or use of iterative reconstruction technique. CONTRAST:  160m OMNIPAQUE IOHEXOL 300 MG/ML  SOLN COMPARISON:  Radiographs earlier today. Chest CT 03/25/2010 FINDINGS: CT CHEST FINDINGS Cardiovascular: No evidence of acute aortic or vascular injury. Aortic atherosclerosis. Aortic valvular and mitral annulus calcifications. The heart is normal in size. There is no pericardial effusion. Mediastinum/Nodes: Scattered small mediastinal lymph nodes are not enlarged by size criteria. Prominent right hilar node is likely reactive. Moderate-sized hiatal hernia no pneumomediastinum. Lungs/Pleura: No pneumothorax. Rounded density in the right bronchus intermedius with complete low-density bronchial filling of the right lower lobe bronchi and complete right lower lobe collapse. There is a 2.9 cm fluid density structure within the area of atelectatic lung in the right lower lobe series 2, image 33. Subpleural reticulation and ground-glass in a peripheral and basilar predominant distribution. No significant pleural effusion. Musculoskeletal: Thoracic spine assessed on concurrent thoracic spine reformats, reported separately. No acute fracture of the ribs, sternum or included clavicles and shoulder girdles. CT ABDOMEN PELVIS FINDINGS Hepatobiliary: No hepatic injury or perihepatic hematoma. Mild hepatic steatosis. Gallbladder is unremarkable. Pancreas: No evidence of injury. No ductal dilatation or inflammation. Spleen: No splenic injury or perisplenic hematoma. Adrenals/Urinary Tract: No adrenal hemorrhage or renal injury identified. Atrophy and cortical scarring in the lower pole of the right kidney may represent sequela of prior renal infarct. Bladder is unremarkable. Stomach/Bowel: There is no  evidence of bowel injury or mesenteric hematoma. No bowel  inflammation or obstruction. Moderate-sized hiatal hernia. Vascular/Lymphatic: No evidence of vascular injury. Prominent aortic atherosclerosis without aneurysm. No retroperitoneal fluid. No gross adenopathy. Reproductive: Status post hysterectomy. No adnexal masses. Other: No free air or free fluid. Musculoskeletal: Lumbar spine assessed on concurrent lumbar spine reformats, reported separately. Bilateral hip arthroplasties. There is no evidence of pelvic fracture. IMPRESSION: 1. No evidence of acute traumatic injury to the chest, abdomen, or pelvis. 2. Reference thoracic and lumbar spine reformats for spinal evaluation. 3. Rounded density in the right bronchus intermedius with complete low-density bronchial filling of the right lower lobe bronchi and complete right lower lobe collapse. Acuity is uncertain but favored to be chronic. There is a 2.9 cm fluid density structure within the area of atelectatic lung in the right lower lobe. This is nonspecific. Both neoplastic or infectious etiologies are considered. Recommend bronchoscopy for further evaluation. 4. Subpleural reticulation and ground-glass in a peripheral and basilar predominant distribution, suspicious for interstitial lung disease. 5. Incidental findings in the abdomen and pelvis of hepatic steatosis and right renal scarring. Moderate-sized hiatal hernia. Aortic Atherosclerosis (ICD10-I70.0). Electronically Signed   By: Keith Rake M.D.   On: 01/01/2023 20:05   CT Head Wo Contrast  Result Date: 01/01/2023 CLINICAL DATA:  Head trauma, minor (Age >= 65y); Neck trauma (Age >= 65y) EXAM: CT HEAD WITHOUT CONTRAST CT CERVICAL SPINE WITHOUT CONTRAST TECHNIQUE: Multidetector CT imaging of the head and cervical spine was performed following the standard protocol without intravenous contrast. Multiplanar CT image reconstructions of the cervical spine were also generated. RADIATION DOSE REDUCTION: This exam was performed according to the departmental  dose-optimization program which includes automated exposure control, adjustment of the mA and/or kV according to patient size and/or use of iterative reconstruction technique. COMPARISON:  02/01/2022 FINDINGS: CT HEAD FINDINGS Brain: No evidence of acute infarction, hemorrhage, hydrocephalus, extra-axial collection or mass lesion/mass effect. Extensive low-density changes within the periventricular and subcortical white matter most compatible with chronic microvascular ischemic change. Moderate diffuse cerebral volume loss. Vascular: Atherosclerotic calcifications involving the large vessels of the skull base. No unexpected hyperdense vessel. Skull: Normal. Negative for fracture or focal lesion. Sinuses/Orbits: No acute finding. Other: None. CT CERVICAL SPINE FINDINGS Alignment: Facet joints are aligned without dislocation or traumatic listhesis. Dens and lateral masses are aligned. Skull base and vertebrae: No acute fracture. No primary bone lesion or focal pathologic process. Soft tissues and spinal canal: No prevertebral fluid or swelling. No visible canal hematoma. Disc levels: Advanced facet-predominant multilevel cervical spondylosis. Upper chest: Negative. Other: Bilateral carotid atherosclerosis. IMPRESSION: 1. No acute intracranial abnormality. 2. No acute fracture or subluxation of the cervical spine. Electronically Signed   By: Davina Poke D.O.   On: 01/01/2023 16:27   CT Cervical Spine Wo Contrast  Result Date: 01/01/2023 CLINICAL DATA:  Head trauma, minor (Age >= 65y); Neck trauma (Age >= 65y) EXAM: CT HEAD WITHOUT CONTRAST CT CERVICAL SPINE WITHOUT CONTRAST TECHNIQUE: Multidetector CT imaging of the head and cervical spine was performed following the standard protocol without intravenous contrast. Multiplanar CT image reconstructions of the cervical spine were also generated. RADIATION DOSE REDUCTION: This exam was performed according to the departmental dose-optimization program which  includes automated exposure control, adjustment of the mA and/or kV according to patient size and/or use of iterative reconstruction technique. COMPARISON:  02/01/2022 FINDINGS: CT HEAD FINDINGS Brain: No evidence of acute infarction, hemorrhage, hydrocephalus, extra-axial collection or mass lesion/mass effect. Extensive low-density changes within  the periventricular and subcortical white matter most compatible with chronic microvascular ischemic change. Moderate diffuse cerebral volume loss. Vascular: Atherosclerotic calcifications involving the large vessels of the skull base. No unexpected hyperdense vessel. Skull: Normal. Negative for fracture or focal lesion. Sinuses/Orbits: No acute finding. Other: None. CT CERVICAL SPINE FINDINGS Alignment: Facet joints are aligned without dislocation or traumatic listhesis. Dens and lateral masses are aligned. Skull base and vertebrae: No acute fracture. No primary bone lesion or focal pathologic process. Soft tissues and spinal canal: No prevertebral fluid or swelling. No visible canal hematoma. Disc levels: Advanced facet-predominant multilevel cervical spondylosis. Upper chest: Negative. Other: Bilateral carotid atherosclerosis. IMPRESSION: 1. No acute intracranial abnormality. 2. No acute fracture or subluxation of the cervical spine. Electronically Signed   By: Davina Poke D.O.   On: 01/01/2023 16:27   DG Lumbar Spine Complete  Result Date: 01/01/2023 CLINICAL DATA:  Fall with back pain. EXAM: LUMBAR SPINE - COMPLETE 4+ VIEW COMPARISON:  CT of the lumbar spine peripheral armed on March 06/26/2022 FINDINGS: Osteopenia.  Spinal degenerative changes. Signs of cement augmentation in the lower thoracic spine. Five lumbar type vertebral bodies. Similar appearance of slight increased loss of height suspected also at L1 since imaging from February 01, 2022. Marked narrowing of the disc space at L4-5 and L5-S1 with facet hypertrophy at these levels as well. Signs of  interval at least 40% loss of height of the T12 vertebral body. Subtle lucency across the posterior cortex and mild retropulsion suspected. Difficult to exclude pedicle involvement. No gross change in alignment. T10 and T11 cement augmentation since prior imaging. IMPRESSION: 1. Signs of interval at least 40% loss of height of the T12 vertebral body. Subtle lucency across the posterior cortex and mild retropulsion suspected. Difficult to exclude pedicle involvement. CT may be helpful for further evaluation. 2. Suspect slight interval loss of height also at L3 where there was of compression fracture seen on previous image. Unclear whether this is acute or represents changes related to healing and interval change since February 01, 2022. Correlate with any signs of point tenderness in this location. 3. Signs of cement augmentation of vertebral bodies in the lower thoracic spine at T10 and T11. These results were called by telephone at the time of interpretation on 01/01/2023 at 3:59 pm to provider [facility] , who verbally acknowledged these results. Electronically Signed   By: Zetta Bills M.D.   On: 01/01/2023 15:59   DG Pelvis 1-2 Views  Result Date: 01/01/2023 CLINICAL DATA:  Fall with back pain in a 89 year old female. EXAM: PELVIS - 1-2 VIEW COMPARISON:  Lumbar spine evaluation of the same date. Prior pelvic evaluation from March of 2023 FINDINGS: Post bilateral hip arthroplasty. Femoral component of arthroplasty is are incompletely imaged. There is no sign of acute pelvic process in this osteopenic patient. Incidental note is made of degenerative changes in the lumbar spine. IMPRESSION: 1. Post bilateral hip arthroplasty without signs of acute fracture. 2. Degenerative changes in the lumbar spine. 3. Femoral components of bilateral hip arthroplasty changes are incompletely assessed. Electronically Signed   By: Zetta Bills M.D.   On: 01/01/2023 15:50   DG Chest 2 View  Result Date:  01/01/2023 CLINICAL DATA:  Fall, back pain. EXAM: CHEST - 2 VIEW COMPARISON:  Chest x-ray dated 02/01/2022. FINDINGS: Vague opacity within the RIGHT lower lung, possibly layering pleural effusion. LEFT lung appears clear. No pneumothorax is seen. Heart size and mediastinal contours appear stable. Interval changes of kyphoplasty/vertebroplasty at the  level of the lower thoracic spine, involving at least 2 adjacent vertebral bodies. There is an additional compression fracture deformity at the thoracolumbar junction, of uncertain chronicity. IMPRESSION: 1. Vague opacity within the RIGHT lower lung, possibly layering pleural effusion. Posttraumatic hemothorax not excluded. Neoplastic mass also not excluded. Recommend chest CT for further characterization. 2. Interval changes of kyphoplasty/vertebroplasty at the level of the lower thoracic spine, involving at least 2 adjacent vertebral bodies. 3. There is an additional compression fracture deformity at the thoracolumbar junction, of uncertain chronicity. This can also be further characterized on the chest CT recommended above (ask technologist to make sure that the upper lumbar spine is included). Electronically Signed   By: Franki Cabot M.D.   On: 01/01/2023 15:44    Microbiology: Results for orders placed or performed during the hospital encounter of 01/01/23  SARS Coronavirus 2 by RT PCR (hospital order, performed in [facility] hospital lab) *cepheid single result test* Anterior Nasal Swab     Status: None   Collection Time: 01/06/23  8:54 AM   Specimen: Anterior Nasal Swab  Result Value Ref Range Status   SARS Coronavirus 2 by RT PCR NEGATIVE NEGATIVE Final    Comment: (NOTE) SARS-CoV-2 target nucleic acids are NOT DETECTED.  The SARS-CoV-2 RNA is generally detectable in upper and lower respiratory specimens during the acute phase of infection. The lowest concentration of SARS-CoV-2 viral copies this assay can detect is 250 copies / mL. A negative  result does not preclude SARS-CoV-2 infection and should not be used as the sole basis for treatment or other patient management decisions.  A negative result may occur with improper specimen collection / handling, submission of specimen other than nasopharyngeal swab, presence of viral mutation(s) within the areas targeted by this assay, and inadequate number of viral copies (<250 copies / mL). A negative result must be combined with clinical observations, patient history, and epidemiological information.  Fact Sheet for Patients:   https://www.patel.info/  Fact Sheet for Healthcare Providers: https://hall.com/  This test is not yet approved or  cleared by the [place] FDA and has been authorized for detection and/or diagnosis of SARS-CoV-2 by FDA under an Emergency Use Authorization (EUA).  This EUA will remain in effect (meaning this test can be used) for the duration of the COVID-19 declaration under Section 564(b)(1) of the Act, 21 U.S.C. section 360bbb-3(b)(1), unless the authorization is terminated or revoked sooner.  Performed at [facility], [place] [address], [place], [place] [address]     Labs: CBC: Recent Labs  Lab 01/01/23 1505 01/02/23 0318 01/03/23 0752 01/04/23 0549  WBC 9.7 9.8 7.0 8.1  NEUTROABS 8.4*  --  5.2 6.3  HGB 13.6 11.1* 10.8* 9.8*  HCT 44.8 36.9 35.4* 32.7*  MCV 85.5 85.2 85.3 86.1  PLT 378 331 296 [id number]   Basic Metabolic Panel: Recent Labs  Lab 01/01/23 1505 01/02/23 0318 01/03/23 0752 01/04/23 0549 01/06/23 0421  NA 135 131* 137 137 137  K 5.3* 4.5 3.7 3.3* 3.0*  CL 103 102 108 107 107  CO2 '25 22 23 23 23  '$ GLUCOSE 110* 92 89 99 90  BUN 26* '21 20 13 12  '$ CREATININE 1.04* 0.83 0.74 0.75 0.63  CALCIUM 9.9 9.4 9.0 8.9 9.0  MG  --   --  1.8 1.8  --   PHOS  --   --  3.4 2.9  --    Liver Function Tests: Recent Labs  Lab 01/01/23 1505 01/02/23 0318 01/03/23  [id number]  01/04/23 0549 01/06/23 0421  AST 121* 82* 60* 58* 42*  ALT 73* 59* 51* 49* 44  ALKPHOS 100 78 71 66 69  BILITOT 0.7 0.7 0.8 0.7 0.5  PROT 8.2* 6.7 6.0* 5.7* 5.9*  ALBUMIN 3.1* 2.7* 2.5* 2.5* 2.2*   CBG: No results for input(s): "GLUCAP" in the last 168 hours.  Discharge time spent: greater than 30 minutes.  Signed: [name], MD [facility] 01/07/2023

## 2023-01-07 NOTE — Progress Notes (Signed)
PT TX NOTE  01/07/23 1252  PT Visit Information  Last PT Received On 01/07/23  Assistance Needed Pt returned to bed with nursing assist; requesting PT to come back. Pt amb hallway distance; demonstrates increasing activity tolerance and improved transfers this session. Pt requiring cues for RW safety and position throughout. Will benefit from SNF post acute   History of Present Illness Patient is a 89 year old female presents to the ED via EMS after a fall at home last night. Reportedly patient has been crawling around on the floor and complained of soreness of her legs and low back pain.   imaging revealed acute generalized compression fracture of the T12, multiple other  other compression fxs appear chronic per radiologist  PMH: CAD, permanent A-fib on Coumadin, mild to moderate MR, mild to moderate TR, hypertension, LVH, hyperlipidemia, pulmonary hypertension, arthritis, chronic back pain, depression, COPD and chronic bronchitis, PAD, GERD,  urge incontinence, macular degeneration, B12 deficiency.  Precautions  Precautions Fall;Back  Restrictions  Weight Bearing Restrictions No  Pain Assessment  Pain Assessment No/denies pain  Cognition  Arousal/Alertness Awake/alert  Behavior During Therapy WFL for tasks assessed/performed  Overall Cognitive Status No family/caregiver present to determine baseline cognitive functioning  Area of Impairment Memory;Following commands;Safety/judgement  Memory Decreased short-term memory  Following Commands Follows one step commands consistently  Safety/Judgement Decreased awareness of safety  General Comments patient is cooperative but noted to have poor safety awareness.  Bed Mobility  Overal bed mobility Needs Assistance  Supine to sit Min guard;Supervision  General bed mobility comments able to complete without physical assist , supervision for safety  Transfers  Overall transfer level Needs assistance  Equipment used Rolling walker (2 wheels)   Transfers Sit to/from Stand  Sit to Stand Min guard  General transfer comment pt able to power up through LEs, maintain knee flexion. cues for hand placement and to control descent. pt prefers to pull up on RW (uses rollator at home)  Ambulation/Gait  Ambulation/Gait assistance Min assist;Min guard  Gait Distance (Feet)  (hallway ambulation)  Assistive device Rolling walker (2 wheels)  Gait Pattern/deviations Narrow base of support;Trunk flexed  General Gait Details cues to attempt incr trunk extension and maintain closer proximity RW to self. improved trunk extension  Gait velocity decr  Balance  Sitting balance-Leahy Scale Good  Standing balance support Reliant on assistive device for balance;During functional activity  Standing balance-Leahy Scale Poor  PT - End of Session  Equipment Utilized During Treatment Gait belt  Activity Tolerance Patient tolerated treatment well  Patient left with call bell/phone within reach;in chair;with chair alarm set  Nurse Communication Mobility status   PT - Assessment/Plan  PT Plan Current plan remains appropriate  PT Visit Diagnosis Other abnormalities of gait and mobility (R26.89);Repeated falls (R29.6)  PT Frequency (ACUTE ONLY) Min 2X/week  Follow Up Recommendations Skilled nursing-short term rehab (<3 hours/day)  Can patient physically be transported by private vehicle Yes  Assistance recommended at discharge Intermittent Supervision/Assistance  Patient can return home with the following A little help with walking and/or transfers;A little help with bathing/dressing/bathroom;Help with stairs or ramp for entrance;Assist for transportation  PT equipment None recommended by PT  AM-PAC PT "6 Clicks" Mobility Outcome Measure (Version 2)  Help needed turning from your back to your side while in a flat bed without using bedrails? 3  Help needed moving from lying on your back to sitting on the side of a flat bed without using bedrails? 3  Help  needed  moving to and from a bed to a chair (including a wheelchair)? 3  Help needed standing up from a chair using your arms (e.g., wheelchair or bedside chair)? 3  Help needed to walk in hospital room? 3  Help needed climbing 3-5 steps with a railing?  3  6 Click Score 18  Consider Recommendation of Discharge To: Home with [facility]  PT Goal Progression  Progress towards PT goals Progressing toward goals  Acute Rehab PT Goals  PT Goal Formulation With patient  Time For Goal Achievement 01/16/23  Potential to Achieve Goals Good  PT Time Calculation  PT Start Time (ACUTE ONLY) 1217  PT Stop Time (ACUTE ONLY) 1229  PT Time Calculation (min) (ACUTE ONLY) 12 min  PT General Charges  $$ ACUTE PT VISIT 1 Visit  PT Treatments  $Gait Training 8-22 mins

## 2023-01-07 NOTE — Progress Notes (Signed)
Physical Therapy Treatment Patient Details Name: [name] MRN: [id number] DOB: 10/18/1932 Today's Date: 01/07/2023   History of Present Illness Patient is a 89 year old female presents to the ED via EMS after a fall at home last night. Reportedly patient has been crawling around on the floor and complained of soreness of her legs and low back pain.   imaging revealed acute generalized compression fracture of the T12, multiple other  other compression fxs appear chronic per radiologist  PMH: CAD, permanent A-fib on Coumadin, mild to moderate MR, mild to moderate TR, hypertension, LVH, hyperlipidemia, pulmonary hypertension, arthritis, chronic back pain, depression, COPD and chronic bronchitis, PAD, GERD,  urge incontinence, macular degeneration, B12 deficiency.    PT Comments    Assisted pt to bathroom, left in bathroom per pt request and RN notified. Pt requiring assist for all mobility, continues to have unsteady gait. Will benefit from SNF   Recommendations for follow up therapy are one component of a multi-disciplinary discharge planning process, led by the attending physician.  Recommendations may be updated based on patient status, additional functional criteria and insurance authorization.  Follow Up Recommendations  Skilled nursing-short term rehab (<3 hours/day) Can patient physically be transported by private vehicle: Yes   Assistance Recommended at Discharge Intermittent Supervision/Assistance  Patient can return home with the following A little help with walking and/or transfers;A little help with bathing/dressing/bathroom;Help with stairs or ramp for entrance;Assist for transportation   Equipment Recommendations  None recommended by PT    Recommendations for Other Services       Precautions / Restrictions Precautions Precautions: Fall;Back Restrictions Weight Bearing Restrictions: No     Mobility  Bed Mobility Overal bed mobility: Needs Assistance Bed Mobility:  Supine to Sit     Supine to sit: Min guard, Min assist     General bed mobility comments: pt does not follow  back precautions, reports no back pain with bed mobility    Transfers Overall transfer level: Needs assistance Equipment used: Rolling walker (2 wheels) Transfers: Sit to/from Stand Sit to Stand: Min assist           General transfer comment: cues for knee flexion, feet blocked by PT and tech,  hand placement and to control descent. pt prefers to pull up on RW (uses rollator at home)    Ambulation/Gait Ambulation/Gait assistance: Min assist Gait Distance (Feet): 12 Feet Assistive device: Rolling walker (2 wheels) Gait Pattern/deviations: Narrow base of support, Trunk flexed Gait velocity: decr     General Gait Details: cues to attempt incr trunk extension and maintain closer proximity RW to self   Stairs             Wheelchair Mobility    Modified Rankin (Stroke Patients Only)       Balance                                            Cognition Arousal/Alertness: Awake/alert Behavior During Therapy: WFL for tasks assessed/performed Overall Cognitive Status: No family/caregiver present to determine baseline cognitive functioning Area of Impairment: Memory, Following commands, Safety/judgement                     Memory: Decreased short-term memory Following Commands: Follows one step commands consistently Safety/Judgement: Decreased awareness of safety     General Comments: patient is cooperative but noted to have poor  safety awareness.        Exercises      General Comments        Pertinent Vitals/Pain Pain Assessment Pain Assessment: No/denies pain    Home Living                          Prior Function            PT Goals (current goals can now be found in the care plan section) Acute Rehab PT Goals PT Goal Formulation: With patient Time For Goal Achievement: 01/16/23 Potential to  Achieve Goals: Good Progress towards PT goals: Progressing toward goals    Frequency    Min 2X/week      PT Plan Current plan remains appropriate    Co-evaluation              AM-PAC PT "6 Clicks" Mobility   Outcome Measure  Help needed turning from your back to your side while in a flat bed without using bedrails?: A Little Help needed moving from lying on your back to sitting on the side of a flat bed without using bedrails?: A Little Help needed moving to and from a bed to a chair (including a wheelchair)?: A Little Help needed standing up from a chair using your arms (e.g., wheelchair or bedside chair)?: A Little Help needed to walk in hospital room?: A Little Help needed climbing 3-5 steps with a railing? : A Little 6 Click Score: 18    End of Session Equipment Utilized During Treatment: Gait belt Activity Tolerance: Patient tolerated treatment well Patient left: Other (comment);with call bell/phone within reach (in bathroom, RN notified;) Nurse Communication: Mobility status PT Visit Diagnosis: Other abnormalities of gait and mobility (R26.89);Repeated falls (R29.6)     Time: 1141-1150 PT Time Calculation (min) (ACUTE ONLY): 9 min  Charges:  $Therapeutic Activity: 8-22 mins                     Baxter Flattery, PT  Acute Rehab Dept [facility]) [phone]  [facility] Weekend Pager [facility] only)  [phone]  01/07/2023    [facility] 01/07/2023, 12:47 PM

## 2023-01-07 NOTE — Progress Notes (Signed)
Chaplain asked to speak to pt, as she has been facing significant health setbacks and may benefit from having someone listen to her concerns. Pt mentioned has drawn strength from prayer and her family/friends through this trial. She describes herself as a "strong person" who will "get through by taking it one day at a time." I provided empathic listening, a calm presence and held her hand to pray with her, at her request.    01/07/23 1500  Spiritual Encounters  Type of Visit Initial  Care provided to: Patient  Referral source Nurse (RN/NT/LPN)  Reason for visit Routine spiritual support  Spiritual Framework  Presenting Themes Values and beliefs;Significant life change;Coping tools;Impactful experiences and emotions  Community/Connection Family;Friend(s)  Patient Stress Factors Health changes  Family Stress Factors Health changes  Interventions  Spiritual Care Interventions Made Established relationship of care and support;Compassionate presence;Reflective listening;Prayer  Intervention Outcomes  Outcomes Connection to spiritual care;Reduced isolation

## 2023-01-07 NOTE — Plan of Care (Signed)
  Problem: Clinical Measurements: Goal: Respiratory complications will improve Outcome: Progressing   Problem: Activity: Goal: Risk for activity intolerance will decrease Outcome: Progressing   Problem: Safety: Goal: Ability to remain free from injury will improve Outcome: Progressing   

## 2023-01-07 NOTE — Telephone Encounter (Signed)
Patient with bronchus intermedius endobronchial mass or tumor.  Presumed cancer.  Brushings and washings performed 3/1.  Pathology not resulted yet.  Discussed with patient and daughter at length this admission.  Please schedule follow-up visit with [facility] with either Dr. Valeta Harms, Dr. Verlee Monte, or Dr. Lamonte Sakai in the next 2 to 3 weeks to discuss additional biopsy versus debridement, etc.  She may desire no further evaluation.  If the physicians mentioned are unavailable, I am happy to see her as well, but would be best for her to see advanced bronchoscopist if able.

## 2023-01-08 DIAGNOSIS — R296 Repeated falls: Secondary | ICD-10-CM | POA: Diagnosis not present

## 2023-01-08 DIAGNOSIS — M4850XA Collapsed vertebra, not elsewhere classified, site unspecified, initial encounter for fracture: Secondary | ICD-10-CM | POA: Diagnosis not present

## 2023-01-08 DIAGNOSIS — R2681 Unsteadiness on feet: Secondary | ICD-10-CM | POA: Diagnosis not present

## 2023-01-08 DIAGNOSIS — Z7722 Contact with and (suspected) exposure to environmental tobacco smoke (acute) (chronic): Secondary | ICD-10-CM | POA: Diagnosis not present

## 2023-01-08 DIAGNOSIS — J449 Chronic obstructive pulmonary disease, unspecified: Secondary | ICD-10-CM | POA: Diagnosis not present

## 2023-01-08 DIAGNOSIS — M1712 Unilateral primary osteoarthritis, left knee: Secondary | ICD-10-CM | POA: Diagnosis not present

## 2023-01-08 DIAGNOSIS — S22080D Wedge compression fracture of T11-T12 vertebra, subsequent encounter for fracture with routine healing: Secondary | ICD-10-CM | POA: Diagnosis not present

## 2023-01-08 DIAGNOSIS — E785 Hyperlipidemia, unspecified: Secondary | ICD-10-CM | POA: Diagnosis not present

## 2023-01-08 DIAGNOSIS — S22070D Wedge compression fracture of T9-T10 vertebra, subsequent encounter for fracture with routine healing: Secondary | ICD-10-CM | POA: Diagnosis not present

## 2023-01-08 DIAGNOSIS — I2510 Atherosclerotic heart disease of native coronary artery without angina pectoris: Secondary | ICD-10-CM | POA: Diagnosis not present

## 2023-01-08 DIAGNOSIS — I4821 Permanent atrial fibrillation: Secondary | ICD-10-CM | POA: Diagnosis not present

## 2023-01-08 DIAGNOSIS — R29898 Other symptoms and signs involving the musculoskeletal system: Secondary | ICD-10-CM | POA: Diagnosis not present

## 2023-01-08 DIAGNOSIS — R918 Other nonspecific abnormal finding of lung field: Secondary | ICD-10-CM | POA: Diagnosis not present

## 2023-01-08 DIAGNOSIS — D6869 Other thrombophilia: Secondary | ICD-10-CM | POA: Diagnosis not present

## 2023-01-08 DIAGNOSIS — S22060D Wedge compression fracture of T7-T8 vertebra, subsequent encounter for fracture with routine healing: Secondary | ICD-10-CM | POA: Diagnosis not present

## 2023-01-08 DIAGNOSIS — F32A Depression, unspecified: Secondary | ICD-10-CM | POA: Diagnosis not present

## 2023-01-08 DIAGNOSIS — S22020D Wedge compression fracture of second thoracic vertebra, subsequent encounter for fracture with routine healing: Secondary | ICD-10-CM | POA: Diagnosis not present

## 2023-01-08 DIAGNOSIS — S22040D Wedge compression fracture of fourth thoracic vertebra, subsequent encounter for fracture with routine healing: Secondary | ICD-10-CM | POA: Diagnosis not present

## 2023-01-08 DIAGNOSIS — Z743 Need for continuous supervision: Secondary | ICD-10-CM | POA: Diagnosis not present

## 2023-01-08 DIAGNOSIS — I739 Peripheral vascular disease, unspecified: Secondary | ICD-10-CM | POA: Diagnosis not present

## 2023-01-08 DIAGNOSIS — R262 Difficulty in walking, not elsewhere classified: Secondary | ICD-10-CM | POA: Diagnosis not present

## 2023-01-08 DIAGNOSIS — M6259 Muscle wasting and atrophy, not elsewhere classified, multiple sites: Secondary | ICD-10-CM | POA: Diagnosis not present

## 2023-01-08 DIAGNOSIS — I700 Atherosclerosis of aorta: Secondary | ICD-10-CM | POA: Diagnosis not present

## 2023-01-08 DIAGNOSIS — S22030D Wedge compression fracture of third thoracic vertebra, subsequent encounter for fracture with routine healing: Secondary | ICD-10-CM | POA: Diagnosis not present

## 2023-01-08 DIAGNOSIS — L740 Miliaria rubra: Secondary | ICD-10-CM | POA: Diagnosis not present

## 2023-01-08 DIAGNOSIS — H3530 Unspecified macular degeneration: Secondary | ICD-10-CM | POA: Diagnosis not present

## 2023-01-08 DIAGNOSIS — Z7401 Bed confinement status: Secondary | ICD-10-CM | POA: Diagnosis not present

## 2023-01-08 DIAGNOSIS — I10 Essential (primary) hypertension: Secondary | ICD-10-CM | POA: Diagnosis not present

## 2023-01-08 DIAGNOSIS — M8580 Other specified disorders of bone density and structure, unspecified site: Secondary | ICD-10-CM | POA: Diagnosis not present

## 2023-01-08 LAB — PROTIME-INR
INR: 1.4 — ABNORMAL HIGH (ref 0.8–1.2)
Prothrombin Time: 17.3 seconds — ABNORMAL HIGH (ref 11.4–15.2)

## 2023-01-08 NOTE — TOC Transition Note (Signed)
Transition of Care [facility]) - CM/SW Discharge Note   Patient Details  Name: [name] MRN: [id number] Date of Birth: November 15, 1931  Transition of Care [facility] CM/SW Contact:  Rodney Booze, LCSW Phone Number: 01/08/2023, 9:21 AM   Clinical Narrative:    CSW has reached out to [facility] patient will DC to facility. TOC will arrange transport.     Barriers to Discharge: Continued Medical Work up   Patient Goals and CMS Choice      Discharge Placement                         Discharge Plan and Services Additional resources added to the After Visit Summary for   In-house Referral: Clinical Social Work                                   Social Determinants of Health (SDOH) Interventions SDOH Screenings   Food Insecurity: No Food Insecurity (01/02/2023)  Housing: Low Risk  (01/02/2023)  Recent Concern: Housing - Medium Risk (12/28/2022)  Transportation Needs: No Transportation Needs (01/02/2023)  Utilities: Not At Risk (01/02/2023)  Alcohol Screen: Low Risk  (03/02/2022)  Depression (PHQ2-9): Low Risk  (03/02/2022)  Financial Resource Strain: High Risk (12/28/2022)  Physical Activity: Inactive (05/17/2022)  Stress: Stress Concern Present (03/02/2022)  Tobacco Use: Low Risk  (01/06/2023)     Readmission Risk Interventions     No data to display

## 2023-01-08 NOTE — Progress Notes (Signed)
PROGRESS NOTE  Subjective: Patient seen and examined again this morning. She has no medical complaints. She'd like to have a bath/shower. Still reluctantly agreeable to rehab at [facility]. She's not sure why she didn't go yesterday.   Objective: BP 122/67 (BP Location: Right Arm)   Pulse 81   Temp 98.1 F (36.7 C) (Oral)   Resp 18   Ht '5\' 4"'$  (1.626 m)   Wt 64.4 kg   SpO2 93%   BMI 24.37 kg/m   Gen: Elderly pleasant frail female in no distress Pulm: Clear and nonlabored  CV: Irreg irreg w/rate in [id number], stable systolic murmur, no JVD GI: Soft, NT, ND, +BS  Neuro: Alert and oriented. No focal deficits. Skin: No rashes, lesions or ulcers on visualized skin.  Assessment & Plan: [name] was discharged yesterday morning as anticipated and arranged by TOC/facility on Friday. It is not clear why she did not leave the hospital at that time, though she remains stable for discharge at this time. No changes to the discharge summary are needed at this time.  Jodi Pour, MD Pager on amion 01/08/2023, 9:58 AM

## 2023-01-08 NOTE — Plan of Care (Signed)
  Problem: Safety: Goal: Ability to remain free from injury will improve Outcome: Progressing   

## 2023-01-09 ENCOUNTER — Encounter (HOSPITAL_COMMUNITY): Payer: Self-pay | Admitting: Pulmonary Disease

## 2023-01-09 ENCOUNTER — Telehealth: Payer: Self-pay | Admitting: *Deleted

## 2023-01-09 DIAGNOSIS — M4850XA Collapsed vertebra, not elsewhere classified, site unspecified, initial encounter for fracture: Secondary | ICD-10-CM | POA: Diagnosis not present

## 2023-01-09 DIAGNOSIS — R262 Difficulty in walking, not elsewhere classified: Secondary | ICD-10-CM | POA: Diagnosis not present

## 2023-01-09 DIAGNOSIS — D6869 Other thrombophilia: Secondary | ICD-10-CM | POA: Diagnosis not present

## 2023-01-09 DIAGNOSIS — I4821 Permanent atrial fibrillation: Secondary | ICD-10-CM | POA: Diagnosis not present

## 2023-01-09 LAB — CYTOLOGY - NON PAP

## 2023-01-09 NOTE — Progress Notes (Signed)
  Care Coordination  Outreach Note  01/09/2023 Name: [name] MRN: [id number] DOB: 11-21-1931   Care Coordination Outreach Attempts: An unsuccessful telephone outreach was attempted today to offer the patient information about available care coordination services as a benefit of their health plan.   Follow Up Plan:  Additional outreach attempts will be made to offer the patient care coordination information and services.   Encounter Outcome:  No Answer  Julian Hy, [place] Direct Dial: [phone]

## 2023-01-10 ENCOUNTER — Telehealth: Payer: Self-pay | Admitting: Pulmonary Disease

## 2023-01-10 NOTE — Telephone Encounter (Signed)
Calling to get biopsy results

## 2023-01-11 NOTE — Telephone Encounter (Signed)
Dr. Silas Flood can you please advise on patients biopsy results

## 2023-01-11 NOTE — Progress Notes (Signed)
  Care Coordination  Outreach Note  01/11/2023 Name: [name] MRN: [id number] DOB: 28-Jan-1932   Care Coordination Outreach Attempts: A second unsuccessful outreach was attempted today to offer the patient with information about available care coordination services as a benefit of their health plan.     Follow Up Plan:  Additional outreach attempts will be made to offer the patient care coordination information and services.   Encounter Outcome:  No Answer  Julian Hy, [place] Direct Dial: [phone]

## 2023-01-11 NOTE — Telephone Encounter (Signed)
Attempted to call patient and daughter, [name], from whom telephone encounter was generated.  Went to [employer].  No answer.  Please inform that results of procedure (there is no biopsy, I performed a brushing and a washing) do not reveal a diagnosis.  There are some atypical cells which we can see with malignancy but no further description and not enough to make a diagnosis.  She has an upcoming appoint with Dr. Valeta Harms to discuss additional biopsy etc. which I encourage them to keep.  Please let me know if I can help further clarify.

## 2023-01-11 NOTE — Telephone Encounter (Signed)
Went over results with daughter karen. She verbalized understanding. Nothing further needed.

## 2023-01-11 NOTE — Telephone Encounter (Signed)
ATC X1 LVM for patient/daughter to call our office back

## 2023-01-11 NOTE — Telephone Encounter (Signed)
Pt daughter returning call back for moms biopsy

## 2023-01-13 NOTE — Progress Notes (Signed)
  Care Coordination  Outreach Note  01/13/2023 Name: [name] MRN: [id number] DOB: 1931-12-02   Care Coordination Outreach Attempts: A third unsuccessful outreach was attempted today to offer the patient with information about available care coordination services as a benefit of their health plan.   Follow Up Plan:  No further outreach attempts will be made at this time. We have been unable to contact the patient to offer or enroll patient in care coordination services  Encounter Outcome:  No Answer  Julian Hy, [place] Direct Dial: [phone]

## 2023-01-16 DIAGNOSIS — R262 Difficulty in walking, not elsewhere classified: Secondary | ICD-10-CM | POA: Diagnosis not present

## 2023-01-16 DIAGNOSIS — D6869 Other thrombophilia: Secondary | ICD-10-CM | POA: Diagnosis not present

## 2023-01-16 DIAGNOSIS — M4850XA Collapsed vertebra, not elsewhere classified, site unspecified, initial encounter for fracture: Secondary | ICD-10-CM | POA: Diagnosis not present

## 2023-01-16 DIAGNOSIS — I4821 Permanent atrial fibrillation: Secondary | ICD-10-CM | POA: Diagnosis not present

## 2023-01-19 ENCOUNTER — Other Ambulatory Visit: Payer: Self-pay

## 2023-01-23 ENCOUNTER — Encounter: Payer: Self-pay | Admitting: Pulmonary Disease

## 2023-01-23 ENCOUNTER — Ambulatory Visit (INDEPENDENT_AMBULATORY_CARE_PROVIDER_SITE_OTHER): Payer: Medicare Other | Admitting: Pulmonary Disease

## 2023-01-23 VITALS — BP 130/70 | HR 94 | Ht 64.0 in | Wt 142.0 lb

## 2023-01-23 DIAGNOSIS — Z7722 Contact with and (suspected) exposure to environmental tobacco smoke (acute) (chronic): Secondary | ICD-10-CM

## 2023-01-23 DIAGNOSIS — R918 Other nonspecific abnormal finding of lung field: Secondary | ICD-10-CM | POA: Diagnosis not present

## 2023-01-23 NOTE — Progress Notes (Signed)
Synopsis: Referred in March 2024 for pulmonary nodule, lung mass by No ref. provider found  Subjective:   PATIENT ID: [name] GENDER: female DOB: [birth year], MRN: [id number]  Chief Complaint  Patient presents with   Colon. F/up, lung nodule.    This is a 89 year old female, past medical history of hypertension, atrial fibrillation on Coumadin.Patient was recently in the emergency room and admitted after a fall at the end of February to [facility] long hospital.  Patient was taken for bronchoscopy after CT revealed there was endobronchial lesion.  Bronchoscopy revealed a endobronchial lesion and he had some endobronchial brushings that were complete by Dr. Silas Flood.  Pathology on this was atypical cells but there was visible tumor in the airway.  He was referred here for discussion for consideration of repeat bronchoscopy, tumor debulking    Past Medical History:  Diagnosis Date   Arthritis    Back pain, chronic    Coronary artery disease CARDIOLOGIST- DR [name]--  LOV IN EPIC   Depression    Hammer toe    Hypercholesterolemia    Hypertensive heart disease    WITH LVH   LVH (left ventricular hypertrophy) due to hypertensive disease    Mitral insufficiency    CHRONIC   Mitral regurgitation    Permanent atrial fibrillation (HCC)    Pulmonary hypertension (HCC)    Recurrent falls    Transfusion history      Family History  Problem Relation Age of Onset   Heart disease Mother 32   Prostate cancer Father 2     Past Surgical History:  Procedure Laterality Date   ABDOMINAL AORTOGRAM W/LOWER EXTREMITY N/A 04/29/2020   Procedure: ABDOMINAL AORTOGRAM W/LOWER EXTREMITY;  Surgeon: [name], MD;  Location: [place] [facility];  Service: Cardiovascular;  Laterality: N/A;  Lt Leg   ABDOMINAL HYSTERECTOMY     ANTERIOR REPAIR/ [facility] PUBOVAGINAL SLING  05-03-2002   BRONCHIAL BRUSHINGS  01/06/2023   Procedure: BRONCHIAL BRUSHINGS;  Surgeon: Lanier Clam,  MD;  Location: [facility];  Service: Endoscopy;;   BRONCHIAL WASHINGS  01/06/2023   Procedure: BRONCHIAL WASHINGS;  Surgeon: Lanier Clam, MD;  Location: [facility];  Service: Endoscopy;;   BUNIONECTOMY WITH HAMMERTOE RECONSTRUCTION Right 03/01/2013   Procedure: RIGHT FOOT EXCISION BUNIONETTE AND FUSION OF DIP FOURTH TOE;  Surgeon: [name], MD;  Location: [place];  Service: Orthopedics;  Laterality: Right;   CARDIAC CATHETERIZATION  02-03-2009  DR  [name]   SINGLE-VESSEL OBSTRUCTIVE CAD, SMALL DIAGONAL BRANCH/ NORMAL LVF/ SEVERE MITRAL INSUFFICIENCY/ SEVERE PULMONARY HYPERTENSION   CATARACT EXTRACTION W/ INTRAOCULAR LENS  IMPLANT, BILATERAL  2003   COLONOSCOPY  07/25/2014   few mall scattered diverticula in the ectire examined colon. Small internal hemmorhoids.    HAMMER TOE SURGERY  02/28/2012   Procedure: HAMMER TOE CORRECTION;  Surgeon: [name], MD;  Location: [facility];  Service: Orthopedics;  Laterality: Right;  FUSION OF PROXIMAL INTERPHALANGEAL  RIGHT THIRD CLAW TOE   HEMOSTASIS CONTROL  01/06/2023   Procedure: HEMOSTASIS CONTROL;  Surgeon: Lanier Clam, MD;  Location: [facility];  Service: Endoscopy;;   PARS PLANA VITRECTOMY W/ REPAIR OF MACULAR HOLE  02-04-2002   LEFT EYE   PERIPHERAL VASCULAR BALLOON ANGIOPLASTY  04/29/2020   Procedure: PERIPHERAL VASCULAR BALLOON ANGIOPLASTY;  Surgeon: [name], MD;  Location: [place] [facility];  Service: Cardiovascular;;  Lt. AT   REPAIR RIGHT SECOND CLAW TOE/ VARUS MEDIAL  CLOSING WEDGE OSTEOTOMY PROXIMAL PHALANX RIGHT GREAT TOE  05-05-2005   TOTAL HIP ARTHROPLASTY     BILATERAL---  LEFT 4/98;   RIGHT  11/98   VARUS OSTEOTOMY PROXIMAL PHALANX, LEFT GREAT TOE/ PARING DOWN THE CALLUS , SECOND LEFT TOE  10-27-2008   VIDEO BRONCHOSCOPY N/A 01/06/2023   Procedure: VIDEO BRONCHOSCOPY WITHOUT FLUORO;  Surgeon: Lanier Clam, MD;  Location: [facility];  Service:  Endoscopy;  Laterality: N/A;    Social History   Socioeconomic History   Marital status: Divorced    Spouse name: Not on file   Number of children: 3   Years of education: College   Highest education level: Not on file  Occupational History   Occupation: Retired    Fish farm manager: RETIRED  Tobacco Use   Smoking status: Never   Smokeless tobacco: Never  Vaping Use   Vaping Use: Never used  Substance and Sexual Activity   Alcohol use: Not Currently    Alcohol/week: 1.0 standard drink of alcohol    Types: 1 Glasses of wine per week    Comment: occasional   Drug use: Yes    Types: Hydrocodone   Sexual activity: Not Currently  Other Topics Concern   Not on file  Social History Narrative   Patient lives at home alone.   Caffeine Use: 1 cup daily occasionally   Social Determinants of Health   Financial Resource Strain: High Risk (12/28/2022)   Overall Financial Resource Strain (CARDIA)    Difficulty of Paying Living Expenses: Very hard  Food Insecurity: No Food Insecurity (01/02/2023)   Hunger Vital Sign    Worried About Running Out of Food in the Last Year: Never true    Ran Out of Food in the Last Year: Never true  Transportation Needs: No Transportation Needs (01/02/2023)   PRAPARE - Hydrologist (Medical): No    Lack of Transportation (Non-Medical): No  Physical Activity: Inactive (05/17/2022)   Exercise Vital Sign    Days of Exercise per Week: 0 days    Minutes of Exercise per Session: 0 min  Stress: Stress Concern Present (03/02/2022)   [facility]    Feeling of Stress : To some extent  Social Connections: Not on file  Intimate Partner Violence: Not At Risk (01/02/2023)   Humiliation, Afraid, Rape, and Kick questionnaire    Fear of Current or Ex-Partner: No    Emotionally Abused: No    Physically Abused: No    Sexually Abused: No     No Active Allergies   Outpatient  Medications Prior to Visit  Medication Sig Dispense Refill   apixaban (ELIQUIS) 5 MG TABS tablet Take 1 tablet (5 mg total) by mouth 2 (two) times daily. 60 tablet    ascorbic acid (VITAMIN C) 500 MG tablet Take 600 mg by mouth daily.      Biotin 1000 MCG tablet Take 1 tablet by mouth daily.      Calcium Carbonate-Vitamin D (CALCIUM + D PO) Take by mouth daily.     Cholecalciferol (VITAMIN D PO) Take by mouth daily.     diltiazem (CARDIZEM CD) 120 MG 24 hr capsule Take 120 mg by mouth daily.     folic acid (FOLVITE) 1 MG tablet Take 1 tablet (1 mg total) by mouth daily.     gabapentin (NEURONTIN) 300 MG capsule Take 300 mg by mouth 2 (two) times daily.     lisinopril (ZESTRIL) 20  MG tablet Take 1 tablet (20 mg total) by mouth daily. 90 tablet 2   pantoprazole (PROTONIX) 40 MG tablet Take 40 mg by mouth daily.     sertraline (ZOLOFT) 100 MG tablet Take 1 tablet (100 mg total) by mouth daily. 90 tablet 0   vitamin A 8000 UNIT capsule Take 8,000 Units by mouth daily.     Zinc Picolinate POWD Take by mouth.     atorvastatin (LIPITOR) 80 MG tablet Take 1 tablet (80 mg total) by mouth daily. 90 tablet 3   No facility-administered medications prior to visit.    Review of Systems  Constitutional:  Positive for malaise/fatigue. Negative for chills, fever and weight loss.  HENT:  Negative for hearing loss, sore throat and tinnitus.   Eyes:  Negative for blurred vision and double vision.  Respiratory:  Negative for cough, hemoptysis, sputum production, shortness of breath, wheezing and stridor.   Cardiovascular:  Negative for chest pain, palpitations, orthopnea, leg swelling and PND.  Gastrointestinal:  Negative for abdominal pain, constipation, diarrhea, heartburn, nausea and vomiting.  Genitourinary:  Negative for dysuria, hematuria and urgency.  Musculoskeletal:  Negative for joint pain and myalgias.  Skin:  Negative for itching and rash.  Neurological:  Negative for dizziness, tingling,  weakness and headaches.  Endo/Heme/Allergies:  Negative for environmental allergies. Does not bruise/bleed easily.  Psychiatric/Behavioral:  Negative for depression. The patient is not nervous/anxious and does not have insomnia.   All other systems reviewed and are negative.    Objective:  Physical Exam Vitals reviewed.  Constitutional:      General: She is not in acute distress.    Appearance: She is well-developed.  HENT:     Head: Normocephalic and atraumatic.     Mouth/Throat:     Pharynx: No oropharyngeal exudate.  Eyes:     Conjunctiva/sclera: Conjunctivae normal.     Pupils: Pupils are equal, round, and reactive to light.  Neck:     Vascular: No JVD.     Trachea: No tracheal deviation.     Comments: Loss of supraclavicular fat Cardiovascular:     Rate and Rhythm: Normal rate. Rhythm irregular.     Heart sounds: S1 normal and S2 normal. Murmur heard.     Comments: Distant heart tones Pulmonary:     Effort: No tachypnea or accessory muscle usage.     Breath sounds: No stridor. No wheezing, rhonchi or rales. Decreased breath sounds: throughout all lung fields.    Comments: Diminished breath sounds in the right base compared to the left Abdominal:     General: Bowel sounds are normal. There is no distension.     Palpations: Abdomen is soft.     Tenderness: There is no abdominal tenderness.  Musculoskeletal:        General: Deformity (muscle wasting ) present.  Skin:    General: Skin is warm and dry.     Capillary Refill: Capillary refill takes less than 2 seconds.     Findings: No rash.  Neurological:     Mental Status: She is alert and oriented to person, place, and time.  Psychiatric:        Behavior: Behavior normal.      Vitals:   01/23/23 1308  BP: 130/70  Pulse: 94  SpO2: 91%  Weight: 142 lb (64.4 kg)  Height: 5\' 4"  (1.626 m)   91% on RA BMI Readings from Last 3 Encounters:  01/23/23 24.37 kg/m  01/04/23 24.37 kg/m  10/04/22 24.37 kg/m  Wt  Readings from Last 3 Encounters:  01/23/23 142 lb (64.4 kg)  01/01/23 141 lb 15.6 oz (64.4 kg)  10/04/22 142 lb (64.4 kg)     CBC    Component Value Date/Time   WBC 8.1 01/04/2023 0549   RBC 3.80 (L) 01/04/2023 0549   HGB 9.8 (L) 01/04/2023 0549   HGB 11.3 10/04/2022 1140   HCT 32.7 (L) 01/04/2023 0549   HCT 36.5 10/04/2022 1140   PLT 290 01/04/2023 0549   PLT 353 10/04/2022 1140   MCV 86.1 01/04/2023 0549   MCV 88 10/04/2022 1140   MCH 25.8 (L) 01/04/2023 0549   MCHC 30.0 01/04/2023 0549   RDW 17.5 (H) 01/04/2023 0549   RDW 13.9 10/04/2022 1140   LYMPHSABS 0.8 01/04/2023 0549   LYMPHSABS 0.8 10/04/2022 1140   MONOABS 0.8 01/04/2023 0549   EOSABS 0.1 01/04/2023 0549   EOSABS 0.1 10/04/2022 1140   BASOSABS 0.1 01/04/2023 0549   BASOSABS 0.0 10/04/2022 1140    Chest Imaging:  CT chest February 2024: Patient has a endobronchial lesion within the right mainstem.  Occlusion and atelectasis of the right lower lobe. The patient's images have been independently reviewed by me.    Pulmonary Functions Testing Results:     No data to display          FeNO:   Pathology:   Echocardiogram:   Heart Catheterization:     Assessment & Plan:     ICD-10-CM   1. Endobronchial mass  R91.8 Ambulatory referral to Pulmonology    Procedural/ Surgical Case Request: VIDEO BRONCHOSCOPY WITHOUT FLUORO    Novel Coronavirus, NAA ([facility])    2. Second hand smoke exposure  Z77.22       Discussion:  This is a 89 year old female, secondhand smoke exposure, lifelong non-smoker, atrial fibrillation on Coumadin.  Here today to discuss repeat bronchoscopy with consideration for cryotherapy, tumor debulking and biopsy of the endobronchial masses seen on last bronchoscopy.  Plan: Will need to hold her Coumadin at least 5 days prior to case. Will plan for bronchoscopy and biopsy. Review of the images would suggest that there is potentially necrotic tumor and behind this. Will try to  ensure that there is patency distal to the right lower lobe openings but I explained that only these biopsies would hopefully give [place] a diagnosis as well as some palliative treatments for opening the distal airway to help prevent obstructive pneumonias. Patient's daughters were present for this discussion. Planned bronchoscopy date will be on 02/07/2023. Patient return to clinic 1 week for postop visit afterwards.    Current Outpatient Medications:    apixaban (ELIQUIS) 5 MG TABS tablet, Take 1 tablet (5 mg total) by mouth 2 (two) times daily., Disp: 60 tablet, Rfl:    ascorbic acid (VITAMIN C) 500 MG tablet, Take 600 mg by mouth daily. , Disp: , Rfl:    Biotin 1000 MCG tablet, Take 1 tablet by mouth daily. , Disp: , Rfl:    Calcium Carbonate-Vitamin D (CALCIUM + D PO), Take by mouth daily., Disp: , Rfl:    Cholecalciferol (VITAMIN D PO), Take by mouth daily., Disp: , Rfl:    diltiazem (CARDIZEM CD) 120 MG 24 hr capsule, Take 120 mg by mouth daily., Disp: , Rfl:    folic acid (FOLVITE) 1 MG tablet, Take 1 tablet (1 mg total) by mouth daily., Disp: , Rfl:    gabapentin (NEURONTIN) 300 MG capsule, Take 300 mg by mouth 2 (two) times daily., Disp: ,  Rfl:    lisinopril (ZESTRIL) 20 MG tablet, Take 1 tablet (20 mg total) by mouth daily., Disp: 90 tablet, Rfl: 2   pantoprazole (PROTONIX) 40 MG tablet, Take 40 mg by mouth daily., Disp: , Rfl:    sertraline (ZOLOFT) 100 MG tablet, Take 1 tablet (100 mg total) by mouth daily., Disp: 90 tablet, Rfl: 0   vitamin A 8000 UNIT capsule, Take 8,000 Units by mouth daily., Disp: , Rfl:    Zinc Picolinate POWD, Take by mouth., Disp: , Rfl:    atorvastatin (LIPITOR) 80 MG tablet, Take 1 tablet (80 mg total) by mouth daily., Disp: 90 tablet, Rfl: 3  I spent 62 minutes dedicated to the care of this patient on the date of this encounter to include pre-visit review of records, face-to-face time with the patient discussing conditions above, post visit ordering of  testing, clinical documentation with the electronic health record, making appropriate referrals as documented, and communicating necessary findings to members of the patients care team.   Garner Nash, [facility] 01/23/2023 1:37 PM

## 2023-01-23 NOTE — H&P (View-Only) (Signed)
 Synopsis: Referred in March 2024 for pulmonary nodule, lung mass by No ref. provider found  Subjective:   PATIENT ID: [name] GENDER: female DOB: 03/27/1932, MRN: [id number]  Chief Complaint  Patient presents with   Consult    Hosp. F/up, lung nodule.    This is a 89-year-old female, past medical history of hypertension, atrial fibrillation on Coumadin.Patient was recently in the emergency room and admitted after a fall at the end of February to [facility].  Patient was taken for bronchoscopy after CT revealed there was endobronchial lesion.  Bronchoscopy revealed a endobronchial lesion and he had some endobronchial brushings that were complete by Dr. Hunsucker.  Pathology on this was atypical cells but there was visible tumor in the airway.  He was referred here for discussion for consideration of repeat bronchoscopy, tumor debulking    Past Medical History:  Diagnosis Date   Arthritis    Back pain, chronic    Coronary artery disease CARDIOLOGIST- DR JORDAN--  LOV IN EPIC   Depression    Hammer toe    Hypercholesterolemia    Hypertensive heart disease    WITH LVH   LVH (left ventricular hypertrophy) due to hypertensive disease    Mitral insufficiency    CHRONIC   Mitral regurgitation    Permanent atrial fibrillation (HCC)    Pulmonary hypertension (HCC)    Recurrent falls    Transfusion history      Family History  Problem Relation Age of Onset   Heart disease Mother 84   Prostate cancer Father 57     Past Surgical History:  Procedure Laterality Date   ABDOMINAL AORTOGRAM W/LOWER EXTREMITY N/A 04/29/2020   Procedure: ABDOMINAL AORTOGRAM W/LOWER EXTREMITY;  Surgeon: Arida, Muhammad A, MD;  Location: [facility];  Service: Cardiovascular;  Laterality: N/A;  Lt Leg   ABDOMINAL HYSTERECTOMY     ANTERIOR REPAIR/ SPARC PUBOVAGINAL SLING  05-03-2002   BRONCHIAL BRUSHINGS  01/06/2023   Procedure: BRONCHIAL BRUSHINGS;  Surgeon: Hunsucker, Matthew R,  MD;  Location: [facility];  Service: Endoscopy;;   BRONCHIAL WASHINGS  01/06/2023   Procedure: BRONCHIAL WASHINGS;  Surgeon: Hunsucker, Matthew R, MD;  Location: [facility];  Service: Endoscopy;;   BUNIONECTOMY WITH HAMMERTOE RECONSTRUCTION Right 03/01/2013   Procedure: RIGHT FOOT EXCISION BUNIONETTE AND FUSION OF DIP FOURTH TOE;  Surgeon: James P Aplington, MD;  Location: [facility];  Service: Orthopedics;  Laterality: Right;   CARDIAC CATHETERIZATION  02-03-2009  DR  JORDAN   SINGLE-VESSEL OBSTRUCTIVE CAD, SMALL DIAGONAL BRANCH/ NORMAL LVF/ SEVERE MITRAL INSUFFICIENCY/ SEVERE PULMONARY HYPERTENSION   CATARACT EXTRACTION W/ INTRAOCULAR LENS  IMPLANT, BILATERAL  2003   COLONOSCOPY  07/25/2014   few mall scattered diverticula in the ectire examined colon. Small internal hemmorhoids.    HAMMER TOE SURGERY  02/28/2012   Procedure: HAMMER TOE CORRECTION;  Surgeon: James P Aplington, MD;  Location: [facility];  Service: Orthopedics;  Laterality: Right;  FUSION OF PROXIMAL INTERPHALANGEAL  RIGHT THIRD CLAW TOE   HEMOSTASIS CONTROL  01/06/2023   Procedure: HEMOSTASIS CONTROL;  Surgeon: Hunsucker, Matthew R, MD;  Location: [facility];  Service: Endoscopy;;   PARS PLANA VITRECTOMY W/ REPAIR OF MACULAR HOLE  02-04-2002   LEFT EYE   PERIPHERAL VASCULAR BALLOON ANGIOPLASTY  04/29/2020   Procedure: PERIPHERAL VASCULAR BALLOON ANGIOPLASTY;  Surgeon: Arida, Muhammad A, MD;  [facility]: [facility];  Service: Cardiovascular;;  Lt. AT   REPAIR RIGHT SECOND CLAW TOE/ VARUS MEDIAL   CLOSING WEDGE OSTEOTOMY PROXIMAL PHALANX RIGHT GREAT TOE  05-05-2005   TOTAL HIP ARTHROPLASTY     BILATERAL---  LEFT 4/98;   RIGHT  11/98   VARUS OSTEOTOMY PROXIMAL PHALANX, LEFT GREAT TOE/ PARING DOWN THE CALLUS , SECOND LEFT TOE  10-27-2008   VIDEO BRONCHOSCOPY N/A 01/06/2023   Procedure: VIDEO BRONCHOSCOPY WITHOUT FLUORO;  Surgeon: Hunsucker, Matthew R, MD;  Location: [facility];  Service:  Endoscopy;  Laterality: N/A;    Social History   Socioeconomic History   Marital status: Divorced    Spouse name: Not on file   Number of children: 3   Years of education: College   Highest education level: Not on file  Occupational History   Occupation: Retired    Employer: RETIRED  Tobacco Use   Smoking status: Never   Smokeless tobacco: Never  Vaping Use   Vaping Use: Never used  Substance and Sexual Activity   Alcohol use: Not Currently    Alcohol/week: 1.0 standard drink of alcohol    Types: 1 Glasses of wine per week    Comment: occasional   Drug use: Yes    Types: Hydrocodone   Sexual activity: Not Currently  Other Topics Concern   Not on file  Social History Narrative   Patient lives at home alone.   Caffeine Use: 1 cup daily occasionally   Social Determinants of Health   Financial Resource Strain: High Risk (12/28/2022)   Overall Financial Resource Strain (CARDIA)    Difficulty of Paying Living Expenses: Very hard  Food Insecurity: No Food Insecurity (01/02/2023)   Hunger Vital Sign    Worried About Running Out of Food in the Last Year: Never true    Ran Out of Food in the Last Year: Never true  Transportation Needs: No Transportation Needs (01/02/2023)   PRAPARE - Transportation    Lack of Transportation (Medical): No    Lack of Transportation (Non-Medical): No  Physical Activity: Inactive (05/17/2022)   Exercise Vital Sign    Days of Exercise per Week: 0 days    Minutes of Exercise per Session: 0 min  Stress: Stress Concern Present (03/02/2022)   [employer] - Occupational Stress Questionnaire    Feeling of Stress : To some extent  Social Connections: Not on file  Intimate Partner Violence: Not At Risk (01/02/2023)   Humiliation, Afraid, Rape, and Kick questionnaire    Fear of Current or Ex-Partner: No    Emotionally Abused: No    Physically Abused: No    Sexually Abused: No     No Active Allergies   Outpatient  Medications Prior to Visit  Medication Sig Dispense Refill   apixaban (ELIQUIS) 5 MG TABS tablet Take 1 tablet (5 mg total) by mouth 2 (two) times daily. 60 tablet    ascorbic acid (VITAMIN C) 500 MG tablet Take 600 mg by mouth daily.      Biotin 1000 MCG tablet Take 1 tablet by mouth daily.      Calcium Carbonate-Vitamin D (CALCIUM + D PO) Take by mouth daily.     Cholecalciferol (VITAMIN D PO) Take by mouth daily.     diltiazem (CARDIZEM CD) 120 MG 24 hr capsule Take 120 mg by mouth daily.     folic acid (FOLVITE) 1 MG tablet Take 1 tablet (1 mg total) by mouth daily.     gabapentin (NEURONTIN) 300 MG capsule Take 300 mg by mouth 2 (two) times daily.     lisinopril (ZESTRIL) 20   MG tablet Take 1 tablet (20 mg total) by mouth daily. 90 tablet 2   pantoprazole (PROTONIX) 40 MG tablet Take 40 mg by mouth daily.     sertraline (ZOLOFT) 100 MG tablet Take 1 tablet (100 mg total) by mouth daily. 90 tablet 0   vitamin A 8000 UNIT capsule Take 8,000 Units by mouth daily.     Zinc Picolinate POWD Take by mouth.     atorvastatin (LIPITOR) 80 MG tablet Take 1 tablet (80 mg total) by mouth daily. 90 tablet 3   No facility-administered medications prior to visit.    Review of Systems  Constitutional:  Positive for malaise/fatigue. Negative for chills, fever and weight loss.  HENT:  Negative for hearing loss, sore throat and tinnitus.   Eyes:  Negative for blurred vision and double vision.  Respiratory:  Negative for cough, hemoptysis, sputum production, shortness of breath, wheezing and stridor.   Cardiovascular:  Negative for chest pain, palpitations, orthopnea, leg swelling and PND.  Gastrointestinal:  Negative for abdominal pain, constipation, diarrhea, heartburn, nausea and vomiting.  Genitourinary:  Negative for dysuria, hematuria and urgency.  Musculoskeletal:  Negative for joint pain and myalgias.  Skin:  Negative for itching and rash.  Neurological:  Negative for dizziness, tingling,  weakness and headaches.  Endo/Heme/Allergies:  Negative for environmental allergies. Does not bruise/bleed easily.  Psychiatric/Behavioral:  Negative for depression. The patient is not nervous/anxious and does not have insomnia.   All other systems reviewed and are negative.    Objective:  Physical Exam Vitals reviewed.  Constitutional:      General: She is not in acute distress.    Appearance: She is well-developed.  HENT:     Head: Normocephalic and atraumatic.     Mouth/Throat:     Pharynx: No oropharyngeal exudate.  Eyes:     Conjunctiva/sclera: Conjunctivae normal.     Pupils: Pupils are equal, round, and reactive to light.  Neck:     Vascular: No JVD.     Trachea: No tracheal deviation.     Comments: Loss of supraclavicular fat Cardiovascular:     Rate and Rhythm: Normal rate. Rhythm irregular.     Heart sounds: S1 normal and S2 normal. Murmur heard.     Comments: Distant heart tones Pulmonary:     Effort: No tachypnea or accessory muscle usage.     Breath sounds: No stridor. No wheezing, rhonchi or rales. Decreased breath sounds: throughout all lung fields.    Comments: Diminished breath sounds in the right base compared to the left Abdominal:     General: Bowel sounds are normal. There is no distension.     Palpations: Abdomen is soft.     Tenderness: There is no abdominal tenderness.  Musculoskeletal:        General: Deformity (muscle wasting ) present.  Skin:    General: Skin is warm and dry.     Capillary Refill: Capillary refill takes less than 2 seconds.     Findings: No rash.  Neurological:     Mental Status: She is alert and oriented to person, place, and time.  Psychiatric:        Behavior: Behavior normal.      Vitals:   01/23/23 1308  BP: 130/70  Pulse: 94  SpO2: 91%  Weight: 142 lb (64.4 kg)  Height: 5' 4" (1.626 m)   91% on RA BMI Readings from Last 3 Encounters:  01/23/23 24.37 kg/m  01/04/23 24.37 kg/m  10/04/22 24.37 kg/m     Wt  Readings from Last 3 Encounters:  01/23/23 142 lb (64.4 kg)  01/01/23 141 lb 15.6 oz (64.4 kg)  10/04/22 142 lb (64.4 kg)     CBC    Component Value Date/Time   WBC 8.1 01/04/2023 0549   RBC 3.80 (L) 01/04/2023 0549   HGB 9.8 (L) 01/04/2023 0549   HGB 11.3 10/04/2022 1140   HCT 32.7 (L) 01/04/2023 0549   HCT 36.5 10/04/2022 1140   PLT 290 01/04/2023 0549   PLT 353 10/04/2022 1140   MCV 86.1 01/04/2023 0549   MCV 88 10/04/2022 1140   MCH 25.8 (L) 01/04/2023 0549   MCHC 30.0 01/04/2023 0549   RDW 17.5 (H) 01/04/2023 0549   RDW 13.9 10/04/2022 1140   LYMPHSABS 0.8 01/04/2023 0549   LYMPHSABS 0.8 10/04/2022 1140   MONOABS 0.8 01/04/2023 0549   EOSABS 0.1 01/04/2023 0549   EOSABS 0.1 10/04/2022 1140   BASOSABS 0.1 01/04/2023 0549   BASOSABS 0.0 10/04/2022 1140    Chest Imaging:  CT chest February 2024: Patient has a endobronchial lesion within the right mainstem.  Occlusion and atelectasis of the right lower lobe. The patient's images have been independently reviewed by me.    Pulmonary Functions Testing Results:     No data to display          FeNO:   Pathology:   Echocardiogram:   Heart Catheterization:     Assessment & Plan:     ICD-10-CM   1. Endobronchial mass  R91.8 Ambulatory referral to Pulmonology    Procedural/ Surgical Case Request: VIDEO BRONCHOSCOPY WITHOUT FLUORO    Novel Coronavirus, NAA ([facility])    2. Second hand smoke exposure  Z77.22       Discussion:  This is a 89-year-old female, secondhand smoke exposure, lifelong non-smoker, atrial fibrillation on Coumadin.  Here today to discuss repeat bronchoscopy with consideration for cryotherapy, tumor debulking and biopsy of the endobronchial masses seen on last bronchoscopy.  Plan: Will need to hold her Coumadin at least 5 days prior to case. Will plan for bronchoscopy and biopsy. Review of the images would suggest that there is potentially necrotic tumor and behind this. Will try to  ensure that there is patency distal to the right lower lobe openings but I explained that only these biopsies would hopefully give us a diagnosis as well as some palliative treatments for opening the distal airway to help prevent obstructive pneumonias. Patient's daughters were present for this discussion. Planned bronchoscopy date will be on 02/07/2023. Patient return to clinic 1 week for postop visit afterwards.    Current Outpatient Medications:    apixaban (ELIQUIS) 5 MG TABS tablet, Take 1 tablet (5 mg total) by mouth 2 (two) times daily., Disp: 60 tablet, Rfl:    ascorbic acid (VITAMIN C) 500 MG tablet, Take 600 mg by mouth daily. , Disp: , Rfl:    Biotin 1000 MCG tablet, Take 1 tablet by mouth daily. , Disp: , Rfl:    Calcium Carbonate-Vitamin D (CALCIUM + D PO), Take by mouth daily., Disp: , Rfl:    Cholecalciferol (VITAMIN D PO), Take by mouth daily., Disp: , Rfl:    diltiazem (CARDIZEM CD) 120 MG 24 hr capsule, Take 120 mg by mouth daily., Disp: , Rfl:    folic acid (FOLVITE) 1 MG tablet, Take 1 tablet (1 mg total) by mouth daily., Disp: , Rfl:    gabapentin (NEURONTIN) 300 MG capsule, Take 300 mg by mouth 2 (two) times daily., Disp: ,   Rfl:    lisinopril (ZESTRIL) 20 MG tablet, Take 1 tablet (20 mg total) by mouth daily., Disp: 90 tablet, Rfl: 2   pantoprazole (PROTONIX) 40 MG tablet, Take 40 mg by mouth daily., Disp: , Rfl:    sertraline (ZOLOFT) 100 MG tablet, Take 1 tablet (100 mg total) by mouth daily., Disp: 90 tablet, Rfl: 0   vitamin A 8000 UNIT capsule, Take 8,000 Units by mouth daily., Disp: , Rfl:    Zinc Picolinate POWD, Take by mouth., Disp: , Rfl:    atorvastatin (LIPITOR) 80 MG tablet, Take 1 tablet (80 mg total) by mouth daily., Disp: 90 tablet, Rfl: 3  I spent 62 minutes dedicated to the care of this patient on the date of this encounter to include pre-visit review of records, face-to-face time with the patient discussing conditions above, post visit ordering of  testing, clinical documentation with the electronic health record, making appropriate referrals as documented, and communicating necessary findings to members of the patients care team.   Fender Herder L Jacon Whetzel, DO [facility] 01/23/2023 1:37 PM    

## 2023-01-23 NOTE — Patient Instructions (Signed)
Thank you for visiting Dr. Valeta Harms at [facility]. Today we recommend the following:  Orders Placed This Encounter  Procedures   Procedural/ Surgical Case Request: [place]   Ambulatory referral to Pulmonology   Bronchoscopy on 02/07/2023 Stop coumadin 5 days prior   Return in about 22 days (around 02/14/2023) for w/ Eric Form, NP .    Please do your part to reduce the spread of COVID-19.

## 2023-01-24 ENCOUNTER — Telehealth: Payer: Self-pay | Admitting: Acute Care

## 2023-01-24 ENCOUNTER — Telehealth: Payer: Self-pay

## 2023-01-24 NOTE — Telephone Encounter (Signed)
[name]   Please advise on message from daughter  Mom is having Surgery on [name] 2st and [employer] is ending. That means her care home will have to bill her.   Daughter would like [place] to fax a letter to [place], the care home she is currently at, to extend her care thru the recovery period and state in that letter the reason is that she will have to have "round the clock care".    Fax # at care home- [phone]  Are you ok with me writing this letter and faxing it to the insurance to see if her care can be extended?  Thank you

## 2023-01-24 NOTE — Progress Notes (Cosign Needed)
I have made 3 attempts to contact pt to schedule a follow up with CPP. Unsuccessful outreach   [name], [place] Pharmacist Assistant  [phone]

## 2023-01-24 NOTE — Telephone Encounter (Signed)
PT daughter calling. Mom is having Surgery on April 2st and [employer] is ending. That means her care home will have to bill her.  Daughter would like [place] to fax a letter to [place], the care home she is currently at, to extend her care thru the recovery period and state in that letter the reason is that she will have to have "round the clock care".  Pls call daughter to advise @ [phone]  Fax # at care home- [phone]

## 2023-01-25 NOTE — Telephone Encounter (Signed)
PT daughter calling again. Hates to "put pressure" on [place] but needs this letter by today. Pls call daughter to advise status. [phone]  Fax number and details woven throughout this encounter. TY.  They may possibly drop her insurance by the 22nd so she is on a time crunch.

## 2023-01-25 NOTE — Telephone Encounter (Signed)
[name] advises Dr. Valeta Harms is doing patients bronchoscopy. Daughter states she needs information on how long will patient be in the hospital after procedure, and also is asking about if patient is able to get long term medicaid- she advises they need proof stating she needs 24hour care.   Sarah since Dr. Valeta Harms is off I'm sending this to you. I seen Your last message. But daughter is insisting our office provides this information

## 2023-01-25 NOTE — Telephone Encounter (Signed)
Spoke with patients daughter advised message has been sent to [facility] and someone would be in touch with her as soon as she responds

## 2023-01-25 NOTE — Telephone Encounter (Signed)
Spoke with patients daughter advised to call pcp in regards to medicaid. Relayed information from [facility]. Nothing further needed.

## 2023-01-25 NOTE — Telephone Encounter (Signed)
Calling again. Daughter says send to Attn: Elliot Gault at [employer]. FAX # below.  Fax # at care home- [phone]

## 2023-02-01 NOTE — Telephone Encounter (Signed)
Called patient left message on personal voice mail to call back. 

## 2023-02-02 ENCOUNTER — Encounter (HOSPITAL_COMMUNITY): Payer: Self-pay | Admitting: Pulmonary Disease

## 2023-02-02 NOTE — Progress Notes (Signed)
Patient resides at [facility], phone [phone]. Will fax instructions for DOS to patient's nurse Judeen Hammans, LPN at [facility] fax # [phone].     Chest x-ray - 01/06/23 (1V) EKG - 01/01/23 Stress Test - n/a ECHO - 06/25/15 Cardiac Cath - 02/03/09 (Dr [name])  ICD Pacemaker/Loop - n/a  Sleep Study -  n/a CPAP - none  Diabetes - n/a  Blood Thinner Instructions: Stop Eliquis 3 days prior to procedure.  Last dose on 02/03/23.     Aspirin Instructions: n/a  NPO  Anesthesia review: Yes  STOP now taking any Aspirin (unless otherwise instructed by your surgeon), Aleve, Naproxen, Ibuprofen, Motrin, Advil, Goody's, BC's, all herbal medications, fish oil, and all vitamins.

## 2023-02-03 ENCOUNTER — Other Ambulatory Visit: Payer: Medicare Other

## 2023-02-03 DIAGNOSIS — R918 Other nonspecific abnormal finding of lung field: Secondary | ICD-10-CM | POA: Diagnosis not present

## 2023-02-03 NOTE — Progress Notes (Addendum)
Surgical Instructions for [name]   Your procedure is scheduled on Tuesday, 02/07/23 at 8:30 AM - 9:15 AM.  Report to [facility] Entrance "A" at 5:30 AM, then check in with the Admitting office.  Call this number if you have problems the morning of surgery:  [phone]   If you have any questions prior to your surgery date call [phone]: Open Monday-Friday 8am-4pm If you experience any cold or flu symptoms such as cough, fever, chills, shortness of breath, etc. between now and your scheduled surgery, please notify us at the above number   Covid test on Day of Surgery.   Remember:  Do not eat or drink after midnight Monday (02/06/23) Take these medicines the morning of surgery with A SIP OF WATER: Atorvastatin Diltiazem Gabapentin Protonix Zoloft  Stop Eliquis three days prior to procedure.  Last dose on 02/03/23.  As of today, STOP taking any Aspirin (unless otherwise instructed by your surgeon) Aleve, Naproxen, Ibuprofen, Motrin, Advil, Goody's, BC's, all herbal medications, fish oil, and all vitamins.           Do not wear jewelry or makeup. Do not wear lotions, powders, perfumes, or deodorant. Do not shave 48 hours prior to surgery.  Do not bring valuables to the hospital. Do not wear nail polish, gel polish, artificial nails, or any other type of covering on natural nails (fingers and toes) If you have artificial nails or gel coating that need to be removed by a nail salon, please have this removed prior to surgery. Artificial nails or gel coating may interfere with anesthesia's ability to adequately monitor your vital signs.  [facility] is not responsible for any belongings or valuables.     Contacts, glasses, hearing aids, dentures or partials may not be worn into surgery, please bring cases for these belongings   Patients discharged the day of surgery will not be allowed to drive home, and someone needs to stay with them for 24 hours.   SURGICAL WAITING  ROOM VISITATION Patients having surgery or a procedure may have no more than 2 support people in the waiting area - these visitors may rotate.   Children under the age of 44 must have an adult with them who is not the patient. If the patient needs to stay at the hospital during part of their recovery, the visitor guidelines for inpatient rooms apply. Pre-op nurse will coordinate an appropriate time for 1 support person to accompany patient in pre-op.  This support person may not rotate.   Please refer to [email or web address] for the visitor guidelines for Inpatients (after your surgery is over and you are in a regular room).    Special instructions:    Oral Hygiene is also important to reduce your risk of infection.  Remember - BRUSH YOUR TEETH THE MORNING OF SURGERY WITH YOUR REGULAR TOOTHPASTE

## 2023-02-05 LAB — NOVEL CORONAVIRUS, NAA: SARS-CoV-2, NAA: NOT DETECTED

## 2023-02-06 ENCOUNTER — Telehealth: Payer: Self-pay | Admitting: Pulmonary Disease

## 2023-02-06 NOTE — Progress Notes (Signed)
Anesthesia Chart Review: SAME DAY WORK-UP  Case: [id number] Date/Time: 02/07/23 0830   Procedure: VIDEO BRONCHOSCOPY WITHOUT FLUORO (Right) - possible cryotherapy   Anesthesia type: General   Diagnosis: Endobronchial mass [R91.8]   Pre-op diagnosis: endobronchial lesion   Location: [facility] / [facility]   Surgeons: Garner Nash, DO       DISCUSSION: Patient is an 89 year old female scheduled for the above procedure.  Admitted to [facility] 01/01/2023 - 01/07/2023 for recurrent falls. CT head, c-spine, and chest were negative for acute trauma but chest CT did show a rounded density in the right bronchus intermedius with complete density bronchial filling of the right lower lobe bronchi and complete right lower lobe collapse.  The acuity was unclear but it favored to be chronic. Pulmonology consulted and recommended a bronchoscopy which was done on 01/06/23. Cytology was non-diagnostic showing atypical cells, but reportedly with visible tumor in the airway, so she was referred to Dr. Valeta Harms for consideration of repeat bronchoscopy and tumor debulking. Imaging also showed new and previously treated vertebral compression fractures without acute back pain or neurologic deciefit, so out-patient follow-up with Dr. Melina Schools recommended. She had been living at home, but SNF recommended and discharged to [facility]    History includes never smoker, hypertension, CAD (1V CAD, small DIAG, medical therapy 2010), LVH, pulmonary hypertension (PA peak pressure 32 mmHg 06/2015 echo), mitral regurgitation (mild-moderate 06/2015 echo), atrial fibrillation, hypercholesterolemia, PVD (occluded left AT, unsuccessful PTA 04/29/20), chronic back pain, osteoarthritis, vertebral compression fractures (s/p kyphoplasty), recurrent falls.  Last visit noted with cardiologist Dr. [name] was on 04/18/22. Afib controlled on diltiazem. Asymptomatic from mild-moderate MR and pulmonary  hypertension (PA peak pressure 32 mmHg) from 2016 echo. BP and HLD controlled on therapy. Known severe stenosis of a small DIAG branch from 2010, continued medical therapy recommended. Left foot ulcer stable, unsuccessful percutaneous intervention in 2021. Six month follow-up planned. Had been on warfarin, but now on Eliquis.  Reportedly advised to stop Eliquis 3 days prior to procedure, last dose on 02/03/23.      She is a same day work-up. Anesthesia team to evaluate on the day of surgery. 02/03/23 COVID-19 test negative.    VS: Ht 5\' 4"  (1.626 m)   Wt 64.4 kg   BMI 24.37 kg/m  BP Readings from Last 3 Encounters:  01/23/23 130/70  01/08/23 122/67  10/04/22 120/68   Pulse Readings from Last 3 Encounters:  01/23/23 94  01/08/23 81  10/04/22 (!) 106     PROVIDERS: [name], MD (Inactive) [name], Peter, MD is cardiologist   LABS: For updated labs on arrival. Most recent labs in [facility] include: Lab Results  Component Value Date   WBC 8.1 01/04/2023   HGB 9.8 (L) 01/04/2023   HCT 32.7 (L) 01/04/2023   PLT 290 01/04/2023   GLUCOSE 90 01/06/2023   ALT 44 01/06/2023   AST 42 (H) 01/06/2023   NA 137 01/06/2023   K 3.0 (L) 01/06/2023   CL 107 01/06/2023   CREATININE 0.63 01/06/2023   BUN 12 01/06/2023   CO2 23 01/06/2023   TSH 2.560 12/09/2021   INR 1.4 (H) 01/08/2023   HGBA1C 6.4 (H) 10/04/2022     IMAGES: 1V CXR 01/06/23: IMPRESSION: 1. No evidence of pneumothorax following bronchoscopy. 2. Unchanged opacity in the right lower chest likely reflecting right lower lobe collapse.  CT Chest/abd/pelvis 01/01/23: IMPRESSION: 1. No evidence of acute traumatic injury to  the chest, abdomen, or pelvis. 2. Reference thoracic and lumbar spine reformats for spinal evaluation. 3. Rounded density in the right bronchus intermedius with complete low-density bronchial filling of the right lower lobe bronchi and complete right lower lobe collapse. Acuity is uncertain but  favored to be chronic. There is a 2.9 cm fluid density structure within the area of atelectatic lung in the right lower lobe. This is nonspecific. Both neoplastic or infectious etiologies are considered. Recommend bronchoscopy for further evaluation. 4. Subpleural reticulation and ground-glass in a peripheral and basilar predominant distribution, suspicious for interstitial lung disease. 5. Incidental findings in the abdomen and pelvis of hepatic steatosis and right renal scarring. Moderate-sized hiatal hernia. - Aortic Atherosclerosis (ICD10-I70.0).     EKG: 01/01/23: Atrial fibrillation at 83 bpm Right axis deviation Low voltage, extremity leads Anteroseptal infarct, old Confirmed by Dene Gentry [phone]) on 01/01/2023 4:47:04 PM   CV: Echo 06/25/15: Study Conclusions  - Left ventricle: There was mild focal basal hypertrophy of the    septum. Systolic function was vigorous. The estimated ejection    fraction was in the range of 65% to 70%. Doppler parameters are    consistent with high ventricular filling pressure.  - Aortic valve: There was mild regurgitation.  - Mitral valve: Moderately calcified annulus. There was mild to    moderate regurgitation directed eccentrically and posteriorly.  - Left atrium: The atrium was moderately dilated.  - Tricuspid valve: There was mild-moderate regurgitation.  - Pulmonary arteries: Systolic pressure was mildly increased. PA    peak pressure: 32 mm Hg (S).  Impressions:  - Compared to the prior study, there has been no significant    interval change.    Cardia cath 02/03/09:  FINAL INTERPRETATION:  1. Single-vessel obstructive coronary artery disease involving very      small diagonal branch.  2. Normal left ventricular function.  3. Severe mitral insufficiency.  4. Severe pulmonary hypertension, did elevated left ventricular      filling pressures of mitral insufficiency.    US Carotid 02/26/08: IMPRESSION:  1. Bilateral  carotid bifurcation plaque without hemodynamically  significant stenosis.  Continued surveillance recommended.    Past Medical History:  Diagnosis Date   Arthritis    Back pain, chronic    Coronary artery disease CARDIOLOGIST- DR [name]--  LOV IN EPIC   Depression    Hammer toe    Hypercholesterolemia    Hypertensive heart disease    WITH LVH   LVH (left ventricular hypertrophy) due to hypertensive disease    Mitral insufficiency    CHRONIC   Mitral regurgitation    Permanent atrial fibrillation (HCC)    Pulmonary hypertension (HCC)    Recurrent falls    Transfusion history     Past Surgical History:  Procedure Laterality Date   ABDOMINAL AORTOGRAM W/LOWER EXTREMITY N/A 04/29/2020   Procedure: ABDOMINAL AORTOGRAM W/LOWER EXTREMITY;  Surgeon: [name], MD;  Location: [facility];  Service: Cardiovascular;  Laterality: N/A;  Lt Leg   ABDOMINAL HYSTERECTOMY     ANTERIOR REPAIR/ [facility] PUBOVAGINAL SLING  05-03-2002   BRONCHIAL BRUSHINGS  01/06/2023   Procedure: BRONCHIAL BRUSHINGS;  Surgeon: Lanier Clam, MD;  Location: [facility];  Service: Endoscopy;;   BRONCHIAL WASHINGS  01/06/2023   Procedure: BRONCHIAL WASHINGS;  Surgeon: Lanier Clam, MD;  Location: [facility];  Service: Endoscopy;;   BUNIONECTOMY WITH HAMMERTOE RECONSTRUCTION Right 03/01/2013   Procedure: RIGHT FOOT EXCISION BUNIONETTE AND FUSION OF DIP FOURTH TOE;  Surgeon: [name], MD;  Location: [facility];  Service: Orthopedics;  Laterality: Right;   CARDIAC CATHETERIZATION  02-03-2009  DR  [name]   SINGLE-VESSEL OBSTRUCTIVE CAD, SMALL DIAGONAL BRANCH/ NORMAL LVF/ SEVERE MITRAL INSUFFICIENCY/ SEVERE PULMONARY HYPERTENSION   CATARACT EXTRACTION W/ INTRAOCULAR LENS  IMPLANT, BILATERAL  2003   COLONOSCOPY  07/25/2014   few mall scattered diverticula in the ectire examined colon. Small internal hemmorhoids.    HAMMER TOE SURGERY  02/28/2012   Procedure: HAMMER TOE  CORRECTION;  Surgeon: [name], MD;  Location: [facility];  Service: Orthopedics;  Laterality: Right;  FUSION OF PROXIMAL INTERPHALANGEAL  RIGHT THIRD CLAW TOE   HEMOSTASIS CONTROL  01/06/2023   Procedure: HEMOSTASIS CONTROL;  Surgeon: Lanier Clam, MD;  Location: [facility];  Service: Endoscopy;;   PARS PLANA VITRECTOMY W/ REPAIR OF MACULAR HOLE  02-04-2002   LEFT EYE   PERIPHERAL VASCULAR BALLOON ANGIOPLASTY  04/29/2020   Procedure: PERIPHERAL VASCULAR BALLOON ANGIOPLASTY;  Surgeon: [name], MD;  Location: [facility];  Service: Cardiovascular;;  Lt. AT   REPAIR RIGHT SECOND CLAW TOE/ VARUS MEDIAL CLOSING WEDGE OSTEOTOMY PROXIMAL PHALANX RIGHT GREAT TOE  05-05-2005   TOTAL HIP ARTHROPLASTY     BILATERAL---  LEFT 4/98;   RIGHT  11/98   VARUS OSTEOTOMY PROXIMAL PHALANX, LEFT GREAT TOE/ PARING DOWN THE CALLUS , SECOND LEFT TOE  10-27-2008   VIDEO BRONCHOSCOPY N/A 01/06/2023   Procedure: VIDEO BRONCHOSCOPY WITHOUT FLUORO;  Surgeon: Lanier Clam, MD;  Location: [facility];  Service: Endoscopy;  Laterality: N/A;    MEDICATIONS: No current facility-administered medications for this encounter.    apixaban (ELIQUIS) 5 MG TABS tablet   ascorbic acid (VITAMIN C) 500 MG tablet   atorvastatin (LIPITOR) 80 MG tablet   Biotin 1000 MCG tablet   Calcium Carbonate-Vitamin D (CALCIUM + D PO)   Cholecalciferol (VITAMIN D PO)   diltiazem (CARDIZEM CD) 120 MG 24 hr capsule   folic acid (FOLVITE) 1 MG tablet   gabapentin (NEURONTIN) 300 MG capsule   lisinopril (ZESTRIL) 20 MG tablet   pantoprazole (PROTONIX) 40 MG tablet   sertraline (ZOLOFT) 100 MG tablet   vitamin A 8000 UNIT capsule   Zinc Picolinate POWD     Myra Gianotti, PA-C Surgical Short Stay/Anesthesiology [facility] Phone [phone] [facility] Phone [phone] 02/06/2023 12:55 PM

## 2023-02-06 NOTE — Telephone Encounter (Signed)
Called patient left message on personal voice mail to call back. 

## 2023-02-06 NOTE — Telephone Encounter (Signed)
ATC x 1 LMTCB 02/06/2023

## 2023-02-06 NOTE — Telephone Encounter (Signed)
I tried to call [name] myself and have left a vm making her aware procedure is definitely still on for tomorrow at 8:30 and pt needs to be at [facility] at 6:00.

## 2023-02-06 NOTE — Telephone Encounter (Signed)
nursing home call in regards of Pt getting covid test on friday and needing the results for her bronch tm but it shows here that the it was cancelled pls advise

## 2023-02-06 NOTE — Anesthesia Preprocedure Evaluation (Signed)
Anesthesia Evaluation  Patient identified by MRN, date of birth, ID band Patient awake    Reviewed: Allergy & Precautions, NPO status , Patient's Chart, lab work & pertinent test results  Airway Mallampati: II       Dental  (+) Missing, Poor Dentition   Pulmonary    Pulmonary exam normal        Cardiovascular hypertension, Pt. on medications Normal cardiovascular exam     Neuro/Psych  PSYCHIATRIC DISORDERS  Depression       GI/Hepatic ,GERD  ,,  Endo/Other  negative endocrine ROS    Renal/GU negative Renal ROS  negative genitourinary   Musculoskeletal   Abdominal Normal abdominal exam  (+)   Peds  Hematology negative hematology ROS (+)   Anesthesia Other Findings   Reproductive/Obstetrics                              Anesthesia Physical Anesthesia Plan  ASA: 3  Anesthesia Plan: General   Post-op Pain Management:    Induction: Intravenous  PONV Risk Score and Plan: 3  Airway Management Planned: Oral ETT  Additional Equipment: None  Intra-op Plan:   Post-operative Plan: Extubation in OR  Informed Consent: I have reviewed the patients History and Physical, chart, labs and discussed the procedure including the risks, benefits and alternatives for the proposed anesthesia with the patient or authorized representative who has indicated his/her understanding and acceptance.     Dental advisory given  Plan Discussed with: CRNA  Anesthesia Plan Comments: (PAT note written 02/06/2023 by Myra Gianotti, PA-C.  )        Anesthesia Quick Evaluation

## 2023-02-07 ENCOUNTER — Encounter (HOSPITAL_COMMUNITY): Payer: Self-pay | Admitting: Pulmonary Disease

## 2023-02-07 ENCOUNTER — Encounter (HOSPITAL_COMMUNITY)
Admission: RE | Disposition: A | Discharge status: Skilled Nursing Facility | Payer: Self-pay | Source: Home / Self Care | Attending: Pulmonary Disease

## 2023-02-07 ENCOUNTER — Ambulatory Visit (HOSPITAL_COMMUNITY)
Admission: RE | Admit: 2023-02-07 | Discharge: 2023-02-07 | Disposition: A | Discharge status: Skilled Nursing Facility | Payer: Medicare Other | Attending: Pulmonary Disease | Admitting: Pulmonary Disease

## 2023-02-07 ENCOUNTER — Ambulatory Visit (HOSPITAL_BASED_OUTPATIENT_CLINIC_OR_DEPARTMENT_OTHER): Payer: Medicare Other | Admitting: Vascular Surgery

## 2023-02-07 ENCOUNTER — Telehealth: Payer: Self-pay | Admitting: Radiation Oncology

## 2023-02-07 ENCOUNTER — Ambulatory Visit (HOSPITAL_COMMUNITY): Payer: Medicare Other | Admitting: Vascular Surgery

## 2023-02-07 ENCOUNTER — Other Ambulatory Visit: Payer: Self-pay

## 2023-02-07 DIAGNOSIS — I4891 Unspecified atrial fibrillation: Secondary | ICD-10-CM | POA: Insufficient documentation

## 2023-02-07 DIAGNOSIS — Z7901 Long term (current) use of anticoagulants: Secondary | ICD-10-CM | POA: Diagnosis not present

## 2023-02-07 DIAGNOSIS — Z79899 Other long term (current) drug therapy: Secondary | ICD-10-CM | POA: Insufficient documentation

## 2023-02-07 DIAGNOSIS — I10 Essential (primary) hypertension: Secondary | ICD-10-CM

## 2023-02-07 DIAGNOSIS — R911 Solitary pulmonary nodule: Secondary | ICD-10-CM | POA: Diagnosis not present

## 2023-02-07 DIAGNOSIS — R918 Other nonspecific abnormal finding of lung field: Secondary | ICD-10-CM | POA: Diagnosis present

## 2023-02-07 DIAGNOSIS — C3431 Malignant neoplasm of lower lobe, right bronchus or lung: Secondary | ICD-10-CM | POA: Diagnosis not present

## 2023-02-07 DIAGNOSIS — F32A Depression, unspecified: Secondary | ICD-10-CM | POA: Diagnosis not present

## 2023-02-07 DIAGNOSIS — C3492 Malignant neoplasm of unspecified part of left bronchus or lung: Secondary | ICD-10-CM | POA: Diagnosis not present

## 2023-02-07 HISTORY — DX: Hyperlipidemia, unspecified: E78.5

## 2023-02-07 HISTORY — PX: VIDEO BRONCHOSCOPY: SHX5072

## 2023-02-07 HISTORY — PX: CRYOTHERAPY: SHX6894

## 2023-02-07 HISTORY — DX: Unspecified macular degeneration: H35.30

## 2023-02-07 HISTORY — PX: HEMOSTASIS CONTROL: SHX6838

## 2023-02-07 HISTORY — DX: Pneumonia, unspecified organism: J18.9

## 2023-02-07 LAB — CBC
HCT: 38.1 % (ref 36.0–46.0)
Hemoglobin: 11 g/dL — ABNORMAL LOW (ref 12.0–15.0)
MCH: 25.8 pg — ABNORMAL LOW (ref 26.0–34.0)
MCHC: 28.9 g/dL — ABNORMAL LOW (ref 30.0–36.0)
MCV: 89.2 fL (ref 80.0–100.0)
Platelets: 367 10*3/uL (ref 150–400)
RBC: 4.27 MIL/uL (ref 3.87–5.11)
RDW: 18.9 % — ABNORMAL HIGH (ref 11.5–15.5)
WBC: 8.4 10*3/uL (ref 4.0–10.5)
nRBC: 0 % (ref 0.0–0.2)

## 2023-02-07 LAB — BASIC METABOLIC PANEL
Anion gap: 12 (ref 5–15)
BUN: 7 mg/dL — ABNORMAL LOW (ref 8–23)
CO2: 26 mmol/L (ref 22–32)
Calcium: 9.4 mg/dL (ref 8.9–10.3)
Chloride: 101 mmol/L (ref 98–111)
Creatinine, Ser: 0.67 mg/dL (ref 0.44–1.00)
GFR, Estimated: >60 mL/min (ref >60)
Glucose, Bld: 104 mg/dL — ABNORMAL HIGH (ref 70–99)
Potassium: 3.1 mmol/L — ABNORMAL LOW (ref 3.5–5.1)
Sodium: 139 mmol/L (ref 135–145)

## 2023-02-07 SURGERY — VIDEO BRONCHOSCOPY WITHOUT FLUORO
Anesthesia: General | Laterality: Right

## 2023-02-07 MED ORDER — ONDANSETRON HCL 4 MG/2ML IJ SOLN
INTRAMUSCULAR | Status: DC | PRN
  Administered 2023-02-07: 4 mg via INTRAVENOUS

## 2023-02-07 MED ORDER — PHENYLEPHRINE 80 MCG/ML (10ML) SYRINGE FOR IV PUSH (FOR BLOOD PRESSURE SUPPORT)
PREFILLED_SYRINGE | INTRAVENOUS | Status: DC | PRN
  Administered 2023-02-07: 180 ug via INTRAVENOUS

## 2023-02-07 MED ORDER — EPINEPHRINE 1 MG/10ML IJ SOSY
PREFILLED_SYRINGE | INTRAMUSCULAR | Status: AC
  Filled 2023-02-07: qty 10

## 2023-02-07 MED ORDER — SODIUM CHLORIDE (PF) 0.9 % IJ SOLN
PREFILLED_SYRINGE | INTRAMUSCULAR | Status: DC | PRN
  Administered 2023-02-07 (×2): 4 mL

## 2023-02-07 MED ORDER — LIDOCAINE 2% (20 MG/ML) 5 ML SYRINGE
INTRAMUSCULAR | Status: DC | PRN
  Administered 2023-02-07: 100 mg via INTRAVENOUS

## 2023-02-07 MED ORDER — PROPOFOL 10 MG/ML IV BOLUS
INTRAVENOUS | Status: DC | PRN
  Administered 2023-02-07: 150 mg via INTRAVENOUS

## 2023-02-07 MED ORDER — SUGAMMADEX SODIUM 200 MG/2ML IV SOLN
INTRAVENOUS | Status: DC | PRN
  Administered 2023-02-07: 200 mg via INTRAVENOUS

## 2023-02-07 MED ORDER — CHLORHEXIDINE GLUCONATE 0.12 % MT SOLN
15.0000 mL | Freq: Once | OROMUCOSAL | Status: DC
  Filled 2023-02-07: qty 15

## 2023-02-07 MED ORDER — FENTANYL CITRATE (PF) 250 MCG/5ML IJ SOLN
INTRAMUSCULAR | Status: DC | PRN
  Administered 2023-02-07: 100 ug via INTRAVENOUS

## 2023-02-07 MED ORDER — PROPOFOL 500 MG/50ML IV EMUL
INTRAVENOUS | Status: DC | PRN
  Administered 2023-02-07: 100 ug/kg/min via INTRAVENOUS

## 2023-02-07 MED ORDER — LACTATED RINGERS IV SOLN: INTRAVENOUS | Status: DC

## 2023-02-07 MED ORDER — ROCURONIUM BROMIDE 10 MG/ML (PF) SYRINGE
PREFILLED_SYRINGE | INTRAVENOUS | Status: DC | PRN
  Administered 2023-02-07: 40 mg via INTRAVENOUS

## 2023-02-07 MED ORDER — PHENYLEPHRINE HCL-NACL 20-0.9 MG/250ML-% IV SOLN
INTRAVENOUS | Status: DC | PRN
  Administered 2023-02-07: 75 ug/min via INTRAVENOUS

## 2023-02-07 NOTE — Anesthesia Postprocedure Evaluation (Signed)
Anesthesia Post Note  Patient: [name]  Procedure(s) Performed: VIDEO BRONCHOSCOPY WITHOUT FLUORO (Right) HEMOSTASIS CONTROL CRYOTHERAPY     Patient location during evaluation: Phase II Anesthesia Type: General Level of consciousness: awake Pain management: pain level controlled Vital Signs Assessment: post-procedure vital signs reviewed and stable Respiratory status: spontaneous breathing Cardiovascular status: stable Postop Assessment: no apparent nausea or vomiting Anesthetic complications: no  There were no known notable events for this encounter.  Last Vitals:  Vitals:   02/07/23 1030 02/07/23 1045  BP: (!) 130/47 (!) 113/57  Pulse: (!) 103 99  Resp: (!) 21 20  Temp:  36.5 C  SpO2: 99% 95%    Last Pain:  Vitals:   02/07/23 0629  TempSrc: Oral                 Huston Foley

## 2023-02-07 NOTE — Transfer of Care (Signed)
Immediate Anesthesia Transfer of Care Note  Patient: [name]  Procedure(s) Performed: VIDEO BRONCHOSCOPY WITHOUT FLUORO (Right) HEMOSTASIS CONTROL CRYOTHERAPY  Patient Location: PACU  Anesthesia Type:General  Level of Consciousness: awake, alert , and oriented  Airway & Oxygen Therapy: Patient connected to face mask oxygen  Post-op Assessment: Report given to RN and Post -op Vital signs reviewed and stable  Post vital signs: Reviewed and stable  Last Vitals:  Vitals Value Taken Time  BP 133/113 02/07/23 0920  Temp    Pulse 113 02/07/23 0924  Resp 15 02/07/23 0924  SpO2 95 % 02/07/23 0924  Vitals shown include unvalidated device data.  Last Pain:  Vitals:   02/07/23 0629  TempSrc: Oral         Complications: There were no known notable events for this encounter.

## 2023-02-07 NOTE — Telephone Encounter (Signed)
4/2 @ 1:53 pm Left voicemail for patient to call our office to be schedule for an consult.

## 2023-02-07 NOTE — Interval H&P Note (Signed)
History and Physical Interval Note:  02/07/2023 7:55 AM  [name]  has presented today for surgery, with the diagnosis of endobronchial lesion.  The various methods of treatment have been discussed with the patient and family. After consideration of risks, benefits and other options for treatment, the patient has consented to  Procedure(s) with comments: VIDEO BRONCHOSCOPY WITHOUT FLUORO (Right) - possible cryotherapy as a surgical intervention.  The patient's history has been reviewed, patient examined, no change in status, stable for surgery.  I have reviewed the patient's chart and labs.  Questions were answered to the patient's satisfaction.     [place]

## 2023-02-07 NOTE — Discharge Instructions (Signed)

## 2023-02-07 NOTE — Anesthesia Procedure Notes (Signed)
Procedure Name: Intubation Date/Time: 02/07/2023 8:27 AM  Performed by: Ester Rink, CRNAPre-anesthesia Checklist: Patient identified, Emergency Drugs available, Suction available and Patient being monitored Patient Re-evaluated:Patient Re-evaluated prior to induction Oxygen Delivery Method: Circle system utilized Preoxygenation: Pre-oxygenation with 100% oxygen Induction Type: IV induction Ventilation: Mask ventilation without difficulty Laryngoscope Size: Miller and 2 Grade View: Grade I Tube type: Oral Tube size: 8.5 mm Number of attempts: 1 Airway Equipment and Method: Stylet and Oral airway Placement Confirmation: ETT inserted through vocal cords under direct vision, positive ETCO2 and breath sounds checked- equal and bilateral Secured at: 21 cm Tube secured with: Tape Dental Injury: Teeth and Oropharynx as per pre-operative assessment

## 2023-02-07 NOTE — Telephone Encounter (Signed)
[name] daughter [name], pt's POA called [facility] after speaking with Dr. Valeta Harms about the bronch pt had performed today 4/2 at [facility] stated she was trying to see if she could get things arranged for pt's radiation treatments.  Told her that Dr. Clabe Seal office had tried reaching out to the pt to get a consult scheduled but she said that pt is probably still not with it to be able to take the phone call and that [name] is the one that is needing to be contacted to get everything arranged for pt.  [name] can be reached at [phone]. Routing to Uvaldo Rising to reach out to pt's daughter.

## 2023-02-07 NOTE — Op Note (Addendum)
Video Bronchoscopy Procedure Note Video Bronchoscopy cryotherapy, tumor debulking, lesional excision  Date of Operation: 02/07/2023  Pre-op Diagnosis: endobronchial mass   Post-op Diagnosis: endobronchial mass   Surgeon: Garner Nash, DO   Assistants: none  Anesthesia: general   Operation: Flexible video fiberoptic bronchoscopy and biopsies.  Estimated Blood Loss: [id number]  Complications: none noted  Indications and History: [name] is 89 y.o. with history of right lower lobe endobronchial lesion.  Recommendation was to perform video fiberoptic bronchoscopy with biopsies. The risks, benefits, complications, treatment options and expected outcomes were discussed with the patient.  The possibilities of pneumothorax, pneumonia, reaction to medication, pulmonary aspiration, perforation of a viscus, bleeding, failure to diagnose a condition and creating a complication requiring transfusion or operation were discussed with the patient who freely signed the consent.    Description of Procedure: The patient was seen in the Preoperative Area, was examined and was deemed appropriate to proceed.  The patient was taken to [facility] endoscopy room 3, identified as [name] and the procedure verified as Flexible Video Fiberoptic Bronchoscopy.  A Time Out was held and the above information confirmed.   Standard therapeutic bronchoscope was inserted into the patient's airway for examination of the right and left lung.  The left lung appears normal, the right lower lobe and bronchus intermedius is fully occluded with tumor.  There was visible tumor growing from the lateral wall of the bronchus intermedius occluding the entire airway.  The 1.7 mm cryotherapy probe was inserted into the patient's airway and used for endobronchial cryobiopsies, cryotherapy and tumor debulking for relief of stenosis and lesional excision from the right bronchus intermedius.  We were able to tunnel through the  tumor onto the other side.  We were able to open up the right middle lobe however the mucosal surface of the opening of the right middle lobe still had tumor infiltration.  As we continued to take a deeper we were able to open up into the right lower lobe opening.  A significant amount of tenacious yellow pus drained from the right lower lobe once we were able to enter that space.  We were not able to identify any normal-appearing anatomy in the right lower lobe except the opening.  The superior segment of the right lower lobe appears to be fully occluded with tumor.  We were able to complete freeze thaw cycles of the right lower lobe and the lining of the wall of the bronchus intermedius to help prevent tumor growth and recurrence into the airway.  There was no significant bleeding following the procedure.  We were able to control hemostasis with 1: 10,000 dilution of epinephrine instillation.  The bronchoscope was brought to just above the main carina there was no evidence of active bleeding from the airway and the scope was removed from the airway.  Samples: 1.  Endobronchial cryobiopsies right lower lobe  Plans:  We will review the cytology, pathology results with the patient when they become available.  Outpatient followup will be with Dr Valeta Harms.    [facility] 02/07/2023 9:18 AM

## 2023-02-08 ENCOUNTER — Other Ambulatory Visit: Payer: Self-pay

## 2023-02-08 ENCOUNTER — Inpatient Hospital Stay: Payer: Medicare Other | Attending: Internal Medicine

## 2023-02-08 ENCOUNTER — Inpatient Hospital Stay: Payer: Medicare Other | Admitting: Internal Medicine

## 2023-02-08 VITALS — BP 112/65 | HR 92 | Temp 98.4°F | Resp 16 | Ht 64.0 in | Wt 113.8 lb

## 2023-02-08 DIAGNOSIS — C3491 Malignant neoplasm of unspecified part of right bronchus or lung: Secondary | ICD-10-CM | POA: Diagnosis not present

## 2023-02-08 DIAGNOSIS — R918 Other nonspecific abnormal finding of lung field: Secondary | ICD-10-CM

## 2023-02-08 DIAGNOSIS — C3490 Malignant neoplasm of unspecified part of unspecified bronchus or lung: Secondary | ICD-10-CM

## 2023-02-08 DIAGNOSIS — C3431 Malignant neoplasm of lower lobe, right bronchus or lung: Secondary | ICD-10-CM

## 2023-02-08 LAB — CBC WITH DIFFERENTIAL (CANCER CENTER ONLY)
Abs Immature Granulocytes: 0.05 10*3/uL (ref 0.00–0.07)
Basophils Absolute: 0 10*3/uL (ref 0.0–0.1)
Basophils Relative: 0 %
Eosinophils Absolute: 0.3 10*3/uL (ref 0.0–0.5)
Eosinophils Relative: 3 %
HCT: 33.8 % — ABNORMAL LOW (ref 36.0–46.0)
Hemoglobin: 10.2 g/dL — ABNORMAL LOW (ref 12.0–15.0)
Immature Granulocytes: 1 %
Lymphocytes Relative: 9 %
Lymphs Abs: 0.9 10*3/uL (ref 0.7–4.0)
MCH: 26.6 pg (ref 26.0–34.0)
MCHC: 30.2 g/dL (ref 30.0–36.0)
MCV: 88 fL (ref 80.0–100.0)
Monocytes Absolute: 0.8 10*3/uL (ref 0.1–1.0)
Monocytes Relative: 8 %
Neutro Abs: 7.9 10*3/uL — ABNORMAL HIGH (ref 1.7–7.7)
Neutrophils Relative %: 79 %
Platelet Count: 416 10*3/uL — ABNORMAL HIGH (ref 150–400)
RBC: 3.84 MIL/uL — ABNORMAL LOW (ref 3.87–5.11)
RDW: 18.6 % — ABNORMAL HIGH (ref 11.5–15.5)
WBC Count: 9.9 10*3/uL (ref 4.0–10.5)
nRBC: 0 % (ref 0.0–0.2)

## 2023-02-08 LAB — CMP (CANCER CENTER ONLY)
ALT: 13 U/L (ref 0–44)
AST: 18 U/L (ref 15–41)
Albumin: 2.8 g/dL — ABNORMAL LOW (ref 3.5–5.0)
Alkaline Phosphatase: 88 U/L (ref 38–126)
Anion gap: 5 (ref 5–15)
BUN: 11 mg/dL (ref 8–23)
CO2: 32 mmol/L (ref 22–32)
Calcium: 9.6 mg/dL (ref 8.9–10.3)
Chloride: 102 mmol/L (ref 98–111)
Creatinine: 0.63 mg/dL (ref 0.44–1.00)
GFR, Estimated: >60 mL/min (ref >60)
Glucose, Bld: 109 mg/dL — ABNORMAL HIGH (ref 70–99)
Potassium: 3.5 mmol/L (ref 3.5–5.1)
Sodium: 139 mmol/L (ref 135–145)
Total Bilirubin: 0.5 mg/dL (ref 0.3–1.2)
Total Protein: 6.9 g/dL (ref 6.5–8.1)

## 2023-02-08 LAB — SURGICAL PATHOLOGY

## 2023-02-08 NOTE — Progress Notes (Signed)
[name] daughter, [name], informs navigator that pt can come to appt @ 2;15 today. [name] requests navigator call [facility] and arrange transportation for her mother, if possible.

## 2023-02-08 NOTE — Progress Notes (Signed)
Navigator reached out to pts daughter, [name], to inform her that transportation cannot be provided by [facility] because of the short notice. [name] understood and states she will be bringing the pt herself.

## 2023-02-08 NOTE — Progress Notes (Signed)
[place] [phone]   [phone]  CONSULT NOTE  REFERRING PHYSICIAN: Dr. Leory Plowman Icard  REASON FOR CONSULTATION:  89 years old white female recently diagnosed with lung cancer  HPI [name] is a 89 y.o. female with past medical history significant for multiple medical problems including osteoarthritis, chronic back pain, coronary artery disease, hypertension, dyslipidemia, macular degeneration, mitral insufficiency as well as atrial fibrillation.  The patient is a never smoker.  She had a fall at the [facility] facility in [place] on January 01, 2023.  She had several imaging studies at that time including CT scan of the chest, abdomen and pelvis that showed a rounded density in the right bronchus intermedius with complete low-density bronchial filling of the right lower lobe bronchi and complete right lower lobe collapse.  There is a 2.9 cm fluid density structure within the area of atelectatic lung in the right lower lobe.  The patient was referred to Dr. Valeta Harms and on February 07, 2023 she underwent video bronchoscopy with cryotherapy and tumor debulking and lesional excision.  The right lower lobe and bronchus intermedius were fully occluded with tumor.  There was a visible tumor growing from the lateral wall of the bronchus intermedius occluding the entire airway.  The final pathology ([id number]) showed moderately differentiated squamous cell carcinoma. The patient was referred to me today for evaluation and recommendation regarding treatment of her condition. When seen today she is feeling fine with no concerning complaints except for the baseline fatigue and weakness in the lower extremities.  She denied having any chest pain, shortness of breath but has mild cough with no hemoptysis.  She has no nausea, vomiting, diarrhea or constipation.  She has no headache or visual changes. Family history significant for mother with heart  disease.  Father had prostate cancer. The patient is a widow and has 4 children.  She was accompanied today by her daughter [name].  She used to work as Web designer.  She has no history of smoking but has exposure to secondhand smoking.  She has no history of alcohol or drug abuse. HPI  Past Medical History:  Diagnosis Date   Arthritis    Back pain, chronic    Coronary artery disease CARDIOLOGIST- DR [name]--  LOV IN EPIC   Depression    Hammer toe    Hypercholesterolemia    Hyperlipidemia    Hypertension    Hypertensive heart disease    WITH LVH   LVH (left ventricular hypertrophy) due to hypertensive disease    Macular degeneration    Mitral insufficiency    CHRONIC   Mitral regurgitation    Permanent atrial fibrillation    Pneumonia    Pulmonary hypertension    Recurrent falls    Transfusion history     Past Surgical History:  Procedure Laterality Date   ABDOMINAL AORTOGRAM W/LOWER EXTREMITY N/A 04/29/2020   Procedure: ABDOMINAL AORTOGRAM W/LOWER EXTREMITY;  Surgeon: [name], MD;  Location: [place] [facility];  Service: Cardiovascular;  Laterality: N/A;  Lt Leg   ABDOMINAL HYSTERECTOMY     ANTERIOR REPAIR/ [facility] PUBOVAGINAL SLING  05-03-2002   BRONCHIAL BRUSHINGS  01/06/2023   Procedure: BRONCHIAL BRUSHINGS;  Surgeon: Lanier Clam, MD;  Location: [facility];  Service: Endoscopy;;   BRONCHIAL WASHINGS  01/06/2023   Procedure: BRONCHIAL WASHINGS;  Surgeon: Lanier Clam, MD;  Location: [facility];  Service: Endoscopy;;   BUNIONECTOMY WITH HAMMERTOE RECONSTRUCTION Right 03/01/2013   Procedure:  RIGHT FOOT EXCISION BUNIONETTE AND FUSION OF DIP FOURTH TOE;  Surgeon: [name], MD;  Location: [facility];  Service: Orthopedics;  Laterality: Right;   CARDIAC CATHETERIZATION  02-03-2009  DR  [name]   SINGLE-VESSEL OBSTRUCTIVE CAD, SMALL DIAGONAL BRANCH/ NORMAL LVF/ SEVERE MITRAL INSUFFICIENCY/ SEVERE PULMONARY HYPERTENSION    CATARACT EXTRACTION W/ INTRAOCULAR LENS  IMPLANT, BILATERAL  2003   COLONOSCOPY  07/25/2014   few mall scattered diverticula in the ectire examined colon. Small internal hemmorhoids.    HAMMER TOE SURGERY  02/28/2012   Procedure: HAMMER TOE CORRECTION;  Surgeon: [name], MD;  Location: [facility];  Service: Orthopedics;  Laterality: Right;  FUSION OF PROXIMAL INTERPHALANGEAL  RIGHT THIRD CLAW TOE   HEMOSTASIS CONTROL  01/06/2023   Procedure: HEMOSTASIS CONTROL;  Surgeon: Lanier Clam, MD;  Location: [facility];  Service: Endoscopy;;   PARS PLANA VITRECTOMY W/ REPAIR OF MACULAR HOLE  02-04-2002   LEFT EYE   PERIPHERAL VASCULAR BALLOON ANGIOPLASTY  04/29/2020   Procedure: PERIPHERAL VASCULAR BALLOON ANGIOPLASTY;  Surgeon: [name], MD;  Location: [place] [facility];  Service: Cardiovascular;;  Lt. AT   REPAIR RIGHT SECOND CLAW TOE/ VARUS MEDIAL CLOSING WEDGE OSTEOTOMY PROXIMAL PHALANX RIGHT GREAT TOE  05-05-2005   TOTAL HIP ARTHROPLASTY     BILATERAL---  LEFT 4/98;   RIGHT  11/98   VARUS OSTEOTOMY PROXIMAL PHALANX, LEFT GREAT TOE/ PARING DOWN THE CALLUS , SECOND LEFT TOE  10-27-2008   VIDEO BRONCHOSCOPY N/A 01/06/2023   Procedure: VIDEO BRONCHOSCOPY WITHOUT FLUORO;  Surgeon: Lanier Clam, MD;  Location: [facility];  Service: Endoscopy;  Laterality: N/A;    Family History  Problem Relation Age of Onset   Heart disease Mother 21   Prostate cancer Father 48    Social History Social History   Tobacco Use   Smoking status: Never   Smokeless tobacco: Never  Vaping Use   Vaping Use: Never used  Substance Use Topics   Alcohol use: Not Currently    Alcohol/week: 1.0 standard drink of alcohol    Types: 1 Glasses of wine per week    Comment: occasional   Drug use: Not Currently    Allergies  Allergen Reactions   Sulfamethoxazole    Sulfanilamide     unkn Other reaction(s): Other (See Comments) unkn   Sulfur Dioxide     Other  reaction(s): Other (See Comments)   Penicillins Rash    Current Outpatient Medications  Medication Sig Dispense Refill   apixaban (ELIQUIS) 5 MG TABS tablet Take 1 tablet (5 mg total) by mouth 2 (two) times daily. 60 tablet    ASCORBIC ACID PO Take 600 mg by mouth daily.     atorvastatin (LIPITOR) 80 MG tablet Take 1 tablet (80 mg total) by mouth daily. 90 tablet 3   Biotin 1000 MCG tablet Take 1,000 mcg by mouth daily.     Calcium Carbonate-Vitamin D (CALCIUM + D PO) Take 1 tablet by mouth daily.     diltiazem (CARDIZEM CD) 120 MG 24 hr capsule Take 120 mg by mouth daily.     gabapentin (NEURONTIN) 300 MG capsule Take 300 mg by mouth 2 (two) times daily.     lisinopril (ZESTRIL) 20 MG tablet Take 1 tablet (20 mg total) by mouth daily. 90 tablet 2   loperamide (IMODIUM A-D) 2 MG tablet Take 2 mg by mouth every 6 (six) hours as needed for diarrhea or loose stools.  pantoprazole (PROTONIX) 40 MG tablet Take 40 mg by mouth daily.     Propylene Glycol-Glycerin (SOOTHE) 0.6-0.6 % SOLN Place 2 drops into both eyes daily as needed (Dry eye).     sertraline (ZOLOFT) 100 MG tablet Take 1 tablet (100 mg total) by mouth daily. 90 tablet 0   vitamin A 8000 UNIT capsule Take 8,000 Units by mouth daily.     No current facility-administered medications for this visit.    Review of Systems  Constitutional: positive for fatigue Eyes: negative Ears, nose, mouth, throat, and face: negative Respiratory: negative Cardiovascular: negative Gastrointestinal: negative Genitourinary:negative Integument/breast: negative Hematologic/lymphatic: negative Musculoskeletal:positive for muscle weakness Neurological: negative Behavioral/Psych: negative Endocrine: negative Allergic/Immunologic: negative  Physical Exam  [id number], healthy, no distress, well nourished, and well developed SKIN: skin color, texture, turgor are normal, no rashes or significant lesions HEAD: Normocephalic, No masses, lesions,  tenderness or abnormalities EYES: normal, PERRLA, Conjunctiva are pink and non-injected EARS: External ears normal, Canals clear OROPHARYNX:no exudate, no erythema, and lips, buccal mucosa, and tongue normal  NECK: supple, no adenopathy, no JVD LYMPH:  no palpable lymphadenopathy, no hepatosplenomegaly BREAST:not examined LUNGS: clear to auscultation , and palpation HEART: regular rate & rhythm, no murmurs, and no gallops ABDOMEN:abdomen soft, non-tender, normal bowel sounds, and no masses or organomegaly BACK: Back symmetric, no curvature., No CVA tenderness EXTREMITIES:no joint deformities, effusion, or inflammation, no edema  NEURO: alert & oriented x 3 with fluent speech, no focal motor/sensory deficits  PERFORMANCE STATUS: ECOG 1  LABORATORY DATA: Lab Results  Component Value Date   WBC 9.9 02/08/2023   HGB 10.2 (L) 02/08/2023   HCT 33.8 (L) 02/08/2023   MCV 88.0 02/08/2023   PLT 416 (H) 02/08/2023      Chemistry      Component Value Date/Time   NA 139 02/08/2023 1323   NA 141 10/04/2022 1140   K 3.5 02/08/2023 1323   CL 102 02/08/2023 1323   CO2 32 02/08/2023 1323   BUN 11 02/08/2023 1323   BUN 16 10/04/2022 1140   CREATININE 0.63 02/08/2023 1323   CREATININE 1.09 (H) 08/25/2016 1132      Component Value Date/Time   CALCIUM 9.6 02/08/2023 1323   ALKPHOS 88 02/08/2023 1323   AST 18 02/08/2023 1323   ALT 13 02/08/2023 1323   BILITOT 0.5 02/08/2023 1323       RADIOGRAPHIC STUDIES: No results found.  ASSESSMENT: This is a very pleasant 89 years old white female with likely stage IIb (T3, N0, M0) non-small cell lung cancer, squamous cell carcinoma presented with central obstructive right middle/right lower lobe lung mass occluding the bronchus intermedius as well as the bronchus to the right lower lobe diagnosed in April 2024.   PLAN: I had a lengthy discussion with the patient and her daughter today about her current disease stage, prognosis and treatment  options. I personally and independently reviewed the scan images and discussed results with the patient and her daughter. The patient has multiple medical problems and with her age she will not be a good candidate for surgical resection of the centrally located tumor. I recommended for her to complete the staging workup by ordering a PET scan to rule out any other metastatic disease and for also further delineation of the central lung mass. She is referred to Dr. Sondra Come with radiation oncology and expected to see him next week. I will arrange for the patient to have a follow-up appointment with me in around 4 months for restaging  CT scan of the chest after her treatment. The patient was advised to call immediately if she has any other concerning symptoms in the interval. The patient voices understanding of current disease status and treatment options and is in agreement with the current care plan.  All questions were answered. The patient knows to call the clinic with any problems, questions or concerns. We can certainly see the patient much sooner if necessary.  Thank you so much for allowing me to participate in the care of [facility]. I will continue to follow up the patient with you and assist in her care.  The total time spent in the appointment was 60 minutes.  Disclaimer: This note was dictated with voice recognition software. Similar sounding words can inadvertently be transcribed and may not be corrected upon review.   Eilleen Kempf February 08, 2023, 2:34 PM

## 2023-02-08 NOTE — Progress Notes (Signed)
Seen today in clinic by Dr. Julien Nordmann   I tried calling earlier today with no response. I suspect they were at the appt.   Thanks,  BLI  Garner Nash, DO [facility] 02/08/2023 5:48 PM

## 2023-02-08 NOTE — Telephone Encounter (Signed)
Patient never returned call  

## 2023-02-08 NOTE — Progress Notes (Signed)
Navigator called pt's daughter, [name], per note from Pulmonology that pt's daughter is the better contact to arrange appointments. Navigator made self introduction and explained role of navigator in cancer care spectrum.  Navigator offered [name] an appt for the pt today at 1:45 for labs and 2:15 for an appt with Dr.Mohamed. [name] states pt may not be able to come, but to check with the pt first. Pt is currently living at the [facility] [phone]). [name] states that the facility would need to provide her a ride to the appt as pt has limited mobility. Navigator encouraged [name] to be able to be there for the appointment. [name] provided navigator with number to pts room at [facility] [phone]). Navigator provided [name] with direct contact information.  Navigator spoke to the pt and made self introduction. Navigator offered the pt the afternoon appt with Dr.Mohamed, however pt declined at this time. Navigator told patient she will call [name] back to let her know she does not want to come today.  Navigator called [name] back to let her know the patient is not interested in coming today. Navigator also let [name] know the clinic has a morning and afternoon appointment available tomorrow, 4/4, if the pt is able to come then and she could accompany the patient. [name] tells navigator she will talk to the patient and call navigator back.

## 2023-02-08 NOTE — Progress Notes (Signed)
Navigator met pt today for first time at her medical oncology consult with Dr.Mohamed. Pt is accompanied by her daughter, [name].  Plan for the pt will likely be radiation. Pt has a consult with Radiation Oncology on 4/9. Pt needs a PET scan to r/o mets. Navigator will arrange.  Navigator aware that the is currently residing at [facility] in [place]. Pt relies predominantly on [name] for transportation, otherwise she requires [facility] transportation services. Navigator informed pt and karen that the [facility] has transportation services and can make those arrangements with our transportation coordinator, Christian. Pt and [name] verbalize understanding of the plan of care and deny questions at this time. [name] has navigators contact information and is encouraged to call with any questions or concerns.

## 2023-02-08 NOTE — Progress Notes (Signed)
Labs prior to new pt appt

## 2023-02-08 NOTE — Progress Notes (Signed)
Navigator called [facility] [phone]) to arrange transportation for the pt. Navigator spoke to [place], Solicitor. Caryl Pina informs Navigator that the facility needs a weeks advanced notice to arrange for transportation. Navigator verbalizes understanding.

## 2023-02-09 NOTE — Progress Notes (Signed)
Navigator arranged pt's PET Scan to be done at [facility] in [place] on 4/17 @ 10:00 with an arrival time of 9:30. [name] daughter, Silvano Bilis. No answer. Left VM with appointment details and a request that [name] call back to confirm she has received message. Also reached out via home number listed in pt demographics. No answer. Left VM with contact information requesting a call back.

## 2023-02-10 ENCOUNTER — Encounter (HOSPITAL_COMMUNITY): Payer: Self-pay | Admitting: Pulmonary Disease

## 2023-02-14 ENCOUNTER — Telehealth: Payer: Self-pay | Admitting: Radiation Oncology

## 2023-02-14 ENCOUNTER — Ambulatory Visit: Payer: Medicare Other

## 2023-02-14 ENCOUNTER — Ambulatory Visit
Admission: RE | Admit: 2023-02-14 | Discharge: 2023-02-14 | Disposition: A | Discharge status: Home / Self Care | Payer: Medicare Other | Source: Ambulatory Visit | Attending: Radiation Oncology | Admitting: Radiation Oncology

## 2023-02-14 ENCOUNTER — Encounter: Payer: Self-pay | Admitting: Acute Care

## 2023-02-14 ENCOUNTER — Ambulatory Visit (INDEPENDENT_AMBULATORY_CARE_PROVIDER_SITE_OTHER): Payer: Medicare Other | Admitting: Acute Care

## 2023-02-14 VITALS — BP 128/74 | HR 67 | Ht 64.0 in | Wt 112.0 lb

## 2023-02-14 DIAGNOSIS — R051 Acute cough: Secondary | ICD-10-CM

## 2023-02-14 DIAGNOSIS — C3431 Malignant neoplasm of lower lobe, right bronchus or lung: Secondary | ICD-10-CM

## 2023-02-14 DIAGNOSIS — C342 Malignant neoplasm of middle lobe, bronchus or lung: Secondary | ICD-10-CM | POA: Diagnosis not present

## 2023-02-14 MED ORDER — DOXYCYCLINE HYCLATE 100 MG PO TABS
100.0000 mg | ORAL_TABLET | Freq: Two times a day (BID) | ORAL | 0 refills | Status: AC
Start: 2023-02-14 — End: ?

## 2023-02-14 NOTE — Patient Instructions (Addendum)
It is good to see your today. Your biopsy showed there is cancer in your lung. Dr. Arbutus Ped has seen you and he recommends a PET scan which has been ordered and is scheduled for 03/01/2023.  This will help to stage the cancer, and see if it has spread anywhere else.  You are scheduled on 02/27/2023 to see Dr. Roselind Messier for radiation to the lung mass . I have send in a prescription for Doxycycline 100 mg twice daily for 1 week for your cough. Please take until gone . Please eat Activia yogurt while on antibiotics  at least one time a day.  If you are in the sun, please wear sun block.  Consider adding Ensure of Boost for calories.  Follow up with Dr. Arbutus Ped in 4 months as is scheduled.  Follow up as needed.  Call us if you need [place].  Please contact office for sooner follow up if symptoms do not improve or worsen or seek emergency care

## 2023-02-14 NOTE — Progress Notes (Signed)
History of Present Illness [name] is a 89 y.o. female never smoker with past medical history significant for multiple medical problems including osteoarthritis, chronic back pain, coronary artery disease, hypertension, dyslipidemia, macular degeneration, mitral insufficiency as well as atrial fibrillation. She was referred to Dr. Tonia BroomsIcard for evaluation of a lung nodule.  Synopsis [name] is a 89 y.o. female never smoker ( but second hand smoke exposure)  with past medical history significant for multiple medical problems including osteoarthritis, chronic back pain, coronary artery disease, hypertension, dyslipidemia, macular degeneration, mitral insufficiency as well as atrial fibrillation.  The patient is a never smoker.  She had a fall at the [facility] in [place] on January 01, 2023.  She had several imaging studies at that time including CT scan of the chest, abdomen and pelvis that showed a rounded density in the right bronchus intermedius with complete low-density bronchial filling of the right lower lobe bronchi and complete right lower lobe collapse.  There is a 2.9 cm fluid density structure within the area of atelectatic lung in the right lower lobe.  The patient was referred to Dr. Tonia BroomsIcard and on February 07, 2023 she underwent video bronchoscopy with cryotherapy and tumor debulking and lesional excision.  The right lower lobe and bronchus intermedius were fully occluded with tumor.  There was a visible tumor growing from the lateral wall of the bronchus intermedius occluding the entire airway.  The final pathology ([id number]) showed moderately differentiated squamous cell carcinoma. Likely stage IIb (T3, N0, M0) .   02/14/2023 Pt. Presents for follow up after bronchoscopy and biopsies of a right lower lobe lung mass.  She underwent Flexible video fiberoptic bronchoscopy and biopsies as well as debulking of the tumor that was occluding her right middle and  right lower lobes. (See OP Note dated 02/07/2023) . Cytology was positive for squamous cell carcinoma She has seen Dr. Arbutus PedMohamed, and he has ordered a PET scan for staging, This has been scheduled for 03/01/2023, she has also been referred to Dr. Roselind MessierKinard for radiation therapy . First treatment is scheduled for  02/27/2023.Due to her age and her multiple medical problems, she is not a candidate for surgery. She will follow up with Dr. Arbutus PedMohamed in 4 months for a restaging CT Chest to evaluate treatment response.    Pt. A very sweet elderly female in a wheelchair. Pt. States she has done well post procedure. She was confused and throught that Dr. Tonia BroomsIcard had removed the tumor with the bronchoscopy. I explained he did de-bulk the tumor, and as a result he was able to open up her right lower lobe, but that the cancer  is not gone. She states she had minimal bleeding post procedure, and that she has developed a cough since the procedure. She denies  any discolored secretions or fever. No shortness of breath or change in her breathing. She states she is just coughing up saliva. We discussed starting her on an antibiotic to make sure the cough does not develop into an infection. She is in agreement with this plan. She has follow up with Dr. Arbutus PedMohamed and  Dr. Roselind MessierKinard scheduled. I have asked her to call and let us know if her cough does not improve after the antibiotic. She verbalized understanding.    Test Results: Cytology 02/07/2023 A. LUNG, RIGHT LOWER LOBE, ENDOBRONCHIAL BIOPSY:  -  Moderately differentiated squamous cell carcinoma.        Latest Ref Rng & Units 02/08/2023  1:23 PM 02/07/2023    7:23 AM 01/04/2023    5:49 AM  CBC  WBC 4.0 - 10.5 K/uL 9.9  8.4  8.1   Hemoglobin 12.0 - 15.0 g/dL 91.4  78.2  9.8   Hematocrit 36.0 - 46.0 % 33.8  38.1  32.7   Platelets 150 - 400 K/uL 416  367  290        Latest Ref Rng & Units 02/08/2023    1:23 PM 02/07/2023    7:23 AM 01/06/2023    4:21 AM  BMP  Glucose 70 - 99 mg/dL  956  213  90   BUN 8 - 23 mg/dL Creatinine 0.44 - 1.00 mg/dL 0.86  5.78  4.69   Sodium 135 - 145 mmol/L 139  139  137   Potassium 3.5 - 5.1 mmol/L 3.5  3.1  3.0   Chloride 98 - 111 mmol/L 102  101  107   CO2 22 - 32 mmol/L 32  26  23   Calcium 8.9 - 10.3 mg/dL 9.6  9.4  9.0     BNP No results found for: "BNP"  ProBNP    Component Value Date/Time   PROBNP 665 05/19/2020 0952    PFT No results found for: "FEV1PRE", "FEV1POST", "FVCPRE", "FVCPOST", "TLC", "DLCOUNC", "PREFEV1FVCRT", "PSTFEV1FVCRT"  No results found.   Past medical hx Past Medical History:  Diagnosis Date   Arthritis    Back pain, chronic    Coronary artery disease CARDIOLOGIST- DR [place]--  LOV IN EPIC   Depression    Hammer toe    Hypercholesterolemia    Hyperlipidemia    Hypertension    Hypertensive heart disease    WITH LVH   LVH (left ventricular hypertrophy) due to hypertensive disease    Macular degeneration    Mitral insufficiency    CHRONIC   Mitral regurgitation    Permanent atrial fibrillation    Pneumonia    Pulmonary hypertension    Recurrent falls    Transfusion history      Social History   Tobacco Use   Smoking status: Never   Smokeless tobacco: Never  Vaping Use   Vaping Use: Never used  Substance Use Topics   Alcohol use: Not Currently    Alcohol/week: 1.0 standard drink of alcohol    Types: 1 Glasses of wine per week    Comment: occasional   Drug use: Not Currently    Ms.Folta reports that she has never smoked. She has never used smokeless tobacco. She reports that she does not currently use alcohol after a past usage of about 1.0 standard drink of alcohol per week. She reports that she does not currently use drugs.  Tobacco Cessation: Never smoker, but significant second hand smoke exposure, and she did have a basement in her home in [place]   Past surgical hx, Family hx, Social hx all reviewed.  Current Outpatient Medications on File Prior to  Visit  Medication Sig   apixaban (ELIQUIS) 5 MG TABS tablet Take 1 tablet (5 mg total) by mouth 2 (two) times daily.   ASCORBIC ACID PO Take 600 mg by mouth daily.   Biotin 1000 MCG tablet Take 1,000 mcg by mouth daily.   Calcium Carbonate-Vitamin D (CALCIUM + D PO) Take 1 tablet by mouth daily.   diltiazem (CARDIZEM CD) 120 MG 24 hr capsule Take 120 mg by mouth daily.   folic acid (FOLVITE) 1 MG tablet Take 1 mg  by mouth daily.   gabapentin (NEURONTIN) 300 MG capsule Take 300 mg by mouth 2 (two) times daily.   lisinopril (ZESTRIL) 20 MG tablet Take 1 tablet (20 mg total) by mouth daily.   loperamide (IMODIUM A-D) 2 MG tablet Take 2 mg by mouth every 6 (six) hours as needed for diarrhea or loose stools.   pantoprazole (PROTONIX) 40 MG tablet Take 40 mg by mouth daily.   Propylene Glycol-Glycerin (SOOTHE) 0.6-0.6 % SOLN Place 2 drops into both eyes daily as needed (Dry eye).   sertraline (ZOLOFT) 100 MG tablet Take 1 tablet (100 mg total) by mouth daily.   vitamin A 8000 UNIT capsule Take 8,000 Units by mouth daily.   atorvastatin (LIPITOR) 80 MG tablet Take 1 tablet (80 mg total) by mouth daily.   No current facility-administered medications on file prior to visit.     Allergies  Allergen Reactions   Sulfamethoxazole    Sulfanilamide     unkn Other reaction(s): Other (See Comments) unkn   Sulfur Dioxide     Other reaction(s): Other (See Comments)   Penicillins Rash    Review Of Systems:  Constitutional:   +  weight loss, No night sweats,  Fevers, chills, fatigue, or  lassitude.  HEENT:   No headaches,  Difficulty swallowing,  Tooth/dental problems, or  Sore throat,                No sneezing, itching, ear ache, nasal congestion, post nasal drip,   CV:  No chest pain,  Orthopnea, PND, swelling in lower extremities, anasarca, dizziness, palpitations, syncope.   GI  No heartburn, indigestion, abdominal pain, nausea, vomiting, diarrhea, change in bowel habits, loss of appetite,  bloody stools.   Resp: +shortness of breath with exertion, none at rest.  + excess mucus, + productive cough,  No non-productive cough,  No coughing up of blood.  No change in color of mucus.  No wheezing.  No chest wall deformity  Skin: no rash or lesions.  GU: no dysuria, change in color of urine, no urgency or frequency.  No flank pain, no hematuria   MS:  No joint pain or swelling.  + decreased range of motion.  No back pain.  Psych:  No change in mood or affect. No depression or anxiety.  No memory loss.   Vital Signs BP 128/74   Pulse 67   Ht 5\' 4"  (1.626 m)   Wt 112 lb (50.8 kg)   SpO2 95% Comment: on RA  BMI 19.22 kg/m    Physical Exam:  General- No distress,  A&Ox3, pleasant ENT: No sinus tenderness, TM clear, pale nasal mucosa, no oral exudate,no post nasal drip, no LAN Cardiac: S1, S2, regular rate and rhythm, no murmur Chest: No wheeze/ rales/ dullness; no accessory muscle use, no nasal flaring, no sternal retractions, few rhonchi Abd.: Soft Non-tender, Non-distended , BS +, Body mass index is 19.22 kg/m.  Ext: No clubbing cyanosis, edema Neuro:  normal strength, MAE x 4, A&O x 3, pleasant Skin: No rashes, warm and dry, no lesions  Psych: normal mood and behavior   Assessment/Plan New Diagnosis Moderately differentiated squamous cell carcinoma.  New cough since procedure 02/07/2023 Plan Your biopsy showed there is cancer in your lung. Dr. Arbutus Ped has seen you and he recommends a PET scan which has been ordered and is scheduled for 03/01/2023.  This will help to stage the cancer, and see if it has spread anywhere else.  You are scheduled on  02/27/2023 to see Dr. Roselind Messier for radiation to the lung mass . I have send in a prescription for Doxycycline 100 mg twice daily for 1 week for your cough. Please take until gone . Please eat Activia yogurt while on antibiotics  at least one time a day.  If you are in the sun, please wear sun block.  Follow up with Dr. Arbutus Ped  in 4 months as is scheduled.  Follow up as needed.  Call us if you need [place], or if you do not get better after antibiotics.  Please contact office for sooner follow up if symptoms do not improve or worsen or seek emergency care    Weight loss Plan Consider adding Ensure of Boost for calories.    I spent 35 minutes dedicated to the care of this patient on the date of this encounter to include pre-visit review of records, face-to-face time with the patient discussing conditions above, post visit ordering of testing, clinical documentation with the electronic health record, making appropriate referrals as documented, and communicating necessary information to the patient's healthcare team.    Bevelyn Ngo, NP 02/14/2023  11:50 AM

## 2023-02-14 NOTE — Telephone Encounter (Signed)
Patient's nursing home called to reschedule today's appointment due to them not aware of appointment.

## 2023-02-16 ENCOUNTER — Encounter: Payer: Self-pay | Admitting: *Deleted

## 2023-02-16 ENCOUNTER — Other Ambulatory Visit: Payer: Self-pay | Admitting: *Deleted

## 2023-02-16 ENCOUNTER — Other Ambulatory Visit: Payer: Self-pay | Admitting: Medical Oncology

## 2023-02-16 DIAGNOSIS — M4850XA Collapsed vertebra, not elsewhere classified, site unspecified, initial encounter for fracture: Secondary | ICD-10-CM | POA: Diagnosis not present

## 2023-02-16 DIAGNOSIS — J449 Chronic obstructive pulmonary disease, unspecified: Secondary | ICD-10-CM | POA: Diagnosis not present

## 2023-02-16 DIAGNOSIS — C3490 Malignant neoplasm of unspecified part of unspecified bronchus or lung: Secondary | ICD-10-CM

## 2023-02-16 DIAGNOSIS — I4821 Permanent atrial fibrillation: Secondary | ICD-10-CM | POA: Diagnosis not present

## 2023-02-16 NOTE — Progress Notes (Signed)
PET scan order faxed to [facility]

## 2023-02-16 NOTE — Progress Notes (Signed)
The proposed treatment discussed in conference is for discussion purpose only and is not a binding recommendation.  The patients have not been physically examined, or presented with their treatment options.  Therefore, final treatment plans cannot be decided.  

## 2023-02-16 NOTE — Progress Notes (Signed)
Spoke with [name] in Pathology to confirm that molecular studies and PD-L1 have been requested.

## 2023-02-17 NOTE — Progress Notes (Signed)
Navigator called pt's daughter, Clydie Braun, to inquire as to why the pt was not able to make it to her Rad Onc appt on 4/9. Clydie Braun states she will not be able to provide transportation for her mom is she wants to receive her treatment in [place]. Clydie Braun states that [facility] will have to provide transportation for the pt. Navigator offered potentially getting the pt treatment at [facility], which is much closer. Clydie Braun insists that her mother will only get treatment in [place].  Navigator calls the pts room number [phone]). Navigator offered the possibility of receiving her radiation at [place] as it is much closer to her. Pt states navigator should talk to karen as "she handles all that paperwork". Navigator explained to the pt that karen had already been spoken to and suggested the navigator speak to the pt. Pt requests she continue to receive her care in [place].  Navigator called [facility] [phone]) to arrange transportation. Presenter, broadcasting, Morrie Sheldon, not available. VM left with navigator contact information requesting a call back to arrange a ride for the pt. Date and time of appt included in message

## 2023-02-19 ENCOUNTER — Other Ambulatory Visit: Payer: Self-pay | Admitting: Cardiology

## 2023-02-19 ENCOUNTER — Other Ambulatory Visit: Payer: Self-pay | Admitting: Family Medicine

## 2023-02-19 DIAGNOSIS — F324 Major depressive disorder, single episode, in partial remission: Secondary | ICD-10-CM

## 2023-02-19 DIAGNOSIS — I4811 Longstanding persistent atrial fibrillation: Secondary | ICD-10-CM

## 2023-02-20 NOTE — Progress Notes (Signed)
Navigator reached out to [employer], Morrie Sheldon, at [facility] [phone]) to arrange transportation to pt's Radiation Oncology appts on 4/22. Morrie Sheldon states that the transportation appt has already been arranged.  Patient had called the NN and left a VM requesting a return call. NN called the pt who has questions about her appointments. NN informed the pt that her radiation oncology appt has been rescheduled for 4/22 and [facility] is aware and will be able to transport her there. Pt states she thought she had a scans to do as well. NN checked pt's chart and saw that the PET scan for 4/24 had been cancelled. NN informed the pt that her scan will be rescheduled once the reason for cancellation is discovered. Pt verbalized understanding. No additional questions at this time.

## 2023-02-21 NOTE — Progress Notes (Signed)
NN received a VM from the pt's daughter, Clydie Braun, stating that she will be able to take the pt to her PET scan at [place]. NN called Clydie Braun back to confirm that the message was received. NN confirmed that Clydie Braun is aware that the PET scan is taking place at [facility] at 9:30.  NN called the pt to confirm that she is aware that Clydie Braun is taking her to her appointment. Pt states she is aware, as Clydie Braun had contacted her this morning.  Radiation Oncology notified pt will have her PET scan prior to her appt on 4/22.

## 2023-02-22 NOTE — Progress Notes (Addendum)
NN received a phone message from [facility] scheduling informing the NN that the patient's PET scan had to be cancelled because of a machine malfunction. NN called central scheduling and was able to secure an appt on 4/23 at 12:40.  NN called [facility] [phone]) to arrange transportation for the appt, however coordinator was not available. NN left VM with a request for a call back to make arrangements.  NN also called the pt's daughter, Clydie Braun, asking if she could take her to the PET scan. No answer. NN left VM asking daughter to call her back.

## 2023-02-22 NOTE — Progress Notes (Addendum)
NN reached out again to [facility] to hopefully arrange transportation for the pt to her PET scan on 4/23. Transportation coordinator, Morrie Sheldon, verbalizes that they can get her to her PET scan on 4/23.  NN reached out to pts daughter, Clydie Braun, to let her know that [facility] was able to provide transportation for the pt to her PET scan. No answer. NN left detailed VM and encouraged Clydie Braun call the NN with any questions or concerns.  NN contacted the pt to inform her that her PET scan was able to be rescheduled for 4/23 at 12:40 and that [facility] was able to provider her transportation for that appt.  Pt verbalized understanding of information given. NN encouraged the pt to call with any concerns or questions.

## 2023-02-23 ENCOUNTER — Telehealth: Payer: Self-pay | Admitting: Radiation Oncology

## 2023-02-23 NOTE — Telephone Encounter (Signed)
Left message for patient's nursing home to call back to reschedule upcoming appointment due to scan date needed before visit.

## 2023-02-24 ENCOUNTER — Encounter (HOSPITAL_COMMUNITY): Payer: Self-pay

## 2023-02-24 DIAGNOSIS — C3492 Malignant neoplasm of unspecified part of left bronchus or lung: Secondary | ICD-10-CM | POA: Diagnosis not present

## 2023-02-27 ENCOUNTER — Ambulatory Visit: Payer: Medicare Other | Admitting: Radiation Oncology

## 2023-02-27 ENCOUNTER — Encounter (HOSPITAL_COMMUNITY): Payer: Self-pay

## 2023-02-27 ENCOUNTER — Ambulatory Visit: Payer: Medicare Other

## 2023-02-28 ENCOUNTER — Encounter (HOSPITAL_COMMUNITY)
Admission: RE | Admit: 2023-02-28 | Discharge: 2023-02-28 | Disposition: A | Discharge status: Home / Self Care | Payer: Medicare Other | Source: Ambulatory Visit | Attending: Internal Medicine | Admitting: Internal Medicine

## 2023-02-28 DIAGNOSIS — R918 Other nonspecific abnormal finding of lung field: Secondary | ICD-10-CM | POA: Diagnosis not present

## 2023-02-28 DIAGNOSIS — C3490 Malignant neoplasm of unspecified part of unspecified bronchus or lung: Secondary | ICD-10-CM | POA: Diagnosis not present

## 2023-02-28 LAB — GLUCOSE, CAPILLARY: Glucose-Capillary: 103 mg/dL — ABNORMAL HIGH (ref 70–99)

## 2023-02-28 MED ORDER — FLUDEOXYGLUCOSE F - 18 (FDG) INJECTION
5.5700 | Freq: Once | INTRAVENOUS | Status: AC | PRN
  Administered 2023-02-28: 5.57 via INTRAVENOUS

## 2023-03-01 ENCOUNTER — Other Ambulatory Visit (HOSPITAL_COMMUNITY): Payer: Medicare Other

## 2023-03-01 ENCOUNTER — Ambulatory Visit: Payer: Medicare Other

## 2023-03-01 ENCOUNTER — Ambulatory Visit
Admission: RE | Admit: 2023-03-01 | Discharge: 2023-03-01 | Disposition: A | Discharge status: Home / Self Care | Payer: Medicare Other | Source: Ambulatory Visit | Attending: Radiation Oncology | Admitting: Radiation Oncology

## 2023-03-01 ENCOUNTER — Telehealth: Payer: Self-pay | Admitting: Radiation Oncology

## 2023-03-01 NOTE — Telephone Encounter (Signed)
Left message with nursing home to call back to reschedule today's missed appointment.

## 2023-03-02 ENCOUNTER — Telehealth: Payer: Self-pay | Admitting: Radiation Oncology

## 2023-03-02 NOTE — Telephone Encounter (Signed)
4/25 @ 8:46 am Called and left voicemail with Morrie Sheldon [facility]) to see about getting patient reschedule for consult with Dr. Roselind Messier.  Waiting on call back to confirm about date 5/7 if works for patient.

## 2023-03-03 ENCOUNTER — Other Ambulatory Visit: Payer: Self-pay | Admitting: Cardiology

## 2023-03-03 DIAGNOSIS — I4811 Longstanding persistent atrial fibrillation: Secondary | ICD-10-CM

## 2023-03-03 NOTE — Telephone Encounter (Signed)
Warfarin not on medlist.  Pt was switched to Eliquis 01/01/23 per ED note and it states: Note change from coumadin to eliquis for AFib due to supratherapeutic INR and high bleeding risk. Also, it states Supratherapeutic INR: No evidence of bleeding on trauma scans.  Hemoglobin is stable. - Holding coumadin, INR allowed to decline to 1.9. Discussed risks and benefits of anticoagulation in general and specific to agents (DOAC vs. continuation of coumadin). The patient does wish to remain on anticoagulation despite fall/bleed risk. She presented with elevated INR and previously stopped eliquis due to cost. At this time she is amenable to using eliquis given the elevated INR.   Will not refill warfarin.

## 2023-03-06 ENCOUNTER — Telehealth: Payer: Self-pay | Admitting: Radiation Oncology

## 2023-03-06 NOTE — Progress Notes (Signed)
NN returned a call to the pt's daughter, Clydie Braun. NN asked Clydie Braun why the pt didn't make it to her RadOnc appt on Wednesday and Clydie Braun states "I don't know what's going on. They moved her to a room at the end of the hall and she's just lying there".  RadOnc nursing staff has reached out to [place], the Presenter, broadcasting at [facility] [phone]) and have not been able to reach her. NN already reached out to [place], there was no answer, VM left with desk number requesting a call back to get more information. NN tells Clydie Braun that this RN will continue to reach out to [facility] and make arrangements for transportation for the rescheduled Rad Onc appt.

## 2023-03-06 NOTE — Telephone Encounter (Signed)
4/29 @ 10:20 am Called and spoke to daughter after she left voicemail for f/u about her mother appointment with Dr. Roselind Messier.  I let her know we have reach out to [facility]) several times for patient to be rescheduled.  She stated she will reach out to to [place].

## 2023-03-06 NOTE — Telephone Encounter (Signed)
4/29 @ 8:52 am Left voicemail with Morrie Sheldon (Rep - Nursing Facility) for call back to reschedule patient for consult with Dr. Roselind Messier.  Waiting on call back.

## 2023-03-06 NOTE — Progress Notes (Signed)
NN reached out to [place], Presenter, broadcasting at [facility]. No answer. NN left VM requesting a call back.

## 2023-03-07 ENCOUNTER — Telehealth: Payer: Self-pay | Admitting: Radiation Oncology

## 2023-03-07 NOTE — Telephone Encounter (Signed)
4/39 @ 10:13 am called patient's daughter Clydie Braun, to update her that her mother is now reschedule with Dr. Roselind Messier for 5/8 at 7:30 am.  No answer/just rings.  Will try back later.

## 2023-03-07 NOTE — Progress Notes (Signed)
NN reached out to transportation coordination @ [address], [place]. No answer. Left VM.

## 2023-03-07 NOTE — Progress Notes (Addendum)
Thoracic Location of Tumor / Histology: Endobronchial mass   01/01/2023 CT ABDOMEN PELVIS W CONTRAST  IMPRESSION:  1. No evidence of acute traumatic injury to the chest, abdomen, or pelvis. 2. Reference thoracic and lumbar spine reformats for spinal evaluation. 3. Rounded density in the right bronchus intermedius with complete low-density bronchial filling of the right lower lobe bronchi and complete right lower lobe collapse. Acuity is uncertain but favored to be chronic. There is a 2.9 cm fluid density structure within the area of atelectatic lung in the right lower lobe. This is nonspecific. Both neoplastic or infectious etiologies are considered. Recommend bronchoscopy for further evaluation. 4. Subpleural reticulation and ground-glass in a peripheral and basilar predominant distribution, suspicious for interstitial lung disease. 5. Incidental findings in the abdomen and pelvis of hepatic steatosis and right renal scarring. Moderate-sized hiatal hernia.   Aortic Atherosclerosis (ICD10-I70.0).  NM PET Image Initial (PI) Skull Base To Thigh 02-28-23 IMPRESSION: 1. Central right lower lobe lung mass consistent with the clinical history of non-small-cell carcinoma. 2. Relatively diffuse pulmonary activity corresponding to interstitial thickening and ground-glass, progressive since 01/01/2023 CT. This could represent progressive interstitial lung disease. If the patient is currently on chemotherapy, drug toxicity would be an alternate explanation. 3. The diffuse pulmonary parenchymal hypermetabolism decreases sensitivity for pulmonary metastasis. 4. Isolated mildly enlarged mediastinal node with low-level hypermetabolism, indeterminate for metastatic versus reactive etiologies. 5. No extrathoracic hypermetabolic metastasis. 6. Incidental findings, including: Pulmonary artery enlargement suggests pulmonary arterial hypertension. Moderate hiatal hernia. Left nephrolithiasis.  Patient  presented with symptoms of This is a 89 year old female, past medical history of hypertension, atrial fibrillation on Coumadin.Patient was recently in the emergency room and admitted after a fall at the end of February to [facility] long hospital. Patient was taken for bronchoscopy after CT revealed there was endobronchial lesion   Biopsies revealed:  02-07-23 FINAL MICROSCOPIC DIAGNOSIS:   A. LUNG, RIGHT LOWER LOBE, ENDOBRONCHIAL BIOPSY:  -  Moderately differentiated squamous cell carcinoma.   Note: The case was peer reviewed by Dr. Cricket Callas who agrees with the  interpretation.    Tobacco/Marijuana/Snuff/ETOH use: Never smoked and no alcohol or drug use.  Past/Anticipated interventions by cardiothoracic surgery, if any:   02/07/2023 Dr. Elige Radon Icard Video Bronchoscopy Procedure Note Video Bronchoscopy cryotherapy, tumor debulking, lesional excision   Past/Anticipated interventions by medical oncology, if any:   Dr. Arbutus Ped on 02-08-23 ASSESSMENT: This is a very pleasant 89 years old white female with likely stage IIb (T3, N0, M0) non-small cell lung cancer, squamous cell carcinoma presented with central obstructive right middle/right lower lobe lung mass occluding the bronchus intermedius as well as the bronchus to the right lower lobe diagnosed in April 2024.   PLAN: I had a lengthy discussion with the patient and her daughter today about her current disease stage, prognosis and treatment options.  I personally and independently reviewed the scan images and discussed results with the patient and her daughter.  The patient has multiple medical problems and with her age she will not be a good candidate for surgical resection of the centrally located tumor.  I recommended for her to complete the staging workup by ordering a PET scan to rule out any other metastatic disease and for also further delineation of the central lung mass.  She is referred to Dr. Roselind Messier with radiation oncology and expected to  see him next week. I will arrange for the patient to have a follow-up appointment with me in around 4 months for restaging CT scan  of the chest after her treatment.  The patient was advised to call immediately if she has any other concerning symptoms in the interval.  The patient voices understanding of current disease status and treatment options and is in agreement with the current care plan.  Signs/Symptoms Weight changes, if any: yes, has lost about 50 lbs in 6 month period, appetite has been severely decreased Respiratory complaints, if any: daughter has noticed voice changes, odd haunting voice coming from inside of her mother. Per her daughter, statements like "help me", "I want to go home" in a very concerning voice. It is not her moms voice her daughter. Hemoptysis, if any: no Pain issues, if any:  no  SAFETY ISSUES: Prior radiation? no Pacemaker/ICD? no  Possible current pregnancy? Hysterectomy Is the patient on methotrexate? No  Current Complaints / other details:  Daughter feels prepared about schedule of radiation. Her daughter [name] reports she has had multiple falls at home and is now at [facility] in [place] for rehabilitation. She states she feels like her mom will not be strong enough to leave the facility (most likely having to stay there).

## 2023-03-08 ENCOUNTER — Telehealth: Payer: Self-pay | Admitting: Internal Medicine

## 2023-03-08 NOTE — Telephone Encounter (Signed)
Scheduled per 04/29 scheduled message, patient has been called and notified.

## 2023-03-09 ENCOUNTER — Other Ambulatory Visit (HOSPITAL_COMMUNITY): Payer: Medicare Other

## 2023-03-10 DIAGNOSIS — C3490 Malignant neoplasm of unspecified part of unspecified bronchus or lung: Secondary | ICD-10-CM | POA: Diagnosis not present

## 2023-03-10 DIAGNOSIS — J449 Chronic obstructive pulmonary disease, unspecified: Secondary | ICD-10-CM | POA: Diagnosis not present

## 2023-03-10 DIAGNOSIS — J9601 Acute respiratory failure with hypoxia: Secondary | ICD-10-CM | POA: Diagnosis not present

## 2023-03-14 ENCOUNTER — Telehealth: Payer: Self-pay

## 2023-03-14 ENCOUNTER — Telehealth: Payer: Self-pay | Admitting: Radiation Oncology

## 2023-03-14 ENCOUNTER — Encounter: Payer: Self-pay | Admitting: Radiation Oncology

## 2023-03-14 ENCOUNTER — Encounter (HOSPITAL_COMMUNITY): Payer: Self-pay

## 2023-03-14 NOTE — Telephone Encounter (Signed)
Rn called pt daughter Clydie Braun to obtain nurse evaluation information. Clydie Braun told Rn that they will need to reschedule this consult. Clydie Braun stated her sister now needs repeat knee surgery and this will happen tomorrow (she will have to take her to the hospital). Rn sent an in basket to schedulers to get this consult rescheduled.

## 2023-03-14 NOTE — Telephone Encounter (Signed)
5/7 @ 2:08 pm called and spoke to patient's daughter Clydie Braun after we received inbasket from Zerita Boers, about patient's daughter wanted to reschedule patient appt due to her sister having knee surgery and wanted to be there.  Spoke to [place] at nursing facility.  She stated transportation is book up for next available time and she will be sure someone stays with patient the hold time throughout her appt on tomorrow.  Daughter agrees and ok with keeping appt on tomorrow.

## 2023-03-14 NOTE — Telephone Encounter (Signed)
Rn attempted to call pt daughter for nurse evaluation information without success. Message left for Clydie Braun on [employer]. Rn will attempt to call back later.

## 2023-03-14 NOTE — Telephone Encounter (Signed)
Rn called pt daughter [name] to obtain nurse evaluation information. Daughter cannot be here with her mother tomorrow due to other family member having surgery tomorrow. [facility] will transport pt to our clinic tomorrow with a caregiver. Consult note routed to Dr. Roselind Messier for review.

## 2023-03-14 NOTE — Progress Notes (Signed)
Radiation Oncology         [phone] ________________________________  Initial Outpatient Consultation  Name: [name] MRN: [id number]  Date: 03/15/2023  DOB: 1932/11/02  WU:[id number], Mickle Mallory, MD (Inactive)  Icard, Rachel Bo, DO   REFERRING PHYSICIAN: Josephine Igo, DO  DIAGNOSIS: There were no encounter diagnoses.  Stage IIB Moderately differentiated squamous cell carcinoma of the right lung - endobrachial mass    Cancer Staging  Stage II squamous cell carcinoma of right lung (HCC) Staging form: Lung, AJCC 8th Edition - Clinical: Stage IIB (cT3, cN0, cM0) - Signed by Si Gaul, MD on 02/08/2023  HISTORY OF PRESENT ILLNESS::[name] is a 89 y.o. female who is accompanied by ***. she is seen as a courtesy of Dr. Tonia Brooms for an opinion concerning radiation therapy as part of management for her recently diagnosed right lung cancer.  The patient was hospitalized from 01/01/23 through 01/07/23 after falling at her SNF several days prior. Head CT and C-spine performed in the ED on presentation were negative for any acute findings.  CT CAP was negative for any acute trauma, however a rounded density in the right bronchus intermedius was appreciated, with complete density bronchial filling of the right lower lobe bronchi and complete right lower lobe collapse. A 2.9 cm fluid density structure within the area of atelectatic lung in the right lower lobe was also appreciated.  Pulmonology was consulted and she underwent a bronchoscopy on 01/06/23. Lavage and brushings of the right bronchus intermedius collected showed no evidence of malignancy with benign findings. However, exam findings were notable for a visible tumor in the airway.  Other imaging performed while inpatient includes:  -- CT scan of the thoracic spine on 01/01/23 which showed acute generalized compression fracture at T12 with vertebral body 30% height loss and 3 mm posterior superior retropulsion.  Chronic compression fractures of T10 and T8 along with interval kyphoplasty were also appreciated, as well as chronic compression fractures of T1, T2, T3 and T4, and chronic T12-L1 left paracentral calcified disc extrusion with cranial extension.  -- CT scan of the lumbar spine on 02/25 also revealed an increased compression fracture at L3 with 50% of anterior and central vertebral body height loss and 30% loss of the posterior vertebral body height with posterior superior L3 retropulsion, causing moderate spinal canal stenosis.   After being discharged, the patient was accordingly referred to Dr. Tonia Brooms who recommended proceeding with repeat bronchoscopy.   The patient opted to proceed with a repeat bronchoscopy on 02/07/23 under the care of Dr. Tonia Brooms. Endobrachial/right lower lobe biopsy collected showed findings consistent with moderately differentiated squamous cell carcinoma. Procedural findings showed occlusion of the right lower lobe and bronchus intermedius by tumor. There was also visible tumor growing from the lateral wall of the bronchus intermedius which was seen to occlude the entire airway. She also underwent cryotherapy, tumor debulking and lesional excision at this time.   Foundation one PD-L1 analysis was ordered and showed a TPS score of 1%; MS-stable.   Accordingly, the patient was referred to Dr. Arbutus Ped on 02/08/23 to discuss treatment options. During this visit, the patient reported feeling fine with no concerning complaints except for the baseline fatigue and bilateral LE weakness.   Moving forward, Dr. Arbutus Ped recommends staging work-up including a PET scan to rule out any other metastatic disease and also for further delineation of the central lung mass.   PET scan on 02/28/23 demonstrates: the central right lower lobe mass, consistent with non-small cell  carcinoma, measuring 5.7 x 4.6 cm and with an a S.U.V. max of 23.5; progressive relatively diffuse pulmonary activity  corresponding to interstitial thickening and ground-glass; and an isolated mildly enlarged mediastinal node with low-level hypermetabolism, indeterminate for metastatic versus reactive etiologies. PET otherwise showed no extrathoracic hypermetabolic metastasis. Of note: the diffuse pulmonary parenchymal hypermetabolism decreased screening sensitivity for pulmonary metastasis on this study.   Her case was also recently presented at the tumor board on 02/16/23.   The patient has multiple medical comorbidities, and with her age, she is not a good candidate for surgical resection of the centrally located tumor.   -- (Comorbidities include CAD, permanent a-fib on anticoagulation with Coumadin, mild to moderate MR, mild to moderate TR, hypertension, LVH, hyperlipidemia, pulmonary hypertension, arthritis, chronic back pain, depression, COPD, chronic bronchitis, PAD, GERD, prediabetes, urge incontinence, macular degeneration, B12 deficiency as well as other comorbidities).    PREVIOUS RADIATION THERAPY: No  PAST MEDICAL HISTORY:  Past Medical History:  Diagnosis Date   Arthritis    Back pain, chronic    Coronary artery disease CARDIOLOGIST- DR [place]--  LOV IN EPIC   Depression    Hammer toe    Hypercholesterolemia    Hyperlipidemia    Hypertension    Hypertensive heart disease    WITH LVH   LVH (left ventricular hypertrophy) due to hypertensive disease    Macular degeneration    Mitral insufficiency    CHRONIC   Mitral regurgitation    Permanent atrial fibrillation (HCC)    Pneumonia    Pulmonary hypertension (HCC)    Recurrent falls    Transfusion history     PAST SURGICAL HISTORY: Past Surgical History:  Procedure Laterality Date   ABDOMINAL AORTOGRAM W/LOWER EXTREMITY N/A 04/29/2020   Procedure: ABDOMINAL AORTOGRAM W/LOWER EXTREMITY;  Surgeon: Iran Ouch, MD;  Location: [facility];  Service: Cardiovascular;  Laterality: N/A;  Lt Leg   ABDOMINAL HYSTERECTOMY      ANTERIOR REPAIR/ [facility] PUBOVAGINAL SLING  05-03-2002   BRONCHIAL BRUSHINGS  01/06/2023   Procedure: BRONCHIAL BRUSHINGS;  Surgeon: Karren Burly, MD;  Location: [facility];  Service: Endoscopy;;   BRONCHIAL WASHINGS  01/06/2023   Procedure: BRONCHIAL WASHINGS;  Surgeon: Karren Burly, MD;  Location: [facility];  Service: Endoscopy;;   BUNIONECTOMY WITH HAMMERTOE RECONSTRUCTION Right 03/01/2013   Procedure: RIGHT FOOT EXCISION BUNIONETTE AND FUSION OF DIP FOURTH TOE;  Surgeon: Drucilla Schmidt, MD;  Location: [facility];  Service: Orthopedics;  Laterality: Right;   CARDIAC CATHETERIZATION  02-03-2009  DR  [place]   SINGLE-VESSEL OBSTRUCTIVE CAD, SMALL DIAGONAL BRANCH/ NORMAL LVF/ SEVERE MITRAL INSUFFICIENCY/ SEVERE PULMONARY HYPERTENSION   CATARACT EXTRACTION W/ INTRAOCULAR LENS  IMPLANT, BILATERAL  2003   COLONOSCOPY  07/25/2014   few mall scattered diverticula in the ectire examined colon. Small internal hemmorhoids.    CRYOTHERAPY  02/07/2023   Procedure: CRYOTHERAPY;  Surgeon: Josephine Igo, DO;  Location: [facility];  Service: Cardiopulmonary;;   HAMMER TOE SURGERY  02/28/2012   Procedure: HAMMER TOE CORRECTION;  Surgeon: Drucilla Schmidt, MD;  Location:  SURGERY CENTER;  Service: Orthopedics;  Laterality: Right;  FUSION OF PROXIMAL INTERPHALANGEAL  RIGHT THIRD CLAW TOE   HEMOSTASIS CONTROL  01/06/2023   Procedure: HEMOSTASIS CONTROL;  Surgeon: Karren Burly, MD;  Location: [facility];  Service: Endoscopy;;   HEMOSTASIS CONTROL  02/07/2023   Procedure: HEMOSTASIS CONTROL;  Surgeon: Josephine Igo, DO;  Location: [facility];  Service: Cardiopulmonary;;  PARS PLANA VITRECTOMY W/ REPAIR OF MACULAR HOLE  02-04-2002   LEFT EYE   PERIPHERAL VASCULAR BALLOON ANGIOPLASTY  04/29/2020   Procedure: PERIPHERAL VASCULAR BALLOON ANGIOPLASTY;  Surgeon: Iran Ouch, MD;  Location: [facility];  Service: Cardiovascular;;  Lt. AT   REPAIR  RIGHT SECOND CLAW TOE/ VARUS MEDIAL CLOSING WEDGE OSTEOTOMY PROXIMAL PHALANX RIGHT GREAT TOE  05-05-2005   TOTAL HIP ARTHROPLASTY     BILATERAL---  LEFT 4/98;   RIGHT  11/98   VARUS OSTEOTOMY PROXIMAL PHALANX, LEFT GREAT TOE/ PARING DOWN THE CALLUS , SECOND LEFT TOE  10-27-2008   VIDEO BRONCHOSCOPY N/A 01/06/2023   Procedure: VIDEO BRONCHOSCOPY WITHOUT FLUORO;  Surgeon: Karren Burly, MD;  Location: [facility];  Service: Endoscopy;  Laterality: N/A;   VIDEO BRONCHOSCOPY Right 02/07/2023   Procedure: VIDEO BRONCHOSCOPY WITHOUT FLUORO;  Surgeon: Josephine Igo, DO;  Location: [facility];  Service: Cardiopulmonary;  Laterality: Right;  possible cryotherapy    FAMILY HISTORY:  Family History  Problem Relation Age of Onset   Heart disease Mother 74   Prostate cancer Father 11    SOCIAL HISTORY:  Social History   Tobacco Use   Smoking status: Never   Smokeless tobacco: Never  Vaping Use   Vaping Use: Never used  Substance Use Topics   Alcohol use: Not Currently    Alcohol/week: 1.0 standard drink of alcohol    Types: 1 Glasses of wine per week    Comment: occasional   Drug use: Not Currently    ALLERGIES:  Allergies  Allergen Reactions   Sulfamethoxazole    Sulfanilamide     unkn Other reaction(s): Other (See Comments) unkn   Sulfur Dioxide     Other reaction(s): Other (See Comments)   Penicillins Rash    MEDICATIONS:  Current Outpatient Medications  Medication Sig Dispense Refill   albuterol (PROVENTIL) (2.5 MG/3ML) 0.083% nebulizer solution Take by nebulization.     apixaban (ELIQUIS) 5 MG TABS tablet Take 1 tablet (5 mg total) by mouth 2 (two) times daily. 60 tablet    ASCORBIC ACID PO Take 600 mg by mouth daily.     atorvastatin (LIPITOR) 80 MG tablet Take 1 tablet (80 mg total) by mouth daily. 90 tablet 3   Biotin 1000 MCG tablet Take 1,000 mcg by mouth daily.     Calcium Carbonate-Vitamin D (CALCIUM + D PO) Take 1 tablet by mouth daily.     diltiazem  (CARDIZEM CD) 120 MG 24 hr capsule Take 120 mg by mouth daily.     doxycycline (VIBRA-TABS) 100 MG tablet Take 1 tablet (100 mg total) by mouth 2 (two) times daily. 14 tablet 0   folic acid (FOLVITE) 1 MG tablet Take 1 mg by mouth daily.     gabapentin (NEURONTIN) 300 MG capsule Take 300 mg by mouth 2 (two) times daily.     lisinopril (ZESTRIL) 20 MG tablet Take 1 tablet (20 mg total) by mouth daily. 90 tablet 2   loperamide (IMODIUM A-D) 2 MG tablet Take 2 mg by mouth every 6 (six) hours as needed for diarrhea or loose stools.     pantoprazole (PROTONIX) 40 MG tablet Take 40 mg by mouth daily.     Propylene Glycol-Glycerin (SOOTHE) 0.6-0.6 % SOLN Place 2 drops into both eyes daily as needed (Dry eye).     sertraline (ZOLOFT) 100 MG tablet Take 1 tablet by mouth daily. 90 tablet 0   vitamin A 8000 UNIT capsule Take 8,000  Units by mouth daily.     No current facility-administered medications for this encounter.    REVIEW OF SYSTEMS:  A 10+ POINT REVIEW OF SYSTEMS WAS OBTAINED including neurology, dermatology, psychiatry, cardiac, respiratory, lymph, extremities, GI, GU, musculoskeletal, constitutional, reproductive, HEENT. ***   PHYSICAL EXAM:  vitals were not taken for this visit.   General: Alert and oriented, in no acute distress HEENT: Head is normocephalic. Extraocular movements are intact. Oropharynx is clear. Neck: Neck is supple, no palpable cervical or supraclavicular lymphadenopathy. Heart: Regular in rate and rhythm with no murmurs, rubs, or gallops. Chest: Clear to auscultation bilaterally, with no rhonchi, wheezes, or rales. Abdomen: Soft, nontender, nondistended, with no rigidity or guarding. Extremities: No cyanosis or edema. Lymphatics: see Neck Exam Skin: No concerning lesions. Musculoskeletal: symmetric strength and muscle tone throughout. Neurologic: Cranial nerves II through XII are grossly intact. No obvious focalities. Speech is fluent. Coordination is  intact. Psychiatric: Judgment and insight are intact. Affect is appropriate. ***  ECOG = ***  0 - Asymptomatic (Fully active, able to carry on all predisease activities without restriction)  1 - Symptomatic but completely ambulatory (Restricted in physically strenuous activity but ambulatory and able to carry out work of a light or sedentary nature. For example, light housework, office work)  2 - Symptomatic, <50% in bed during the day (Ambulatory and capable of all self care but unable to carry out any work activities. Up and about more than 50% of waking hours)  3 - Symptomatic, >50% in bed, but not bedbound (Capable of only limited self-care, confined to bed or chair 50% or more of waking hours)  4 - Bedbound (Completely disabled. Cannot carry on any self-care. Totally confined to bed or chair)  5 - Death   [name] MM, Creech RH, Tormey DC, et al. [phone]). "Toxicity and response criteria of the [facility] Group". Am. Evlyn Clines. Oncol. 5 (6): 649-55  LABORATORY DATA:  Lab Results  Component Value Date   WBC 9.9 02/08/2023   HGB 10.2 (L) 02/08/2023   HCT 33.8 (L) 02/08/2023   MCV 88.0 02/08/2023   PLT 416 (H) 02/08/2023   NEUTROABS 7.9 (H) 02/08/2023   Lab Results  Component Value Date   NA 139 02/08/2023   K 3.5 02/08/2023   CL 102 02/08/2023   CO2 32 02/08/2023   GLUCOSE 109 (H) 02/08/2023   BUN 11 02/08/2023   CREATININE 0.63 02/08/2023   CALCIUM 9.6 02/08/2023      RADIOGRAPHY: NM PET Image Initial (PI) Skull Base To Thigh  Result Date: 03/02/2023 CLINICAL DATA:  Subsequent treatment strategy for non-small-cell lung cancer. Staging. EXAM: NUCLEAR MEDICINE PET SKULL BASE TO THIGH TECHNIQUE: 5.6 mCi F-18 FDG was injected intravenously. Full-ring PET imaging was performed from the skull base to thigh after the radiotracer. CT data was obtained and used for attenuation correction and anatomic localization. Fasting blood glucose: 103 mg/dl COMPARISON:   62/13/0865 chest abdomen and pelvic CTs. FINDINGS: Mediastinal blood pool activity: SUV max 2.3 Liver activity: SUV max NA NECK: No areas of abnormal hypermetabolism. Incidental CT findings: No cervical adenopathy. CHEST: Low right paratracheal/precarinal node measures 1.1 cm and a S.U.V. max of 3.8 on 46/4. Low-level hypermetabolism corresponding to diffuse, upper lobe predominant ground-glass and interstitial thickening, new or progressive since 01/01/2023. Hypermetabolic central right lower lobe lung mass with central cavitation. On the order of 5.7 x 4.6 cm and a S.U.V. max of 23.5 on 53/4. This causes right lower lobe endobronchial obstruction, as  on diagnostic CT. More peripheral right lower lobe demonstrates improved aeration with residual airspace disease and presumably postobstructive pneumonitis at a S.U.V. max of 4.9 on 57/4. Incidental CT findings: Cardiomegaly. Aortic and coronary artery calcification. Aortic valve calcification, nonspecific for age. Pulmonary artery enlargement, outflow tract 3.3 cm. Moderate hiatal hernia. ABDOMEN/PELVIS: No abdominopelvic parenchymal or nodal hypermetabolism. Incidental CT findings: Normal adrenal glands. Internal lower pole right renal atrophy. Upper pole left renal collecting system calculus. Advanced abdominal aortic atherosclerosis. Degraded evaluation of the pelvis, secondary to beam hardening artifact from bilateral hip arthroplasty. SKELETON: Bilateral psoas muscular hypermetabolism is likely due to motion after radiopharmaceutical injection. No abnormal marrow hypermetabolism. Incidental CT findings: Osteopenia. Thoracolumbar compression deformities and prior vertebral augmentation, suboptimally evaluated. IMPRESSION: 1. Central right lower lobe lung mass consistent with the clinical history of non-small-cell carcinoma. 2. Relatively diffuse pulmonary activity corresponding to interstitial thickening and ground-glass, progressive since 01/01/2023 CT. This  could represent progressive interstitial lung disease. If the patient is currently on chemotherapy, drug toxicity would be an alternate explanation. 3. The diffuse pulmonary parenchymal hypermetabolism decreases sensitivity for pulmonary metastasis. 4. Isolated mildly enlarged mediastinal node with low-level hypermetabolism, indeterminate for metastatic versus reactive etiologies. 5. No extrathoracic hypermetabolic metastasis. 6. Incidental findings, including: Pulmonary artery enlargement suggests pulmonary arterial hypertension. Moderate hiatal hernia. Left nephrolithiasis. Electronically Signed   By: Jeronimo Greaves M.D.   On: 03/02/2023 15:57      IMPRESSION: Stage IIB Moderately differentiated squamous cell carcinoma of the right lung - endobrachial mass   ***  Today, I talked to the patient and family about the findings and work-up thus far.  We discussed the natural history of *** and general treatment, highlighting the role of radiotherapy in the management.  We discussed the available radiation techniques, and focused on the details of logistics and delivery.  We reviewed the anticipated acute and late sequelae associated with radiation in this setting.  The patient was encouraged to ask questions that I answered to the best of my ability. *** A patient consent form was discussed and signed.  We retained a copy for our records.  The patient would like to proceed with radiation and will be scheduled for CT simulation.  PLAN: ***    *** minutes of total time was spent for this patient encounter, including preparation, face-to-face counseling with the patient and coordination of care, physical exam, and documentation of the encounter.   ------------------------------------------------  Billie Lade, PhD, MD  This document serves as a record of services personally performed by Antony Blackbird, MD. It was created on his behalf by Neena Rhymes, a trained medical scribe. The creation of this record  is based on the scribe's personal observations and the provider's statements to them. This document has been checked and approved by the attending provider.

## 2023-03-15 ENCOUNTER — Ambulatory Visit
Admission: RE | Admit: 2023-03-15 | Discharge: 2023-03-15 | Disposition: A | Discharge status: Home / Self Care | Payer: Medicare Other | Source: Ambulatory Visit | Attending: Radiation Oncology | Admitting: Radiation Oncology

## 2023-03-15 ENCOUNTER — Other Ambulatory Visit: Payer: Self-pay

## 2023-03-15 VITALS — BP 145/74 | HR 99 | Temp 97.3°F | Resp 20

## 2023-03-15 DIAGNOSIS — M4856XA Collapsed vertebra, not elsewhere classified, lumbar region, initial encounter for fracture: Secondary | ICD-10-CM | POA: Diagnosis not present

## 2023-03-15 DIAGNOSIS — Z7901 Long term (current) use of anticoagulants: Secondary | ICD-10-CM | POA: Insufficient documentation

## 2023-03-15 DIAGNOSIS — I272 Pulmonary hypertension, unspecified: Secondary | ICD-10-CM | POA: Diagnosis not present

## 2023-03-15 DIAGNOSIS — N200 Calculus of kidney: Secondary | ICD-10-CM | POA: Insufficient documentation

## 2023-03-15 DIAGNOSIS — J449 Chronic obstructive pulmonary disease, unspecified: Secondary | ICD-10-CM | POA: Diagnosis not present

## 2023-03-15 DIAGNOSIS — M48061 Spinal stenosis, lumbar region without neurogenic claudication: Secondary | ICD-10-CM | POA: Diagnosis not present

## 2023-03-15 DIAGNOSIS — M4854XA Collapsed vertebra, not elsewhere classified, thoracic region, initial encounter for fracture: Secondary | ICD-10-CM | POA: Diagnosis not present

## 2023-03-15 DIAGNOSIS — Z79899 Other long term (current) drug therapy: Secondary | ICD-10-CM | POA: Diagnosis not present

## 2023-03-15 DIAGNOSIS — Z8042 Family history of malignant neoplasm of prostate: Secondary | ICD-10-CM | POA: Insufficient documentation

## 2023-03-15 DIAGNOSIS — E538 Deficiency of other specified B group vitamins: Secondary | ICD-10-CM | POA: Diagnosis not present

## 2023-03-15 DIAGNOSIS — I119 Hypertensive heart disease without heart failure: Secondary | ICD-10-CM | POA: Diagnosis not present

## 2023-03-15 DIAGNOSIS — E7800 Pure hypercholesterolemia, unspecified: Secondary | ICD-10-CM | POA: Insufficient documentation

## 2023-03-15 DIAGNOSIS — N3941 Urge incontinence: Secondary | ICD-10-CM | POA: Insufficient documentation

## 2023-03-15 DIAGNOSIS — C3491 Malignant neoplasm of unspecified part of right bronchus or lung: Secondary | ICD-10-CM

## 2023-03-15 DIAGNOSIS — I4821 Permanent atrial fibrillation: Secondary | ICD-10-CM | POA: Insufficient documentation

## 2023-03-15 DIAGNOSIS — G8929 Other chronic pain: Secondary | ICD-10-CM | POA: Insufficient documentation

## 2023-03-15 DIAGNOSIS — C3431 Malignant neoplasm of lower lobe, right bronchus or lung: Secondary | ICD-10-CM | POA: Diagnosis not present

## 2023-03-15 DIAGNOSIS — K219 Gastro-esophageal reflux disease without esophagitis: Secondary | ICD-10-CM | POA: Insufficient documentation

## 2023-03-15 DIAGNOSIS — K449 Diaphragmatic hernia without obstruction or gangrene: Secondary | ICD-10-CM | POA: Insufficient documentation

## 2023-03-15 DIAGNOSIS — I2510 Atherosclerotic heart disease of native coronary artery without angina pectoris: Secondary | ICD-10-CM | POA: Insufficient documentation

## 2023-03-15 DIAGNOSIS — I700 Atherosclerosis of aorta: Secondary | ICD-10-CM | POA: Insufficient documentation

## 2023-03-20 ENCOUNTER — Other Ambulatory Visit: Payer: Self-pay

## 2023-03-20 MED ORDER — LISINOPRIL 20 MG PO TABS: 20.0000 mg | ORAL_TABLET | Freq: Every day | ORAL | 0 refills | Status: AC

## 2023-03-21 DIAGNOSIS — C3490 Malignant neoplasm of unspecified part of unspecified bronchus or lung: Secondary | ICD-10-CM | POA: Diagnosis not present

## 2023-03-21 DIAGNOSIS — I10 Essential (primary) hypertension: Secondary | ICD-10-CM | POA: Diagnosis not present

## 2023-03-21 DIAGNOSIS — I4821 Permanent atrial fibrillation: Secondary | ICD-10-CM | POA: Diagnosis not present

## 2023-03-21 DIAGNOSIS — Z515 Encounter for palliative care: Secondary | ICD-10-CM | POA: Diagnosis not present

## 2023-03-23 ENCOUNTER — Other Ambulatory Visit: Payer: Self-pay

## 2023-04-08 DEATH — deceased

## 2023-07-12 ENCOUNTER — Inpatient Hospital Stay: Payer: Self-pay | Admitting: Internal Medicine

## 2023-07-12 ENCOUNTER — Inpatient Hospital Stay: Payer: Self-pay
